# Patient Record
Sex: Female | Born: 1940 | Race: White | Hispanic: No | Marital: Married | Smoking: Former smoker
Health system: Southern US, Community
[De-identification: ages and names within clinical notes are randomized; demographics above are authoritative.]

## PROBLEM LIST (undated history)

## (undated) DIAGNOSIS — N289 Disorder of kidney and ureter, unspecified: Secondary | ICD-10-CM

## (undated) DIAGNOSIS — I509 Heart failure, unspecified: Secondary | ICD-10-CM

## (undated) DIAGNOSIS — L0390 Cellulitis, unspecified: Secondary | ICD-10-CM

## (undated) DIAGNOSIS — F329 Major depressive disorder, single episode, unspecified: Secondary | ICD-10-CM

## (undated) DIAGNOSIS — J189 Pneumonia, unspecified organism: Secondary | ICD-10-CM

## (undated) DIAGNOSIS — E611 Iron deficiency: Secondary | ICD-10-CM

## (undated) DIAGNOSIS — N183 Chronic kidney disease, stage 3 unspecified: Secondary | ICD-10-CM

## (undated) DIAGNOSIS — I209 Angina pectoris, unspecified: Secondary | ICD-10-CM

## (undated) DIAGNOSIS — R202 Paresthesia of skin: Secondary | ICD-10-CM

## (undated) DIAGNOSIS — R200 Anesthesia of skin: Secondary | ICD-10-CM

## (undated) DIAGNOSIS — Z936 Other artificial openings of urinary tract status: Secondary | ICD-10-CM

## (undated) DIAGNOSIS — R2681 Unsteadiness on feet: Secondary | ICD-10-CM

## (undated) DIAGNOSIS — R7881 Bacteremia: Secondary | ICD-10-CM

## (undated) DIAGNOSIS — D649 Anemia, unspecified: Secondary | ICD-10-CM

## (undated) DIAGNOSIS — N1330 Unspecified hydronephrosis: Secondary | ICD-10-CM

## (undated) DIAGNOSIS — R079 Chest pain, unspecified: Secondary | ICD-10-CM

## (undated) DIAGNOSIS — G8929 Other chronic pain: Secondary | ICD-10-CM

## (undated) DIAGNOSIS — R471 Dysarthria and anarthria: Secondary | ICD-10-CM

## (undated) DIAGNOSIS — E872 Acidosis, unspecified: Secondary | ICD-10-CM

## (undated) DIAGNOSIS — M1990 Unspecified osteoarthritis, unspecified site: Secondary | ICD-10-CM

## (undated) DIAGNOSIS — R4182 Altered mental status, unspecified: Secondary | ICD-10-CM

## (undated) DIAGNOSIS — R51 Headache: Secondary | ICD-10-CM

## (undated) DIAGNOSIS — I770 Arteriovenous fistula, acquired: Secondary | ICD-10-CM

## (undated) DIAGNOSIS — F0390 Unspecified dementia without behavioral disturbance: Secondary | ICD-10-CM

## (undated) DIAGNOSIS — K219 Gastro-esophageal reflux disease without esophagitis: Secondary | ICD-10-CM

## (undated) DIAGNOSIS — I10 Essential (primary) hypertension: Secondary | ICD-10-CM

## (undated) DIAGNOSIS — R296 Repeated falls: Secondary | ICD-10-CM

## (undated) DIAGNOSIS — R011 Cardiac murmur, unspecified: Secondary | ICD-10-CM

## (undated) DIAGNOSIS — I219 Acute myocardial infarction, unspecified: Secondary | ICD-10-CM

## (undated) DIAGNOSIS — F419 Anxiety disorder, unspecified: Secondary | ICD-10-CM

## (undated) DIAGNOSIS — R609 Edema, unspecified: Secondary | ICD-10-CM

## (undated) DIAGNOSIS — N2581 Secondary hyperparathyroidism of renal origin: Secondary | ICD-10-CM

## (undated) DIAGNOSIS — I499 Cardiac arrhythmia, unspecified: Secondary | ICD-10-CM

## (undated) DIAGNOSIS — N390 Urinary tract infection, site not specified: Secondary | ICD-10-CM

## (undated) DIAGNOSIS — Z8619 Personal history of other infectious and parasitic diseases: Secondary | ICD-10-CM

## (undated) DIAGNOSIS — Z905 Acquired absence of kidney: Secondary | ICD-10-CM

## (undated) DIAGNOSIS — M359 Systemic involvement of connective tissue, unspecified: Secondary | ICD-10-CM

## (undated) DIAGNOSIS — F32A Depression, unspecified: Secondary | ICD-10-CM

## (undated) HISTORY — PX: ABDOMINAL HYSTERECTOMY: SHX81

## (undated) HISTORY — PX: FRACTURE SURGERY: SHX138

## (undated) HISTORY — DX: Dysarthria and anarthria: R47.1

## (undated) HISTORY — PX: HIP ARTHROPLASTY: SHX981

## (undated) HISTORY — PX: TONSILLECTOMY AND ADENOIDECTOMY: SUR1326

## (undated) HISTORY — DX: Cellulitis, unspecified: L03.90

## (undated) HISTORY — DX: Arteriovenous fistula, acquired: I77.0

## (undated) HISTORY — DX: Acidosis: E87.2

## (undated) HISTORY — DX: Iron deficiency: E61.1

## (undated) HISTORY — DX: Gastro-esophageal reflux disease without esophagitis: K21.9

## (undated) HISTORY — DX: Altered mental status, unspecified: R41.82

## (undated) HISTORY — PX: DILATION AND CURETTAGE OF UTERUS: SHX78

## (undated) HISTORY — DX: Secondary hyperparathyroidism of renal origin: N25.81

## (undated) HISTORY — DX: Acidosis, unspecified: E87.20

## (undated) HISTORY — DX: Unspecified hydronephrosis: N13.30

## (undated) HISTORY — DX: Personal history of other infectious and parasitic diseases: Z86.19

## (undated) HISTORY — DX: Heart failure, unspecified: I50.9

## (undated) HISTORY — DX: Edema, unspecified: R60.9

## (undated) HISTORY — DX: Unsteadiness on feet: R26.81

## (undated) HISTORY — DX: Bacteremia: R78.81

## (undated) HISTORY — DX: Anemia, unspecified: D64.9

## (undated) HISTORY — DX: Urinary tract infection, site not specified: N39.0

## (undated) HISTORY — DX: Essential (primary) hypertension: I10

---

## 1968-06-25 HISTORY — PX: PARTIAL COLECTOMY: SHX5273

## 1969-02-23 HISTORY — PX: AV FISTULA PLACEMENT: SHX1204

## 1971-06-26 HISTORY — PX: CREATION / REVISION OF ILEOSTOMY / JEJUNOSTOMY: SUR354

## 1971-06-26 HISTORY — PX: NEPHRECTOMY: SHX65

## 1972-06-25 HISTORY — PX: BLADDER REMOVAL: SHX567

## 1998-06-25 HISTORY — PX: CHOLECYSTECTOMY: SHX55

## 1998-06-25 HISTORY — PX: APPENDECTOMY: SHX54

## 1999-02-23 ENCOUNTER — Ambulatory Visit (HOSPITAL_COMMUNITY): Admission: RE | Admit: 1999-02-23 | Discharge: 1999-02-23 | Payer: Self-pay | Admitting: Neurology

## 2001-07-31 ENCOUNTER — Inpatient Hospital Stay (HOSPITAL_COMMUNITY): Admission: EM | Admit: 2001-07-31 | Discharge: 2001-08-05 | Payer: Self-pay | Admitting: Emergency Medicine

## 2001-07-31 ENCOUNTER — Encounter: Payer: Self-pay | Admitting: Emergency Medicine

## 2001-08-05 ENCOUNTER — Encounter (INDEPENDENT_AMBULATORY_CARE_PROVIDER_SITE_OTHER): Payer: Self-pay | Admitting: Internal Medicine

## 2001-08-22 ENCOUNTER — Inpatient Hospital Stay (HOSPITAL_COMMUNITY): Admission: AD | Admit: 2001-08-22 | Discharge: 2001-08-27 | Payer: Self-pay | Admitting: Internal Medicine

## 2001-08-22 ENCOUNTER — Encounter (INDEPENDENT_AMBULATORY_CARE_PROVIDER_SITE_OTHER): Payer: Self-pay | Admitting: Internal Medicine

## 2001-09-08 ENCOUNTER — Inpatient Hospital Stay (HOSPITAL_COMMUNITY): Admission: EM | Admit: 2001-09-08 | Discharge: 2001-09-11 | Payer: Self-pay | Admitting: *Deleted

## 2001-11-02 ENCOUNTER — Encounter (INDEPENDENT_AMBULATORY_CARE_PROVIDER_SITE_OTHER): Payer: Self-pay | Admitting: Internal Medicine

## 2001-11-02 ENCOUNTER — Inpatient Hospital Stay (HOSPITAL_COMMUNITY): Admission: EM | Admit: 2001-11-02 | Discharge: 2001-11-04 | Payer: Self-pay | Admitting: Internal Medicine

## 2002-06-11 ENCOUNTER — Encounter (INDEPENDENT_AMBULATORY_CARE_PROVIDER_SITE_OTHER): Payer: Self-pay | Admitting: Internal Medicine

## 2002-06-11 ENCOUNTER — Ambulatory Visit (HOSPITAL_COMMUNITY): Admission: RE | Admit: 2002-06-11 | Discharge: 2002-06-11 | Payer: Self-pay | Admitting: Internal Medicine

## 2002-06-12 ENCOUNTER — Emergency Department (HOSPITAL_COMMUNITY): Admission: EM | Admit: 2002-06-12 | Discharge: 2002-06-12 | Payer: Self-pay | Admitting: Emergency Medicine

## 2002-09-18 ENCOUNTER — Inpatient Hospital Stay (HOSPITAL_COMMUNITY): Admission: EM | Admit: 2002-09-18 | Discharge: 2002-09-21 | Payer: Self-pay | Admitting: Internal Medicine

## 2002-09-18 ENCOUNTER — Encounter: Payer: Self-pay | Admitting: Internal Medicine

## 2002-09-21 ENCOUNTER — Encounter (INDEPENDENT_AMBULATORY_CARE_PROVIDER_SITE_OTHER): Payer: Self-pay | Admitting: Internal Medicine

## 2002-12-30 ENCOUNTER — Encounter: Payer: Self-pay | Admitting: Internal Medicine

## 2002-12-30 ENCOUNTER — Inpatient Hospital Stay (HOSPITAL_COMMUNITY): Admission: EM | Admit: 2002-12-30 | Discharge: 2003-01-02 | Payer: Self-pay | Admitting: Internal Medicine

## 2002-12-31 ENCOUNTER — Encounter (INDEPENDENT_AMBULATORY_CARE_PROVIDER_SITE_OTHER): Payer: Self-pay | Admitting: Internal Medicine

## 2003-01-01 ENCOUNTER — Encounter (INDEPENDENT_AMBULATORY_CARE_PROVIDER_SITE_OTHER): Payer: Self-pay | Admitting: Internal Medicine

## 2003-01-12 ENCOUNTER — Ambulatory Visit (HOSPITAL_COMMUNITY): Admission: RE | Admit: 2003-01-12 | Discharge: 2003-01-12 | Payer: Self-pay | Admitting: Urology

## 2003-01-12 ENCOUNTER — Encounter: Payer: Self-pay | Admitting: Urology

## 2003-04-11 ENCOUNTER — Encounter: Payer: Self-pay | Admitting: Emergency Medicine

## 2003-04-11 ENCOUNTER — Inpatient Hospital Stay (HOSPITAL_COMMUNITY): Admission: EM | Admit: 2003-04-11 | Discharge: 2003-04-17 | Payer: Self-pay | Admitting: Emergency Medicine

## 2004-04-04 ENCOUNTER — Inpatient Hospital Stay (HOSPITAL_COMMUNITY): Admission: EM | Admit: 2004-04-04 | Discharge: 2004-04-05 | Payer: Self-pay | Admitting: Emergency Medicine

## 2004-05-21 ENCOUNTER — Ambulatory Visit: Payer: Self-pay | Admitting: Internal Medicine

## 2004-05-21 ENCOUNTER — Inpatient Hospital Stay (HOSPITAL_COMMUNITY): Admission: EM | Admit: 2004-05-21 | Discharge: 2004-05-24 | Payer: Self-pay | Admitting: Emergency Medicine

## 2004-05-29 ENCOUNTER — Ambulatory Visit: Payer: Self-pay | Admitting: Internal Medicine

## 2004-08-10 ENCOUNTER — Ambulatory Visit: Payer: Self-pay | Admitting: Internal Medicine

## 2004-10-15 ENCOUNTER — Inpatient Hospital Stay (HOSPITAL_COMMUNITY): Admission: EM | Admit: 2004-10-15 | Discharge: 2004-10-17 | Payer: Self-pay | Admitting: Emergency Medicine

## 2004-10-15 ENCOUNTER — Ambulatory Visit: Payer: Self-pay | Admitting: Internal Medicine

## 2004-10-17 ENCOUNTER — Ambulatory Visit: Payer: Self-pay | Admitting: Gastroenterology

## 2004-11-22 ENCOUNTER — Ambulatory Visit: Payer: Self-pay | Admitting: Internal Medicine

## 2005-02-15 ENCOUNTER — Emergency Department (HOSPITAL_COMMUNITY): Admission: EM | Admit: 2005-02-15 | Discharge: 2005-02-15 | Payer: Self-pay | Admitting: Emergency Medicine

## 2005-02-22 ENCOUNTER — Ambulatory Visit: Payer: Self-pay | Admitting: Internal Medicine

## 2005-05-03 ENCOUNTER — Ambulatory Visit: Payer: Self-pay | Admitting: Internal Medicine

## 2005-06-04 ENCOUNTER — Ambulatory Visit: Payer: Self-pay | Admitting: Internal Medicine

## 2005-09-10 ENCOUNTER — Ambulatory Visit: Payer: Self-pay | Admitting: Internal Medicine

## 2005-12-13 ENCOUNTER — Ambulatory Visit: Payer: Self-pay | Admitting: Internal Medicine

## 2006-01-21 ENCOUNTER — Emergency Department (HOSPITAL_COMMUNITY): Admission: EM | Admit: 2006-01-21 | Discharge: 2006-01-21 | Payer: Self-pay | Admitting: Emergency Medicine

## 2006-02-28 ENCOUNTER — Emergency Department (HOSPITAL_COMMUNITY): Admission: EM | Admit: 2006-02-28 | Discharge: 2006-02-28 | Payer: Self-pay | Admitting: Emergency Medicine

## 2006-03-04 ENCOUNTER — Ambulatory Visit: Payer: Self-pay | Admitting: Internal Medicine

## 2006-03-05 ENCOUNTER — Ambulatory Visit: Payer: Self-pay | Admitting: Internal Medicine

## 2006-03-05 ENCOUNTER — Inpatient Hospital Stay (HOSPITAL_COMMUNITY): Admission: EM | Admit: 2006-03-05 | Discharge: 2006-03-09 | Payer: Self-pay | Admitting: Emergency Medicine

## 2006-04-24 ENCOUNTER — Ambulatory Visit: Payer: Self-pay | Admitting: Internal Medicine

## 2006-05-08 ENCOUNTER — Ambulatory Visit: Payer: Self-pay | Admitting: Internal Medicine

## 2006-06-27 ENCOUNTER — Ambulatory Visit (HOSPITAL_COMMUNITY): Admission: RE | Admit: 2006-06-27 | Discharge: 2006-06-27 | Payer: Self-pay | Admitting: Internal Medicine

## 2006-06-27 ENCOUNTER — Ambulatory Visit: Payer: Self-pay | Admitting: Internal Medicine

## 2006-09-27 ENCOUNTER — Ambulatory Visit: Payer: Self-pay | Admitting: Internal Medicine

## 2006-11-25 ENCOUNTER — Ambulatory Visit (HOSPITAL_COMMUNITY): Admission: RE | Admit: 2006-11-25 | Discharge: 2006-11-25 | Payer: Self-pay | Admitting: Obstetrics & Gynecology

## 2008-03-11 ENCOUNTER — Ambulatory Visit: Payer: Self-pay | Admitting: *Deleted

## 2008-03-15 ENCOUNTER — Ambulatory Visit: Payer: Self-pay | Admitting: *Deleted

## 2008-03-15 ENCOUNTER — Ambulatory Visit (HOSPITAL_COMMUNITY): Admission: RE | Admit: 2008-03-15 | Discharge: 2008-03-15 | Payer: Self-pay | Admitting: *Deleted

## 2008-04-15 ENCOUNTER — Ambulatory Visit: Payer: Self-pay | Admitting: *Deleted

## 2008-04-27 ENCOUNTER — Ambulatory Visit (HOSPITAL_COMMUNITY): Admission: RE | Admit: 2008-04-27 | Discharge: 2008-04-27 | Payer: Self-pay | Admitting: *Deleted

## 2008-04-27 ENCOUNTER — Ambulatory Visit: Payer: Self-pay | Admitting: *Deleted

## 2008-06-10 ENCOUNTER — Ambulatory Visit: Payer: Self-pay | Admitting: *Deleted

## 2009-12-15 ENCOUNTER — Inpatient Hospital Stay (HOSPITAL_COMMUNITY): Admission: EM | Admit: 2009-12-15 | Discharge: 2009-12-23 | Payer: Self-pay | Admitting: Internal Medicine

## 2010-01-05 ENCOUNTER — Emergency Department (HOSPITAL_COMMUNITY): Admission: EM | Admit: 2010-01-05 | Discharge: 2010-01-05 | Payer: Self-pay | Admitting: Emergency Medicine

## 2010-04-03 ENCOUNTER — Ambulatory Visit: Payer: Self-pay | Admitting: Internal Medicine

## 2010-05-17 ENCOUNTER — Ambulatory Visit: Payer: Self-pay | Admitting: Cardiovascular Disease

## 2010-05-17 ENCOUNTER — Inpatient Hospital Stay (HOSPITAL_COMMUNITY): Admission: EM | Admit: 2010-05-17 | Discharge: 2010-05-29 | Payer: Self-pay | Source: Home / Self Care

## 2010-05-22 DIAGNOSIS — F39 Unspecified mood [affective] disorder: Secondary | ICD-10-CM

## 2010-05-23 ENCOUNTER — Encounter (INDEPENDENT_AMBULATORY_CARE_PROVIDER_SITE_OTHER): Payer: Self-pay | Admitting: Internal Medicine

## 2010-06-25 DIAGNOSIS — R7881 Bacteremia: Secondary | ICD-10-CM

## 2010-06-25 DIAGNOSIS — B9620 Unspecified Escherichia coli [E. coli] as the cause of diseases classified elsewhere: Secondary | ICD-10-CM

## 2010-06-25 DIAGNOSIS — Z8619 Personal history of other infectious and parasitic diseases: Secondary | ICD-10-CM

## 2010-06-25 HISTORY — PX: OTHER SURGICAL HISTORY: SHX169

## 2010-06-25 HISTORY — DX: Personal history of other infectious and parasitic diseases: Z86.19

## 2010-06-25 HISTORY — DX: Unspecified Escherichia coli (E. coli) as the cause of diseases classified elsewhere: B96.20

## 2010-06-25 HISTORY — DX: Bacteremia: R78.81

## 2010-06-27 ENCOUNTER — Ambulatory Visit
Admission: RE | Admit: 2010-06-27 | Discharge: 2010-06-27 | Payer: Self-pay | Source: Home / Self Care | Attending: Internal Medicine | Admitting: Internal Medicine

## 2010-07-10 ENCOUNTER — Ambulatory Visit
Admission: RE | Admit: 2010-07-10 | Discharge: 2010-07-10 | Payer: Self-pay | Source: Home / Self Care | Attending: Internal Medicine | Admitting: Internal Medicine

## 2010-07-16 ENCOUNTER — Encounter (INDEPENDENT_AMBULATORY_CARE_PROVIDER_SITE_OTHER): Payer: Self-pay | Admitting: Internal Medicine

## 2010-07-17 ENCOUNTER — Inpatient Hospital Stay (HOSPITAL_COMMUNITY)
Admission: EM | Admit: 2010-07-17 | Discharge: 2010-08-08 | DRG: 469 | Disposition: A | Discharge status: Skilled Nursing Facility | Payer: Medicare Other | Source: Other Acute Inpatient Hospital | Attending: Internal Medicine | Admitting: Internal Medicine

## 2010-07-17 DIAGNOSIS — I517 Cardiomegaly: Secondary | ICD-10-CM | POA: Diagnosis present

## 2010-07-17 DIAGNOSIS — G9341 Metabolic encephalopathy: Secondary | ICD-10-CM | POA: Diagnosis present

## 2010-07-17 DIAGNOSIS — R404 Transient alteration of awareness: Secondary | ICD-10-CM | POA: Diagnosis present

## 2010-07-17 DIAGNOSIS — Z7982 Long term (current) use of aspirin: Secondary | ICD-10-CM

## 2010-07-17 DIAGNOSIS — F411 Generalized anxiety disorder: Secondary | ICD-10-CM | POA: Diagnosis present

## 2010-07-17 DIAGNOSIS — I2789 Other specified pulmonary heart diseases: Secondary | ICD-10-CM | POA: Diagnosis present

## 2010-07-17 DIAGNOSIS — R269 Unspecified abnormalities of gait and mobility: Secondary | ICD-10-CM | POA: Diagnosis present

## 2010-07-17 DIAGNOSIS — S72033A Displaced midcervical fracture of unspecified femur, initial encounter for closed fracture: Principal | ICD-10-CM | POA: Diagnosis present

## 2010-07-17 DIAGNOSIS — D631 Anemia in chronic kidney disease: Secondary | ICD-10-CM | POA: Diagnosis present

## 2010-07-17 DIAGNOSIS — D72829 Elevated white blood cell count, unspecified: Secondary | ICD-10-CM | POA: Diagnosis present

## 2010-07-17 DIAGNOSIS — R197 Diarrhea, unspecified: Secondary | ICD-10-CM | POA: Diagnosis not present

## 2010-07-17 DIAGNOSIS — Z87440 Personal history of urinary (tract) infections: Secondary | ICD-10-CM

## 2010-07-17 DIAGNOSIS — N179 Acute kidney failure, unspecified: Secondary | ICD-10-CM | POA: Diagnosis not present

## 2010-07-17 DIAGNOSIS — F329 Major depressive disorder, single episode, unspecified: Secondary | ICD-10-CM | POA: Diagnosis present

## 2010-07-17 DIAGNOSIS — M810 Age-related osteoporosis without current pathological fracture: Secondary | ICD-10-CM | POA: Diagnosis present

## 2010-07-17 DIAGNOSIS — R1310 Dysphagia, unspecified: Secondary | ICD-10-CM | POA: Diagnosis present

## 2010-07-17 DIAGNOSIS — W19XXXA Unspecified fall, initial encounter: Secondary | ICD-10-CM | POA: Diagnosis present

## 2010-07-17 DIAGNOSIS — N183 Chronic kidney disease, stage 3 unspecified: Secondary | ICD-10-CM | POA: Diagnosis present

## 2010-07-17 DIAGNOSIS — J690 Pneumonitis due to inhalation of food and vomit: Secondary | ICD-10-CM | POA: Diagnosis not present

## 2010-07-17 DIAGNOSIS — E870 Hyperosmolality and hypernatremia: Secondary | ICD-10-CM | POA: Diagnosis not present

## 2010-07-17 DIAGNOSIS — G589 Mononeuropathy, unspecified: Secondary | ICD-10-CM | POA: Diagnosis present

## 2010-07-17 DIAGNOSIS — K219 Gastro-esophageal reflux disease without esophagitis: Secondary | ICD-10-CM | POA: Diagnosis present

## 2010-07-17 DIAGNOSIS — I428 Other cardiomyopathies: Secondary | ICD-10-CM | POA: Diagnosis not present

## 2010-07-17 DIAGNOSIS — I5021 Acute systolic (congestive) heart failure: Secondary | ICD-10-CM | POA: Diagnosis not present

## 2010-07-17 DIAGNOSIS — E872 Acidosis, unspecified: Secondary | ICD-10-CM | POA: Diagnosis present

## 2010-07-17 DIAGNOSIS — Z66 Do not resuscitate: Secondary | ICD-10-CM | POA: Diagnosis present

## 2010-07-17 DIAGNOSIS — I509 Heart failure, unspecified: Secondary | ICD-10-CM | POA: Diagnosis not present

## 2010-07-17 DIAGNOSIS — R5381 Other malaise: Secondary | ICD-10-CM | POA: Diagnosis present

## 2010-07-17 DIAGNOSIS — Z905 Acquired absence of kidney: Secondary | ICD-10-CM

## 2010-07-17 DIAGNOSIS — R109 Unspecified abdominal pain: Secondary | ICD-10-CM | POA: Diagnosis present

## 2010-07-17 DIAGNOSIS — I079 Rheumatic tricuspid valve disease, unspecified: Secondary | ICD-10-CM | POA: Diagnosis present

## 2010-07-17 DIAGNOSIS — E875 Hyperkalemia: Secondary | ICD-10-CM | POA: Diagnosis present

## 2010-07-17 DIAGNOSIS — A4151 Sepsis due to Escherichia coli [E. coli]: Secondary | ICD-10-CM | POA: Diagnosis present

## 2010-07-17 DIAGNOSIS — I129 Hypertensive chronic kidney disease with stage 1 through stage 4 chronic kidney disease, or unspecified chronic kidney disease: Secondary | ICD-10-CM | POA: Diagnosis present

## 2010-07-17 DIAGNOSIS — N390 Urinary tract infection, site not specified: Secondary | ICD-10-CM | POA: Diagnosis present

## 2010-07-17 DIAGNOSIS — A419 Sepsis, unspecified organism: Secondary | ICD-10-CM | POA: Diagnosis present

## 2010-07-17 DIAGNOSIS — R55 Syncope and collapse: Secondary | ICD-10-CM | POA: Diagnosis present

## 2010-07-17 DIAGNOSIS — F17200 Nicotine dependence, unspecified, uncomplicated: Secondary | ICD-10-CM | POA: Diagnosis present

## 2010-07-17 DIAGNOSIS — Z681 Body mass index (BMI) 19 or less, adult: Secondary | ICD-10-CM

## 2010-07-17 DIAGNOSIS — F3289 Other specified depressive episodes: Secondary | ICD-10-CM | POA: Diagnosis present

## 2010-07-17 DIAGNOSIS — I4891 Unspecified atrial fibrillation: Secondary | ICD-10-CM | POA: Diagnosis not present

## 2010-07-17 DIAGNOSIS — D696 Thrombocytopenia, unspecified: Secondary | ICD-10-CM | POA: Diagnosis not present

## 2010-07-17 DIAGNOSIS — G8929 Other chronic pain: Secondary | ICD-10-CM | POA: Diagnosis present

## 2010-07-17 DIAGNOSIS — I059 Rheumatic mitral valve disease, unspecified: Secondary | ICD-10-CM | POA: Diagnosis present

## 2010-07-17 NOTE — Consult Note (Signed)
NAMEJURNEY, [name] NO.:  [id number]  MEDICAL RECORD NO.:  [id number]          PATIENT TYPE:  INP  LOCATION:  2038                         FACILITY:  [facility]  PHYSICIAN:  Lionel December, M.D.    DATE OF BIRTH:  19-Oct-1940  DATE OF CONSULTATION:  07/10/2010 DATE OF DISCHARGE:  05/29/2010                                CONSULTATION   PRESENTING COMPLAINT:  Followup for multiple problems.  Last seen on June 27, 2010.  SUBJECTIVE:  This 70 year old Caucasian female who is here for scheduled visit accompanied by her husband [name].  She had a 12-day hospitalization at [facility] between November 23 and December 5 and then she was at [facility] and she just went home about 2 weeks ago.  The details of that admission are summarized in my last note and will not be repeated.  She states that she is doing better.  Her major complaint is that she has discomfort and swelling in her feet and legs.  She states this is up and down and not necessarily getting worse.  She has occasional nausea, but no vomiting.  She may have intermittent heartburn.  She states her appetite is good.  Her husband states that she eats 2 good meals and multiple snacks during the daytime.  Her bowels are moving every day.  She denies melena or rectal bleeding.  She has an office visit with Dr. Caryn Section on July 18, 2010.  She is getting physical therapy at home twice weekly and also getting help from home health care nurse.  Her husband states that there are just tired.  They have been going to clinics and hospital too frequently and they just need a break.  He states that when she is at home, she does well.  She is also having some difficulty with ambulation.  He states her balance has gotten better, although she tends to drift on her right site.  She is using cane to walk around.  CURRENT MEDICATIONS: 1. KCl 20 mEq daily. 2. Furosemide 40 mg p.o. daily. 3. Atenolol 25 mg daily. 4. Doxepin 100 mg  p.o. nightly p.r.n. 5. Cardizem 120 mg daily. 6. Omeprazole 20 mg b.i.d. 7. Alprazolam 0.5 mg q.i.d. 8. Oxycodone 5 mg b.i.d. p.r.n. 9. Since her last visit she only taken 6 fentanyl patch 25 mcg prior     to skin change every 72 hours. 10.ASA 81 mg daily. 11.Ferrous sulfate 325 mg daily. 12.Folic acid 800 mcg every other day times p.r.n. 13.MiraLax 17 g p.r.n. 14.Vitamin D3 1000 units daily. 15.Stool softener daily.  OBJECTIVE:  VITAL SIGNS:  Weight 113.3 pounds which is down by 2 pounds since the last visit, she is 65-1/2 inches tall, pulse 68 per minute, blood pressure 112/70, temp is 97.6. GENERAL:  She appears to be comfortable in chair. EYES:  Conjunctivae are pink.  Sclerae are nonicteric.  Pupils are equal and reactive to light and no nystagmus noted.  EOMI intact.  MOUTH: Oropharyngeal mucosa is normal. NECK:  No neck masses are noted. LUNGS:  Clear to auscultation. ABDOMEN:  Flat and soft.  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  Her abdomen is soft.  She has extensive scarring in her left lower quadrant.  She has trace to 1+ pitting edema involving her feet, ankles and distal legs.  LABORATORY DATA:  From July 07, 2010; sodium 132, potassium 3.3, chloride 96, CO2 24, BUN is 30, creatinine 2.17, calcium 8.8, phosphorus 3.7, uric acid is 8.8, albumin 3.5.  Serum iron 22, TIBC 332, saturation is 7% and hemoglobin is 11.1 g.  ASSESSMENT: 1. [name] is gradually getting her strength back.  She has not had any     problems with nausea, vomiting or diminished intake since her last     visit.  Her lower extremity edema remains unchanged.  This is felt     to be due to venous incompetence.  We tried to treat her     aggressively with diuretic therapy.  We could make her renal     failure worse. 2. She may also have peripheral neuropathy. 3. She is having some gait problems.  She had unenhanced brain CT at     [facility] which was negative.  She could  be further evaluated with MRI     of her brain or neurologic consultation, but she and her husband     want to wait.  Her strength in upper extremity both proximally and     distally is normal and she has 4/5 strength in her lower     extremities, but no asymmetry noted. 4. Mild hyperkalemia. 5. Mild anemia with iron studies suggesting iron deficiency.  She is     on iron therapy.  This is probably due to impaired iron absorption     since she is chronically acid suppressive with PPIs.  She has not     had screening colonoscopy in several years, but she may consider     having this sometime this year. 6. Hyperuricemia.  We will get Dr. Caryn Section on how she needs to be treated.  RECOMMENDATIONS: 1. Take KCl 20 mEq daily for 3 days and thereafter only on days she     takes Lasix.  Written instructions were provided. 2. She will continue oral iron therapy. 3. She will have CBC and a BMET in 4 weeks and return for OV in 8     weeks.     Lionel December, M.D.     NR/MEDQ  D:  07/11/2010  T:  07/11/2010  Job:  [id number]  Electronically Signed by Lionel December M.D. on 07/17/2010 11:12:23 AM

## 2010-07-18 LAB — CBC
MCHC: 32.9 g/dL (ref 30.0–36.0)
MCV: 95.9 fL (ref 78.0–100.0)
Platelets: 229 10*3/uL (ref 150–400)
RBC: 3.14 MIL/uL — ABNORMAL LOW (ref 3.87–5.11)
RDW: 14.1 % (ref 11.5–15.5)
WBC: 18.7 10*3/uL — ABNORMAL HIGH (ref 4.0–10.5)

## 2010-07-19 LAB — SEDIMENTATION RATE: Sed Rate: 47 mm/hr — ABNORMAL HIGH (ref 0–22)

## 2010-07-19 LAB — COMPREHENSIVE METABOLIC PANEL
BUN: 47 mg/dL — ABNORMAL HIGH (ref 6–23)
CO2: 19 mEq/L (ref 19–32)
Calcium: 8.1 mg/dL — ABNORMAL LOW (ref 8.4–10.5)
Glucose, Bld: 127 mg/dL — ABNORMAL HIGH (ref 70–99)
Total Bilirubin: 0.8 mg/dL (ref 0.3–1.2)

## 2010-07-19 LAB — RENAL FUNCTION PANEL
Albumin: 2.3 g/dL — ABNORMAL LOW (ref 3.5–5.2)
BUN: 55 mg/dL — ABNORMAL HIGH (ref 6–23)
Calcium: 7.7 mg/dL — ABNORMAL LOW (ref 8.4–10.5)
Creatinine, Ser: 2.82 mg/dL — ABNORMAL HIGH (ref 0.4–1.2)
Phosphorus: 5.3 mg/dL — ABNORMAL HIGH (ref 2.3–4.6)
Potassium: 5.1 mEq/L (ref 3.5–5.1)

## 2010-07-19 LAB — IRON AND TIBC
Iron: <10 ug/dL — ABNORMAL LOW (ref 42–135)
UIBC: 213 ug/dL

## 2010-07-19 LAB — CBC
MCH: 30 pg (ref 26.0–34.0)
MCV: 90.9 fL (ref 78.0–100.0)
Platelets: 181 10*3/uL (ref 150–400)
RDW: 18.4 % — ABNORMAL HIGH (ref 11.5–15.5)
WBC: 16.9 10*3/uL — ABNORMAL HIGH (ref 4.0–10.5)

## 2010-07-19 LAB — CROSSMATCH
ABO/RH(D): B POS
Antibody Screen: NEGATIVE

## 2010-07-19 LAB — URINALYSIS, MICROSCOPIC ONLY
Nitrite: POSITIVE — AB
Specific Gravity, Urine: 1.012 (ref 1.005–1.030)
Urobilinogen, UA: 0.2 mg/dL (ref 0.0–1.0)
pH: 6 (ref 5.0–8.0)

## 2010-07-19 LAB — ANA: Anti Nuclear Antibody(ANA): NEGATIVE

## 2010-07-19 LAB — BASIC METABOLIC PANEL
BUN: 54 mg/dL — ABNORMAL HIGH (ref 6–23)
Creatinine, Ser: 2.86 mg/dL — ABNORMAL HIGH (ref 0.4–1.2)
GFR calc non Af Amer: 16 mL/min — ABNORMAL LOW (ref >60)

## 2010-07-19 LAB — RAPID URINE DRUG SCREEN, HOSP PERFORMED
Barbiturates: NOT DETECTED
Benzodiazepines: POSITIVE — AB
Cocaine: NOT DETECTED
Opiates: POSITIVE — AB
Tetrahydrocannabinol: NOT DETECTED

## 2010-07-19 LAB — VITAMIN B12: Vitamin B-12: 292 pg/mL (ref 211–911)

## 2010-07-19 LAB — PROCALCITONIN: Procalcitonin: <0.1 ng/mL

## 2010-07-19 LAB — LACTIC ACID, PLASMA: Lactic Acid, Venous: 2.7 mmol/L — ABNORMAL HIGH (ref 0.5–2.2)

## 2010-07-19 LAB — URINE CULTURE: Colony Count: >=100000

## 2010-07-19 LAB — POCT I-STAT 4, (NA,K, GLUC, HGB,HCT)
HCT: 31 % — ABNORMAL LOW (ref 36.0–46.0)
Hemoglobin: 10.5 g/dL — ABNORMAL LOW (ref 12.0–15.0)
Potassium: 5.8 mEq/L — ABNORMAL HIGH (ref 3.5–5.1)
Sodium: 144 mEq/L (ref 135–145)

## 2010-07-20 LAB — CBC
MCV: 89.9 fL (ref 78.0–100.0)
Platelets: 145 10*3/uL — ABNORMAL LOW (ref 150–400)
RBC: 3.37 MIL/uL — ABNORMAL LOW (ref 3.87–5.11)
WBC: 10.9 10*3/uL — ABNORMAL HIGH (ref 4.0–10.5)

## 2010-07-20 LAB — POCT I-STAT 3, ART BLOOD GAS (G3+)
Acid-base deficit: 8 mmol/L — ABNORMAL HIGH (ref 0.0–2.0)
O2 Saturation: 92 %
Patient temperature: 98.4

## 2010-07-20 LAB — DIFFERENTIAL
Basophils Absolute: 0 10*3/uL (ref 0.0–0.1)
Eosinophils Absolute: 0 10*3/uL (ref 0.0–0.7)
Lymphocytes Relative: 9 % — ABNORMAL LOW (ref 12–46)
Lymphs Abs: 1 10*3/uL (ref 0.7–4.0)
Neutrophils Relative %: 79 % — ABNORMAL HIGH (ref 43–77)

## 2010-07-20 LAB — BASIC METABOLIC PANEL
BUN: 53 mg/dL — ABNORMAL HIGH (ref 6–23)
CO2: 18 mEq/L — ABNORMAL LOW (ref 19–32)
Calcium: 8 mg/dL — ABNORMAL LOW (ref 8.4–10.5)
Creatinine, Ser: 2.97 mg/dL — ABNORMAL HIGH (ref 0.4–1.2)
GFR calc non Af Amer: 16 mL/min — ABNORMAL LOW (ref >60)
Glucose, Bld: 127 mg/dL — ABNORMAL HIGH (ref 70–99)
Sodium: 151 mEq/L — ABNORMAL HIGH (ref 135–145)

## 2010-07-20 LAB — COMPREHENSIVE METABOLIC PANEL
ALT: 19 U/L (ref 0–35)
AST: 34 U/L (ref 0–37)
Albumin: 2.2 g/dL — ABNORMAL LOW (ref 3.5–5.2)
Alkaline Phosphatase: 88 U/L (ref 39–117)
BUN: 54 mg/dL — ABNORMAL HIGH (ref 6–23)
Chloride: 122 mEq/L — ABNORMAL HIGH (ref 96–112)
Potassium: 4.6 mEq/L (ref 3.5–5.1)
Sodium: 150 mEq/L — ABNORMAL HIGH (ref 135–145)
Total Bilirubin: 0.8 mg/dL (ref 0.3–1.2)

## 2010-07-20 NOTE — Consult Note (Addendum)
[name], [name] NO.:  [id number]  MEDICAL RECORD NO.:  [id number]          PATIENT TYPE:  INP  LOCATION:  3309                         FACILITY:  [facility]  PHYSICIAN:  Eulas Post, MD    DATE OF BIRTH:  1941/03/24  DATE OF CONSULTATION:  07/17/2010 DATE OF DISCHARGE:                                CONSULTATION   TIME OF CONSULTATION:  10 p.m.  HISTORY:  [name] is a 70 year old woman who has multiple medical problems including chronic kidney disease and was recently hospitalized with what sounds like a Tylenol overdose secondary to Vicodin use.  She has chronic abdominal pain as well and was recently started on a fentanyl patch, which apparently was helping with her pain. Nevertheless, on July 16, 2010, she apparently blacked out and fell and broke her left hip.  She was brought to [facility] and apparently, they were not able to obtain an orthopedic surgeon, and he was apparently stuck out of town, and the family requested transfer to [facility] for management.  [name] has had recent worsening mental status periodically, and in fact has had substantial mental status changes over the past 24 hours.  Not exactly clear the etiology of this, but the family says that she had a similar mental status change prior to her most recent hospitalization. She complains of severe pain with wincing and grimacing around the left hip with any type of movement or activity.  Resting it makes it better. She was also given a dose of Dilaudid 0.5 mg, which also helped.  PAST HISTORY:  Significant for: 1. Chronic kidney disease. 2. Recurrent urinary tract infection. 3. History of a nephrectomy. 4. Reflux disease. 5. History of bowel surgery. 6. Osteoporosis. 7. Tricuspid regurg. 8. History of Tylenol overdose secondary to Vicodin use. 9. Chronic abdominal pain.  FAMILY HISTORY:  Apparently, her father died when she was only 43 years old, and her  mother died of COPD or at least had COPD.  The family history, social history, review of systems were obtained according to the family, which are at the bedside.  SOCIAL HISTORY:  She does smoke occasionally, and used to smoke a much more considerably.  She was recently able to get home after an extended rehab stay from her most recent hospitalization over the past couple of months.  REVIEW OF SYSTEMS:  Significant for recent confusion, mental status changes, periodic blackouts, chronic urinary tract infections, and multiple medical issues that have been ongoing as indicated above.  All other systems were reviewed and were negative with the exception of those included in the history present illness and otherwise.  PHYSICAL EXAMINATION:  GENERAL:  Crystle is lying in bed, and does respond to voice, although she is confused and lethargic and does not open her eyes very well to command. HEENT:  Her extraocular movements seem to be intact, although she does not really follow commands.  She does have pinpoint pupils.  I do not appreciate any axillary or cervical lymphadenopathy. HEART:  She does have a regular rate and rhythm and has minimal pedal edema.  She does not have any  labored breathing. GI:  Her abdomen is soft and does not have any rebound or guarding. PSYCHIATRIC:  She is not competent for consent, but her durable power of attorney is present.  This is [name], telephone [phone]. SKIN:  She has no skin breaks over the left hip. MUSCULOSKELETAL:  She is having some type of a jerking activity that is periodic, and not consistent with mere pain response, but rather possibly metabolic in etiology.  This is almost like what I have seen before in patients with substantial liver dysfunction.  Her left hip is short and externally rotated and has severe pain with any motion there. It is very difficult for me to get much of a motor exam in her lower extremities except to say that her  toes do flicker with dorsiflexion and plantar flexion, although she will not follow commands to actively dorsiflex the ankle.  Nevertheless, I do think that her neurologic function is intact from a sciatic nerve standpoint. Her calves are soft.  Her x-rays taken from an outside institution demonstrate substantial displacement of a basicervical femoral neck fracture.  IMPRESSION:  Acute left basicervical femoral neck fracture in the setting of acute-on-chronic renal failure with recurrent urinary tract infection, as well as acute mental status changes.  PLAN:  We are trying to minimize her narcotic use at the current time in order to try and get a better baseline of where she actually is.  I do not think that her mental status is secondary to narcotic utilization, and in fact is probably more likely secondary to electrolyte disturbance and metabolic abnormalities.  She is extremely high risk when considering of surgical intervention, and carries a very significant mortality over the next 6 months to year.  Nevertheless, for pain control reasons, as well as in order to regain any type of ambulatory function.  I have recommended a left hip hemiarthroplasty.  I discussed the options of internal fixation versus nonsurgical management versus hemiarthroplasty with the family, and recommended hemiarthroplasty.  The risks, benefits, and alternatives were discussed at length including the risks of mortality, leg length discrepancy, dislocation, fracture, blood clots, blood transfusion, cardiopulmonary complications, among others, and they would like to proceed.  We are planning on optimizing her medically prior to proceeding, and depending on the timing, we will perform her operation either tomorrow, if she has been optimized, or further in the future depending on her medical status.  Thank you for this consultation, and I will try and assist in any way possible to get Myca through this very  difficult issue.     Eulas Post, MD     JPL/MEDQ  D:  07/17/2010  T:  07/18/2010  Job:  [id number]  cc:   Wilber Bihari. Caryn Section, M.D. Lionel December, M.D.  Electronically Signed by Teryl Lucy MD on 07/20/2010 05:31:15 PM

## 2010-07-20 NOTE — Op Note (Addendum)
NAMEALYANAH, ELLIOTT NO.:  [id number]  MEDICAL RECORD NO.:  [id number]          PATIENT TYPE:  INP  LOCATION:  2102                         FACILITY:  [facility]  PHYSICIAN:  Eulas Post, MD    DATE OF BIRTH:  [birth year]  DATE OF PROCEDURE:  07/18/2010 DATE OF DISCHARGE:                              OPERATIVE REPORT   PREOPERATIVE DIAGNOSIS:  Left basicervical femoral neck fracture.  POSTOPERATIVE DIAGNOSIS:  Left basicervical femoral neck fracture.  OPERATIVE PROCEDURE:  Left hip hemiarthroplasty.  OPERATIVE IMPLANTS:  DePuy AML diaphyseal press-fit, size 15 mm with a - 3 femoral neck and a size 48 fracture head hip ball, unipolar.  ATTENDING SURGEON:  Eulas Post, MD  FIRST ASSISTANT:  Laural Benes. Su Hilt, [place]  PREOPERATIVE INDICATIONS:  Mrs. Lashona Schaaf is a 70 year old woman who has end-stage renal disease and is status post nephrectomy who fell and had a left basicervical femoral neck fracture.  She had severe pain and was unable to walk.  She also had mental status changes.  She was transferred from [facility] because they apparently did not have an orthopedic surgeon who was available.  She was optimized medically and then the family elected for her to undergo left hip hemiarthroplasty.  The risks, benefits, and alternatives were discussed before the operation including not limited to risks of infection, bleeding, nerve injury, leg length discrepancy, dislocation, blood clots, blood transfusion, periprosthetic fracture, cardiopulmonary complications, among others and they are willing to proceed.  OPERATIVE FINDINGS:  The entire proximal femur had basically been blown apart.  The greater trochanter was fractured and was a separate piece. The basicervical femoral neck was also a separate piece.  The fracture extended to just above the level of the lesser trochanter.  This was almost an intertrochanteric fracture, and I had considered  treating it with the femoral nail, however, I was concerned given the degree of involvement of the femoral neck as well as her risk factors for healing, that this would not provide a stable platform for her in order to bear weight on immediately, and that given her high mortality risk that an arthroplasty would provide a more stable option.  For these reasons, I elected to perform an arthroplasty rather than an open reduction and internal fixation.  OPERATIVE PROCEDURE:  The patient was brought to the operating room and placed in a supine position.  IV Rocephin had been given, and she also has concomitant urinary tract infection.  This is somewhat chronic. After general anesthesia was administered.  She was turned into the lateral decubitus position with the left hip towards the ceiling.  Left lower extremity was prepped and draped in the usual sterile fashion. Posterolateral approach to the hip was performed.  The capsule was identified.  The entire posterolateral aspect of the proximal femur had been broken and were in crumbles such the posterior capsule and the posterior external rotators did not actually attach to anything other than crumbles of bone.  These were identified intact with a stitch.  I then exposed the fracture and the femoral head and removed it from the acetabulum.  This  was somewhat difficult given the size of the neck segment.  Once the acetabulum was cleaned, I then made sure there was no loose debris and then exposed the proximal femur.  I used the canal finder and I toyed with the idea of cementing in a prosthesis proud, however, the proximal femur was not present, such that I would have had limited cement hold distally, probably even less than half of the prosthesis, and would not have had adequate support.  For these reasons, I elected to place an AML press-fit.  I had also toyed with the idea of using a calcar replacing hemiarthroplasty, however, this  instrumentation was not available.  For these reasons, I selected a AML press-fit and then sequentially reamed the distal canal until I got the appropriate fit.  I tried to use a trial, but I did not have any rotational stability given the lack of proximal femur and so therefore placed the real prosthesis into the appropriate version.  A trial with a -3 as well as with a neutral, and with a -3, it had a better restoration of leg length and appropriate soft tissue tension.  I took an intraoperative x-ray that demonstrated that the -3 had appropriate restoration of leg length and so therefore that prosthesis was selected. I was able to use the zero, although this was a fairly tight and was not as natural of a feel as the -3.  For these reasons, the -3 was used and I loaded two #2 FiberWire through the hole within the prosthesis, in order to repair the greater trochanter back to the prosthesis.  I performed a greater trochanteric repair in a similar fashion to a tuberosity repair as if it were a proximal humerus.  I placed bone graft underneath the trochanter and I used two #2 FiberWire in a side-to-side fashion to repair it directly to the remaining trochanter, and then I had two #2 FiberWire passed through the hole in the prosthesis that was repaired in a cerclage fashion around the fragment, and then I also passed the posterior capsule and external rotator stitches through the entire construct repairing the posterior tissue back to the posterior femur.  This was an unusual repair, but this was what was required given the fracture pattern.  I irrigated the wounds copiously and then I repaired the interval split within the capsule tissue with Vicryl.  I repaired the fascia also with #1 Vicryl followed by 2-0 and 4-0 Monocryl for the skin.  Steri-Strips and sterile gauze was applied.  The patient was awakened and returned to the PACU in stable and satisfactory condition.  Estimated blood loss  was about 550 mL, and she did receive 2 units of packed red blood cells intraoperatively.  She tolerated the procedure well and there were no complications.  Skip Mayer, PA-C was present and scrubbed throughout the case and was critical for retraction as well as exposure and reduction and dislocation of the hip during the procedure.     Eulas Post, MD     JPL/MEDQ  D:  07/19/2010  T:  07/19/2010  Job:  [id number]  cc:   Verne Carrow. Caryn Section, M.D.  Electronically Signed by Teryl Lucy MD on 07/20/2010 05:31:17 PM

## 2010-07-21 LAB — URINALYSIS, ROUTINE W REFLEX MICROSCOPIC
Bilirubin Urine: NEGATIVE
Specific Gravity, Urine: 1.015 (ref 1.005–1.030)
Urine Glucose, Fasting: NEGATIVE mg/dL
pH: 6.5 (ref 5.0–8.0)

## 2010-07-21 LAB — CBC
HCT: 28.4 % — ABNORMAL LOW (ref 36.0–46.0)
MCHC: 33.1 g/dL (ref 30.0–36.0)
MCV: 90.2 fL (ref 78.0–100.0)
Platelets: 118 10*3/uL — ABNORMAL LOW (ref 150–400)
RDW: 17 % — ABNORMAL HIGH (ref 11.5–15.5)
WBC: 7.2 10*3/uL (ref 4.0–10.5)

## 2010-07-21 LAB — BASIC METABOLIC PANEL
CO2: 18 mEq/L — ABNORMAL LOW (ref 19–32)
Chloride: 123 mEq/L — ABNORMAL HIGH (ref 96–112)
GFR calc Af Amer: 20 mL/min — ABNORMAL LOW (ref >60)
Glucose, Bld: 162 mg/dL — ABNORMAL HIGH (ref 70–99)
Sodium: 151 mEq/L — ABNORMAL HIGH (ref 135–145)

## 2010-07-21 LAB — URINE MICROSCOPIC-ADD ON

## 2010-07-21 LAB — COMPREHENSIVE METABOLIC PANEL
Albumin: 2.2 g/dL — ABNORMAL LOW (ref 3.5–5.2)
Alkaline Phosphatase: 60 U/L (ref 39–117)
BUN: 60 mg/dL — ABNORMAL HIGH (ref 6–23)
Calcium: 7.8 mg/dL — ABNORMAL LOW (ref 8.4–10.5)
Glucose, Bld: 99 mg/dL (ref 70–99)
Potassium: 4.1 mEq/L (ref 3.5–5.1)
Sodium: 156 mEq/L — ABNORMAL HIGH (ref 135–145)
Total Protein: 5.5 g/dL — ABNORMAL LOW (ref 6.0–8.3)

## 2010-07-21 LAB — DIFFERENTIAL
Basophils Absolute: 0 10*3/uL (ref 0.0–0.1)
Eosinophils Absolute: 0 10*3/uL (ref 0.0–0.7)
Eosinophils Relative: 0 % (ref 0–5)
Monocytes Absolute: 0.8 10*3/uL (ref 0.1–1.0)

## 2010-07-22 LAB — BASIC METABOLIC PANEL
Calcium: 7.9 mg/dL — ABNORMAL LOW (ref 8.4–10.5)
GFR calc Af Amer: 22 mL/min — ABNORMAL LOW (ref >60)
GFR calc non Af Amer: 18 mL/min — ABNORMAL LOW (ref >60)
Glucose, Bld: 154 mg/dL — ABNORMAL HIGH (ref 70–99)
Sodium: 147 mEq/L — ABNORMAL HIGH (ref 135–145)

## 2010-07-22 LAB — CBC
HCT: 27.9 % — ABNORMAL LOW (ref 36.0–46.0)
MCHC: 32.3 g/dL (ref 30.0–36.0)
Platelets: 118 10*3/uL — ABNORMAL LOW (ref 150–400)
RDW: 16.7 % — ABNORMAL HIGH (ref 11.5–15.5)
WBC: 9.9 10*3/uL (ref 4.0–10.5)

## 2010-07-22 LAB — POCT I-STAT 3, ART BLOOD GAS (G3+)
Acid-base deficit: 7 mmol/L — ABNORMAL HIGH (ref 0.0–2.0)
Patient temperature: 99.1
pH, Arterial: 7.375 (ref 7.350–7.400)
pO2, Arterial: 45 mmHg — ABNORMAL LOW (ref 80.0–100.0)

## 2010-07-23 LAB — BASIC METABOLIC PANEL
CO2: 23 mEq/L (ref 19–32)
Calcium: 8 mg/dL — ABNORMAL LOW (ref 8.4–10.5)
GFR calc Af Amer: 23 mL/min — ABNORMAL LOW (ref >60)
GFR calc non Af Amer: 19 mL/min — ABNORMAL LOW (ref >60)
Glucose, Bld: 110 mg/dL — ABNORMAL HIGH (ref 70–99)
Potassium: 3 mEq/L — ABNORMAL LOW (ref 3.5–5.1)
Sodium: 148 mEq/L — ABNORMAL HIGH (ref 135–145)

## 2010-07-23 LAB — CBC
Hemoglobin: 10.3 g/dL — ABNORMAL LOW (ref 12.0–15.0)
MCHC: 32.3 g/dL (ref 30.0–36.0)
RDW: 16 % — ABNORMAL HIGH (ref 11.5–15.5)
WBC: 14.5 10*3/uL — ABNORMAL HIGH (ref 4.0–10.5)

## 2010-07-23 LAB — PHOSPHORUS: Phosphorus: 3.3 mg/dL (ref 2.3–4.6)

## 2010-07-24 LAB — CULTURE, BLOOD (ROUTINE X 2): Culture: NO GROWTH

## 2010-07-24 LAB — BASIC METABOLIC PANEL
Chloride: 115 mEq/L — ABNORMAL HIGH (ref 96–112)
GFR calc non Af Amer: 20 mL/min — ABNORMAL LOW (ref >60)
Potassium: 3.6 mEq/L (ref 3.5–5.1)
Sodium: 154 mEq/L — ABNORMAL HIGH (ref 135–145)

## 2010-07-24 LAB — CBC
MCV: 91.4 fL (ref 78.0–100.0)
Platelets: 216 10*3/uL (ref 150–400)
RBC: 3.48 MIL/uL — ABNORMAL LOW (ref 3.87–5.11)
RDW: 16 % — ABNORMAL HIGH (ref 11.5–15.5)
WBC: 11.9 10*3/uL — ABNORMAL HIGH (ref 4.0–10.5)

## 2010-07-24 LAB — PHOSPHORUS: Phosphorus: 3.1 mg/dL (ref 2.3–4.6)

## 2010-07-25 LAB — BASIC METABOLIC PANEL
CO2: 29 mEq/L (ref 19–32)
Calcium: 8.5 mg/dL (ref 8.4–10.5)
Chloride: 118 mEq/L — ABNORMAL HIGH (ref 96–112)
GFR calc Af Amer: 30 mL/min — ABNORMAL LOW (ref >60)
Glucose, Bld: 151 mg/dL — ABNORMAL HIGH (ref 70–99)
Potassium: 4.5 mEq/L (ref 3.5–5.1)
Sodium: 159 mEq/L — ABNORMAL HIGH (ref 135–145)

## 2010-07-25 LAB — TROPONIN I: Troponin I: 0.14 ng/mL — ABNORMAL HIGH (ref 0.00–0.06)

## 2010-07-25 LAB — CBC
HCT: 34.2 % — ABNORMAL LOW (ref 36.0–46.0)
Hemoglobin: 10.6 g/dL — ABNORMAL LOW (ref 12.0–15.0)
RBC: 3.63 MIL/uL — ABNORMAL LOW (ref 3.87–5.11)
WBC: 12.4 10*3/uL — ABNORMAL HIGH (ref 4.0–10.5)

## 2010-07-25 LAB — MRSA PCR SCREENING: MRSA by PCR: NEGATIVE

## 2010-07-26 ENCOUNTER — Encounter (HOSPITAL_COMMUNITY): Payer: Self-pay | Admitting: Internal Medicine

## 2010-07-26 ENCOUNTER — Inpatient Hospital Stay (HOSPITAL_COMMUNITY): Payer: Medicare Other

## 2010-07-26 LAB — CBC
MCH: 29.9 pg (ref 26.0–34.0)
MCHC: 31.5 g/dL (ref 30.0–36.0)
Platelets: 294 10*3/uL (ref 150–400)
RBC: 3.98 MIL/uL (ref 3.87–5.11)

## 2010-07-26 LAB — RENAL FUNCTION PANEL
Albumin: 2 g/dL — ABNORMAL LOW (ref 3.5–5.2)
BUN: 46 mg/dL — ABNORMAL HIGH (ref 6–23)
GFR calc Af Amer: 36 mL/min — ABNORMAL LOW (ref >60)
GFR calc non Af Amer: 30 mL/min — ABNORMAL LOW (ref >60)
Phosphorus: 3.9 mg/dL (ref 2.3–4.6)
Potassium: 4.7 mEq/L (ref 3.5–5.1)

## 2010-07-27 ENCOUNTER — Inpatient Hospital Stay (HOSPITAL_COMMUNITY): Payer: Medicare Other

## 2010-07-27 LAB — CBC
MCH: 29.3 pg (ref 26.0–34.0)
MCHC: 31 g/dL (ref 30.0–36.0)
RBC: 4.13 MIL/uL (ref 3.87–5.11)
WBC: 13.8 10*3/uL — ABNORMAL HIGH (ref 4.0–10.5)

## 2010-07-27 LAB — RENAL FUNCTION PANEL
BUN: 43 mg/dL — ABNORMAL HIGH (ref 6–23)
CO2: 24 mEq/L (ref 19–32)
Calcium: 9 mg/dL (ref 8.4–10.5)
Glucose, Bld: 123 mg/dL — ABNORMAL HIGH (ref 70–99)
Sodium: 145 mEq/L (ref 135–145)

## 2010-07-27 LAB — BRAIN NATRIURETIC PEPTIDE: Pro B Natriuretic peptide (BNP): 539 pg/mL — ABNORMAL HIGH (ref 0.0–100.0)

## 2010-07-27 LAB — PREALBUMIN: Prealbumin: 23.5 mg/dL (ref 17.0–34.0)

## 2010-07-28 DIAGNOSIS — I5022 Chronic systolic (congestive) heart failure: Secondary | ICD-10-CM

## 2010-07-28 LAB — BASIC METABOLIC PANEL WITH GFR
BUN: 37 mg/dL — ABNORMAL HIGH (ref 6–23)
CO2: 20 meq/L (ref 19–32)
Calcium: 8.4 mg/dL (ref 8.4–10.5)
Chloride: 110 meq/L (ref 96–112)
Creatinine, Ser: 1.58 mg/dL — ABNORMAL HIGH (ref 0.4–1.2)
GFR calc non Af Amer: 32 mL/min — ABNORMAL LOW
Glucose, Bld: 111 mg/dL — ABNORMAL HIGH (ref 70–99)
Potassium: 5.1 meq/L (ref 3.5–5.1)
Sodium: 139 meq/L (ref 135–145)

## 2010-07-28 LAB — CBC
Hemoglobin: 11.3 g/dL — ABNORMAL LOW (ref 12.0–15.0)
MCV: 92.8 fL (ref 78.0–100.0)
Platelets: 448 10*3/uL — ABNORMAL HIGH (ref 150–400)
RBC: 3.87 MIL/uL (ref 3.87–5.11)
WBC: 13.4 10*3/uL — ABNORMAL HIGH (ref 4.0–10.5)

## 2010-07-29 LAB — CBC
HCT: 32.1 % — ABNORMAL LOW (ref 36.0–46.0)
Hemoglobin: 10 g/dL — ABNORMAL LOW (ref 12.0–15.0)
MCH: 29.3 pg (ref 26.0–34.0)
MCV: 94.1 fL (ref 78.0–100.0)
RBC: 3.41 MIL/uL — ABNORMAL LOW (ref 3.87–5.11)

## 2010-07-29 LAB — BASIC METABOLIC PANEL
CO2: 22 mEq/L (ref 19–32)
Chloride: 110 mEq/L (ref 96–112)
GFR calc Af Amer: 35 mL/min — ABNORMAL LOW (ref >60)
Sodium: 139 mEq/L (ref 135–145)

## 2010-07-30 LAB — BASIC METABOLIC PANEL
BUN: 33 mg/dL — ABNORMAL HIGH (ref 6–23)
Calcium: 8.6 mg/dL (ref 8.4–10.5)
GFR calc non Af Amer: 30 mL/min — ABNORMAL LOW (ref >60)
Glucose, Bld: 124 mg/dL — ABNORMAL HIGH (ref 70–99)
Potassium: 4.9 mEq/L (ref 3.5–5.1)

## 2010-07-30 LAB — CBC
HCT: 34.5 % — ABNORMAL LOW (ref 36.0–46.0)
MCHC: 31.3 g/dL (ref 30.0–36.0)
MCV: 94.3 fL (ref 78.0–100.0)
RDW: 15.9 % — ABNORMAL HIGH (ref 11.5–15.5)

## 2010-07-31 ENCOUNTER — Inpatient Hospital Stay (HOSPITAL_COMMUNITY): Payer: Medicare Other

## 2010-07-31 LAB — CBC
HCT: 30.8 % — ABNORMAL LOW (ref 36.0–46.0)
MCHC: 32.1 g/dL (ref 30.0–36.0)
RDW: 16 % — ABNORMAL HIGH (ref 11.5–15.5)

## 2010-07-31 LAB — BASIC METABOLIC PANEL
BUN: 28 mg/dL — ABNORMAL HIGH (ref 6–23)
Calcium: 8.5 mg/dL (ref 8.4–10.5)
GFR calc non Af Amer: 29 mL/min — ABNORMAL LOW (ref >60)
Glucose, Bld: 100 mg/dL — ABNORMAL HIGH (ref 70–99)
Sodium: 139 mEq/L (ref 135–145)

## 2010-07-31 NOTE — Consult Note (Signed)
NAMECRISTELLA, [name] NO.:  [id number]  MEDICAL RECORD NO.:  [id number]           PATIENT TYPE:  I  LOCATION:  3729                         FACILITY:  [facility]  PHYSICIAN:  Madolyn Frieze. Jens Som, MD, FACCDATE OF BIRTH:  January 12, 1941  DATE OF CONSULTATION:  07/25/2010 DATE OF DISCHARGE:                                CONSULTATION   HISTORY OF PRESENT ILLNESS:  The patient is a 70 year old female with past medical history of chronic renal insufficiency (status post nephrectomy in 1973), cystectomy for interstitial cystitis, recurrent UTIs, hypertension, chronic abdominal pain, gastroesophageal reflux disease who I am asked to evaluate for new-onset congestive heart failure and cardiomyopathy.  The patient's history is very complicated. She apparently was admitted in November 2011 with altered mental status. She was here for quite some time.  Her mental status was felt to be secondary to possible hospital psychosis with some suspicion for underlying dementia as well as related to medications.  The patient ultimately improved and was discharged to rehab.  She apparently did make some progress.  However, for the week prior to this admission, she began falling again.  She also developed mild confusion and ultimately fell and fractured her left hip.  She was therefore admitted on July 17, 2010.  She underwent repair of the hip fracture on July 18, 2010. Pre and postoperatively, she developed significant delirium.  A head CT was performed on July 18, 2010 that showed chronic microvascular ischemia in the white matter but no acute abnormality was noted.  Her confusion has improved although she remains mildly confused at baseline per daughter and somewhat lethargic.  Note, the patient had an echocardiogram performed in November 2011.  At that time, her LV function appeared to be normal.  I reviewed this myself.  There was moderate mitral regurgitation.  There was severe  tricuspid regurgitation.  The atria were enlarged.  She was seen by Dr. Elease Hashimoto for this issue as well as a mildly elevated troponin.  He recommended a Myoview which was performed on May 26, 2010.  Her ejection fraction was 62%.  There was no ischemia.  She now developed some degree of congestive heart failure symptoms and a repeat echocardiogram was performed on July 21, 2010.  The ejection fraction was now 25-30% with akinesis of the mid and distal anteroseptal myocardium.  There was moderate mitral regurgitation and severe tricuspid regurgitation. Cardiology is now asked to further evaluate.  Note, at present, the patient denies any dyspnea, chest pain, or syncope.  She was falling prior to admission but does not recall the details.  She does not think she lost consciousness but again her history is difficult due to her poor mental status.  Because of her congestive heart failure and cardiomyopathy, Cardiology is now asked to further evaluate.  CURRENT MEDICATIONS: 1. Ceftin 500 mg p.o. b.i.d. 2. Colace. 3. Doxepin 100 mg p.o. at bedtime. 4. Enoxaparin 30 mg p.o. daily. 5. Duragesic patch. 6. Lopressor 12.5 mg p.o. b.i.d. 7. Protonix 40 mg p.o. daily. 8. Risperdal 1 mg p.o. daily. 9. Senokot.  PAST MEDICAL HISTORY:  Significant for a renal insufficiency.  She has had a previous right nephrectomy and cystectomy with ileal conduit.  She has a history of multiple urological and bowel surgeries including lysis of adhesions and bowel resections for fistula repair at [facility]. She has chronic abdominal pain and recurrent UTIs.  She also has gastroesophageal disease.  She has a history of anemia, depression/anxiety, and neuropathy and gait instability.  She has had previous AV fistula placed.  SOCIAL HISTORY:  She is a former smoker.  She does not consume alcohol.  FAMILY HISTORY:  Her mother did have a myocardial infarction.  ALLERGIES:  She has no known drug  allergies.  REVIEW OF SYSTEMS:  Difficult as the patient is somewhat lethargic. However, there is no productive cough or hemoptysis.  There is no dysphagia or odynophagia.  There is no orthopnea or PND.  She denies any pedal edema.  She is a weak.  She is unstable with her gait prior to admission.  Remaining systems are negative.  PHYSICAL EXAMINATION:  VITAL SIGNS:  Blood pressure of 137/66 and pulse is 95.  Temperature is 97.9.  She is 97% on room air. GENERAL:  She is well-developed and very frail.  She appears to be chronically ill.  She is in no acute distress at present. SKIN:  Warm and dry.  She is somewhat lethargic.  There is no peripheral clubbing. BACK:  Not assessed. HEENT:  Normal with normal eyelids. NECK:  Supple with normal upstrokes bilaterally.  No bruits heard. There is no jugular distention and I cannot appreciate thyromegaly. CHEST:  Significant for rhonchi. CARDIOVASCULAR:  Regular rhythm.  There are no murmurs, rubs, or gallops noted. ABDOMEN:  Significant for multiple previous surgeries.  She has a cystostomy in place.  She has 2+ femoral pulses bilaterally.  There are no bruits noted. EXTREMITIES:  No edema that I could palpate.  No cords.  She has 2+ dorsalis pedis pulses bilaterally. NEUROLOGIC:  Grossly nonfocal.  She does have lethargy and decreased memory at this point.  She is also somewhat confused as she feels that she is in [place] and that it is December.  LABORATORY DATA:  Troponin-I of 0.14.  Her BNP is 626.  Her sodium is 159 with potassium of 4.5.  BUN and creatinine of 48 and 1.98 respectively.  Her white blood cell count is 12.4 with hemoglobin of 10.6 and hematocrit of 34.2.  Platelet count is 283.  Previous TSH on July 17, 2010 was 3.228.  Sed rate was 47.  Ferritin was 123.  Chest x-ray today shows improved aeration with no definite active process. Electrocardiogram from today shows a sinus rhythm.  Prior anterior infarcts cannot  be excluded.  There are anterior T-wave changes.  DIAGNOSES: 1. Cardiomyopathy - the etiology of this is unclear.  Note, it is new     since November.  She also appears to have a wall motion abnormality     and there is anterior T-wave inversion on her electrocardiogram.     Certainly, it could be ischemia mediated although her cardiac     marker is not extremely elevated and a previous Myoview was     negative.  She also has significant valvular disease on her     previous echocardiograms.  Her MR could certainly be underestimated     and be contributing to her cardiomyopathy, although I think this is     less likely.  Note that TSH and ferritin were normal.  Finally, a  septic cardiomyopathy could be considered.  Regardless, she has     multiple medical problems including renal insufficiency with     previous nephrectomy, confusion, and overall debilitated state.  I     have discussed this at length with she and her daughter.  I do not     think she is a good candidate for aggressive evaluation at this     point.  We will plan to treat medically with aspirin, beta-     blockade, hydralazine, and nitrates.  I would not recommend an ACE     inhibitor secondary to her renal insufficiency.  If she improves     clinically in the future, then we could consider a more aggressive     evaluation.  I would recommend repeat echocardiogram in 3 months on     medical therapy to see if her ejection fraction improves.  Note,     she is a no code blue and again the patient and daughter are in     agreement with this approach. 2. Renal insufficiency - management per Primary Care. 3. Hypernatremia - I agree with free water being added to her medical     regimen.  I do not think she needs to be diuresed at present as she     is euvolemic on examination.  She needs to be followed and Lasix     can be given as needed. 4. Delirium - this apparently has improved based on the notes but she     still has  some degree of confusion and the Primary Care Team is     evaluating this. 5. History of Escherichia coli bacteremia - she will continue on her     antibiotics per Primary Care. 6. Status post fall with hip fracture - this has been repaired and     they are awaiting placement.  Note, there is no evidence that she     had a syncopal episode, although it certainly would be in the     differential.  Again, however, we are not evaluating this     aggressively at this point unless she improves clinically in the     future.  We will be happy to follow while she is in the hospital.     Madolyn Frieze. Jens Som, MD, [facility]     BSC/MEDQ  D:  07/25/2010  T:  07/26/2010  Job:  [id number]  Electronically Signed by Olga Millers MD [facility] on 07/31/2010 08:11:14 AM

## 2010-08-01 LAB — CBC
Hemoglobin: 9.5 g/dL — ABNORMAL LOW (ref 12.0–15.0)
MCHC: 31.6 g/dL (ref 30.0–36.0)
Platelets: 483 10*3/uL — ABNORMAL HIGH (ref 150–400)
RDW: 15.9 % — ABNORMAL HIGH (ref 11.5–15.5)

## 2010-08-01 LAB — BASIC METABOLIC PANEL
Calcium: 8.5 mg/dL (ref 8.4–10.5)
GFR calc Af Amer: 43 mL/min — ABNORMAL LOW (ref >60)
GFR calc non Af Amer: 35 mL/min — ABNORMAL LOW (ref >60)
Sodium: 136 mEq/L (ref 135–145)

## 2010-08-01 LAB — URINALYSIS, ROUTINE W REFLEX MICROSCOPIC
Nitrite: NEGATIVE
Specific Gravity, Urine: 1.014 (ref 1.005–1.030)
Urobilinogen, UA: 0.2 mg/dL (ref 0.0–1.0)

## 2010-08-01 LAB — URINE MICROSCOPIC-ADD ON

## 2010-08-01 NOTE — Discharge Summary (Signed)
NAMEKYLE, [name]              ACCOUNT NO.:  [id number]  MEDICAL RECORD NO.:  [id number]           PATIENT TYPE:  I  LOCATION:  3729                         FACILITY:  [facility]  PHYSICIAN:  Hartley Barefoot, MD    DATE OF BIRTH:  03-03-1941  DATE OF ADMISSION:  07/17/2010 DATE OF DISCHARGE:  07/31/2010                        DISCHARGE SUMMARY - REFERRING   DISCHARGE DIAGNOSES: 1. Metabolic encephalopathy/delirium, multifactorial secondary to     infection and medication. 2. Mechanical fall versus syncope episode. 3. Left hip fracture status post hemiarthroplasty. 4. Urinary tract infection, Escherichia coli. 5. History of chronic kidney disease with chronic metabolic acidosis,     on bicarb, stage III. 6. Osteoporosis. 7. Hypertension. 8. Mitral regurgitation. 9. Chronic anemia. 10.Systemic inflammatory response syndrome secondary to Escherichia     coli bacteremia. 11.Escherichia coli bacteremia.  Blood cultures obtained from     [facility], pan-resistant, only sensitive to ceftriaxone, Zosyn,     meropenem, really only IV antibiotics options. 12.Hyperkalemia secondary to renal failure, resolved. 13.Hypernatremia secondary to dehydration, resolved. 14.Acute systolic heart failure with ejection fraction at 25-30% on 2-     D echo in 2012. 15.Other past medical history; neuropathy, gait instability. 16.Depression and anxiety. 17.Multiple urologic and bowel surgery including lysis of adhesions     for obstruction and bowel resection for fistular repair. 18.Chronic abdominal pain. 19.History of recurrent urinary tract infection. 20.History of prior right nephrectomy and cystectomy with ileal     conduit.  DISCHARGE MEDICATIONS: 1. Tylenol 650 mg p.o. every 6 hours as needed. 2. Alprazolam 0.5 mg by mouth three times a day as needed. 3. Ceftriaxone 1 g IV daily. 4. Carvedilol 6.25 by mouth twice daily with meals. 5. Lovenox 30 mg subcu daily. 6. Hydralazine 10 mg by mouth  four times daily. 7. Hydrocodone and Tylenol 5/325 one tablet by mouth every 6 hours as     needed.  If you use Vicodin, please avoid extra Tylenol. 8. Isosorbide mononitrate 30 mg by mouth daily. 9. Risperidone 1 mg by mouth daily at bedtime. 10.Senna docusate 1 tablet by mouth daily at bedtime as needed for     constipation. 11.Food Thickener 6 g by mouth as needed. 12.Tramadol 25 mg by mouth every 8 hours as needed. 13.Aspirin 81 mg p.o. daily. 14.Doxepin 100 mg by mouth daily at bedtime as needed. 15.Duragesic/fentanyl patch 1% transdermal every 3 days. 16.Ferrous sulfate 325 one tablet by mouth daily. 17.Fosamax 1 tablet by mouth weekly 70 mg. 18.Multivitamins 1 tablet by mouth daily. 19.Prilosec 20 mg p.o. daily. 20.Sodium bicarbonate 650 mg two tablet by mouth twice daily. 21.Vitamin B6 one tablet by mouth daily.  MEDICATIONS STOPPED OR CHANGED DURING THIS HOSPITALIZATION: 1. Atenolol. 2. Calcium carbonate. 3. Alprazolam. 4. Potassium chloride. 5. Diltiazem. 6. Hydrocodone.  DISPOSITION AND FOLLOWUP:  [name] will possibly be transferred to [facility], vs acute inpatient rehab. The plan was initially try to do skilled nursing facility, butthey would not take the patient with an IJ.  The patient is not able to get a PICC line because of her renal failure  and Nephrology wants toavoid a PICC line.  The patient  will need a CBC  to follow white blood cell and hemoglobin level.  She will need a BMET to follow creatinine level and acidosis.Consider decreasing sodium bicarbonate as needed.  STUDIES PERFORMED:  Please refer to progress note dictated on July 26, 2010, by Revonda Standard, nurse practitioner.  BRIEF HISTORY OF PRESENT ILLNESS:  This is a 70 year old that was transferred from [facility] for unwitnessed fall on July 16, 2010. Once there, she appeared confused and it was uncertain if she had a mechanical fall versus a fall related to syncope.  She was transferred to  [facility] for hip repair.  The patient was found to have white blood cell of 17.  On arrival to the [facility], her creatinine was at 2.1 and her white blood cell count was 17.  HOSPITAL COURSE: 1. Acute metabolic encephalopathy and acute delirium.  It is felt that     this was multifactorial, combination of the patient's prior psych     history, possible underlying dementia, potential acute     benzodiazepine withdrawal.  This was exacerbated by recent     anesthesia and post narcotic as well as recent infectious process,     bacteremia.  Due to her severity of delirium, she was evaluated by     Pulmonary Critical Care after surgery.  She received sedating     medications and anxiolytics.  Eventually, she was transitioned to     Risperdal and Sinequan and she is doing much better.  She is     oriented and awake and her acute metabolic encephalopathy and     delirium has improved. 2. Acute systolic congestive heart failure with a new ejection     fraction at 25-30%.  The patient had a Myoview in December 2011,     which demonstrated no evidence of ischemia and a normal ejection     fraction.  The patient during this hospitalization developed    asymmetric edema and was consistent for heart failure versus     pneumonia.  An echocardiogram showed ejection fraction at 25%.     Cardiology was consulted.  They recommend medical management at     this time.  She is on Imdur, hydralazine, and carvedilol. 3. Possible syncope versus unwitnessed fall.  This could be     multifactorial secondary to medication.  She had a compete CVA      workup that was negative. 4. Left hip fracture status post hemiarthroplasty.  Dr. Dion Saucier will     see the patient in 2 weeks.  Activity as per Dr. Dion Saucier.  PT/OT     consulted. 5. E. coli bacteremia with SIRS.  The patient was started on IV     vancomycin and Zosyn.  She had blood cultures from [facility] that     grew E. coli is pan-resistant.  It is sensitive to  ceftriaxone IV     and Zosyn.  Most of the options are IV.  The patient was started on     cefuroxime p.o., but on oral antibiotics, her white blood cell     continued to increase.  For this reason, she was transitioned to IV     medications.  She will require 7 more days of IV ceftriaxone.  The     patient might require an IJ placement.  She has difficult access.     Skilled nursing facility will not take her with an IJ.  We will try     [facility].She has some whites  debris from urostoy and urostomy site      appears pale, urology was consulted and they said that urostomy       appears normal. Renal US was negative for pyelo, obstruction or abscess.     A repeated UA is pending. 6. Chronic kidney disease stage III with chronic metabolic acidosis.     I will restart her today on her sodium bicarbonate.  We will need     to monitor renal function and bicarb.  During hospitalization, her     creatinine has remained stable from 2 on the day of admission had     decreased to 1.7.  We will continue to monitor. 7. Nutrition.  The patient is now tolerating diet. 8. Hypertension.  Blood pressure well controlled.  Continue with     current regimen of hydralazine, Imdur, Coreg. 9. Chronic pain with depression and anxiety.  The patient was on     fentanyl patch at home.  Continue with her fentanyl patch.  She was     getting IV fentanyl.  I stop IV fentanyl and I put her back on Vicodin     and tramadol as needed.  Also, we will need to be careful with pain     medications. 10.Hyperkalemia.  The patient had an elevated potassium at 5.1.  Her     potassium has normalized now at 4.7.  We will need to monitor     potassium level. 11.Diarrhea.  The patient has some episode of diarrhea.  C. diff was     negative.  Her bowel regimen was stopped.  We will need to give     docusate p.r.n. for bowel movement now that she is on narcotic.  Condiction date of discharge to be determined.     Hartley Barefoot, MD     BR/MEDQ  D:  07/31/2010  T:  07/31/2010  Job:  [id number]  Electronically Signed by Hartley Barefoot MD on 08/01/2010 10:03:05 PM

## 2010-08-02 LAB — BASIC METABOLIC PANEL
BUN: 23 mg/dL (ref 6–23)
CO2: 23 mEq/L (ref 19–32)
Calcium: 8.5 mg/dL (ref 8.4–10.5)
Creatinine, Ser: 1.55 mg/dL — ABNORMAL HIGH (ref 0.4–1.2)
GFR calc Af Amer: 40 mL/min — ABNORMAL LOW (ref >60)

## 2010-08-02 LAB — CBC
MCH: 29.4 pg (ref 26.0–34.0)
MCHC: 32 g/dL (ref 30.0–36.0)
MCV: 91.9 fL (ref 78.0–100.0)
Platelets: 459 10*3/uL — ABNORMAL HIGH (ref 150–400)

## 2010-08-06 LAB — DIFFERENTIAL
Eosinophils Absolute: 0.1 10*3/uL (ref 0.0–0.7)
Lymphs Abs: 1.5 10*3/uL (ref 0.7–4.0)
Monocytes Absolute: 0.8 10*3/uL (ref 0.1–1.0)
Monocytes Relative: 10 % (ref 3–12)
Neutrophils Relative %: 71 % (ref 43–77)

## 2010-08-06 LAB — BASIC METABOLIC PANEL
BUN: 22 mg/dL (ref 6–23)
CO2: 26 mEq/L (ref 19–32)
Chloride: 104 mEq/L (ref 96–112)
Creatinine, Ser: 1.42 mg/dL — ABNORMAL HIGH (ref 0.4–1.2)

## 2010-08-06 LAB — CBC
MCH: 29.6 pg (ref 26.0–34.0)
MCHC: 32.1 g/dL (ref 30.0–36.0)
MCV: 92 fL (ref 78.0–100.0)
Platelets: 365 10*3/uL (ref 150–400)
RBC: 3.01 MIL/uL — ABNORMAL LOW (ref 3.87–5.11)

## 2010-08-06 LAB — MAGNESIUM: Magnesium: 1.8 mg/dL (ref 1.5–2.5)

## 2010-08-18 NOTE — Discharge Summary (Signed)
[name], [name] NO.:  [id number]  MEDICAL RECORD NO.:  [id number]           PATIENT TYPE:  I  LOCATION:  5030                         FACILITY:  [facility]  PHYSICIAN:  Rock Nephew, MD       DATE OF BIRTH:  03/14/1941  DATE OF ADMISSION:  07/17/2010 DATE OF DISCHARGE:  08/08/2010                        DISCHARGE SUMMARY - REFERRING   DISCHARGE DIAGNOSES: 1. Metabolic encephalopathy. 2. Delirium, multifactorial, secondary to infection and medication. 3. Mechanical fall versus syncope. 4. Left hip fracture, status post hemiarthroplasty. 5. Urinary tract infection. 6. Escherichia coli. 7. History of chronic kidney disease. 8. Chronic metabolic acidosis, stage III-IV. 9. History of osteoporosis. 10.History of hypertension. 11.Dysphagia, on dysphagia III diet 12.Hypernatremia. 13.Diarrhea, resolved. 14.Chronic anemia from chronic kidney disease. 15.Drainage from ostomy. 16.Hyperkalemia secondary to renal failure, resolved. 17.Hypernatremia secondary to dehydration. 18.Acute systolic heart failure.  Again, ejection fraction 25 -30%.  OTHER PAST MEDICAL HISTORY: 1. Neuropathy, gait instability. 2. Depression and anxiety. 3. Multiple urological and bowel surgeries including lysis of     adhesions for obstruction, bowel resection, fistula repair. 4. Chronic abdominal pain. 5. History of recurrent urinary tract infection. 6. 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  DISCHARGE MEDICATIONS: 1. Acetaminophen 650 mg rectally every 6 hours as needed. 2. Alprazolam 0.5 mg 1/2 tablet by mouth 3 times a day as needed. 3. Carvedilol 9.375 mg by mouth twice daily. 4. Ensure 13 mL by mouth 3 times a day. 5. Hydralazine 10 mg by mouth 4 times a day. 6. Vicodin 1 tablet by mouth every 6 hours as needed. 7. Isosorbide mononitrate 30 mg 1 tablet by mouth daily. 8. Risperdal 1 mg by mouth at bedtime. 9. Senna and docusate 1 tablet by  mouth daily at bedtime. 10.Thick-It food thickener 6 grams by mouth as needed. 11.Tramadol 50 mg tablet, 25 mg by mouth every 8 hours as needed. 12.Aspirin 81 mg p.o. daily as needed. 13.Lovenox 30 mg subcu daily until August 22, 2010. 14.Doxepin 100 mg 1 tablet by mouth daily at bedtime. 15.Ferrous sulfate 325 mg p.o. daily. 16.Fosamax 70 mg 1 tablet by mouth weekly. 17.Multivitamins 1 tablet p.o. daily. 18.Prilosec 20 mg 1 tablet by mouth daily. 19.Sodium bicarbonate 650 mg 2 tablets by mouth twice daily. 20.Vitamin B6 one tablet by mouth daily.  DISPOSITION:  The patient is discharged to SNF.  DIET:  Heart-healthy, dysphagia III diet with 2 liters of fluid restrictions.  CONSULTANTS ON THIS CASE:  As discussed above.  PROCEDURES PERFORMED:  The patient had a left hip hemiarthroplasty done by Dr. Eulas Post on July 19, 2010.  The patient had a 2-D echo performed on July 21, 2009, which showed a left ventricular ejection fraction of 25-30%, akinesia of the mid distal anteroseptal myocardium, moderate mitral regurg, severe tricuspid regurg.  Pulmonary artery pressure was increased at 58 mmHg.  The patient had multiple radiological studies.  Last chest x-ray on July 27, 2010, showed no emphysematous changes.  Renal ultrasound on July 31, 2010, showed resolution of mild left hydronephrosis since prior ultrasound. No discrete abnormality involving the stoma.  The patient's one-view of left hip x-ray showed left hip arthroplasty, no complicating fracture.  FOLLOWUP: 1. The patient should follow up with Dr. Lionel December in 1 week. 2. The patient should follow up with Dr. Marina Gravel in 2-3 weeks. 3. The patient should follow up with Dr. Jethro Bolus as needed. 4. The patient should follow up with Dr. Kristeen Miss in 2-3 weeks. 5. The patient can also follow up with Dr. Eulas Post,     orthopedics as needed.  BRIEF HISTORY OF PRESENT ILLNESS:  A  70 year old was transferred from [facility] for admit after fall on July 16, 2010.  Once she appeared confused and was uncertain if she had mechanical fall versus fall- related syncope.  She was transferred from [facility] for hip repair.  The patient was found to have white blood cell count of 17.  On arrival to the [facility], a creatinine was 2.1 and a white blood cell count was 17. 1. Acute metabolic encephalopathy and acute delirium:  It was felt     that the patient had acute metabolic encephalopathy and delirium     which is multifactorial combination.  The patient has a prior psych     history of possible underlying dementia, potential acute     benzodiazepine withdrawal.  The patient was exacerbated by recent     anesthesia and post narcotic as well as infectious process     bacteremia.  The patient had negative blood cultures.  The patient     had urine culture and was sensitive to E. coli.  By the time I     started seeing the patient on August 02, 2010, the patient was     completely lucid and was in no state of delirium.  She was     initially transitioned to Risperdal and Seroquel and she is doing     much better. 2. Acute systolic heart failure:  The patient had a Myoview in     December 2011 which demonstrate no evidence of ischemia, normal     ejection fraction.  The patient during this hospitalization     developed asymmetric edema and consistent with heart failure and     pneumonia.  Echocardiogram showed EF of 25%.  Cardiology was     consulted.  They recommended medical management and at this time     she is on Imdur, hydralazine, and Coreg.  She is not a candidate     for an ACE inhibitor secondary to hyperkalemia and chronic kidney     disease.  The patient should follow up with Dr. Elease Hashimoto. 3. Left hip fracture:  Again, the patient had left hip fracture.  She     had had left hip hemiarthroplasty.  The patient is going to be on     Lovenox subcu for 28  days to prevent DVT and PE. 4. E. coli bacteremia with SIRS:  The patient was started on IV     vancomycin and Zosyn.  She had blood cultures from [facility] that     grew E. coli which was pan-resistant and was sensitive to     ceftriaxone IV and Zosyn.  The patient needed IV antibiotics.  The     patient could not have a PICC line placed because of chronic kidney     disease.  The patient had a central line placed and the patient     received IV ceftriaxone in the hospital.  The patient  initially     received vancomycin and Zosyn at the time I started seeing the     patient.  The patient needed antibiotics up to August 08, 2010.     The patient had renal ultrasound that is negative for bile     obstruction.  We also had a urology consult and they felt no     urological intervention was needed. 5. Chronic kidney disease, stage III:  The patient has chronic kidney     disease, stage III.  The patient's creatinine has been doing okay.     The patient's last creatinine was 1.42 on August 06, 2010.  The     patient should continue outpatient followup with Dr. Caryn Section. 6. Dysphagia:  The patient has been by Speech Therapy and the patient     is recommended on dysphagia III diet. 7. Hypernatremia:  The patient had some hyponatremia during the     admission and the hyponatremia has since resolved.  The     hyponatremia was thought to be secondary to dehydration. 8. Diarrhea:  The patient had some diarrhea and diarrhea has resolved. 9. Chronic anemia:  The patient has chronic anemia and was secondary     most likely to anemia of chronic kidney disease as well as anemia     of critical illness.  The patient's H&H most recently was 8.9 and     27.7.  The patient also is on iron. 10.Drainage from ostomy:  The patient had drainage from ostomy.  The     patient was seen by Dr. Jethro Bolus and he recommended no     intervention.  The patient should continue outpatient followup with     Dr.  Jethro Bolus as needed. 11.DVT prophylaxis:  Again, the patient will receive enoxaparin until     August 22, 2010.  The patient is a DNR and which we will honor.     Rock Nephew, MD     NH/MEDQ  D:  08/08/2010  T:  08/08/2010  Job:  [id number]  cc:   Lionel December, M.D. Richard F. Caryn Section, M.D. Vesta Mixer, M.D. Eulogio Ditch, MD Madolyn Frieze. Jens Som, MD, [facility] Sigmund I. Patsi Sears, M.D. Eulas Post, MD  Electronically Signed by Rock Nephew MD on 08/18/2010 06:21:19 PM

## 2010-08-21 NOTE — Consult Note (Signed)
  NAMEKESHIA, WEARE NO.:  [id number]  MEDICAL RECORD NO.:  [id number]           PATIENT TYPE:  LOCATION:                                 FACILITY:  PHYSICIAN:  Kirill Chatterjee I. Patsi Sears, M.D.DATE OF BIRTH:  1941/02/10  DATE OF CONSULTATION: DATE OF DISCHARGE:                                CONSULTATION   SUBJECTIVE:  This is a 70 year old female, DNR, with history of right nephrectomy 1973, and loop cystectomy 1974 in [place], [place].  She has had multiple stomal revisions per Dr. Rito Ehrlich in [place].  She has chronic mild left hydronephrosis, and a history of acute renal failure in June 2011, with creatinine 5.36, resolving at 2.78.  Her current creatinine is 1.72.  PAST SURGICAL HISTORY: 1. Cholecystectomy 2003. 2. Multiple segmental resections of small bowel. 3. Lysis of adhesions.  PAST MEDICAL HISTORY: 1. Hypertension. 2. Urinary tract infection. 3. Pneumothorax secondary to central line placement. 4. Cellulitis. 5. AV fistula, clotted in her left forearm.  HOME MEDICATIONS:  Atenolol, Fosamax, doxepin, calcium, vitamin D, Prilosec, aspirin, iron, sodium bicarbonate, and B6.  FAMILY HISTORY:  Noncontributory.  SOCIAL HISTORY:  No tobacco, no alcohol.  Constitutional review of systems is significant for weakness, nausea, anorexia.  PHYSICAL EXAMINATION:  GENERAL:  A thin, emaciated Caucasian female in no acute distress. ABDOMEN:  Soft.  Positive bowel sounds without organomegaly or masses. GENITOURINARY:  She has normal BUS.  Examination of the patient's stoma shows her __________ to be in good position.  The stoma itself is pink and normal.  There is some __________ dissolution and disintegration secondary to urinary reaction.  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  Loop looks fine.  She needs no urologic treatment at this time.     Hadlee Burback I. Patsi Sears, M.D.     SIT/MEDQ  D:   07/31/2010  T:  08/01/2010  Job:  [id number]  cc:   Hartley Barefoot, MD  Electronically Signed by Jethro Bolus M.D. on 08/21/2010 09:34:53 AM

## 2010-08-25 ENCOUNTER — Ambulatory Visit (INDEPENDENT_AMBULATORY_CARE_PROVIDER_SITE_OTHER): Payer: Medicare Other | Admitting: Nurse Practitioner

## 2010-08-25 DIAGNOSIS — R609 Edema, unspecified: Secondary | ICD-10-CM

## 2010-08-25 DIAGNOSIS — I5021 Acute systolic (congestive) heart failure: Secondary | ICD-10-CM

## 2010-08-25 DIAGNOSIS — R0602 Shortness of breath: Secondary | ICD-10-CM

## 2010-08-28 ENCOUNTER — Ambulatory Visit (INDEPENDENT_AMBULATORY_CARE_PROVIDER_SITE_OTHER): Payer: Medicare Other | Admitting: Internal Medicine

## 2010-08-28 DIAGNOSIS — G4700 Insomnia, unspecified: Secondary | ICD-10-CM

## 2010-08-28 DIAGNOSIS — R109 Unspecified abdominal pain: Secondary | ICD-10-CM

## 2010-08-30 ENCOUNTER — Ambulatory Visit (INDEPENDENT_AMBULATORY_CARE_PROVIDER_SITE_OTHER): Payer: Medicare Other | Admitting: Nurse Practitioner

## 2010-08-30 DIAGNOSIS — R609 Edema, unspecified: Secondary | ICD-10-CM

## 2010-08-30 DIAGNOSIS — I519 Heart disease, unspecified: Secondary | ICD-10-CM

## 2010-08-30 DIAGNOSIS — N189 Chronic kidney disease, unspecified: Secondary | ICD-10-CM

## 2010-09-05 LAB — URINE MICROSCOPIC-ADD ON

## 2010-09-05 LAB — HEPARIN LEVEL (UNFRACTIONATED)
Heparin Unfractionated: 0.22 IU/mL — ABNORMAL LOW (ref 0.30–0.70)
Heparin Unfractionated: 0.23 IU/mL — ABNORMAL LOW (ref 0.30–0.70)
Heparin Unfractionated: 0.23 IU/mL — ABNORMAL LOW (ref 0.30–0.70)
Heparin Unfractionated: 0.29 IU/mL — ABNORMAL LOW (ref 0.30–0.70)
Heparin Unfractionated: 0.33 IU/mL (ref 0.30–0.70)
Heparin Unfractionated: 0.43 IU/mL (ref 0.30–0.70)
Heparin Unfractionated: 0.44 IU/mL (ref 0.30–0.70)
Heparin Unfractionated: 0.8 IU/mL — ABNORMAL HIGH (ref 0.30–0.70)
Heparin Unfractionated: <0.1 IU/mL — ABNORMAL LOW (ref 0.30–0.70)

## 2010-09-05 LAB — GLUCOSE, CAPILLARY
Glucose-Capillary: 109 mg/dL — ABNORMAL HIGH (ref 70–99)
Glucose-Capillary: 111 mg/dL — ABNORMAL HIGH (ref 70–99)
Glucose-Capillary: 134 mg/dL — ABNORMAL HIGH (ref 70–99)
Glucose-Capillary: 136 mg/dL — ABNORMAL HIGH (ref 70–99)
Glucose-Capillary: 139 mg/dL — ABNORMAL HIGH (ref 70–99)
Glucose-Capillary: 140 mg/dL — ABNORMAL HIGH (ref 70–99)
Glucose-Capillary: 141 mg/dL — ABNORMAL HIGH (ref 70–99)
Glucose-Capillary: 142 mg/dL — ABNORMAL HIGH (ref 70–99)
Glucose-Capillary: 147 mg/dL — ABNORMAL HIGH (ref 70–99)
Glucose-Capillary: 154 mg/dL — ABNORMAL HIGH (ref 70–99)

## 2010-09-05 LAB — COMPREHENSIVE METABOLIC PANEL
ALT: 18 U/L (ref 0–35)
Alkaline Phosphatase: 57 U/L (ref 39–117)
BUN: 144 mg/dL — ABNORMAL HIGH (ref 6–23)
BUN: 21 mg/dL (ref 6–23)
BUN: 91 mg/dL — ABNORMAL HIGH (ref 6–23)
CO2: 26 mEq/L (ref 19–32)
CO2: 29 mEq/L (ref 19–32)
Calcium: 7.9 mg/dL — ABNORMAL LOW (ref 8.4–10.5)
Calcium: 8.3 mg/dL — ABNORMAL LOW (ref 8.4–10.5)
Chloride: 107 mEq/L (ref 96–112)
Chloride: 110 mEq/L (ref 96–112)
Creatinine, Ser: 1.96 mg/dL — ABNORMAL HIGH (ref 0.4–1.2)
Creatinine, Ser: 3.17 mg/dL — ABNORMAL HIGH (ref 0.4–1.2)
Creatinine, Ser: 5.02 mg/dL — ABNORMAL HIGH (ref 0.4–1.2)
GFR calc non Af Amer: 14 mL/min — ABNORMAL LOW (ref >60)
GFR calc non Af Amer: 25 mL/min — ABNORMAL LOW (ref >60)
GFR calc non Af Amer: 9 mL/min — ABNORMAL LOW (ref >60)
Glucose, Bld: 109 mg/dL — ABNORMAL HIGH (ref 70–99)
Glucose, Bld: 176 mg/dL — ABNORMAL HIGH (ref 70–99)
Potassium: 5.8 mEq/L — ABNORMAL HIGH (ref 3.5–5.1)
Sodium: 142 mEq/L (ref 135–145)
Total Bilirubin: 0.3 mg/dL (ref 0.3–1.2)
Total Bilirubin: 0.5 mg/dL (ref 0.3–1.2)

## 2010-09-05 LAB — CBC
HCT: 25 % — ABNORMAL LOW (ref 36.0–46.0)
HCT: 25.1 % — ABNORMAL LOW (ref 36.0–46.0)
HCT: 26.9 % — ABNORMAL LOW (ref 36.0–46.0)
HCT: 26.9 % — ABNORMAL LOW (ref 36.0–46.0)
HCT: 27.2 % — ABNORMAL LOW (ref 36.0–46.0)
HCT: 29.9 % — ABNORMAL LOW (ref 36.0–46.0)
Hemoglobin: 7.7 g/dL — ABNORMAL LOW (ref 12.0–15.0)
Hemoglobin: 8.3 g/dL — ABNORMAL LOW (ref 12.0–15.0)
Hemoglobin: 8.6 g/dL — ABNORMAL LOW (ref 12.0–15.0)
Hemoglobin: 8.6 g/dL — ABNORMAL LOW (ref 12.0–15.0)
Hemoglobin: 8.7 g/dL — ABNORMAL LOW (ref 12.0–15.0)
Hemoglobin: 8.8 g/dL — ABNORMAL LOW (ref 12.0–15.0)
Hemoglobin: 8.8 g/dL — ABNORMAL LOW (ref 12.0–15.0)
MCH: 33.3 pg (ref 26.0–34.0)
MCH: 33.5 pg (ref 26.0–34.0)
MCH: 34 pg (ref 26.0–34.0)
MCH: 34 pg (ref 26.0–34.0)
MCH: 34.1 pg — ABNORMAL HIGH (ref 26.0–34.0)
MCH: 34.3 pg — ABNORMAL HIGH (ref 26.0–34.0)
MCH: 34.4 pg — ABNORMAL HIGH (ref 26.0–34.0)
MCH: 35 pg — ABNORMAL HIGH (ref 26.0–34.0)
MCHC: 30.7 g/dL (ref 30.0–36.0)
MCHC: 32 g/dL (ref 30.0–36.0)
MCHC: 32 g/dL (ref 30.0–36.0)
MCHC: 32 g/dL (ref 30.0–36.0)
MCHC: 33.2 g/dL (ref 30.0–36.0)
MCHC: 34 g/dL (ref 30.0–36.0)
MCV: 100 fL (ref 78.0–100.0)
MCV: 103.3 fL — ABNORMAL HIGH (ref 78.0–100.0)
MCV: 106 fL — ABNORMAL HIGH (ref 78.0–100.0)
MCV: 111.1 fL — ABNORMAL HIGH (ref 78.0–100.0)
Platelets: 135 10*3/uL — ABNORMAL LOW (ref 150–400)
Platelets: 145 10*3/uL — ABNORMAL LOW (ref 150–400)
Platelets: 186 10*3/uL (ref 150–400)
Platelets: 209 10*3/uL (ref 150–400)
Platelets: 219 10*3/uL (ref 150–400)
RBC: 2.16 MIL/uL — ABNORMAL LOW (ref 3.87–5.11)
RBC: 2.42 MIL/uL — ABNORMAL LOW (ref 3.87–5.11)
RBC: 2.59 MIL/uL — ABNORMAL LOW (ref 3.87–5.11)
RBC: 2.59 MIL/uL — ABNORMAL LOW (ref 3.87–5.11)
RBC: 2.8 MIL/uL — ABNORMAL LOW (ref 3.87–5.11)
RBC: 2.82 MIL/uL — ABNORMAL LOW (ref 3.87–5.11)
RDW: 16.1 % — ABNORMAL HIGH (ref 11.5–15.5)
RDW: 17.1 % — ABNORMAL HIGH (ref 11.5–15.5)
RDW: 18.6 % — ABNORMAL HIGH (ref 11.5–15.5)
WBC: 10.2 10*3/uL (ref 4.0–10.5)
WBC: 13.9 10*3/uL — ABNORMAL HIGH (ref 4.0–10.5)
WBC: 15.8 10*3/uL — ABNORMAL HIGH (ref 4.0–10.5)
WBC: 9.4 10*3/uL (ref 4.0–10.5)

## 2010-09-05 LAB — DIFFERENTIAL
Basophils Absolute: 0 10*3/uL (ref 0.0–0.1)
Basophils Relative: 0 % (ref 0–1)
Neutro Abs: 7.4 10*3/uL (ref 1.7–7.7)
Neutrophils Relative %: 79 % — ABNORMAL HIGH (ref 43–77)

## 2010-09-05 LAB — BASIC METABOLIC PANEL
BUN: 72 mg/dL — ABNORMAL HIGH (ref 6–23)
CO2: 25 mEq/L (ref 19–32)
CO2: 25 mEq/L (ref 19–32)
CO2: 25 mEq/L (ref 19–32)
CO2: 26 mEq/L (ref 19–32)
CO2: 27 mEq/L (ref 19–32)
CO2: 29 mEq/L (ref 19–32)
Calcium: 8 mg/dL — ABNORMAL LOW (ref 8.4–10.5)
Calcium: 8.3 mg/dL — ABNORMAL LOW (ref 8.4–10.5)
Chloride: 108 mEq/L (ref 96–112)
Chloride: 110 mEq/L (ref 96–112)
Creatinine, Ser: 1.98 mg/dL — ABNORMAL HIGH (ref 0.4–1.2)
Creatinine, Ser: 2 mg/dL — ABNORMAL HIGH (ref 0.4–1.2)
GFR calc Af Amer: 30 mL/min — ABNORMAL LOW (ref >60)
GFR calc Af Amer: 30 mL/min — ABNORMAL LOW (ref >60)
GFR calc non Af Amer: 22 mL/min — ABNORMAL LOW (ref >60)
GFR calc non Af Amer: 25 mL/min — ABNORMAL LOW (ref >60)
GFR calc non Af Amer: 26 mL/min — ABNORMAL LOW (ref >60)
Glucose, Bld: 124 mg/dL — ABNORMAL HIGH (ref 70–99)
Glucose, Bld: 125 mg/dL — ABNORMAL HIGH (ref 70–99)
Glucose, Bld: 146 mg/dL — ABNORMAL HIGH (ref 70–99)
Glucose, Bld: 149 mg/dL — ABNORMAL HIGH (ref 70–99)
Potassium: 2.9 mEq/L — ABNORMAL LOW (ref 3.5–5.1)
Potassium: 3.2 mEq/L — ABNORMAL LOW (ref 3.5–5.1)
Potassium: 4.7 mEq/L (ref 3.5–5.1)
Potassium: 5.1 mEq/L (ref 3.5–5.1)
Sodium: 140 mEq/L (ref 135–145)
Sodium: 142 mEq/L (ref 135–145)
Sodium: 143 mEq/L (ref 135–145)
Sodium: 146 mEq/L — ABNORMAL HIGH (ref 135–145)

## 2010-09-05 LAB — HEPATIC FUNCTION PANEL
Albumin: 2.5 g/dL — ABNORMAL LOW (ref 3.5–5.2)
Alkaline Phosphatase: 47 U/L (ref 39–117)
Total Bilirubin: 0.3 mg/dL (ref 0.3–1.2)
Total Protein: 5.3 g/dL — ABNORMAL LOW (ref 6.0–8.3)

## 2010-09-05 LAB — CULTURE, BLOOD (ROUTINE X 2)
Culture  Setup Time: 201111241030
Culture  Setup Time: 201111241030
Culture: NO GROWTH

## 2010-09-05 LAB — URINALYSIS, ROUTINE W REFLEX MICROSCOPIC
Glucose, UA: NEGATIVE mg/dL
Hgb urine dipstick: NEGATIVE
Ketones, ur: NEGATIVE mg/dL
Leukocytes, UA: NEGATIVE
Nitrite: NEGATIVE
Protein, ur: NEGATIVE mg/dL
Specific Gravity, Urine: 1.006 (ref 1.005–1.030)
Urobilinogen, UA: 0.2 mg/dL (ref 0.0–1.0)
pH: 6.5 (ref 5.0–8.0)

## 2010-09-05 LAB — CARDIAC PANEL(CRET KIN+CKTOT+MB+TROPI)
CK, MB: 2.8 ng/mL (ref 0.3–4.0)
CK, MB: 4.1 ng/mL — ABNORMAL HIGH (ref 0.3–4.0)
CK, MB: 6.1 ng/mL (ref 0.3–4.0)
Relative Index: 2.5 (ref 0.0–2.5)
Relative Index: 2.6 — ABNORMAL HIGH (ref 0.0–2.5)
Relative Index: INVALID (ref 0.0–2.5)
Relative Index: INVALID (ref 0.0–2.5)
Total CK: 241 U/L — ABNORMAL HIGH (ref 7–177)
Total CK: 66 U/L (ref 7–177)
Total CK: 73 U/L (ref 7–177)
Troponin I: 0.01 ng/mL (ref 0.00–0.06)
Troponin I: 0.01 ng/mL (ref 0.00–0.06)
Troponin I: 0.32 ng/mL — ABNORMAL HIGH (ref 0.00–0.06)
Troponin I: 0.69 ng/mL (ref 0.00–0.06)

## 2010-09-05 LAB — MRSA PCR SCREENING
MRSA by PCR: NEGATIVE
MRSA by PCR: NEGATIVE

## 2010-09-05 LAB — POCT I-STAT 3, ART BLOOD GAS (G3+)
Acid-base deficit: 19 mmol/L — ABNORMAL HIGH (ref 0.0–2.0)
O2 Saturation: 99 %
pCO2 arterial: 26 mmHg — ABNORMAL LOW (ref 35.0–45.0)
pO2, Arterial: 146 mmHg — ABNORMAL HIGH (ref 80.0–100.0)

## 2010-09-05 LAB — MAGNESIUM
Magnesium: 1.6 mg/dL (ref 1.5–2.5)
Magnesium: 2.2 mg/dL (ref 1.5–2.5)

## 2010-09-05 LAB — URINE CULTURE: Special Requests: NEGATIVE

## 2010-09-05 LAB — BLOOD GAS, ARTERIAL
Bicarbonate: 24.6 mEq/L — ABNORMAL HIGH (ref 20.0–24.0)
FIO2: 0.21 %
FIO2: 1 %
Patient temperature: 98.6
TCO2: 24.9 mmol/L (ref 0–100)
TCO2: 25.5 mmol/L (ref 0–100)
pH, Arterial: 7.491 — ABNORMAL HIGH (ref 7.350–7.400)
pH, Arterial: 7.511 — ABNORMAL HIGH (ref 7.350–7.400)
pO2, Arterial: 59.8 mmHg — ABNORMAL LOW (ref 80.0–100.0)

## 2010-09-05 LAB — ABO/RH: ABO/RH(D): B POS

## 2010-09-05 LAB — IRON AND TIBC
Iron: 65 ug/dL (ref 42–135)
TIBC: 198 ug/dL — ABNORMAL LOW (ref 250–470)

## 2010-09-05 LAB — RENAL FUNCTION PANEL
Albumin: 2.6 g/dL — ABNORMAL LOW (ref 3.5–5.2)
BUN: 133 mg/dL — ABNORMAL HIGH (ref 6–23)
Calcium: 8.7 mg/dL (ref 8.4–10.5)
Creatinine, Ser: 4.37 mg/dL — ABNORMAL HIGH (ref 0.4–1.2)
GFR calc Af Amer: 12 mL/min — ABNORMAL LOW (ref >60)
GFR calc non Af Amer: 10 mL/min — ABNORMAL LOW (ref >60)
Phosphorus: 4 mg/dL (ref 2.3–4.6)

## 2010-09-05 LAB — TYPE AND SCREEN
Antibody Screen: NEGATIVE
Unit division: 0

## 2010-09-05 LAB — GLUCOSE, RANDOM: Glucose, Bld: 155 mg/dL — ABNORMAL HIGH (ref 70–99)

## 2010-09-05 LAB — PROTIME-INR
INR: 1.26 (ref 0.00–1.49)
Prothrombin Time: 16 seconds — ABNORMAL HIGH (ref 11.6–15.2)
Prothrombin Time: 16.8 seconds — ABNORMAL HIGH (ref 11.6–15.2)

## 2010-09-05 LAB — FOLATE: Folate: 15.1 ng/mL

## 2010-09-05 LAB — PHOSPHORUS
Phosphorus: 2.2 mg/dL — ABNORMAL LOW (ref 2.3–4.6)
Phosphorus: 3.9 mg/dL (ref 2.3–4.6)
Phosphorus: 4.6 mg/dL (ref 2.3–4.6)

## 2010-09-05 LAB — TSH: TSH: 1.911 u[IU]/mL (ref 0.350–4.500)

## 2010-09-10 LAB — BASIC METABOLIC PANEL
CO2: 27 mEq/L (ref 19–32)
CO2: 30 mEq/L (ref 19–32)
Calcium: 8.6 mg/dL (ref 8.4–10.5)
Calcium: 8.7 mg/dL (ref 8.4–10.5)
Calcium: 9.1 mg/dL (ref 8.4–10.5)
Chloride: 101 mEq/L (ref 96–112)
Chloride: 103 mEq/L (ref 96–112)
Chloride: 105 mEq/L (ref 96–112)
Chloride: 106 mEq/L (ref 96–112)
Creatinine, Ser: 1.58 mg/dL — ABNORMAL HIGH (ref 0.4–1.2)
Creatinine, Ser: 1.66 mg/dL — ABNORMAL HIGH (ref 0.4–1.2)
Creatinine, Ser: 1.7 mg/dL — ABNORMAL HIGH (ref 0.4–1.2)
GFR calc Af Amer: 36 mL/min — ABNORMAL LOW (ref >60)
GFR calc Af Amer: 37 mL/min — ABNORMAL LOW (ref >60)
GFR calc Af Amer: 38 mL/min — ABNORMAL LOW (ref >60)
GFR calc Af Amer: 39 mL/min — ABNORMAL LOW (ref >60)
GFR calc Af Amer: 40 mL/min — ABNORMAL LOW (ref >60)
GFR calc Af Amer: 41 mL/min — ABNORMAL LOW (ref >60)
GFR calc non Af Amer: 31 mL/min — ABNORMAL LOW (ref >60)
GFR calc non Af Amer: 32 mL/min — ABNORMAL LOW (ref >60)
GFR calc non Af Amer: 33 mL/min — ABNORMAL LOW (ref >60)
GFR calc non Af Amer: 34 mL/min — ABNORMAL LOW (ref >60)
Glucose, Bld: 70 mg/dL (ref 70–99)
Potassium: 4.4 mEq/L (ref 3.5–5.1)
Potassium: 4.8 mEq/L (ref 3.5–5.1)
Potassium: 5.2 mEq/L — ABNORMAL HIGH (ref 3.5–5.1)
Sodium: 135 mEq/L (ref 135–145)
Sodium: 136 mEq/L (ref 135–145)
Sodium: 136 mEq/L (ref 135–145)
Sodium: 138 mEq/L (ref 135–145)
Sodium: 139 mEq/L (ref 135–145)
Sodium: 139 mEq/L (ref 135–145)

## 2010-09-10 LAB — DIFFERENTIAL
Basophils Relative: 2 % — ABNORMAL HIGH (ref 0–1)
Basophils Relative: 0 % (ref 0–1)
Eosinophils Absolute: 0.2 10*3/uL (ref 0.0–0.7)
Eosinophils Absolute: 0.1 10*3/uL (ref 0.0–0.7)
Eosinophils Relative: 3 % (ref 0–5)
Eosinophils Relative: 2 % (ref 0–5)
Lymphocytes Relative: 22 % (ref 12–46)
Lymphs Abs: 2.1 10*3/uL (ref 0.7–4.0)
Lymphs Abs: 2.7 10*3/uL (ref 0.7–4.0)
Monocytes Absolute: 0.5 10*3/uL (ref 0.1–1.0)
Monocytes Relative: 5 % (ref 3–12)
Monocytes Relative: 6 % (ref 3–12)
Neutro Abs: 7 10*3/uL (ref 1.7–7.7)
Neutrophils Relative %: 50 % (ref 43–77)
Neutrophils Relative %: 72 % (ref 43–77)
Neutrophils Relative %: 57 % (ref 43–77)

## 2010-09-10 LAB — CBC
HCT: 32.4 % — ABNORMAL LOW (ref 36.0–46.0)
HCT: 44.6 % (ref 36.0–46.0)
HCT: 28.8 % — ABNORMAL LOW (ref 36.0–46.0)
HCT: 33.3 % — ABNORMAL LOW (ref 36.0–46.0)
Hemoglobin: 10.8 g/dL — ABNORMAL LOW (ref 12.0–15.0)
Hemoglobin: 10.2 g/dL — ABNORMAL LOW (ref 12.0–15.0)
Hemoglobin: 11 g/dL — ABNORMAL LOW (ref 12.0–15.0)
Hemoglobin: 11.1 g/dL — ABNORMAL LOW (ref 12.0–15.0)
Hemoglobin: 11.2 g/dL — ABNORMAL LOW (ref 12.0–15.0)
Hemoglobin: 11.4 g/dL — ABNORMAL LOW (ref 12.0–15.0)
Hemoglobin: 9.8 g/dL — ABNORMAL LOW (ref 12.0–15.0)
MCH: 35.9 pg — ABNORMAL HIGH (ref 26.0–34.0)
MCH: 36.5 pg — ABNORMAL HIGH (ref 26.0–34.0)
MCH: 36.8 pg — ABNORMAL HIGH (ref 26.0–34.0)
MCHC: 33.2 g/dL (ref 30.0–36.0)
MCHC: 33.8 g/dL (ref 30.0–36.0)
MCV: 106.2 fL — ABNORMAL HIGH (ref 78.0–100.0)
MCV: 106.3 fL — ABNORMAL HIGH (ref 78.0–100.0)
MCV: 108.5 fL — ABNORMAL HIGH (ref 78.0–100.0)
MCV: 108.9 fL — ABNORMAL HIGH (ref 78.0–100.0)
Platelets: 204 10*3/uL (ref 150–400)
Platelets: 102 10*3/uL — ABNORMAL LOW (ref 150–400)
Platelets: 127 10*3/uL — ABNORMAL LOW (ref 150–400)
Platelets: 149 10*3/uL — ABNORMAL LOW (ref 150–400)
Platelets: 177 10*3/uL (ref 150–400)
RBC: 2.95 MIL/uL — ABNORMAL LOW (ref 3.87–5.11)
RBC: 2.73 MIL/uL — ABNORMAL LOW (ref 3.87–5.11)
RBC: 2.82 MIL/uL — ABNORMAL LOW (ref 3.87–5.11)
RBC: 3.06 MIL/uL — ABNORMAL LOW (ref 3.87–5.11)
RBC: 3.07 MIL/uL — ABNORMAL LOW (ref 3.87–5.11)
RBC: 3.08 MIL/uL — ABNORMAL LOW (ref 3.87–5.11)
RBC: 3.1 MIL/uL — ABNORMAL LOW (ref 3.87–5.11)
RDW: 15.8 % — ABNORMAL HIGH (ref 11.5–15.5)
RDW: 15.6 % — ABNORMAL HIGH (ref 11.5–15.5)
RDW: 15.7 % — ABNORMAL HIGH (ref 11.5–15.5)
WBC: 6.3 10*3/uL (ref 4.0–10.5)
WBC: 9.7 10*3/uL (ref 4.0–10.5)
WBC: 11.9 10*3/uL — ABNORMAL HIGH (ref 4.0–10.5)
WBC: 13.8 10*3/uL — ABNORMAL HIGH (ref 4.0–10.5)
WBC: 6 10*3/uL (ref 4.0–10.5)
WBC: 9.1 10*3/uL (ref 4.0–10.5)

## 2010-09-10 LAB — RENAL FUNCTION PANEL
BUN: 62 mg/dL — ABNORMAL HIGH (ref 6–23)
CO2: 22 mEq/L (ref 19–32)
CO2: 29 mEq/L (ref 19–32)
Calcium: 8.3 mg/dL — ABNORMAL LOW (ref 8.4–10.5)
Calcium: 8.3 mg/dL — ABNORMAL LOW (ref 8.4–10.5)
Chloride: 102 mEq/L (ref 96–112)
Chloride: 109 mEq/L (ref 96–112)
Creatinine, Ser: 2.78 mg/dL — ABNORMAL HIGH (ref 0.4–1.2)
GFR calc Af Amer: 30 mL/min — ABNORMAL LOW (ref >60)
GFR calc non Af Amer: 25 mL/min — ABNORMAL LOW (ref >60)
Glucose, Bld: 96 mg/dL (ref 70–99)
Potassium: 4.4 mEq/L (ref 3.5–5.1)
Sodium: 134 mEq/L — ABNORMAL LOW (ref 135–145)

## 2010-09-10 LAB — CARDIAC PANEL(CRET KIN+CKTOT+MB+TROPI)
CK, MB: 1.1 ng/mL (ref 0.3–4.0)
CK, MB: 1.2 ng/mL (ref 0.3–4.0)
Troponin I: 0.02 ng/mL (ref 0.00–0.06)

## 2010-09-10 LAB — URINALYSIS, ROUTINE W REFLEX MICROSCOPIC
Glucose, UA: NEGATIVE mg/dL
Hgb urine dipstick: NEGATIVE
Leukocytes, UA: NEGATIVE
Protein, ur: NEGATIVE mg/dL
Specific Gravity, Urine: 1.01 (ref 1.005–1.030)
Specific Gravity, Urine: 1.006 (ref 1.005–1.030)
Urobilinogen, UA: 0.2 mg/dL (ref 0.0–1.0)
pH: 6.5 (ref 5.0–8.0)

## 2010-09-10 LAB — BLOOD GAS, ARTERIAL
Acid-base deficit: 14.7 mmol/L — ABNORMAL HIGH (ref 0.0–2.0)
Bicarbonate: 11.8 mEq/L — ABNORMAL LOW (ref 20.0–24.0)
TCO2: 11.4 mmol/L (ref 0–100)
pCO2 arterial: 31.6 mmHg — ABNORMAL LOW (ref 35.0–45.0)
pH, Arterial: 7.198 — CL (ref 7.350–7.400)
pO2, Arterial: 199 mmHg — ABNORMAL HIGH (ref 80.0–100.0)

## 2010-09-10 LAB — COMPREHENSIVE METABOLIC PANEL
ALT: 18 U/L (ref 0–35)
AST: 22 U/L (ref 0–37)
Albumin: 4.4 g/dL (ref 3.5–5.2)
Alkaline Phosphatase: 79 U/L (ref 39–117)
Alkaline Phosphatase: 95 U/L (ref 39–117)
BUN: 115 mg/dL — ABNORMAL HIGH (ref 6–23)
CO2: 17 mEq/L — ABNORMAL LOW (ref 19–32)
Calcium: 10 mg/dL (ref 8.4–10.5)
Chloride: 117 mEq/L — ABNORMAL HIGH (ref 96–112)
GFR calc Af Amer: 32 mL/min — ABNORMAL LOW (ref >60)
GFR calc non Af Amer: 26 mL/min — ABNORMAL LOW (ref >60)
Glucose, Bld: 80 mg/dL (ref 70–99)
Glucose, Bld: 81 mg/dL (ref 70–99)
Potassium: 5.3 mEq/L — ABNORMAL HIGH (ref 3.5–5.1)
Potassium: 5.1 mEq/L (ref 3.5–5.1)
Sodium: 139 mEq/L (ref 135–145)
Total Protein: 8.6 g/dL — ABNORMAL HIGH (ref 6.0–8.3)

## 2010-09-10 LAB — FOLATE: Folate: 11.8 ng/mL

## 2010-09-10 LAB — URINE CULTURE: Colony Count: >=100000

## 2010-09-10 LAB — IRON AND TIBC
Saturation Ratios: 40 % (ref 20–55)
UIBC: 130 ug/dL

## 2010-09-10 LAB — APTT: aPTT: 26 seconds (ref 24–37)

## 2010-09-10 LAB — URINE MICROSCOPIC-ADD ON

## 2010-09-10 LAB — OSMOLALITY, URINE: Osmolality, Ur: 344 mOsm/kg — ABNORMAL LOW (ref 390–1090)

## 2010-09-10 LAB — ACTH STIMULATION, 3 TIME POINTS
Cortisol, 30 Min: 21.8 ug/dL (ref >20)
Cortisol, 60 Min: 26.1 ug/dL (ref >20)
Cortisol, Base: 12 ug/dL

## 2010-09-10 LAB — RETICULOCYTES
RBC.: 3.22 MIL/uL — ABNORMAL LOW (ref 3.87–5.11)
Retic Count, Absolute: 19.3 10*3/uL (ref 19.0–186.0)

## 2010-09-10 LAB — PROTIME-INR
INR: 1.08 (ref 0.00–1.49)
Prothrombin Time: 13.9 seconds (ref 11.6–15.2)

## 2010-09-10 LAB — CHLORIDE, URINE, RANDOM: Chloride Urine: 53 mEq/L

## 2010-09-10 LAB — CK TOTAL AND CKMB (NOT AT ARMC)
CK, MB: 3 ng/mL (ref 0.3–4.0)
Relative Index: INVALID (ref 0.0–2.5)
Total CK: 39 U/L (ref 7–177)

## 2010-09-10 LAB — HEPARIN INDUCED THROMBOCYTOPENIA PNL: Heparin Induced Plt Ab: NEGATIVE

## 2010-09-10 LAB — BRAIN NATRIURETIC PEPTIDE: Pro B Natriuretic peptide (BNP): 534 pg/mL — ABNORMAL HIGH (ref 0.0–100.0)

## 2010-09-10 LAB — MRSA PCR SCREENING: MRSA by PCR: NEGATIVE

## 2010-09-10 LAB — TECHNOLOGIST SMEAR REVIEW

## 2010-09-12 NOTE — H&P (Signed)
[name], MCCLIMANS NO.:  [id number]  MEDICAL RECORD NO.:  [id number]          PATIENT TYPE:  INP  LOCATION:  3309                         FACILITY:  [facility]  PHYSICIAN:  Massie Maroon, MD        DATE OF BIRTH:  17-Apr-1941  DATE OF ADMISSION:  07/17/2010 DATE OF DISCHARGE:                             HISTORY & PHYSICAL   CHIEF COMPLAINT:  Altered mental status.  HISTORY OF PRESENT ILLNESS:  A 70 year old female with apparently fall in the sun room, unwitnessed.  This occurred July 16, 2010.  She was taken to [facility].  At the hospital she appeared confused and there is a question of syncope according to her family.  Apparently there was no Orthopedic physician to take care of the patient and so the patient was transferred to [facility] for further evaluation of a broken left hip.  The patient was admitted to medicine due to complaints of altered mental status.  Her urinalysis at [facility] did show positive for urinary tract infection.  White count was also somewhat elevated at 17.9 and she was noted to be somewhat anemic which it appears to be a chronic issue.  Chest x-ray at [facility] was read as borderline cardiomegaly but otherwise no acute process and EKG showed normal sinus rhythm at 75, normal axis, no ST, T segment changes consistent with ischemia.  MEDICAL HISTORY: 1. Chronic kidney disease stage III to IV. 2. Hyperuricemia. 3. 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 4. Recurrent UTI. 5. Chronic abdominal pain. 6. Multiple urological/bowel surgeries including lysis of adhesions     and bowel resection for fistula repair at [facility]. 7. History of GERD. 8. Chronic anemia. 9. Depression/anxiety. 10.Neuropathy/gait instability. 11.Moderate MR and severe TR.  PAST SURGICAL HISTORY:  See above and March 15, 2008 left brachiocephalic AV fistula, April 27, 2008  revision of left forearm AV fistula.  SOCIAL HISTORY:  The patient is married, disabled, former smoker who quit in 2000.  She does not drink.  FAMILY HISTORY:  Father died at age 6 with stomach cancer.  Mother died at age 59 of acute asthma and heart attack.  She has 3 children.  ALLERGIES:  No known drug allergies.  MEDICATIONS:  Please see medication reconciliation.  REVIEW OF SYSTEMS:  The patient fell Saturday night, she has been confused since at least yesterday.  Family does not think that is necessarily pain medication.  She is apparently on fentanyl patch as well as Vicodin in the past.  Review of systems is negative for all 10 organ systems except for pertinent positives stated above.  PHYSICAL EXAM:  VITAL SIGNS:  Temperature 98.5, pulse 66, blood pressure 131/54, pulse ox 97% on room air. NECK:  No JVD, no bruit. HEART:  Regular rate and rhythm.  S1-S2. LUNGS:  Clear to auscultation bilaterally. ABDOMEN:  Soft, nontender, nondistended.  Positive bowel sounds. Positive ileostomy. EXTREMITIES:  No cyanosis, clubbing or edema. SKIN:  No rashes. HEENT:  Mucous membranes dry. LYMPHATICS:  No adenopathy. NEUROLOGIC:  Nonfocal, cranial nerves II-XII intact.  Reflexes 2+, symmetric, diffuse with downgoing toes bilaterally, motor strength 5/5 in all four extremities. MUSCULOSKELETAL:  Pain with movement of the left leg consistent with her left hip fracture.  LABS:  Sodium 136, potassium 4.9, chloride 111, bicarb 19, BUN 38, creatinine 2.17, glucose 128, calcium 8.2.  WBC 17.9, hemoglobin 10.0, platelet count 234,000.  Urinalysis shows WBCs 75-100.  ASSESSMENT/PLAN: 1. Altered mental status:  Likely secondary to urinary tract     infection.  However, we will check a urine drug screen, Tylenol     level, B12, folic acid, ESR, ANA, RPR, TSH. 2. ? Syncope:  Obtain a CT brain noncontrast. 3. Left hip fracture:  Orthopedic consult, we obviously appreciate     their input.   The patient will made n.p.o. except for medications 4. Urinary tract infection:  Ceftriaxone 1 gram IV daily. 5. Chronic kidney disease stage III.  Continue sodium bicarb. 6. Osteoporosis:  Hold Fosamax, consider Prolia in light of renal     dysfunction, will defer to her outpatient primary care physician. 7. Hypertension:  Continue atenolol/Cardizem. 8. Mitral regurgitation:  Follow up with Dr. Elease Hashimoto as outpatient. 9. Anemia:  Repeat a CBC in the a.m. to ensure stability. 10.Mild acidosis:  Bicarb is slightly low.  She will continue sodium     bicarb p.o.  If unable to take p.o. medications then would add     bicarbonate to her fluid. 11.Deep vein thrombosis prophylaxis.  SCDs on a right leg.     Massie Maroon, MD     JYK/MEDQ  D:  07/17/2010  T:  07/17/2010  Job:  [id number]  cc:   Lionel December, M.D. Richard F. Caryn Section, M.D. Vesta Mixer, M.D. Eulogio Ditch, MD  Electronically Signed by Pearson Grippe MD on 09/12/2010 09:38:43 PM

## 2010-09-20 NOTE — Progress Notes (Signed)
NAMEDORINNE, GRAEFF NO.:  [id number]  MEDICAL RECORD NO.:  [id number]           PATIENT TYPE:  I  LOCATION:  3311                         FACILITY:  [facility]  PHYSICIAN:  Isidor Holts, M.D.  DATE OF BIRTH:  1940/12/05                                PROGRESS NOTE   CONSULTS THIS ADMISSION: 1. Eulas Post, MD with Orthopedic Surgery 2. Madolyn Frieze Jens Som, MD, [facility] with Cardiology 3. Nelda Bucks, MD with Pulmonary Critical Care Medicine.  CHIEF COMPLAINT/REASON FOR ADMISSION: Ms. Thakur is a 70 year old female patient who had an unwitnessed fall on January 22 and was subsequently taken to [facility].  Once there, she appeared confused and it was uncertain if she had mechanical fall versus a fall related to syncope.  Due to no orthopedic physician available to care for the patient, she was transferred to [facility]. Her urinalysis at [facility] did show evidence of urinary tract infection. She had a leukocytosis of 17,900 and appear to have a stable, but chronic anemia.  Her chest x-ray at [facility] was read as borderline cardiomegaly with no acute process.  EKG showed normal sinus rhythm.  No ischemic changes.  Upon Dr. Elmyra Ricks evaluation, the patient was afebrile with a temperature of 98.5, BP was 131/54, pulse 66, respirations 22, saturating 97% on room air.  Her physical exam was unremarkable except for a chronic ileal conduit in the abdominal wall and there was noted to be pain with movement of her left leg consistent with known hip fracture.  Her sodium was 136, potassium 4.9, bicarb 19, BUN 38, creatinine 2.17, glucose 128, calcium 8.2.  White count 17,900, hemoglobin 10, platelets 234,000. Urinalysis showed wbc's 75 to 100.  PAST MEDICAL HISTORY: 1. Chronic kidney disease stage III. 2. Hyperuricemia. 3. 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 4. Recurrent urinary tract infections. 5. Chronic abdominal  pain. 6. Multiple urologic and bowel surgeries including lysis of adhesions     for obstruction and bowel resection for fistula repair at [facility]. 7. GERD. 8. Chronic anemia.  Baseline hemoglobin 9-10. 9. Depression and anxiety. 10.Neuropathy, gait instability. 11.Moderate MR and severe TR.  ADMITTING DIAGNOSES: 1. Altered mental status, manifested as acute delirium most likely     secondary to acute infection. 2. Fall mechanical versus syncopal mediated. 3. Left hip fracture.  Orthopedics consultation pending. 4. Urinary tract infection. 5. Known chronic kidney disease with chronic metabolic acidosis on     bicarb stage III. 6. Osteoporosis. 7. Hypertension. 8. Mitral regurgitation. 9. Chronic anemia.  DIAGNOSTICS: 1. CT of the head on January 24 no contrast shows chronic     microvascular ischemia and white matter, no acute abnormality. 2. Portable chest x-ray on January 24 shows vague opacity in the     medial and the left lung base may represent atelectasis, scarring     or pneumonia.  Recommended two-view chest x-ray.  Stable     cardiomegaly. 3. Portable pelvic x-ray in the OR shows femoral stem placed on the     left. 4. Portable pelvic x-ray in the OR on  January 25 shows left hip     replacement without complicating features. 5. Additional cross-table hip portable one view shows left hip     arthroplasty.  No complicating feature, this was also January 25. 6. Portable chest x-ray on January 25 shows interval placement of left-     sided internal jugular line.  No pneumothorax.  Decreasing lung     volumes with interval appearance of asymmetric interstitial     prominence and increased opacity of the right lung base,     differential includes developing asymmetric edema, aspiration, or     infection. 7. Portable chest x-ray on January 27 shows improvement in     interstitial edema pattern.  Worsening airspace opacities within     the right upper lobe in the right  infrahilar region. 8. Portable renal ultrasound on January 27 that shows evidence of     prior right nephrectomy with mild hydronephrosis in the left kidney     similar in appearance to previous study, may be fluid around the     left kidney.  The history for this exam is to assess for     perinephric abscess.  The fluid around the left kidney could be     related to infection, but is not definitely characterizes such on     today's exam.  Also, incidental finding of bilateral pleural     effusions. 9. 2-D echocardiogram on January 27 shows systolic function severely     reduced with estimated ejection fraction of 25% to 30%.  Akinesis     of the mid distal anteroseptal myocardium, moderate mitral     regurgitation and severe tricuspid regurgitation with moderate     pulmonary hypertension measuring 58 mmHg. 10.Portable chest x-ray on January 28 that shows interval worsening of     pulmonary edema pattern, but stable cardiomegaly and effusions. 11.Portable chest x-ray on January 29 shows ongoing pulmonary edema     with small pleural effusions, slight improved aeration of the right     lung bases. 12.Portable chest x-ray on January 30th shows no substantial change in     exam. 13.Portable chest x-ray on January 31st shows improved aeration, no     definitive active process.  PROCEDURE/OPERATIVE REPORT: Left hip hemiarthroplasty on January 25 by Dr. Teryl Lucy.  LABORATORY DATA: At admission, PT 16.4, INR 1.3.  ESR 47.  TSH 3.228, vitamin B12 of 292, folic acid 10.4, ferritin 123, iron less than 10, UIBC 213.  RPR nonreactive.  ANA negative.  Urine drug screen after being treated at [facility] with pain medications was positive for benzodiazepines and opiates.  The patient is on both of these medications at home as well. MRSA PCR screen on January 25 was negative.  Procalcitonin on January 25 is less than 0.10.  Urine cultures, multiple colonies of from January 24.  Lactic acid on  January 25 was 2.7.  ABG on January 26, percentage of oxygen not noted, pH of 7.459, pCO2 of 20.5, pO2 of 57, bicarbonate 14.6, pCO2 of 15, base deficit 8.  Repeat ABG January 28, pH 7.37, pCO2 of 30, pO2 of 45, bicarbonate 17, pCO2 of 19, base deficit 7.  Blood cultures drawn at [facility] on January 24 show no growth.  Blood cultures obtained and [facility] resulted late in the hospitalization were positive for E. coli resistant to Cipro, Septra, ampicillin, intermediate to cefazolin, and resistant to Unasyn. Troponin-I on January 31 was 0.14.  BNP elevated as  626.  Recent labs as of February 1 , 2012; sodium 153, potassium 4.7, chloride 117, CO2 of 27, glucose 142, BUN 46, creatinine 1.71.  Albumin 2.0, phosphorus 3.9, white count 12,300, hemoglobin 11.9, hematocrit 37.8, platelets 294,000.  HOSPITAL COURSE: 1. Acute metabolic encephalopathy and acute delirium.  It is felt that     this issue is multifactorial in combination with the patient's     prior psych history, possible underlying dementia, potential acute     benzodiazepine withdrawal during the initial portion of the     hospitalization.  This was all exacerbated by recent anesthesia and     postop narcotics as well as recent infectious process and     bacteremia, ongoing hypoxemia related to asymmetric edema versus     aspiration pneumonia.  Due to the severity of her delirium and     several-day postop period, Pulmonary Critical Care Medicine was     consulted and they assumed care of the patient for several days.     All of her sedating pain and anxiolytic  medicines were     discontinued with a low-dose Presodex was used initially.  Eventually     she was transitioned over to Risperdal and Sinequan and is doing     much better, much less agitation and is more appropriate and is     able now to participate with her care. 2. Acute systolic congestive heart failure with a new EF of 25% to     30%.  Prior to  undergoing operative intervention, the patient was     cleared by triad hospitalist for surgical procedure.  She had     undergone a Myoview study on December 2011 which demonstrated no     evidence of ischemia and a normal ejection fraction.  After the     patient decompensated with her agitation, she also developed a     chest x-ray that was consistent with either asymmetric edema or     aspiration pneumonia or both.  This prompted Pulmonary Critical     Care Medicine to further investigate.  An echocardiogram was     ordered that demonstrated new depressed systolic function in     anterior septal regions.  EKGs performed on same date did show new     T-wave inversion in the anterior septal areas.  She was treated as     though she was volume overloaded and was diuresed with Lasix.  At the     present time, her heart failure is compensated.  She is on room     air.  Cardiology is following with her.  Etiology of the new     changes include possible occult MI verses demand ischemia secondary     to prolonged tachycardia.  Please note that while the patient was     severely agitated, she had persistent tachycardia with rates at     times up to 161 to 180.  Per cardiology note, she is currently not     a candidate for aggressive cardiac intervention treatment such as     catheterization so focus at this time is on medical treatment with     aspirin, beta blockers, hydralazine and nitrates.  She is not a     candidate for ACE inhibitors at this point due to her chronic     kidney disease. 3. Possibility of syncope versus unwitnessed fall.  The patient just     had been admitted several  weeks prior with similar type symptoms     and had completed CVA workup which was negative. 4. Left hip fracture status post hemiarthroplasty.  Dr. Dion Saucier has     been following.  He performed the initial surgical procedure.  She     is currently been cleared to begin mobilization with PT and has     been up  at least once with PT to mobilize. 5. E. Coli bacteremia with SIRS.  When the patient presented, she     appeared to have a urinary tract infection.  Repeat urinalysis and     culture here showed no evidence of a bacterial growth, but she had     received antibiotics at [facility].  When she decompensated     postoperatively and there were concerns for aspiration pneumonia,     she had empirically been placed on Zosyn and vancomycin which was     later narrowed to Zosyn.  Eventually we did receive documentation     from [facility] that she had a documented E. coli bacteremia.  She has     subsequently been started on cefuroxime for an additional 7 days to     treat the bacteremia. 6. Chronic kidney disease stage III with chronic metabolic acidosis.     Metabolic acidosis on chronic bicarbonate replacement/persistent     hypernatremia.  Immediate postop period, when the patient was     n.p.o., she developed a metabolic acidosis which improved with the     addition of bicarbonate to her IV fluids.  This was to replace the     oral dose she was normally receiving at home.  Her creatinine is at     baseline, but she has had persistent issues with hypernatremia.     While she had a Panda tube in place, she was receiving free water     via the tube as well as low flow IV fluids D5W 25 an hour.  We need     to watch this closely since we are discontinuing her Panda and tube     feedings today.  Se may not taken enough orally and we may need to     increase her free water via IV.  Monitoring of this needs to be     done closely since she also has associated systolic heart failure. 7. Dysphagia and possible protein calorie malnutrition.  The patient     was left n.p.o. in immediate postop due to altered mental status     and frank signs of possible aspiration with any attempt to     administer oral or medications.  The patient failed bedside     swallowing eval with speech therapy, but modified barium  swallowing     study today shows dysphagia that can respond to speech therapy     intervention as well as D3 diet so this was initiated today.  This     was also accompanied by accompanied by thickened liquids and     supervision.  A prealbumin has been ordered as well. 8. Moderate pulmonary hypertension in the setting of moderate     tricuspid and mitral regurgitation.  The patient was not on     afterload reduction prior to admission.  She is now on hydralazine     due to her associated systolic heart failure. 9. Hypertension.  Currently blood pressure is a moderately controlled     with treatment of systolic heart failure. 10.Chronic anemia.  Baseline hemoglobin is between 8.7 and 9.3.     Anemia panel done early in the hospitalization is consistent with     iron deficiency.  Once the patient is eating better consider oral     repletion of iron. 11.Chronic pain with depression, anxiety.  The patient was on fentanyl     patch 25 mcg at home as well as Vicodin because of recent issues     with altered mental status and majority of these medications had     been withdrawn.  She has recently been resumed on the fentanyl     patch and she is reporting inadequate pain control.  Currently we     are utilizing q.4 hour IV fentanyl for breakthrough pain and I have     just started low-dose Vicodin every 6 hours today.  She had     Dilaudid ordered, but I have discontinued this.  DISPOSITION: At the present time, the patient is stable enough to transfer out of the step-down unit.  She is profoundly deconditioned and we are newly instituting oral fee.  She needs been monitored closely with direct supervision by RN staff and therefore is appropriate for telemetry bed.     Allison L. Rennis Harding, N.P.   ______________________________ Isidor Holts, M.D.    ALE/MEDQ  D:  07/26/2010  T:  07/26/2010  Job:  [id number]  Electronically Signed by Junious Silk N.P. on 09/13/2010 03:53:35  PM Electronically Signed by Isidor Holts M.D. on 09/20/2010 02:18:49 PM

## 2010-09-28 ENCOUNTER — Encounter: Payer: Self-pay | Admitting: Cardiovascular Disease

## 2010-10-02 ENCOUNTER — Telehealth: Payer: Self-pay | Admitting: Cardiovascular Disease

## 2010-10-02 ENCOUNTER — Encounter: Payer: Self-pay | Admitting: Cardiovascular Disease

## 2010-10-02 ENCOUNTER — Ambulatory Visit (INDEPENDENT_AMBULATORY_CARE_PROVIDER_SITE_OTHER): Payer: Medicare Other | Admitting: Cardiovascular Disease

## 2010-10-02 DIAGNOSIS — R0602 Shortness of breath: Secondary | ICD-10-CM

## 2010-10-02 DIAGNOSIS — I509 Heart failure, unspecified: Secondary | ICD-10-CM | POA: Insufficient documentation

## 2010-10-02 DIAGNOSIS — Z79899 Other long term (current) drug therapy: Secondary | ICD-10-CM

## 2010-10-02 DIAGNOSIS — N189 Chronic kidney disease, unspecified: Secondary | ICD-10-CM

## 2010-10-02 LAB — CBC WITH DIFFERENTIAL/PLATELET
Basophils Absolute: 0 10*3/uL (ref 0.0–0.1)
Basophils Relative: 0.6 % (ref 0.0–3.0)
Eosinophils Absolute: 0 10*3/uL (ref 0.0–0.7)
HCT: 36.8 % (ref 36.0–46.0)
Hemoglobin: 12.6 g/dL (ref 12.0–15.0)
Lymphocytes Relative: 19.8 % (ref 12.0–46.0)
Lymphs Abs: 1.5 10*3/uL (ref 0.7–4.0)
MCHC: 34.1 g/dL (ref 30.0–36.0)
MCV: 92.5 fl (ref 78.0–100.0)
Monocytes Absolute: 0.5 10*3/uL (ref 0.1–1.0)
Neutro Abs: 5.4 10*3/uL (ref 1.4–7.7)
RBC: 3.98 Mil/uL (ref 3.87–5.11)
RDW: 18.9 % — ABNORMAL HIGH (ref 11.5–14.6)

## 2010-10-02 LAB — BASIC METABOLIC PANEL
CO2: 25 mEq/L (ref 19–32)
Calcium: 9.3 mg/dL (ref 8.4–10.5)
Chloride: 94 mEq/L — ABNORMAL LOW (ref 96–112)
Glucose, Bld: 101 mg/dL — ABNORMAL HIGH (ref 70–99)
Sodium: 132 mEq/L — ABNORMAL LOW (ref 135–145)

## 2010-10-02 MED ORDER — FUROSEMIDE 40 MG PO TABS: 40.0000 mg | ORAL_TABLET | Freq: Every day | ORAL | Status: DC

## 2010-10-02 MED ORDER — HYDRALAZINE HCL 10 MG PO TABS: 10.0000 mg | ORAL_TABLET | Freq: Three times a day (TID) | ORAL | Status: DC

## 2010-10-02 MED ORDER — CARVEDILOL 25 MG PO TABS
25.0000 mg | ORAL_TABLET | Freq: Two times a day (BID) | ORAL | Status: DC
Start: 2010-10-02 — End: 2011-10-22

## 2010-10-02 NOTE — Progress Notes (Signed)
History of Present Illness:  [name] is a middle-aged female with a history of congestive heart failure following hip fracture. She also has some degree of dementia. She has chronic renal insufficiency and is being followed by Dr. Marina Gravel. She's been a nursing home for the past several months. She ran out of several of her medications several days ago but does not recall exactly which ones she needs or which one she has been taking.  Current Outpatient Prescriptions on File Prior to Visit  Medication Sig Dispense Refill  . alendronate (FOSAMAX) 70 MG tablet Take 70 mg by mouth every 7 (seven) days. Take with a full glass of water on an empty stomach.       . ALPRAZolam (XANAX) 0.5 MG tablet Take 0.5 mg by mouth 3 (three) times daily as needed.        Marland Kitchen aspirin 81 MG tablet Take 81 mg by mouth daily.        . ferrous gluconate (FERGON) 325 MG tablet Take 325 mg by mouth daily with breakfast.        . Multiple Vitamin (MULTIVITAMIN) tablet Take 1 tablet by mouth daily.        . Nutritional Supplements (ENSURE ENLIVE) LIQD Take 13 mLs by mouth 5 (five) times daily.        . Pyridoxine HCl (VITAMIN B-6 PO) Take by mouth.        . sennosides-docusate sodium (SENOKOT-S) 8.6-50 MG tablet Take 1 tablet by mouth daily.        Marland Kitchen DISCONTD: furosemide (LASIX) 40 MG tablet Take 40 mg by mouth daily.        Marland Kitchen doxepin (SINEQUAN) 100 MG capsule Take 100 mg by mouth at bedtime.        Marland Kitchen Hydrocodone-Acetaminophen (VICODIN PO) Take 1 tablet by mouth every 6 (six) hours as needed.        . isosorbide dinitrate (ISORDIL) 30 MG tablet Take 30 mg by mouth daily.        Marland Kitchen omeprazole (PRILOSEC) 20 MG capsule Take 20 mg by mouth daily.        . risperiDONE (RISPERDAL) 1 MG tablet Take 1 mg by mouth daily.        . sodium bicarbonate 650 MG tablet Take 650 mg by mouth 2 (two) times daily.        Marland Kitchen DISCONTD: carvedilol (COREG) 25 MG tablet Take 25 mg by mouth 2 (two) times daily with a meal.        . DISCONTD:  hydrALAZINE (APRESOLINE) 10 MG tablet Take 10 mg by mouth 3 (three) times daily.          Allergies  Allergen Reactions  . Demerol   . Morphine And Related     Past Medical History  Diagnosis Date  . Hypertension   . Chronic kidney disease   . CHF (congestive heart failure)     Past Surgical History  Procedure Date  . Hip arthroplasty     left  . Kidney remove     1973  . Bladder removed     1974  . Small intestine surgery     1970 partial   . Cholecystectomy     2000    History  Smoking status  . Former Smoker  . Quit date: 06/25/2004  Smokeless tobacco  . Not on file    History  Alcohol Use No    Family History  Problem Relation Age of Onset  .  Heart attack Mother     Reviw of Systems:  The patient denies any heat or cold intolerance.  No weight gain or weight loss.  The patient denies headaches or blurry vision.  There is no cough or sputum production.  The patient denies dizziness.  There is no hematuria or hematochezia.  The patient denies any muscle aches or arthritis.  The patient denies any rash.  The patient denies frequent falling or instability.  There is no history of depression or anxiety.  All other systems were reviewed and are negative.  Physical Exam: BP 160/80  Pulse 76  Wt 124 lb (56.246 kg) The patient is alert and oriented x 3.  The mood and affect are normal.  The skin is warm and dry.  Color is normal.  The HEENT exam reveals that the sclera are nonicteric.  The mucous membranes are moist.  The carotids are 2+ without bruits.  There is no thyromegaly.  There is no JVD.  The lungs are clear.  The chest wall is non tender.  The heart exam reveals a regular rate with a normal S1 and S2.  There are no murmurs, gallops, or rubs.  The PMI is not displaced.   Abdominal exam reveals good bowel sounds.  There is no guarding or rebound.  There is no hepatosplenomegaly or tenderness.  There are no masses.  Exam of the legs reveal no clubbing,  cyanosis, or edema.  The legs are without rashes.  The distal pulses are intact.  Cranial nerves II - XII are intact.  Motor and sensory functions are intact.  The gait is normal.  Assessment / Plan:

## 2010-10-02 NOTE — Telephone Encounter (Signed)
Patient called with lab results. Jodette Sydny Schnitzler RN  

## 2010-10-02 NOTE — Assessment & Plan Note (Signed)
Mrs. [name] presents for followup of her congestive heart failure.  I think she overall seems to be going fairly well. She does not have any volume overload today. I've refilled her medications. She actually has no idea what medicines she has been taking.  It is somewhat difficult to evaluate her so she does not remember what she's taking.  I've refilled her congestive heart failure medications. We'll have her restart these. I'll see her back in the office in several months.

## 2010-10-23 ENCOUNTER — Ambulatory Visit (INDEPENDENT_AMBULATORY_CARE_PROVIDER_SITE_OTHER): Payer: Medicare Other | Admitting: Internal Medicine

## 2010-10-23 DIAGNOSIS — R109 Unspecified abdominal pain: Secondary | ICD-10-CM

## 2010-10-23 DIAGNOSIS — K219 Gastro-esophageal reflux disease without esophagitis: Secondary | ICD-10-CM

## 2010-10-23 DIAGNOSIS — M818 Other osteoporosis without current pathological fracture: Secondary | ICD-10-CM

## 2010-11-07 NOTE — Procedures (Signed)
CEPHALIC VEIN MAPPING   INDICATION:  Preop exam for AV fistula placement.   HISTORY:  Renal disease.   EXAM:   The right cephalic vein was not adequately visualized.   The left cephalic vein is compressible with diameter measurements  ranging from 0.23 to 0.35 cm.   See attached worksheet for all measurements.   IMPRESSION:  Patent left cephalic vein with diameter measurements as  described above and on the attached work sheet.   ___________________________________________  P. Liliane Bade, M.D.   CH/MEDQ  D:  03/11/2008  T:  03/11/2008  Job:  [id number]

## 2010-11-07 NOTE — Op Note (Signed)
[name], [name]              ACCOUNT NO.:  [id number]   MEDICAL RECORD NO.:  [id number]          PATIENT TYPE:  AMB   LOCATION:  SDS                          FACILITY:  [facility]   PHYSICIAN:  Balinda Quails, M.D.    DATE OF BIRTH:  [birth year]   DATE OF PROCEDURE:  03/15/2008  DATE OF DISCHARGE:  03/15/2008                               OPERATIVE REPORT   SURGEON:  Balinda Quails, MD   ASSISTANT:  RNFA.   ANESTHETIC:  Local MAC.   PREOPERATIVE DIAGNOSIS:  Stage IV chronic kidney disease.   POSTOPERATIVE DIAGNOSES:  Stage IV chronic kidney disease.   PROCEDURE:  Left brachiocephalic arteriovenous fistula.   OPERATIVE PROCEDURE:  The patient was brought to the operating room in  stable condition.  Placed in supine position.  Left arm was prepped and  draped in sterile fashion.   Skin and subcutaneous tissues were instilled with 1% Xylocaine.  A  longitudinal skin incision was made through the left anatomical snuff  box.  Dissection was carried down to expose a very small cephalic vein,  this was 1-2 mm in size.   Second skin incision was then made in the left antecubital fossa.  Transverse incision was extended through the subcutaneous tissue.  The  left antecubital vein was identified.  This was larger than the wrist  cephalic vein, 3-4 mm in size.  The proximal cephalic vein, however, was  quite small, did reveal some sclerosis from previous phlebotomy  procedures.   The brachial artery was exposed deep to this and encircled proximally  this with vessel loops.  The patient was administered 3000 units of  heparin intravenously.   A large perforating branch of the cephalic vein was ligated in the  antecubital fossa and the vein was divided leaving an end-to-side  opening to the cephalic vein.  The cephalic vein was therefore  anastomosed end-to-side to the brachial artery.  The brachial artery  controlled proximally and distally with serrefine clamps.  Longitudinal  arteriotomy was made.  The cephalic vein was anastomosed end-to-side to  the brachial artery using running 8-0 Prolene suture.  Clamps were then  removed.  Excellent flow was present down the forearm and slow flow  present proximally.  Adequate hemostasis was obtained.  Sponge and  instrument counts were correct.   Subcutaneous tissue was closed with running 3-0 Vicryl suture in single  layer.  Skin was closed with 4-0 Monocryl.  Dermabond was applied.  No  apparent complications.  The patient was transferred to the recovery  room in stable condition.      Balinda Quails, M.D.  Electronically Signed     PGH/MEDQ  D:  03/15/2008  T:  03/16/2008  Job:  [id number]

## 2010-11-07 NOTE — Op Note (Signed)
NAMEVALENTINE, KUECHLE              ACCOUNT NO.:  [id number]   MEDICAL RECORD NO.:  [id number]          PATIENT TYPE:  AMB   LOCATION:  SDS                          FACILITY:  [facility]   PHYSICIAN:  Balinda Quails, M.D.    DATE OF BIRTH:  02/29/1941   DATE OF PROCEDURE:  04/27/2008  DATE OF DISCHARGE:                               OPERATIVE REPORT   SURGEON:  Balinda Quails, MD   ASSISTANT:  Jerold Coombe, PA   ANESTHETIC:  Local with MAC.   PREOPERATIVE DIAGNOSIS:  Chronic kidney disease.   POSTOPERATIVE DIAGNOSIS:  Chronic kidney disease.   PROCEDURE:  Revision of left forearm arteriovenous fistula.   CLINICAL NOTE:  [name] is a 70 year old female with stage IV  chronic kidney disease.  Approaching hemodialysis.  She has previously  undergone a left antecubital vein and brachial artery AV fistula.  Followup duplex of this revealed enlargement of initially a very small  left forearm cephalic vein.  She was brought to the operating room at  this time for revision of the fistula.   OPERATIVE PROCEDURE:  The patient was brought to the operating room in  stable condition.  She was placed in supine position.  Left arm was  prepped and draped in the sterile fashion.   Skin and subcutaneous tissues were instilled with 1% Xylocaine with  epinephrine.  A longitudinal skin incision was made in the distal left  forearm over the cephalic vein.  Dissection was carried down to expose  the cephalic vein, which was 4 mm in size.  The vein tributaries were  ligated with 4-0 silk and divided.  The vein was mobilized and ligated  distally with 3-0 silk and divided.  Deep dissection was carried down to  expose the radial artery.  There was spasm.  This was very small.  Treated with papaverine.  The artery enlarged to 3 mm.  The patient was  administered 3000 units of heparin intravenously.  The left radial  artery was controlled with bulldog clamps.  The vein was divided and  controlled with bulldog clamp.  An end-to-side anastomosis between the  cephalic vein and radial artery was carried out with running 8-0 Prolene  suture.  Clamps were then removed.  Excellent flow was present through  the vein.   Attention was then placed in the left antecubital fossa.  Skin and  subcutaneous tissues were instilled with 1% Xylocaine.  Transverse skin  incision was made through the left antecubital fossa.  Dissection was  carried down and the left antecubital vein and brachial artery fistula  was ligated with 2-0 silk.   Adequate hemostasis was obtained.  Sponges and instrument counts were  correct.   Subcutaneous tissue was closed with running 3-0 Vicryl suture.  Skin was  closed with 4-0 Monocryl and Dermabond was applied.  No apparent  complications.  The patient was transferred to the recovery room in  stable condition.      Balinda Quails, M.D.  Electronically Signed     PGH/MEDQ  D:  04/27/2008  T:  04/27/2008  Job:  [id number] 

## 2010-11-07 NOTE — Procedures (Signed)
VASCULAR LAB EXAM   INDICATION:  Postop AV fistula with immature veins.   HISTORY:  End-stage renal disease.  Diabetes:  No.  Cardiac:  Hypertension:   EXAM:  Duplex of left arm AV fistula.   IMPRESSION:  Patent left arm arteriovenous fistula from A/C fossa and  below.      ___________________________________________  P. Liliane Bade, M.D.   MG/MEDQ  D:  04/15/2008  T:  04/16/2008  Job:  [id number]

## 2010-11-07 NOTE — Assessment & Plan Note (Signed)
OFFICE VISIT   [name], [name]  DOB:  05-02-1941                                       06/10/2008  EAVWU#:[id number]   The patient underwent revision of left forearm AV fistula on April 27, 2008.  This was a fairly complex revision to attempt a left  radiocephalic arteriovenous fistula.  The cephalic vein was quite small,  however had enlarged somewhat with prior fistula creation.   Unfortunately, the fistula is occluded at this time.  She does have very  small veins and arteries.  She potentially will pose a very challenging  problem for dialysis access.  I will not plan placement of new access at  this time until she is closer to dialysis.   Balinda Quails, M.D.  Electronically Signed   PGH/MEDQ  D:  06/10/2008  T:  06/11/2008  Job:  [id number]   cc:   Wilber Bihari. Caryn Section, M.D.

## 2010-11-07 NOTE — Consult Note (Signed)
VASCULAR SURGERY CONSULTATION   [name], [name]  DOB:  12/05/1940                                       03/11/2008  ZOXWR#:[id number]   REFERRING PHYSICIAN:  Aram Beecham B. Eliott Nine, M.D.   PRIMARY CARE PHYSICIAN:  Lionel December, M.D.   REASON FOR CONSULTATION:  Chronic renal insufficiency.   HISTORY:  The patient is a 70 year old female referred for hemodialysis  access placement.  She has chronic renal sufficiency, history of right  nephrectomy in 1973 along with cystectomy in 1974.  She has an ileal  loop in place.  History of chronic urinary tract infections.  Left  hydronephrosis.  Hypertension.  Chronic pain syndrome.   Venous mapping left arm reveals moderate to small size left cephalic  vein which is patent.   CURRENT MEDICATIONS:  1. Include Actonel 30 mg weekly.  2. Alprazolam 0.5 mg q.i.d.  3. Doxepin 150 mg q.h.s.  4. Hydrocodone 5/500 q.i.d. p.r.n.  5. Vitamin D 600 mg b.i.d.  6. Atenolol 25 mg daily.  7. Tums p.r.n.  8. Prilosec OTC 1 tablet daily.  9. Multivitamin 1 tablet daily.  10.Aspirin 81 mg daily.  11.Furosemide 40 mg daily.   ALLERGIES:  None known.   PHYSICAL EXAM:  General:  Reveals a thin 70 year old female.  No acute  distress.  Alert and oriented.  Vital signs:  Blood pressure is 121/61,  pulse 62 per minute.  Upper extremities:  Exam reveals 2+ left radial  pulse.  Small cephalic vein noted throughout the left upper arm which is  patent.   The patient was agreeable to placement of left arm arteriovenous  fistula.  This will be scheduled for 03/15/2008 at [facility].   Balinda Quails, M.D.  Electronically Signed  PGH/MEDQ  D:  03/11/2008  T:  03/12/2008  Job:  [id number]   cc:   Lionel December, M.D.  [facility] Eliott Nine, M.D.

## 2010-11-07 NOTE — Assessment & Plan Note (Signed)
OFFICE VISIT   [name], [name]  DOB:  16-Oct-1940                                       04/15/2008  EAVWU#:[id number]   The patient underwent placement of left brachial cephalic arteriovenous  fistula.  This was actually a planned staged procedure with end-to-side  anastomosis between the brachial artery and the antecubital vein.  A  followup duplex scan at this time reveals maturation of the cephalic  vein in the forearm with retrograde flow and enlargement of the vein.   Plan at this time will be to do a revision of her left arm AV fistula  with attempt at left radiocephalic anastomosis and ligation of the  brachial cephalic communication.   Balinda Quails, M.D.  Electronically Signed   PGH/MEDQ  D:  04/15/2008  T:  04/16/2008  Job:  [id number]
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
## 2010-11-10 NOTE — Discharge Summary (Signed)
[facility]  Patient:    [name], [name] Visit Number: [id number] MRN: [id number]          Service Type: MED Location: 3A A327 01 Attending Physician:  Malissa Hippo Dictated by:   Lionel December, M.D. Admit Date:  08/22/2001 Discharge Date: 08/27/2001                             Discharge Summary  DISCHARGE DIAGNOSES:  1. Acute gastroenteritis.  2. Severe dehydration secondary to above.  3. Azotemia secondary to volume depletion.  4. Electrolyte imbalance.  5. Reflux esophagitis.  6. Known cholelithiasis not felt to be the cause of symptomatology.  7. Chronic renal insufficiency.  8. Patient has ileal conduit.  9. Depression and anxiety. 10. Chronic insomnia. 11. 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 12. Mild anemia.  PRINCIPLE PROCEDURE:  1. Esophagogastroduodenoscopy by Dr. Karilyn Cota on August 04, 2001.  2. Abnormal chest x-ray.  CT reveals pulmonary scarring.  CONDITION ON DISCHARGE:  Much improved.  DISCHARGE MEDICATIONS:  1. Prilosec 40 mg every morning.  2. Bactrim DS 1 daily.  3. Doxepin 150 mg q.h.s.  4. Xanax 0.5 mg q.i.d.  5. MVI q.d.  6. Miralax 17 grams q.d.  7. Caltrate 2 tablets q.d.  8. Domperidone 10 mg before each meal.  9. Vicodin q.i.d. p.r.n.  HOSPITAL COURSE:  The patient is a 70 year old Caucasian female with multiple medical problems who presented to the emergency room with a 4 day history of nausea and vomiting.  The patient thought she had a virus and kept hoping that the symptoms would get better but she never did.  She reached the point that she felt weak and light-headed.  LABORATORY DATA:  Admission studies revealed WBC of 10.3, hemoglobulin and hematocrit 14.5, and 41.5.  Her hemoglobulin and hematocrit was much higher than her baseline values and felt to be due to hemo concentration.  Platelets count was 305K.  Sodium 120, potassium 3.7, BUN was 111, creatinine 4.2, calcium 9.0,  total protein 7.7 with albumin 4.0.  AST 24, ALT 29, AP 84, total bilirubin 0.5.  Amylase was 121, lipase was 43.  She was begun on IV fluids initially and made n.p.o..  She was also given Cipro IV as her urinalysis revealed 3-6 WBCs and a small amount of leukocyte esterases.  She has recurrent UTI and leukocyturia for which reason she has been Bactrim therapy chronically. She was also treated with protonix and maintained on her usual medications.  Chest x-ray revealed a nodularity versus guarding at left upper lobe.  This was further evaluated with CT and felt to be a scar.  She felt much better with hydration.  Her hemoglobulin and hematocrit dropped as expected to 10.8 and 30.8 but then was up to 11.1 and 31.8.  There was a gradual reduction in BUN and creatinine.  Prior to her discharge it was down to 22.0 and creatinine was 1.6.  When she initially presented she also gave a history of hematemesis. She therefore underwent esophagogastroduodenoscopy on August 04, 2001, and this was normal.  Her hyponatremia also gradually improved with therapy and by the time she was discharged it was 140.  As her diet was advanced she tolerated it well.  She was given fleet enemas and begun on her miralax to treat her chronic constipation.  Despite IV fluids initially her blood pressure was low. cortisol level therefore was checked and was normal.  Her blood pressure gradually returned what was felt to be normal for her.  The patient was much improved at the time of discharge.  PLAN:  To be seen in the office in a few weeks.  She is being evaluated by Dr. Acquanetta Belling of [facility] for possible cholecystectomy.  He had also wanted me to do a barium enema but this will be deferred for a few more weeks. Dictated by:   Lionel December, M.D. Attending Physician:  Malissa Hippo DD:  08/25/01 TD:  09/01/01 Job: [id number]

## 2010-11-10 NOTE — H&P (Signed)
[name], [name] NO.:  [id number]   MEDICAL RECORD NO.:  [id number]          PATIENT TYPE:  INP   LOCATION:  A323                          FACILITY:  [facility]   PHYSICIAN:  Lionel December, M.D.    DATE OF BIRTH:  Jun 16, 1941   DATE OF ADMISSION:  05/21/2004  DATE OF DISCHARGE:  LH                                HISTORY & PHYSICAL   PRESENTING COMPLAINT:  Weight loss, weakness, nausea, vomiting, abdominal  pain.   HISTORY OF PRESENT ILLNESS:  [name] is a 70 year old Caucasian female with  multiple medical problems who presented to the emergency room this afternoon  with a two-week history of not feeling well.  She has been losing weight.  She also has noted drop in her urine output.  Yesterday evening she started  to vomit.  She vomited food and fluid until this morning.  She also has had  more pain in the left side of her abdomen she has had before and also some  flank pain.  She has not had any fever.  She has felt very lightheaded.  On  a few occasions, she felt she was going to pass out.  She says her heartburn  has been well controlled.  She has been taking her medications as  prescribed.  She was seen at this facility on April 05, 2004, with  constipation, not having had a bowel movement for two weeks.  She was  treated in the emergency room and discharged. She states she has not felt  well since then, but she has been having bowel movements two or three times  a week.  She had what she would consider a normal bowel movement yesterday  and a small one this morning.  She denies melena or rectal bleeding.  She  feels she has urinary tract infection.  She has not noted any blood in her  conduit.  She weighed 124.9 pounds when she was in the ER about six weeks  ago.  Since then, she has lost about 12 pounds.  She denies dyspnea, cough,  or shortness of breath.  She is complaining of cramps in her calves.  She  denies joint pain or skin rash, but she gets bluish  discoloration to her  hands when she gets exposed to cold weather.  She has been diagnosed with  Raynaud's phenomenon in the past.   MEDICATIONS:  1.  Vicodin 5/500 four times a day.  2.  Xanax 0.5 mg four times a day.  3.  Nexium 40 mg q.a.m.  4.  Doxepin 150 mg q.h.s.  5.  Macrodantin 1500 mg daily.  6.  MiraLax 17 g daily p.r.n.  7.  Centrum daily.  8.  She occasionally takes Tums for heartburn.  She states the heartburn has      lately been well controlled.   PAST MEDICAL HISTORY:  1.  Chronic abdominal pain, mainly left lower quadrant where she has      extensive skin scarring and felt to have adhesions.  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  Most of these surgeries are      over 20 years ago.  The last one was in March 2003 by Dr. Veatrice Kells of [facility] when she had cholecystectomy      with lysis of adhesions and resection of a segment of her small bowel.  2.  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  3.  Raynaud's phenomena.  4.  Chronic GERD.  5.  Anxiety, insomnia, and history of depression.  6.  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  Previous attempts at      getting her off the narcotics have not been successful.  She was seen in      the pain clinic in [place], [place], a couple of years ago.  She      has also been tried on longer acting agents, but she has not done well.  7.  Renal failure.  Creatinine generally runs between 1.6 and 1.8.  She has      been advised to follow with Dr. Rito Ehrlich every time I see her as      previous ultrasound has shown mild left hydronephrosis, but      unfortunately she has not seen him recently.  She had a negative      leukogram in the past.  8.  She had incomplete colonoscopy and barium enema a few months ago at [facility] which reportedly was unremarkable.   ALLERGIES:  None known.   SOCIAL  HISTORY:  She is married.  She is disabled.  She has a son and two  daughters who are in good health. She smoked cigarettes for 12 years but  quit over 10 years ago.  This occurred when she was diagnosed with Raynaud's  phenomenon.  She does not drink alcohol.  She and her husband live in [place].  Her husband is having back problems, and she is concerned because he is to  have surgery in the near future.   FAMILY HISTORY:  One brother died of a lung carcinoma at age 35; another one  died of CAD.   PHYSICAL EXAMINATION:  GENERAL:  Pleasant, well-developed, thin Caucasian  female who is in no acute distress.  VITAL SIGNS:  Weight 112.8 pounds.  She is 65 inches tall.  Pulse 86 per  minute, blood pressure 152/79, respirations 20, temperature 99.2.  HEENT:  Conjunctivae pink, sclerae nonicteric.  Oropharyngeal mucosa is dry.  She has a few erosions of the inner aspect of left lower lip.  NECK:  JVD is flat.  Supple.  No thyromegaly or lymphadenopathy.  CARDIAC:  Regular rate and rhythm.  Normal S1 and S2.  No murmur or gallop  noted.  LUNGS:  Clear to auscultation.  ABDOMEN:  Flat. She has extensive scarring in the left lower quadrant of her  abdomen.  She has moderate amount of urine in the bag.  Does have a few  specks.  Bowel sounds are normal.  On palpation, she has tenderness in the  left lower quadrant which is a chronic finding.  RECTAL:  Examination is deferred.  EXTREMITIES:  She does not have peripheral edema or clubbing.   LABORATORY AND X-RAY DATA:  On admission, WBC 15.5, hemoglobin 13.4,  hematocrit 39.7, platelet count 386. She has 88 Segs.  Sodium 139, potassium  3.5, chloride 100, CO2  18, glucose 103, BUN 47, creatinine 2.7, calcium 9.1.  LFTs are pending.  Urinalysis reveals specific gravity of 1.020, pH of 7.5,  100 mg/dl protein, 21 to 50 wbc's, 21 to 50 rbc's, and many bacteria.   Chest x-ray was within normal limits.  IMPRESSION:  [name] is a 70 year old Caucasian  female with multiple chronic  problems who presents with weight loss, dehydration, recent episode of  vomiting, and abdominal pain.  She has mild leukocytosis, hyponatremia,  hypokalemia, and elevated BUN and creatinine.  Clearly she is azotemic  secondary to dehydration.  She possibly also has urinary tract infection.   I am not sure as to the cause of her weight loss.  This may be due to  diminished calorie intake.  Some of it is possibly due to dehydration.   PLAN:  1.  She has already had urine and blood cultures in the emergency room.  2.  IV fluids with KCl.  3.  LFTs will be checked.  4.  Metabolic 7 will be repeated in the a.m.  5.  Will stop her Macrodantin and start her on Cipro 4 mg IV q.12h.  6.  Will consider abdominal CT once the creatinine level is down; otherwise,      may just do abdominal ultrasound.  7.  Will continue her other usual medications.     Naje   NR/MEDQ  D:  05/21/2004  T:  05/21/2004  Job:  [id number]

## 2010-11-10 NOTE — Discharge Summary (Signed)
[facility]  Patient:    [name], [name] Visit Number: [id number] MRN: [id number]          Service Type: MED Location: 3A A310 01 Attending Physician:  Malissa Hippo Dictated by:   Tana Coast, P.A. Admit Date:  09/08/2001 Discharge Date: 09/11/2001                             Discharge Summary  DATE OF BIRTH:  10-07-1940.  ADMISSION DIAGNOSES: 1. Acute gastroenteritis. 2. Hypotension. 3. Recurrent nausea and vomiting. 4. Chronic abdominal pain. 5. Cholelithiasis felt to be the cause of her nausea, vomiting and chronic    right upper quadrant abdominal pain.  DISCHARGE DIAGNOSES: 1. 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 2. Acute gastroenteritis. 3. Recurrent nausea and vomiting. 4. Cholelithiasis felt to be the cause of #3. 5. Chronic abdominal pain. 6. Anxiety and depression. 7. Chronic constipation.  SERVICE:  Lionel December, M.D.  HISTORY OF PRESENT ILLNESS:  The patient is a pleasant 70 year old lady with a complicated medical history who was just discharged from [facility] on August 27, 2001, after a bout of bronchitis, urinary tract infection, volume contraction, secondary azotemia, who had been doing reasonably well as an outpatient until the day of admission.  She was at [facility] having an electrocardiogram to further evaluate a regular heartbeat in which she developed nausea and vomiting and copious watery diarrhea.  She noticed a slight amount of blood with wiping.  She had at least 10 stools and bilious vomiting.  She reports her ileal conduit had not drained in more than 4 hours. She developed hypotension with a blood pressure 60 systolic.  She was advised to come into the emergency department.  There she was found to have a systolic pressure of 88.  Labs drawn at [facility] earlier revealed a BUN of 66, creatinine 2.9, potassium 4.1, sodium 136, and glucose  163.  On admission examination revealed an empty ileostomy bag, abdomen nondistended, mildly to moderately diffusely tender without mass or organomegaly. Cardiac examination revealed regular rate and rhythm, no murmurs, rubs or gallops.  HOSPITAL COURSE:  Patient was admitted for symptomatic treatment of her nausea, vomiting as well as rehydration.  It was felt admission was important because the patient has marginal renal reserves and gets into intravascular volume disturbances quite easily when she loses fluids.  The patient was admitted.  She was given Phenergan 25 mg IM q.6h. p.r.n. nausea and Nubain 10 mg IV q.4h. p.r.n. pain.  She was also started on protonix 40 mg IV q.24h.  Strict I&Os were obtained throughout hospital stay.  The day after admission she was quite hypotensive with a blood pressure 70/32.  Her nausea, vomiting, and diarrhea had already resolved.  IV fluids were increased to 200 cc. per hour supplemented with 20 Kay Ciel per liter for mild hypokalemia.  Vicodin was given p.r.n. pain secondary to hypotension.  At the end of the second day of admission her blood pressure was better ranging in the 110s over 50s.  WBC was 9.2, hemoglobin 81.5, hematocrit 32.5.  Potassium 3.3, BUN 65, creatinine 2.2 and glucose 126.  Dr. Jena Gauss began initiation for transfer to [facility] regarding her renal dysfunction.  He spoke with both Dr. Myles Gip and Dr. Philis Fendt, and was told that there were no beds available and that patient may not qualify for transfer.  By day #3 the patient was feeling better.  She, again, no longer had an nausea and vomiting and diarrhea.  It was felt that her symptoms were related to a virus.  She continued to have left lower quadrant pain which is chronic for her.  Her weight was up 6 pounds with rehydration.  Therefore her IV fluids were decreased.  P.o. feedings were started.  Her usual medications were reinitiated.  The patients  daughter reported that she had gotten in touch with [facility] and was told that there would be a transfer arranged for September 11, 2001 (day #4 of hospitalization).  Notably, on day #4 of hospitalization her urinary output was 4300 cc. and total intake was 5066 cc.  Her blood pressure remained in the 100s over 50s as before.  On the day of transfer to [facility] it was noted that she had gained a total of 9 pounds with rehydration.  Her blood pressure was in the 120s over 50s.  The patient overall felt reasonably better, however, continued to have nausea and her chronic left lower quadrant abdominal pain.  She had had no bowel movement in over 2 days.  Her abdomen remained soft and tender in the left lower quadrant.  Her BUN was 19, creatinine 1.4, potassium 5.7, sodium 142.  Prior to transfer her supplemental potassium was discontinued and her IV fluids were decreased to 75 cc. per hour.  LABORATORY DATA:  September 09, 2001:  WBC 9.2, hemoglobin 11.5, hematocrit 32.5, platelets 319,000.  Sodium 136, potassium 3.3, BUN 65, creatinine 2.5, albumin 3.1, total bilirubin 6.0, albumin 3.1, SGOT 17, SGPT 18, alk. phos. 55, total bilirubin 0.6, magnesium 2.3.  On September 11, 2001:  Potassium 5.7, sodium 142, BUN 19, creatinine 1.4.  Notably on multiple occasions we tried to contact [facility] regarding transfer, however, was told that there was no bed available, however, upon persistent communication from patients daughter were eventually able to arrange for transfer.  CONDITION ON DISCHARGE:  Guarded.  DISPOSITION:  Patient was transferred via [facility] to [facility]. Dictated by:   Tana Coast, P.A. Attending Physician:  Malissa Hippo DD:  09/12/01 TD:  09/15/01 Job: [id number]

## 2010-11-10 NOTE — H&P (Signed)
NAMESANDREA, BOER              ACCOUNT NO.:  [id number]   MEDICAL RECORD NO.:  [id number]          PATIENT TYPE:  INP   LOCATION:  A323                          FACILITY:  [facility]   PHYSICIAN:  Tesfaye D. Felecia Shelling, M.D. DATE OF BIRTH:  1941/01/23   DATE OF ADMISSION:  04/04/2004  DATE OF DISCHARGE:  LH                                HISTORY & PHYSICAL   CHIEF COMPLAINT:  Constipation of two weeks' duration.   HISTORY OF PRESENT ILLNESS:  This is a 70 year old patient of Dr. Karilyn Cota  with multiple medical history came to the emergency room with above  complaint.  The patient has been in her usual state of health.  She has been  taking her regular medications including her pain medicine.  She noticed  constipation over the last two weeks.  The patient also started having  nausea but no vomiting.  She feels like bloated.  The patient tried several  laxatives and Fleet's enema.  Her constipation persisted.  She called her  primary care physician who advised her to the emergency room.  She was  evaluated in the emergency room and her KUB showed nonspecific gas and fecal  matter in her colon.  The patient was admitted and started on GoLYTELY.   REVIEW OF SYSTEMS:  The patient has no headache, fever, chest pain,  shortness of breath, cough, vomiting, or leg edema.   PAST MEDICAL HISTORY:  1.  Status post right nephrectomy with radical cystectomy.  2.  Status post abdominal surgeries for adhesion of small bowel and status      post cholecystectomy.  3.  Anemia.  4.  History of current UTI.  5.  History of depression disorder.  6.  Gastroesophageal reflux disease.   CURRENT MEDICATIONS:  1.  Lortab 5/500, one tablet p.o. q.i.d. p.r.n.  2.  Xanax 0.5 mg p.o. q.i.d.  3.  Doxepin 150 mg p.o. q.h.s.  4.  Macrodantin 150 mg p.o. every day.  5.  Nexium 40 mg p.o. every day.   SOCIAL HISTORY:  The patient is married.  The patient lives currently with  her husband.  The patient is disabled  due to her illness.  The patient  stopped smoking cigarettes about ten years back.  No history of alcohol or  substance abuse.   PHYSICAL EXAMINATION:  GENERAL:  The patient is alert, awake, and  chronically sick-looking.  VITAL SIGNS:  Blood pressure 114/63, pulse 90, respiratory rate 20,  temperature 98.9 degrees Fahrenheit.  HEENT:  Pupils are equal reactive.  NECK:  Supple.  CHEST:  Clear lung fields.  Good air entry.  CARDIOVASCULAR:  First and second heart sounds heard.  No murmur.  No  gallop.  ABDOMEN:  Full.  Soft and lax and there is multiple midline surgical scars.  The patient has a urostomy bag in her right lower quadrant.  EXTREMITIES:  No leg edema.   LABS:  On admission, CBC with a WBC of 9.9, hemoglobin 12.0, hematocrit  35.5, platelets 210.  Sodium 129, potassium 3.9, chloride 98, carbon dioxide  22, glucose 76, BUN 17, creatinine  1.3.  Bilirubin 0.4, alkaline phosphatase  92, SGOT __________  SGPT 19, total protein 5.9, albumin 3.6, and calcium  9.7.   ASSESSMENT:  1.  Chronic constipation, probably secondary to side effect of hydrocodone.  2.  Mild hyponatremia.  3.  Status post right nephrectomy with a radical cystectomy.  4.  Gastroesophageal reflux disease.  5.  Status post multiple abdominal surgeries for small bowel adhesion.   PLAN:  1.  Will start the patient on GoLYTELY.  2.  Will continue the patient on normal saline at a rate of 50 cc/hr.  3.  Will monitor her electrolytes.  4.  Will continue the patient on her regular medications.      TDF/MEDQ  D:  04/04/2004  T:  04/04/2004  Job:  [id number]

## 2010-11-10 NOTE — Discharge Summary (Signed)
NAME:  NOLA, BOTKINS                        ACCOUNT NO.:  [id number]   MEDICAL RECORD NO.:  [id number]                   PATIENT TYPE:  INP   LOCATION:  A222                                 FACILITY:  [facility]   PHYSICIAN:  Lionel December, M.D.                 DATE OF BIRTH:  Jan 29, 1941   DATE OF ADMISSION:  12/30/2002  DATE OF DISCHARGE:  01/02/2003                                 DISCHARGE SUMMARY   DISCHARGE DIAGNOSES:  1. Orthostatic syncope.  2. Dehydration.  3. Renal failure.  4. Unexplained weight loss.  5. Chronic cough, possibly secondary to gastroesophageal reflux disease.  6. Chronic gastroesophageal reflux disease.  7. 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  8. Anxiety.  9. Depression.  10.      Insomnia.  11.      Chronic constipation.  12.      Chronic leukocyturia.  13.      Hypokalemia.  14.      Urinary tract infection.   CONDITION AT THE TIME OF DISCHARGE:  Much improved.   DISCHARGE MEDICATIONS:  1. OxyContin SR 10 mg every 12 hours.  2. Doxepin 100 mg p.o. q.h.s.  3. Xanax 0.5 mg q.i.d.  4. Bactrim DS 1 daily.  5. Nexium 40 mg b.i.d.  6. MiraLax 17 g daily.   HOSPITAL COURSE:  Keelin is a 70 year old Caucasian female with multiple  medical problems, who was brought to the emergency room by her husband  because of a syncopal episode.  She had not been feeling well for the last  few days.  She had been getting postural lightheadedness, but prior to  admission she had frank syncope.  In the emergency room, she was noted to be  hypotensive and orthostatic.  Her CPK and troponin levels were normal.  She  did not have any evidence of acute injury on EKG.  The patient stated that  she had a very good appetite and had not experienced any nausea or vomiting.  She has chronic polyuria which, according to her, had not changed.  However,  she wondered that she may not have __________ p.o. fluid intake.  She was  admitted to a monitored bed and begun  on IV fluids.  She had lost about 20  pounds in the last 10-12 weeks.  Initial laboratory revealed WBC of 8.7, H&H  of 10.9 and 32.7, platelet count was 232,000.  Sodium was 132, potassium  3.3, chloride 112, CO2 was 14, glucose 108, BUN 49, creatinine 2.2, calcium  8.5.  Albumin was 3.3.  Her bilirubin was 0.6, AP 103, AST 18, ALT 21.  Amylase was 74, lipase 30.  Her magnesium was also checked and was 2.2.  Her  urinalysis revealed positive nitrates, 11-20 WBCs.  The patient has ileal  conduit secondary to previous radical cystectomy when she also had right  nephrectomy.  Urine culture was obtained, and she  was also begun on Cipro.  She was also given KCl to correct her hypokalemia.  A renal ultrasound was  obtained.  It showed very mild, stable hydronephrosis when compared to  previous study of March 2004.  Ms. Basista had been asked to follow with  Dr. Rito Ehrlich, her urologist, but this has not materialized; however, she  does have an appointment to see him within the next two weeks.  The  patient's weight loss was further evaluated with fasting cortisol level  which was within normal limits.  She has had TSH before and was therefore  not checked.  She also had chest CT to evaluate her weight loss and cough.  This study was normal other than some apical scarring.  I felt her cough was  most likely on the basis of GERD, and she had not had satisfactory response  to therapy.  The patient did not have any nausea and vomiting while in the  hospital.  Her urine output was __________, and she had anywhere from 23-  3300 mL of urine per 24 hours.  She has had polyuria for awhile which was  felt to be due to her chronic renal disease and loss of concentrating  mechanism.  Once she was well hydrated, her orthostasis resolved.  She  ambulated on the floor without any lightheadedness or dizziness.  Her weight  increased to 128 pounds, which is still less than her baseline weight of  over 140  pounds.  Post hydration, her H&H dropped to 9.1 and 27.3 but then  leveled off to around 10.1 and 30.5.  Her anemia profile obtained was  pending at the time of discharge.  Post hydration, her BUN dropped to 29 and  creatinine to 1.6.  While in the hospital she was also complaining of left  hip pain.  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  The  patient was feeling a lot better with these measures.  She was discharged to  home.  She will follow with me in four weeks, but she has an appointment to  see Dr. Rito Ehrlich in the near future.  She is also to follow with Dr. Egbert Garibaldi,  whom she has an appointment with to further evaluate her chronic cough.  As  noted above, I feel this is due to GERD, but I would like to make sure she  does not have any pulmonary process.   The patient was asked to keep up with p.o. fluids.  I asked her to at least  drink the amount of fluid her urine output is plus another 5-700 mL per day  accounting for skin losses.  I also asked her not to work outside in the  dry, hot weather, which she does a lot.  Her urine culture had more than  100,000 colonies, was a __________and felt to be contamination.   As far as her issue of chronic pain is concerned, I felt we should switch  her to another medication so that she would not be getting all this  acetaminophen which at times can cause renal damage.  She was switched over  to OxyContin, which she tolerated well.  I also dropped her doxepin as I  felt that this could be making her orthostatic symptoms worse.   She will have a CBC and a MET-7 at the time of her next office visit.  Lionel December, M.D.    NR/MEDQ  D:  01/03/2003  T:  01/03/2003  Job:  [id number]   cc:   Dennie Maizes, M.D.  [address]  [place]  [place] [address]  Fax: [phone]   Kern Reap, M.D.  [place], [place]

## 2010-11-10 NOTE — Discharge Summary (Signed)
[name], [name]              ACCOUNT NO.:  [id number]   MEDICAL RECORD NO.:  [id number]          PATIENT TYPE:  INP   LOCATION:  A302                          FACILITY:  [facility]   PHYSICIAN:  Lionel December, M.D.    DATE OF BIRTH:  04/14/1941   DATE OF ADMISSION:  10/15/2004  DATE OF DISCHARGE:  04/25/2006LH                                 DISCHARGE SUMMARY   DISCHARGE DIAGNOSES:  1.  Dehydration with azotemia.  2.  Nausea and vomiting.  3.  Secondary colonic obstruction/fecal impaction.  4.  Chronic abdominal pain.  5.  Chronic gastroesophageal reflux disease  6.  Anxiety neurosis.  7.  Urinary tract infection secondary to Klebsiella pneumonia and e-coli.  8.  Chronic insomnia.  9.  Osteoporosis.  10. Hyponatremia secondary to vomiting.   CONDITION ON DISCHARGE:  Improved.   DISCHARGE MEDICATIONS:  1.  Ciprofloxacin 500 mg p.o. b.i.d. for 5 days, after which she will go      back to her usual dose of Macrodantin 50 mg q.d.  2.  Vicodin 1 q.i.d.  3.  Xanax 50 mg q.d.  4.  Vicodin 5/500 1 q.i.d.  5.  Xanax 0.5 mg q.i.d.  6.  Prilosec 20 mg p.o. q. a.m.  7.  Doxepin 150 mg q.h.s.  8.  MiraLax 17 grams once or twice daily.  9.  Actonel 35 mg q. weekly.  10. Calcium with vitamin D 1.2 grams q.d.   HOSPITAL COURSE:  [name] is a 70 year old Caucasian female with multiple  medical problems who was in usual state of health until a few days ago, when  she developed abdominal fullness, constipation, associated with nausea and  vomiting. Two days prior to admission, she started to feel light headed and  dizzy. Although she did not vomit on the day before admission but she still  not feeling any better. She came to emergency room. She was evaluated by Dr.  Margretta Ditty. She was felt to be dehydrated. She also had hyponatremia. BUN was  40. Creatinine was 1.9. Her acute abdominal series and chest x-ray films are  unremarkable. Her LFTs are normal. Chest x-ray was unremarkable. She had  a  lot of stool in her colon on plain films of her abdomen. However, on rectal  exam there was no stool in her vault. It was felt this was all outside, to  be above the rectosigmoid junction. She had lost 10 pounds,  most of which  was felt to be due to dehydration. The patient has polyuria. She is in the  process of being evaluated by Dr. Caryn Section of [facility] for nephrogenic TR but  has not completed evaluation on account of her husband's illness. The  patient was hospitalized. She was given D5 normal saline. Her hyponatremia  gradually corrected. She was able to keep her liquids and full liquids down.  However, she is still not having bowel movements. She was given Fleet's  enema, followed by milk of molasses enemas but with minimal success. She  finally had a Gastrografin and enema to make sure we are not dealing with a  colonic obstruction. She had large fecal balls although contrast did pass  all the way to her cecum and there were no gross abnormalities or stricture.  She did have some results following this study and she passed some flatus,  although she did not have what I would considered satisfactory results. She  was tolerating a diet. She felt that she was feeling a lot better and wanted  to go home. She felt that she needed to be at home in order to care for  husband would just had back surgery. Her free discharge serum sodium was  133. BUN was down to 28 and creatinine was 1.4. While in the hospital she  was complaining of right shoulder pain, resulting from a recent fall. Her  films have been negative for fracture at [facility]. She was  given a Lidoderm patch with some relief of her pain. She had a urine sample  obtained from her ileoconduit on admission. It had 11-20 WBC's and was  positive for nitrites. She was therefore given ciprofloxacin. The final  result on the culture was pending at the time of discharge. The patient was  advised to follow with Dr. Caryn Section  as planned. She will be she returning to our  office and begun on ciprofloxacin. The culture was reported after the  patient was discharged at crew Klebsiella and E. Coli, both of which were  sensitive to ciprofloxacin .The patient will return to our office in few  weeks.      NR/MEDQ  D:  10/22/2004  T:  10/22/2004  Job:  [id number]   cc:   Rhae Lerner. Margretta Ditty, M.D.  [address]  [place]  [place] [address]   Wilber Bihari. Caryn Section, M.D.  [address]  [place]  [place] [address]  Fax: [phone]

## 2010-11-10 NOTE — H&P (Signed)
[facility]  Patient:    [name], [name] Visit Number: [id number] MRN: [id number]          Service Type: MED Location: 3A A319 01 Attending Physician:  Herbert Seta Dictated by:   Tana Coast, P.A. Admit Date:  07/31/2001                           History and Physical  DATE OF BIRTH:  08-Jun-1941.  CHIEF COMPLAINT:  Nausea, vomiting, light-headed.  HISTORY OF PRESENT ILLNESS:  Patient is a 70 year old Caucasian female who has multiple medical problems including chronic abdominal pain, chronic constipation, chronic gastroesophageal reflux disease, cholelithiasis, history of recurrent pyelonephritis and chronic renal insufficiency who presents today to the emergency department with a 1 week history of persistent nausea and vomiting.  She has been unable to take very much oral intake.  She denies any fevers or chills, diarrhea.  She has actually been constipated and has not had a bowel movement in about 6 days.  She complains of left lower quadrant pain which she has had chronically and admits that there has not been much change in this pain.  She also complains of pain between her shoulder blades.  This has been occurring for the last several weeks.  She has lost approximately 7 pounds over the past 1 week.  She complains of heartburn, but no dysphagia or odynophagia.  She reports having a little of bit bright red blood in her emesis on a couple of occasions, the last time being this morning.  Prior to this past week she was generally having a bowel movement every 3rd to 4th day.  She takes miralax daily and Correctol p.r.n.  She is known to have cholelithiasis and was recently seen by Dr. Myles Gip, who is a Careers adviser at [facility] for possible cholecystectomy.  He has done most of her previous abdominal surgeries.  He did not feel that her symptoms were related to cholelithiasis and has asked [place] to further evaluate her  with a barium enema and EGD.  In the emergency department she was found to have BUN of 11, and creatinine of 4.2.  Her baseline BUN is generally around 20 to 30, creatinine generally less than 2.0.  She is also hyponatremic with a sodium of 120.  Otherwise CBC and LFTs, amylase and lipase were normal.  On acute abdominal series, preliminary results there was no evidence of bowel obstruction or ileus.  There was a 3 mm calculus seen in the right upper abdomen probably in her gallbladder as she is status post right nephrectomy.  There was also a nodularity versus scarring seen in the left upper lobe and it was recommended to have a follow up CT scan.  She was noted to have hypotension with a blood pressure of 70/53 lying down. Heart rate 73.  In sitting position her blood pressure was 76/42 and heart rate 74, and in standing position her blood pressure was 91/53 and heart rate 72.  CURRENT MEDICATIONS:  1. Prilosec 20 mg 2 tablets q.a.m.  2. Bactrim double strength q.d.  3. doxepin 150 mg q.h.s.  4. Xanax 0.5 mg q.i.d.  5. Hydrocodone q.i.d.  6. Multivitamin q.d.  7. Miralax 17 grams q.d.  8. Caltrate 2 tablets q.d.  9. Domperidone 10 mg t.i.d. 10. Correctol p.r.n.  ALLERGIES:  No known drug allergies.  PAST MEDICAL HISTORY:  1. She has a history of  chronic constipation.  2. Chronic abdominal pain.  3. Gastroesophageal reflux disease.  4. Chronic renal insufficiency.  5. 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  6. She also has had a history of renal tubular acidosis, nephrogenic.  7. Diabetes insipidus.  She is seen by Dr. Rito Ehrlich for this.  8. She has had asymptomatic cholelithiasis and recently was evaluated by her     surgeon Dr. Myles Gip at [facility].  He felt that her     symptoms were not related to this.  9. She has insomnia and depression anxiety, Raynauds phenomenon. 10. She also has history of  ataxia and tremor which was evaluated by Dr.     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 11. She was hospitalized in 1998 with a colonic obstruction secondary to fecal     impaction. 12. In October 1998 she had an EGD which revealed erosive reflux esophagitis     by Dr. Benjie Karvonen, M.D., at [facility]. 13. Flexible sigmoidoscopy in September 1998 was normal to 45 cm except for 1     diverticuli and 2 polyps.  PAST SURGICAL HISTORY:  1. She had a radical cystectomy (secondary to interstitial cystitis) with     urinary diversion with ileal conduit.  2. She is status post right nephrectomy.  3. Status post hysterectomy.  4. C-section.  5. Multiple intra-abdominal operations for adhesions and abscesses with bowel     obstructions.  6. She has had a skin grafting to her left lower quadrant region as well     secondary to infection/gangrene.  7. Appendectomy.  FAMILY HISTORY:  Mother is deceased and had a history of arthritis.  Her father died of stomach cancer in her 77s. She had a brother who died of heart attack at age 89 and he also had a history of tuberculosis.  There is 10 living siblings.  She denies any family history of diabetes mellitus, colorectal cancer, liver disease, chronic GI illnesses.  SOCIAL HISTORY:  She is married and has 3 children.  She is disabled.  She quit smoking more than 5 years ago.  Denies any alcohol use.  REVIEW OF SYSTEMS:  Please see HPI.  In addition she complains of poor urinary output filling only 1/3rd of a cup all day yesterday.  Her urine is very dark in color.  She also is complaining of cramps in her hands, feet, and legs. She has some lightheadedness and blurred vision as well.  Otherwise negative.  PHYSICAL EXAMINATION:  VITAL SIGNS:  Temperature 96.5, pulse 70, respirations 20, blood pressure 102/63.  GENERAL:  A very pleasant,  well-nourished, well-developed Caucasian female in no acute distress.  SKIN:  Warm and dry.  No jaundice.   HEENT:  Pupils, equal, round, reactive to light.  Conjunctivae are pink. Sclerae nonicteric.  Oropharyngeal mucosal moist and pink.  No lesions, erythema, or exudate.  No lymphadenopathy, thyromegaly or carotid bruits.  CHEST:  Lungs clear to auscultation.  CARDIAC:  Regular rate and rhythm, normal S1, S2.  No murmurs, rubs or gallops.  ABDOMEN:  Positive bowel sounds.  Abdominal contour is asymmetrical secondary to multiple abdominal surgeries and skin grafting.  In the left lower quadrant she is noted to have a weakened area there but it is difficult to ascertain whether she has a hernia.  She is  slightly tender in this area to deep palpation and no organomegaly or masses.  RECTAL:  Reveals tight sphincter tone.  No stool in the rectal vault. Secretions are Hemoccult negative.  LABORATORY DATA:  WBC 10.3, with 63% neutrophils, and 30% lymphocytes. Hemoglobin 14.5, hematocrit 41.5.  Platelets ______.  Sodium 120, potassium 3.7, BUN 11, creatinine 4.2, glucose 117, total bilirubin 0.5, alk. phos. 84, SGOT 24, SGPT 29, albumin 4.0, amylase 121, lipase 43.  IMPRESSION:  The patient is a 70 year old Caucasian female who has multiple medical problems specifically chronic GI symptoms.  She has had recurrent episodes of nausea and vomiting with dehydration in the past.  With her history of recurrent pyelonephritis this would remain high on the differential. Urinalysis was pending at the time of this dictation.  Other possibilities would include partial bowel obstruction, however, there is no evidence on abdominal films today.  She was recently evaluated by her surgeon for possible cholecystectomy for a known cholelithiasis, however, he did not feel that her symptoms were secondary to this.  So she is also dehydrated and needs to have IV fluids.  Moderate hyponatremia.  #1 -  PERSISTENT NAUSEA AND VOMITING IN SETTING OF CHRONIC INTERMITTENT NAUSEA AND VOMITING:  May be secondary to pyelonephritis especially with a history of recurrent pyelonephritis.  Other etiologies would include partial small-bowel obstruction, however, no evidence seen on abdominal films today.  She is known to have cholelithiasis, however.  She was recently evaluated by Dr. Myles Gip, her surgeon at [facility] who felt that her symptoms were not related to cholelithiasis.  #2 - DEHYDRATION.  BUN usually runs in the 20-30 range and creatinine usually less than 2.0.  She has decreased urinary output as well.  Dehydration is secondary to #1.  #3 - HYPONATREMIA. #4 - GASTROESOPHAGEAL REFLUX DISEASE WITH BREAKTHROUGH HEARTBURN SYMPTOMS. #5 - MILD HEMATEMESIS POSSIBLY SECONDARY TO MALLORY-WEISS TEAR. #6 - CHRONIC CONSTIPATION. #7 - ANXIETY/DEPRESSION/INSOMNIA.  PLAN:  1. Will admit for IV hydration for symptomatic control of her symptoms.  2. Will resume home medications to include Xanax 0.5 mg t.i.d., doxepin 150     mg q.d.  3. Will start protonix 40 mg p.o. q.d.  4. Follow up on urinalysis.  5. Phenergan and Nubain for symptomatic control. Please see written orders     by Dr. Lora Havens.  6. The patient will need to have a chest CT to further evaluate nodularity     seen in the left upper lung on chest x-ray today.  7. IV Cipro as ordered by Dr. Lora Havens. Dictated by:   Tana Coast, P.A. Attending Physician:  Herbert Seta DD:  07/31/01 TD:  07/31/01 Job: [id number]

## 2010-11-10 NOTE — H&P (Signed)
**Note Melody via Obfuscation** [name], [name] NO.:  [id number]   MEDICAL RECORD NO.:  [id number]          PATIENT TYPE:  INP   LOCATION:  A332                          FACILITY:  [facility]   PHYSICIAN:  Lonia Blood, M.D.      DATE OF BIRTH:  06/19/1941   DATE OF ADMISSION:  03/05/2006  DATE OF DISCHARGE:  LH                                HISTORY & PHYSICAL   PRESENTING COMPLAINT:  Abdominal pain, nausea, vomiting and weight loss for  a couple of weeks.   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  The patient has had chronic abdominal pain for weeks.  She has been seen as  an outpatient and in the ED.  She went to Dr. Inge Rise office yesterday and  had blood work which came back showing low potassium.  She was called and  asked to come to the emergency room.  Her husband believes that the patient  has failed to thrive and has been going downhill for weeks.   PAST MEDICAL HISTORY:  Significant for chronic kidney disease, solitary  kidney with diversion ureterostomy.  Osteoporosis, chronic abdominal pain,  recurrent nausea and vomiting, anxiety disorder, GERD, chronic constipation,  insomnia.   ALLERGIES:  The patient is allergic to MORPHINE.   CURRENT MEDICATIONS:  Actonel 30 mg q.weekly, Macrodantin 50 mg daily,  Glucolax 17 grams daily, alprazolam 0.05 mg q.i.d., doxepin 150 mg q.h.s.,  Vicodin 5/500, 1 tablet q.6h., calcium 600 mg b.i.d., atenolol 25 mg daily,  Zegrid 40 mg daily, Prilosec 1 tablet daily, multivitamin 1 tablet daily.   SOCIAL HISTORY:  The patient is married and lives with her husband.  Denies  any tobacco or alcohol use.   FAMILY HISTORY:  Noncontributory.   REVIEW OF SYSTEMS:  Mainly weakness and weight loss.  Otherwise, per HPI.   PHYSICAL EXAMINATION:  VITAL SIGNS:  The is afebrile with a temperature of 97.2, blood pressure  121/66, pulse 96, respiratory rate  20, sat 99% on room air.  The patient has  no orthostasis.  GENERAL:  She is awake, alert, oriented, in no acute distress but looks  chronically ill.  HEENT: She has sunken eyes.  She has hyperemic mucosa with visible herpetic  lesions on the tongue.  NECK:  Her neck is supple.  No JVD, no lymphadenopathy.  RESPIRATORY:  Good air entry bilaterally.  No wheezes or rales.  CARDIOVASCULAR:  She has S1 and S2, no murmurs.  ABDOMEN:  Flat with an ileostomy visible with a bag.  Nontender with  positive bowel sounds.  EXTREMITIES: No edema, cyanosis or clubbing.   LABORATORY DATA:  White count is 7.9, normal differential, hemoglobin 12.3,  MCV 100.4, platelets 383.  Lipase is 58.  Sodium 139, potassium 2.9,  chloride 111, CO2 22, glucose 92, BUN 32, creatinine 2.1, calcium 9, total  protein 6.3, albumin 3.3, AST 34, ALT 29, alkaline phosphatase 74, total  bilirubin 0.2.  Urinalysis shows clear urine with moderate hemoglobin,  protein, positive nitrites, and positive leukocyte esterase,  WBCs 11-20,  RBCs 7-10, and many bacteria.  CT abdomen and pelvis showed status post  right nephrectomy, also, left hydronephrosis and hydroureter.  Also, mid  left ureteral obstruction of uncertain etiology with proximal left ureteral  dilatation and left hydronephrosis, questionable bowel wall thickening of  sigmoid colon, could not exclude colitis.  There is right lower quadrant  ostomy.   ASSESSMENT:  70 year old female with multiple medical problems but mainly  chronic abdominal pain presenting with recurrent abdominal pain, nausea,  vomiting, and hypokalemia.  The patient also has been losing weight.  Most  likely, the patient could be having a UTI that may have exacerbated this.  She may also be having hydronephrosis leading to worsening of her renal  function.  Her hypokalemia is most likely secondary to nausea and vomiting.   PLAN:  1. Abdominal pain, nausea, vomiting:  We will admit the patient.   We will      hydrate the patient adequately. Control her pain using a combination of      Dilaudid IV.  At this point, we will keep her on clear liquids as      necessary.  Once her nausea and vomiting resolves, we will advance her      diet as necessary.  We will also consult Dr. Dionicia Abler who knows the      patient very well for further evaluation of her GI symptoms.  2. Hypokalemia.  Will proceed with replacing the patient's potassium.  If      her potassium is resistant, will check magnesium to see if she is also      having other electrolyte abnormalities than we already know.  In the      meantime, however, will get nephrology involved and possibly urology at      some point due to hydronephrosis.  3. Osteoporosis.  Will hold her Actonel until the patient is discharged      out of the hospital.  4. Solitary kidney. As indicated, will get nephrology involved due to the      patient's solitary kidney.  Will also follow her renal function.  I      believe she has acute on chronic renal failure probably from prerenal      but also likely post renal with her hydronephrosis visible on her CT.  5. Insomnia.  I will treat the patient's insomnia using IV Ativan as much      as possible.  6. GERD.  Will continue her PPI while in the hospital.  7. Acute pyelonephritis.  The patient has evidence of kidney infection.      Will treat empirically with IV Cipro until we can get all cultures and      sensitivity back.  8. Protein calorie malnutrition.  Will encourage oral intake once the      patient resumes oral intake. If needed, will use Megace to stimulate      her appetite which is depressed at the present.  9. Other medical problems will be dealt with accordingly.  Once the      patient is stable, we will discharge her home with her husband.      Lonia Blood, M.D.  Electronically Signed    LG/MEDQ  D:  03/05/2006  T:  03/05/2006  Job:  [id number]   cc:   Lionel December, M.D.  [address]  [place]   [id number]

## 2010-11-10 NOTE — Discharge Summary (Signed)
[facility]  Patient:    [name], [name] Visit Number: [id number] MRN: [id number]          Service Type: MED Location: 3A A310 01 Attending Physician:  Malissa Hippo Dictated by:   Tana Coast, P.A.-C. Admit Date:  09/08/2001 Discharge Date: 09/11/2001                             Discharge Summary  CONTINUATION  MEDICATIONS AT TIME OF TRANSFER: 1. Protonix 40 mg IV q.24h. 2. Reglan 5 mg IV q.a.c. 3. Xanax 0.5 mg p.o. q.i.d. 4. Doxepin 150 mg p.o. q.h.s. 5. Nubain 10 mg IV q.4h. p.r.n. pain. 6. Ativan 1 mg q.6h. p.r.n. 7. Zofran 4 mg IV q.8h. p.r.n. 8. Vicodin one p.o. q.4h. p.r.n. 9. D-5 normal saline at 75 cc/hr. Dictated by:   Tana Coast, P.A.-C. Attending Physician:  Malissa Hippo DD:  09/12/01 TD:  09/15/01 Job: [id number]

## 2010-11-10 NOTE — H&P (Signed)
NAME:  [name], MALLIS                        ACCOUNT NO.:  [id number]   MEDICAL RECORD NO.:  [id number]                   PATIENT TYPE:  INP   LOCATION:  A315                                 FACILITY:  [facility]   PHYSICIAN:  Lionel December, M.D.                 DATE OF BIRTH:  07-04-1940   DATE OF ADMISSION:  09/18/2002  DATE OF DISCHARGE:                                HISTORY & PHYSICAL   CHIEF COMPLAINT:  Nausea, vomiting, weakness, postural dizziness and  abdominal pain.   HISTORY OF PRESENT ILLNESS:  The patient is a 70 year old, Caucasian female  with multiple medical problems who was in her usual state of health until  one week ago when she developed nausea and vomiting and she also had  diarrhea which lasted for a couple of days.  By day #3, she was feeling  better, however, nausea did not completely go away.  She also began to have  generalized cramping and abdominal pain at times stabbing.  She is not able  to eat or drink much.  She began to feel dizzy and lightheaded.  Her husband  checked her blood pressure at home and noted it was quite low.  She has lost  five pounds in the last one week.  She called her office and she was asked  to come to the emergency room.  She was noted to be hypertensive and she was  felt to be quite dehydrated.  She was therefore hospitalized for IV fluids  and further treatment.  She has had low-grade fever.  One day, she noted  odor to her urine.  She continues to have at least a half gallon of urine  every night.  Under normal circumstances, she is able to keep up with fluid  losses by drinking a lot of water, but with this sickness she has not been  able to do so.  She has noted increasing cough and she has also had some  heartburn.  She says she has not been able to keep her medications down for  the last couple of days.  She has not had a BM in four days.  She denies  hematuria, melena or rectal bleeding.  She also denies hematemesis,  chest  pain or dyspnea.   MEDICATIONS:  1. Vicodin one q.i.d.  2. Doxepin 150 mg q.h.s.  3. Xanax 0.5 mg q.i.d.  4. Bactrim DS q.d.  5. Nexium 40 mg b.i.d.   PAST MEDICAL HISTORY:  1. Chronic abdominal pain possibly related to multiple surgeries that she     had back in 1980 and 1981.  2. 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  She had leakage into her tissues and had a lot of     gangrene surgeries that followed.  At one point, the patient was felt to  be terminal and was on Demerol.  She has been on pain medications for     many, many years.  I did have her be seen at the [facility] in     [place].  This was not successful.  She was actually treated with     Vioxx and had a lot of side effects.  3. History of recurrent UTIs and is on suppressive therapy.  I have asked     her to see Dr. Rito Ehrlich multiple times, but she has not.  4. Increased urinary output felt to be due to perhaps chronic polyp.  5. Mild chronic renal failure.  6. Last year, she had cholecystectomy and laparotomy with lysis of adhesions     and resection of short section of small bowel at [facility].  7. She has Raynaud's phenomenon.  8. Insomnia.  9. Depression.  10.      Anxiety neurosis.  11.      Chronic constipation.  She is on MiraLax and has been using tap     water enemas for a long time.  12.      Chronic GERD.  13.      Cough, which has not responded to it.  We were planning to have her     see a pulmonologist, but she has not yet seen one in [place].   ALLERGIES:  No known drug allergies.   FAMILY HISTORY:  Father died of stomach cancer at age 29.  She has five  sisters and four brothers living.  One brother died of heart attack.  One  brother was just diagnosed with lung carcinoma last year.  He is 71 years  old.   SOCIAL HISTORY:  She is married and has three children.  She worked x12  years, but she has been disabled several years ago.   She smokes cigarettes  x12 years and quit about 10 years ago.  She does not drink alcohol.  She and  her husband live in [place], [place].   PHYSICAL EXAMINATION:  GENERAL:  Well-developed, thin, Caucasian female who  is in no acute distress.  She weighs 128.8 pounds, height 5 feet 5-1/2  inches tall.  VITAL SIGNS:  Pulse 76, blood pressure now 114/61.  On arriving in the  emergency room, blood pressure was 82/50.  Temperature 98.1, respirations  16.  HEENT:  Conjunctivae pink, sclerae nonicteric.  Oropharyngeal mucosa is dry.  NECK:  Without masses or thyromegaly.  JVD is flat.  Supple.  CARDIAC:  Regular rhythm.  Normal S1, S2, no murmur or gallop noted.  LUNGS:  Clear to auscultation.  ABDOMEN:  Somewhat full, bowel sounds normal.  She has some urine in bag  which is connected to her ileal conduit bag.  She has mild tenderness, which  is more or less generalized, but no guarding or rebound noted.  She has  extensive scarring in the left lower quadrant of her abdomen.  EXTREMITIES:  Without peripheral edema or skin rash.  She does not have  clubbing.   LABORATORY DATA AND X-RAY FINDINGS:  WBC 10.5, H&H 14.3 and 43.1, platelet  count 282,000.  Sodium 133, potassium 4.5, chloride 109, CO2 20, glucose  110, BUN 49, creatinine 2.2, calcium 9.3.  Urinalysis reveals 21-50 wbc's  per high power field.  I am not sure this is right because it was taken from  the bag.  She has specific gravity of 1.004, negative nitrites, negative  leukocyte esterase.  Total protein  7.4 with albumin 3.9, bilirubin 0.5, Alk  phos 119, AST 22, ALT 20.   Acute abdominal series reviewed.  There is some stool in the colon and few  tiny air fluid levels.  There is no pneumoperitoneum.  Chest x-ray is  negative for pneumonia.   ASSESSMENT:  The patient is suspicious for protracted gastroenteritis which  would be viral or food born.  I doubt we are dealing with partial bowel obstruction, but she is at risk to  develop this on account of multiple  surgeries and extensive adhesions.  She has azotemia.  She could also have  pyelonephritis.    PLAN:  1. Will obtain urine culture and start her on Cipro 4 mg IV q.12h.  2. She will be IV fluids and IV Protonix.  3. Will give her IV Nubain and Xanax until she is able to tolerate p.o.     medications.                                               Lionel December, M.D.    NR/MEDQ  D:  09/18/2002  T:  09/19/2002  Job:  [id number]

## 2010-11-10 NOTE — Consult Note (Signed)
[name], [name] NO.:  [id number]   MEDICAL RECORD NO.:  [id number]          PATIENT TYPE:  INP   LOCATION:  A332                          FACILITY:  [facility]   PHYSICIAN:  Dennie Maizes, M.D.   DATE OF BIRTH:  01/01/1941   DATE OF CONSULTATION:  DATE OF DISCHARGE:                                   CONSULTATION   REASON FOR CONSULTATION:  Left hydronephrosis, chronic renal failure.   HISTORY OF PRESENT ILLNESS:  This 70 year old female is known to me from  prior evaluation and management.  She has had multiple GI as well as  abdominal surgeries in the past.  She has been admitted to hospital with  abdominal pain, nausea, vomiting and weight loss.  She was seen in the  office by Dr. Karilyn Cota a few weeks ago with urinary tract infection and she  was treated with Cipro.  She was found to have hypokalemia and she has been  admitted to hospital now.  Her past GU history is significant for cystectomy  with ileal conduit urinary diversion for interstitial cystitis done at [facility].  The patient had postoperative complications.  She has undergone right nephrectomy.  She has had multiple surgeries at [facility] for revision of ileal conduit, multiple bowel  adhesions, as well as fistula.  She has had multiple admissions to [facility] for dehydration and recurrent urinary tract infections.  She  has chronic renal failure with a creatinine ranging between 1.6 to 1.8.  Her  x-rays in 2004 revealed chronic left hydronephrosis.  The patient denied  having any fever or chills at present.  The urine is clear.  She has good  urine output.   PAST MEDICAL HISTORY:  1. Status post cystectomy with ileal conduit urinary diversion for      interstitial cystitis.  2. Status post right nephrectomy.  3. History of revision of ileal conduit.  4. Multiple abdominal surgeries for adhesions, bowel obstruction and      fistula.  5.  Status post cholecystectomy.  6. Status post hysterectomy.  7. Status post appendectomy.  8. History of depression, anxiety.  9. GERD.   MEDICATIONS:  1. Actonel 30 mg weekly.  2. Macrodantin 50 mg one daily.  3. GlycoLax 17 g p.o. daily.  4. Alprazolam 0.5 mg p.o. q.i.d.  5. Doxepin 150 mg p.o. q.h.s.  6. Vicodin 5/500 one p.o. q.6h. p.r.n. pain.  7. Calcium 600 mg p.o. b.i.d.  8. Atenolol 25 mg p.o. daily.  9. Zegerid 40 mg p.o. daily.  10.Prilosec one tablet daily.  11.Multivitamin one tablet daily.   She is allergic to MORPHINE.   EXAMINATION:  Abdomen is soft.  No palpable flank mass.  No CVA tenderness.  Ileal conduit is draining clear urine.   ADMISSION LABORATORIES:  BUN 23, creatinine 1.8.  Urinalysis:  Blood  moderate, nitrites positive, wbc's 11-20 per high-power field, rbc's 7-10  per high-power field, bacteria many.  CBC:  Wbc 5.7, hemoglobin 11.0,  hematocrit 32.5.  The patient is on IV Cipro at present.  CT scan of the  abdomen and pelvis without contrast revealed normal sized left kidney with  left hydronephrosis and hydroureter.   IMPRESSION:  Solitary left kidney, left hydronephrosis and hydroureter,  status post ileal conduit urinary diversion, chronic renal failure,  recurrent urinary tract infection.   PLAN:  The patient's renal function is stable compared to the previous  values.  She has chronic left hydronephrosis.  The hydronephrosis might be  secondary to stricture at the level of ureteroileal junction.  I suggest  that the patient to go back to [facility] for further  evaluation and management.   Thanks for this consult.      Dennie Maizes, M.D.  Electronically Signed     SK/MEDQ  D:  03/06/2006  T:  03/06/2006  Job:  [id number]

## 2010-11-10 NOTE — Discharge Summary (Signed)
NAMESEASON, [name]              ACCOUNT NO.:  [id number]   MEDICAL RECORD NO.:  [id number]          PATIENT TYPE:  INP   LOCATION:  A323                          FACILITY:  [facility]   PHYSICIAN:  R. Roetta Sessions, M.D. DATE OF BIRTH:  02-26-1941   DATE OF ADMISSION:  05/21/2004  DATE OF DISCHARGE:  11/30/2005LH                                 DISCHARGE SUMMARY   DISCHARGE DIAGNOSES:  1.  Klebsiella urinary tract infection improved with treatment.  2.  Nausea, vomiting, weight loss, azotemia secondary to #1, improved.  3.  Hyponatremia and Hypokalemia secondary to #2, markedly improved.  4.  Constipation.  5.  History of chronic abdominal pain.  6.  History of right nephrectomy with radical cystectomy in distant past.  7.  History of gastroesophageal reflux disease.  8.  History of anxiety, insomnia, and depression.   DISCHARGE DIET:  As tolerated.  Adequate fluid intake emphasized.   DISCHARGE ACTIVITY:  Gradually resume usual activity.   DISCHARGE MEDICATIONS:  1.  Vicodin 5/500 mg q.i.d. p.r.n. pain.  2.  Xanax 0.5 mg q.i.d. p.r.n.  3.  Prilosec OTC 20 mg orally daily.  4.  Doxepin 150 mg at bedtime.  5.  MiraLax 17 g orally daily p.r.n. constipation.  6.  Centrum one vitamin supplement daily.  7.  Cipro 250 mg b.i.d. x7 days.   DISCHARGE FOLLOWUP:  Dr. Karilyn Cota December 5 at 2:30 p.m.   HOSPITAL COURSE:  [name] is a 70 year old lady well known to our  practice admitted to the hospital May 21, 2004 recent acute illness  characterized by weight loss, dehydration, acute on chronic renal failure  (predominantly secondary to azotemia), nausea, vomiting, abdominal pain.  She had a mild leukocytosis and hyponatremia on presentation.  She was  admitted to the hospital for further evaluation/management.  Urine culture  grew out 75,000 CFUs of Klebsiella pneumoniae sensitive to Cipro.  She was  started on IV Cipro which was changed to the oral form on May 24, 2004.  Blood cultures obtained were negative.  Ultrasound of the abdomen  demonstrates she is status post right nephrectomy.  Mild left-sided  hydronephrosis stable compared to prior CT scan  Her weight on November 29  was 118.9 pounds, day of discharge was 122.9 pounds.  She was hyponatremic  on admission.  This responded to IV fluids nicely.  Nausea, vomiting  resolved rapidly during hospitalization.  She had reached her baseline by  May 24, 2004.  Serum magnesium was normal at 2.2.  Follow-up  electrolytes on the day of discharge:  Sodium 141, potassium 4.3, chloride  115, CO2 24, BUN 115, creatinine 1.8 (was 2.7 with a BUN of 47 on  admission).   The patient was afebrile on the day of discharge taking her diet very well  and had stable vital signs.   She was discharged in a much improved condition.  Adequate oral fluid intake  was emphasized and she was urged to keep her appointment with Dr. Karilyn Cota.     Otelia Sergeant   RMR/MEDQ  D:  05/24/2004  T:  05/24/2004  Job:  [id number]

## 2010-11-10 NOTE — Discharge Summary (Signed)
[name], [name] NO.:  [id number]   MEDICAL RECORD NO.:  [id number]                    PATIENT TYPE:   LOCATION:                                       FACILITY:   PHYSICIAN:  Lionel December, M.D.                 DATE OF BIRTH:   DATE OF ADMISSION:  09/18/2002  DATE OF DISCHARGE:  09/21/2002                                 DISCHARGE SUMMARY   DISCHARGE DIAGNOSES:  1. Nausea and vomiting secondary to self-limiting gastroenteritis.  2. Dehydration with hypotension secondary to above.  3. Chronic abdominal pain.  4. Urinary tract infection.  5. Chronic renal failure with impaired renal concentration mechanism.  6. Chronic insomnia.  7. Depression.  8. Chronic reflux esophagitis.  9. Mild hypokalemia.  10.      Mild hydronephrosis involving left kidney, possibly chronic.  11.      Chronic constipation.   DISCHARGE CONDITION:  Much improved.   DISCHARGE MEDICATIONS:  1. Cipro 500 mg p.o. b.i.d. for five days and then she will go back on     Bactrim DS one daily for prophylaxis.  2. Vicodin 1 q.i.d. p.r.n.  3. Xanax 1 q.i.d.  4. Nexium 40 mg b.i.d.  5. Doxepin  150 mg q.h.s.  6. Betalax 17 grams daily as needed.   HOSPITAL COURSE:  [name] is a 70 year old Caucasian female with multiple  medical problems, who presented to the ER with one week history of nausea  and vomiting.  Early on, she had diarrhea, but this resolved.  Her husband  noted her to be dizzy and lightheaded and her pressure was noted to be very  low.  She had lost five pounds in one week.  She was initially evaluated in  the emergency room and felt quite dehydrated.  She was azotemic.  Her BUN  was 49, creatinine was 2.2.  Her WBC was 10.5, H&H was 14.3 and 43.1,  platelet count was 282.  Her LFT's are normal except borderline alkaline  phosphatase, serum magnesium was 1.8.  Amylase and lipase were normal.  She  was therefore, hospitalized for further evaluation.  Urinalysis  revealed 21-  50 WBC's.  Specimen was taken from conduit, so it is difficult to tell if  this was very reliable.  Acute abdominal series revealed no evidence of  pneumoperitoneum.  She had some stool in her colon.  Chest x-ray suggested  changes of COPD, but there was no pneumonia. She was begun on IV fluids and  urine cultures obtained and she is also given Cipro.  Over the next few  days, she improved.  She had very little amount of bowel movement, even with  enemas.  She was passing flatus.  She was begun on liquid diet which she  tolerated.  Diet was advanced.  Her IV meds were switched to p.o. which she  tolerated well.  Her potassium dropped  with IV fluids and this was easily  replaced with IV and oral potassium.  Her BUN dropped to 20 and creatinine  was 1.4.  She than advanced creatinine of 1.6 to 1.7.  This was felt to be  because she was well hydrated.  Renal ultrasounds obtained.  Showed mild  left renal hydronephrosis.  This was the patient's only kidney.  She has had  right nephrectomy with radical cystectomy in the past.  As far as abdominal  pain is concerned, she was having acute and chronic abdominal pain secondary  to illness.  She has been dependent on narcotics for many years.  Previous  attempts trying to control her pain with other measures have not been  successful.  The patient tolerated her diet well and she ambulated on the  floor without any postural symptoms.  She was discharged.  I reviewed her  condition with Dr. Jeralene Huff and encouraged the patient to see Dr. Jeralene Huff in  the near future.  She will make an appointment to see him.  She has seen Dr.  Jeralene Huff  multiple times over the years, but not in the last one year.  I  felt that her mild left hydronephrosis was chronic, but wanted Dr. Jeralene Huff  to review the situation.  While she was in the hospital, he also did urinary  output and she had over 2 liters of urine every 24 hours.  I felt that this  was due to  renal failure and loss of renal concentration mechanism and this  is probably the reason when she got profoundly dehydrated because she could  not keep anything down and she was having large urine output.  The patient  will increase oral intake like she has done in the past.  She is to return  one month from now.                                                Lionel December, M.D.    NR/MEDQ  D:  09/25/2002  T:  09/25/2002  Job:  [id number]   cc:   Dr. Jeralene Huff

## 2010-11-10 NOTE — Consult Note (Signed)
[name], [name]              ACCOUNT NO.:  [id number]   MEDICAL RECORD NO.:  [id number]          PATIENT TYPE:  INP   LOCATION:  A323                          FACILITY:  [facility]   PHYSICIAN:  R. Roetta Sessions, M.D. DATE OF BIRTH:  1941/04/12   DATE OF CONSULTATION:  DATE OF DISCHARGE:                                   CONSULTATION   HISTORY OF PRESENT ILLNESS:  The patient is a 70 year old Caucasian female  with a complicated past medical history as outlined below, who presented  with a two-week history of constipation.  At baseline, she generally has a  bowel movement one to two times weekly.  Over the last two weeks, she has  not had any bowel movement.  She tried multiple agents including MiraLax, x-  lay, Correctol, magnesium citrate and Fleet's enemas.  She has had no  results.  She has had some vague abdominal aching, but really no abdominal  writhing-type pain.  She has had some epigastric discomfort and worsening of  her reflux symptoms and nausea, but no vomiting.  She denies any melena or  rectal bleeding, fever or chills.  She had an acute abdominal film yesterday  which revealed marked, retained colonic stool, but otherwise a normal bowel  gas pattern.  There was no evidence of active chest disease.  The patient  reports having a rectal examination in the emergency department and was told  she had hard stool present.  I did not see any documentation.   MEDICATIONS PRIOR TO ADMISSION:  1.  Macrodantin daily.  2.  Xanax 0.5 mg q.i.d.  3.  Doxepin 150 mg daily.  4.  Lortab 5/500 q.i.d. p.r.n.  5.  Nexium 40 mg daily.  6.  MiraLax.  7.  Other laxatives recently as outlined above.  8.  Preparation H p.r.n.   ALLERGIES:  No known drug allergies.   PAST MEDICAL HISTORY:  History of chronic abdominal pain.  She has had  multiple abdominal surgeries.  The last one was in March of 2003 when she  had a cholecystectomy and lysis of adhesions and lysis of a segment of her  small bowel.  This was [facility] at [facility].  She has also had a  right nephrectomy and radical cystectomy over 20 years ago, and has an  ileoconduit.  She has Raynaud's phenomenon.  She has chronic depression,  insomnia, anxiety and chronic gastroesophageal reflux disease.  She has a  dependence on narcotics.  Previous attempts at getting her off narcotic  therapy has been unsuccessful.  She has [facility],  once in [place], [place].  She has chronic constipation.  She has  chronic renal failure.  She has been treated for multiple urinary tract  infections.  She is followed by Dr. Hollice Espy.   FAMILY HISTORY:  Noncontributory.  She has lost one brother last year to  lung carcinoma.  He was 26 years old.  One brother died of myocardial  infarction.   SOCIAL HISTORY:  She is married and has three children.  She has been  disabled  for years.  She smoked cigarettes for 12 years, but quit over 11  years ago.  She denies any alcohol use.  She lives with her husband in [place].   REVIEW OF SYSTEMS:  Please see HPI for GI.  CARDIOPULMONARY:  Denies any  chest pain or shortness of breath.   PHYSICAL EXAMINATION:  VITAL SIGNS:  Temperature 97.6, pulse 72,  respirations 16, blood pressure 138/75.  Weight 124.9.  Height 65 inches.  GENERAL:  A pleasant, thin, Caucasian female, in no acute distress.  SKIN:  Warm and dry, no jaundice.  HEENT:  Conjunctivae are pink.  Sclerae are nonicteric.  Oropharyngeal  mucosa moist and pink.  No lesions, erythema or exudate.  CHEST:  Lungs are clear to auscultation.  CARDIAC:  Exam reveals regular rate and rhythm, normal S1 and S2.  No  murmurs, rubs or gallops.  ABDOMEN:  Positive bowel sounds, flat, nondistended.  Clear urine in bag.  Ileoconduit is in the right lower quadrant.  Bowel sounds are normal.  Abdomen is soft.  She has some mild, diffuse tenderness to deep palpation.  She has extensive scarring which is  a chronic finding.  RECTAL:  Examination performed in the emergency department.  EXTREMITIES:  No edema.   LABORATORY DATA:  Sodium 134, potassium 4, BUN 16, creatinine 1.4, glucose  75.  Total bilirubin 0.4.  Alkaline phosphatase 92.  SGOT 18, SGPT 19.  Albumin 3.6.  Calcium 9.7.  WBC 9.9, hemoglobin 12, hematocrit 35.7,  platelets 210,000.  PT 11.9, INR 0.8, PTT 25.   IMPRESSION:  Ms. [name] is a 70 year old lady who presents with a two-week  history of worsening constipation.  She has had no bowel movement in two  weeks.  She has tried multiple laxatives and took two-third of GoLYTELY in  the evening with no results.  She has a history of abdominal adhesions  requiring resection in the past.  She had a recent incomplete colonoscopy at  [facility] two months ago which was followed by a barium enema.  According to the  patient, there was no significant finding except for suspected adhesions.  The patient at this point is not exhibiting any signs of obstruction.   RECOMMENDATIONS:  1.  TSH.  2.  Two Fleet enemas now.  3.  Follow up after enemas with regards to further recommendations.     [name]   LL/MEDQ  D:  04/05/2004  T:  04/05/2004  Job:  [id number]   cc:   Nicoletta Dress. Colon Branch, M.D.  [address] [place]  [place] [address]  Fax: [phone]   Vania Rea, M.D.

## 2010-11-10 NOTE — Consult Note (Signed)
NAME:  RUPAL, CHILDRESS NO.:  [id number]   MEDICAL RECORD NO.:  [id number]          PATIENT TYPE:  INP   LOCATION:  A332                          FACILITY:  [facility]   PHYSICIAN:  Kassie Mends, M.D.      DATE OF BIRTH:  May 11, 1941   DATE OF CONSULTATION:  03/06/2006  DATE OF DISCHARGE:                                   CONSULTATION   REQUESTING PHYSICIAN:  [facility]   HISTORY OF PRESENT ILLNESS:  Patient is a 70 year old Caucasian female with  complicated past medical history as outlined below, who was seen in the  emergency department on February 28, 2006 with nausea, vomiting, weakness.  She was found to have an elevated creatinine from her baseline (1.5 to 1.8)  to 2.3.  She was hydrated and sent home.  She states for the last several  days she has had continued nausea and vomiting.  She just has been feeling  really badly.  We had her do blood work as an outpatient yesterday, which  revealed a potassium of 2.8 and a creatinine of 2.2.  Because she was not  maintaining POs, we have asked her to go back to the emergency department  for evaluation, and she subsequently was admitted.  She also has a history  of a 20-pound weight loss in the last several months.  She generally was  feeling well up until a couple of weeks ago shen she started having  postprandial nausea/vomiting and worsening of her chronic abdominal pain.  She has been taking only clear liquids or full liquids at home for a couple  of weeks.  She continues to have a bowel movement about three times a week  which is her normal.  She does complain of rectal pain which she feels is  due to her hemorrhoids, as well as bright red blood per rectum at times.  She was evaluated with a non-contrast CT, which revealed a minimal  pericardial fluid or thickening, status post right nephrectomy, left  hydronephrosis and urethral dilatation, questionable bowel wall thickening  of the sigmoid colon.  She is  feeling better this morning.  She has  tolerated some clear liquids, clear liquid diet.   HOME MEDICATIONS:  1. Macrodantin 50 mg q.d.  2. Actonel 30 mg q. week.  3. MiraLax 17 g q.d.  4. Xanax 0.05 mg q.i.d.  5. Doxepin 150 mg q.h.s.  6. Vicodin 5/500 q.i.d. p.r.n.  7. Calcium 600 mg b.i.d.  8. Atenolol 25 mg q.d.  9. Zegerid 40 mg q.d.  10.Tums p.r.n.  11.Prilosec q.d.  12.Multivitamin q.d.  13.Aspirin 81 mg q.d.  14.Phenergan 25 mg p.r.n.   ALLERGIES:  Morphine causes nervousness and shakiness.   PAST MEDICAL HISTORY:  Chronic GERD.  She has had EEG in the past few years.  She has a history of chronic abdominal pain, has had multiple prior  surgeries.  She had a right nephrectomy 1973 and a radial cystectomy with  ileal conduit a year later.  Her last abdominal surgery was about 4 to 5  years ago by Dr. Stefano Gaul  of [facility], when she had a cholecystectomy and  lysis of extensive adhesions in her lower abdomen.  She had a segment of her  small bowel resected at the time.  She has chronic insomnia, anxiety and  depression.  She has a history of Raynaud's Phenomenon but is having less  episodes at quitting smoking several years ago.  She has history of mild  renal failure.  Her creatinine generally runs between 1.5 and 1.8.  On a few  occasions when she has been admitted, her creatinine has been over 2, and  one occasion it was for but corrected with hydration.  She has a history of  recurrent urinary tract infections.  She has been on chronic antibiotic  therapy for suppression.  Ultrasound November 2005 revealed mild left  hydronephrosis, unchanged from prior study.  She has a history of  osteoporosis and arthritis.   FAMILY HISTORY:  Brother died of lung cancer, brother died of MI.   SOCIAL HISTORY:  She is married.  She is disabled.  She smoked for several  years but quit 11 years ago, does not drink alcohol.  She has 3 children.  Both of her daughters are doing fine.   She has a son who has drug abuse  problems but currently has been doing well.   REVIEW OF SYSTEMS:  See HPI for GI.  CONSTITUTIONAL:  Denies any fever or  chills, complains of weight loss as outlined above.   PHYSICAL EXAMINATION:  VITAL SIGNS:  Temp 98.7, pulse 82, respirations 18,  blood pressure 103/60, weight 105 pounds.  She had weighed 122 pounds in May  of 2006.  Height 65 inches.  GENERAL:  Pleasant, thin Caucasian female in no acute distress.  SKIN:  Warm and dry, no jaundice.  HEENT:  Conjunctivae are pink, sclerae nonicteric.  Oropharynx:  Mucosa  moist and pink.  CHEST:  Clear to auscultation.  CARDIAC: Reveals regular rate and rhythm.  No murmurs, rubs or gallops.  ABDOMEN:  Positive bowel sounds, soft.  She has minimal tenderness in the  left lower quadrant region which is a chronic finding.  No organomegaly or  masses.  No rebound tenderness or guarding.  She has clear urine within her  urinary bag in the right abdomen.  EXTREMITIES:  No edema.   LABORATORY:  White count 5,700, hemoglobin 11, hematocrit 11, hematocrit  32.5, platelets 385,000, MCV 100.7.  Potassium is up to 3.5 today.  Her BUN  is down to 23 from 32.  Her creatinine is down to 1.8, glucose 96, total  bilirubin 0.2, alkaline phosphatase 74, AST 34, ALT 29, albumin 3.3, lipase  58.  Urinalysis revealed moderate blood, protein of 30, nitrate positive,  trace leukocyte esterase, 11-20 WBCs, 7-10 RBCs and many bacteria.   IMPRESSION:  Patient is a 70 year old lady with complicated history as  outlined above.  She has a two-week history of postprandial acute on-chronic  abdominal pain.  No nausea or vomiting.  She has a several-month history of  approximately a 20-pound weight loss.  Non-contrast study as outlined above  with questionable bowel wall thickening and left hydronephrosis/urethral  dilatation.  It is unclear when her last colonoscopy was, but we will review all those records to determine if she  needs to have one now for her CT  findings.  Etiology of abdominal pain, nausea/vomiting is a UTI.  Her  creatinine is improving.  She is being followed by both nephrology and  urology.  She also  complains of stoma, blisters and pain and may need to  have surgical opinion or followup with [facility] in the near future.   RECOMMENDATIONS:  1. Allow clear liquids to full liquids today as tolerated.  2. Review office records.  3. Further recommendations to follow.      Tana Coast, P.A.      Kassie Mends, M.D.  Electronically Signed    LL/MEDQ  D:  03/06/2006  T:  03/06/2006  Job:  [id number]
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
## 2010-11-10 NOTE — Consult Note (Signed)
[facility]  Patient:    [name], [name] Visit Number: [id number] MRN: [id number]          Service Type: MED Location: 3A A327 01 Attending Physician:  Malissa Hippo Dictated by:   Maudry Diego, M.D. Proc. Date: 08/22/01 Admit Date:  08/22/2001 Discharge Date: 08/27/2001                            Consultation Report  REASON FOR CONSULTATION:  Renal insufficiency.  BRIEF HISTORY:  The patient is a 70 year old Caucasian female who has a past medical history of abdominal pain, chronic gastroesophageal reflux disease, and a history of gallstone, and a history of renal insufficiency.  Presently came with complaints of epigastric pain, cough, with possible bronchitis.  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 According to the patient it seems she also has recurrent bladder bleeding and states that her bladder was removed in 1974, and her left kidney was joined to an ileal conduit.  Since then the patient has had recurrent urinary tract infection, and also pyelonephritis, and cellulitis of her abdominal wall requiring extensive plastic surgery and placement of a left kidney to the right side.  At this moment her BUN and creatinine seems to be higher than her regular baseline creatinine of 1.4 to 1.5.  She was here in February where she was admitted with nausea and vomiting and during that time her BUN was greater than 100 with a creatinine of 4.2, which has improved with hydration.  When she came back this morning her BUN and creatinine again, was found to be elevated hence, consult is called.  At this moment she denies any shortness of breath, she feels better than the day she came.  She denies any nausea.  She denies any vomiting but difficulty in eating and she also states that she occasionally feels thirsty.  PAST MEDICAL  HISTORY: 1. As stated above.  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 2. Status post cystectomy and placement of ileal conduit. 3. Recurrent urinary tract infection and pyelonephritis. 4. History of chronic gastroesophageal reflux disease. 5. History of cholelithiasis and history of abdominal surgery with skin graft    placement on the left side. 6. History of constipation and abdominal pain. 7. 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 8. Status post hysterectomy and appendectomy.  MEDICATIONS:  Present home medications: 1. 1/2 normal ______ 150 cc/hour. 2. Protonix 40 mg p.o. q.a.m. 3. Doxepin 150 mg p.o. q.h.s. 4. Xanax 0.5 mg p.o. q.i.d. 5. Aminorex 17 grams p.o. q.d. 6. Vicodin 1 tablet p.o. q.d. p.r.n. 7. Rocephin 1 gram IV q.24h.  SOCIAL HISTORY:  The patient denies any smoking at this moment.  She used to smoke but quit about 5 years ago. She has 3 children and denies alcohol abuse.  FAMILY HISTORY:  Mother has heart disease. The only problem she had was a history of arthritis and now a history of kidney problem, also cancer.  REVIEW OF SYSTEMS:  The patient complains of cough and poor appetite and some epigastric pain.  No vomiting, no diarrhea, no urgency, frequency, no chest pain and occasional feeling of thirst.  PHYSICAL EXAMINATION:  GENERAL:  The  patient is alert, and in no acute distress.  Afebrile.  VITAL SIGNS:  Blood pressure 115/70.  CHEST:  Decreased breath sounds bilaterally otherwise seems to be clear.  HEART:  Regular rate and rhythm, no murmur.  ABDOMEN:  Soft, positive bowel sounds.  EXTREMITIES:  No edema.  URINALYSIS:  Specific gravity is 1.01, pH of 6, and she is nitrate positive, leukocyte negative, bacteria many.  LABORATORY DATA:  White blood cell count is 11.8, hemoglobin 12.4,  hematocrit 55.9, platelets 380, sodium 136, potassium 5.4, chloride 106, CO2 22, BUN 84, creatinine 2.4.  Calcium is 9.3, albumin 3.4.  ASSESSMENT: 1. RENAL INSUFFICIENCY.  Presently patient with high BUN to creatinine ratio    of greater than 20:1.  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  The possibility of ______ cannot be ruled out.  At this    moment the patient seems to have recurrent problem of pyelonephritis and    also have an increased urine output which is accompanied by thirst.    Most likely dehydration due to concentrating problem of the kidneys. 2. HIGH NORMAL POTASSIUM OF 5.4, low normal CO2.  Possibly metabolic acidosis    non-ion gap type, probably mild renal artery stenosis.  At this moment    sodium is normal. 3. HISTORY OF PYELONEPHRITIS STATUS POST RIGHT NEPHRECTOMY AND ALSO A    BLADDER SURGERY WITH ILEAL CONDUIT.  Possibly recurrent urinary tract    infection leading to interstitial nephritis and multiple electrolyte imbalance including metabolic acidosis, hyperkalemia and occasionally    hypernatremia. 4. GASTROESOPHAGEAL REFLUX DISEASE. 5. POSSIBLE BRONCHITIS.  RECOMMENDATION:  I agree with hydration.  We need to her follow her BUN and creatinine.  We will check her ultrasound of the kidney to rule out obstruction.  We need to encourage p.o. intake of fluid especially if she has concentrating difficulty. The patient may be losing loss of fluid through the ileal conduit and unless patient is able to drink and compensate her urine output she will have recurrent prerenal syndrome and a combined loss of her kidney function.  I will follow the patient with you. Dictated by:   Maudry Diego, M.D. Attending Physician:  Malissa Hippo DD:  08/22/01 TD:  08/22/01 Job: [id number]

## 2010-11-10 NOTE — Discharge Summary (Signed)
[facility]  Patient:    [name], [name] Visit Number: [id number] MRN: [id number]          Service Type: MED Location: 3A Y782 01 Attending Physician:  Malissa Hippo Dictated by:   Tana Coast, P.A. Admit Date:  11/02/2001 Discharge Date: 11/04/2001                             Discharge Summary  ADMISSION DIAGNOSES: 1. Acute nausea, vomiting, diarrhea. 2. Dehydration. 3. History of large volume output renal failure. 4. Chronic abdominal pain. 5. Chronic reflux esophagitis. 6. Anxiety and depression. 7. History of recurrent urinary tract infections. 8. Hypokalemia.  DISCHARGE DIAGNOSES: 1. Acute gastroenteritis. 2. Dehydration. 3. History of large volume output renal failure. 4. Chronic reflux esophagitis. 5. Chronic abdominal pain. 6. Anxiety and depression. 7. Anemia, stable. 8. Hypokalemia.  SERVICE:  Dr. Karilyn Cota.  CONSULTATIONS THIS ADMISSION:  Dr. Lovell Sheehan on Nov 03, 2001.  HISTORY OF PRESENT ILLNESS:  The patient is a 70 year old Caucasian female with multiple medical problems who developed acute onset of nausea, vomiting, and nonbloody diarrhea three days prior to admission. She was unable to keep down her medications. She was feeling very dizzy and light-headed when she stood up. She presented to the emergency department as requested by Dr. Karilyn Cota. She complained of 8-pound weight loss in the last 24 hours which was felt to be secondary to dehydration. Notably 3-4 weeks ago her weight was 128 pounds and on admission was 114.6. On admission, her hemoglobin was 12.4, hematocrit 38.7, WBC 7.3, platelets 420,000. Urinalysis revealed specific gravity of 1.010, moderate leukocyte esterase, 11-20 wbcs, few epithelial cells. Serum sodium was 135, potassium 3.3, chloride 108, CO2 20, glucose 104, BUN 38, creatinine 1.6, albumin 3.9, bilirubin 0.5, alkaline phosphatase 91, SGOT 23, SGPT 21. The patient was admitted for further  management to include potassium repletion and rehydration.  HOSPITAL COURSE:  The patient underwent acute abdominal series on the second day of admission which revealed nonspecific gas pattern with no signs of bowel dilatation or obstruction. There were no urinary tract calcifications. Chest x-ray with no acute abnormalities. She was started on Phenergan and Nubain for symptomatic relief. Potassium chloride was repleted in her IV fluids. Stool was ordered for wbc culture, O&P however was not collected as the patients diarrhea had ceased. She was started on Protonix 40 mg IV, resumed her home medications to include Xanax and doxepin. She was started on Cipro 200 mg IV q.12 h. empirically for a UTI awaiting a urine culture. Serum magnesium was obtained and revealed normal result of 2.0. On day two of admission, her potassium had increased to normal at 3.7, sodium 142, BUN 20, creatinine 1.2, calcium 8.5. Diet was advanced from clear liquid to full liquid diet and Protonix changed to p.o., IV fluids were decreased to 50 cc per hour. The patient continued to improve over the course of 48 hours and by the day of discharge had been advanced to a regular diet and had no further nausea, vomiting, or diarrhea. It was felt that she probably had acute gastroenteritis which was self-limiting. Her urine became more clear as she was rehydrated. It was felt her chronic abdominal pain, anxiety, and depression were stable. Urinary culture returned with greater than 100,000 colony-forming units of mixed flora felt to be contamination. Dr. Lovell Sheehan saw the patient with regards to a suture knot removal from her old midline surgical wound. This was removed  without any complications. Again, by day three of hospitalization she was tolerating regular diet and was ready for discharge. On day of discharge, her sodium was 141, potassium 3.8, BUN 12, potassium 1.2, glucose 89. Hemoglobin 10.2, hematocrit 30.9 (down  from admission, however this was secondary to rehydration). She normally runs in the 10 range.  DISCHARGE CONDITION:  Stable.  DISPOSITION:  Discharged to home.  DISCHARGE MEDICATIONS:  She will resume home medications to include: domperidone 10 mg p.o. b.i.d.; Prilosec 40 mg q.a.m.; Bactrim DS q.d.; doxepin 150 mg q.h.s., a prescription given for #31 with 3 refills; Vicodin 5/500 mg one p.o. q.i.d. p.r.n. pain, a prescription given for #30 with 0 refills; Xanax 0.5 mg p.o. q.i.d., a prescription given for #123 with free refills.  DISCHARGE INSTRUCTIONS:  She was asked to notify Dr. Karilyn Cota if she developed recurrent nausea, vomiting, diarrhea, or orthostatic symptoms.  FOLLOWUP:  She is supposed to follow up with Dr. Karilyn Cota as already scheduled for later this month.  PHYSICAL EXAMINATION:  VITAL SIGNS:  A physical examination prior to discharge revealed a temp of 97.1, pulse 94, respirations 18, blood pressure 116/72. 24-hour Intake: 3139. 24-hour Output: 350+ (not all urine was measured).  CHEST:  Clear to auscultation.  CARDIAC:  Regular rate and rhythm, normal S1 & S2, no murmurs, rubs, or gallops.  ABDOMEN:  Positive bowel sounds, flat with extensive scarring, ileostomy bag in the right lower quadrant has clear urine. Palpation reveals soft abdomen with tenderness in the left lower quadrant which is chronic for her. No organomegaly or masses. No rebound, tenderness, or guarding. Dictated by:   Tana Coast, P.A. Attending Physician:  Malissa Hippo DD:  11/04/01 TD:  11/05/01 Job: [id number]

## 2010-11-10 NOTE — Discharge Summary (Signed)
[facility]  Patient:    [name], [name] Visit Number: [id number] MRN: [id number]          Service Type: MED Location: 3A A310 01 Attending Physician:  Malissa Hippo Dictated by:   Lionel December, M.D. Admit Date:  09/08/2001 Discharge Date: 09/11/2001                             Discharge Summary  DISCHARGE DIAGNOSES: 1. Dehydration secondary to next. 2. Polyuria due to renal failure. 3. Chronic reflux esophagitis. 4. Cholelithiasis. 5. Acute bronchitis. 6. Chronic abdominal pain. 7. Chronic constipation. 8. Depression and anxiety neurosis. 9. Insomnia.  CONDITION ON DISCHARGE:  Improved.  CONSULTATIONS:  Nephrology consultation with Dr. Kristian Covey.  DISCHARGE MEDICATIONS: 1. Xanax 0.5 mg q.i.d. 2. Prilosec 40 mg q.a.m. 3. Vicodin 1 q.i.d. p.r.n. 4. MiraLax 17 g q.d. 5. Caltrate 2 tablets p.o. q.d. 6. Doxepin 75-150 mg q.h.s. 7. Domperidone 10 mg before each meal. 8. Bactrim DS q.d.  HISTORY OF PRESENT ILLNESS:  The patient is a 70 year old Caucasian female with multiple medical problems who was recently hospitalized with nausea, vomiting, and dehydration felt to be due to acute gastroenteritis.  She improved with therapy.  She returned for an office visit when she was complaining of profound weakness, postural lightheadedness, and also had productive cough and infrascapular pain.  She was noted to be orthostatic. She was, therefore, brought over to the hospital.  She also brought in a record of her urine output and noted she was having four liters of urine every day, and I was concerned that she was not able to keep up with her fluid intake.  ADMISSION LABORATORY DATA:  WBC of 11.8, H&H of 12.4 and 35.9, platelet count of 380,000.  She had 77% neutrophils, but no bands.  Her sodium was 136, potassium was 5.4, chloride 106, CO2 was 22, glucose was 101, BUN was 84, creatinine was 2.4.  These were much higher than her usual baseline.   Calcium was 9.3, total protein 9.8, with albumin of 3.4.  HOSPITAL COURSE:  She was begun on IV fluids.  Urinalysis, urine electrolytes, osmolality, as well as serum and urine osmolality were requested.  Her chest x-ray revealed mild bronchitic changes and interstitial changes with resolution of her questionable infiltrate left upper lobe.  Renal ultrasound was also obtained and showed evidence of cystectomy and right nephrectomy. She had a small area of fluid in the upper pole left kidney centrally felt to be possibly a distended upper pole calix.  There was no evidence of hydronephrosis.  She was seen in consultation by Dr. Kristian Covey.  He felt that her renal insufficiency was primarily prerenal, but he was also concerned about interstitial nephritis given she has had multiple episodes of UTI in the past.  The patients BUN and creatinine dropped significantly posthydration. She was also treated with IV antibiotics, and her cough improved.  She was maintained on her usual medications.  Her serum osmolality was 316, and urine was 340.  Urinary sodium was 73, and potassium was 2.8.  Posthydration her BUN had dropped to 27, and creatinine was 1.3.  Her urinalysis was pertinent for specific gravity of 1.010, pH of 6.5, and positive nitrites.  She had too many white cells and many bacteria.  Culture revealed greater than 100,000 colonies of enterococcus.  It was sensitive to Levaquin, ______ , nitrofurantoin, and vancomycin.  I felt that this could have been  a contamination given that she has an ileal conduit.  The patient maintained a good appetite while in the hospital.  She was discharged with medications as above.  She was asked to keep a written record of urine output, and she was asked to drink 1-1/2 times the amount of urine out the day before.  She was scheduled to be seen with Dr. Kristian Covey on September 09, 2001. Dictated by:   Lionel December, M.D. Attending Physician:  Malissa Hippo DD:  09/26/01 TD:  09/27/01 Job: [id number]

## 2010-11-10 NOTE — Consult Note (Signed)
[name], [name] NO.:  [id number]   MEDICAL RECORD NO.:  [id number]          PATIENT TYPE:  EMS   LOCATION:  ED                            FACILITY:  [facility]   PHYSICIAN:  Osvaldo Shipper, MD     DATE OF BIRTH:  July 26, 1940   DATE OF CONSULTATION:  DATE OF DISCHARGE:                                   CONSULTATION   REASON FOR CONSULTATION:  Failure to thrive and weight loss.   CHIEF COMPLAINT:  Weakness.   HISTORY OF PRESENT ILLNESS:  The patient is a 70 year old Caucasian female  who has a long complicated medical history, most of which is chronic, who  presents with complaints of feeling unwell, is complaining of weight loss,  complaining of nausea, and one episode of falling.  She says her pain in the  abdomen is in the middle part of the abdomen and is usually chronic in  nature.  She says there are times when she has worsening of her pain and  then she has to take pain medication with which she has control.  She says  she has had this abdominal pain ever since 1973.  She also has nausea  chronically and had one episode of emesis about two days ago and none today.  She had one episode of fall last Friday when she tripped while she was  cleaning her porch.  She did not have any syncopal episode and did not hit  her head.  She also says that she has been having poor p.o. intake, however,  has been eating reasonably over the past few days, as well.  She also gives  a history of unintentional weight loss of about 15 pounds over the past one  month.  She came in today because of increase in her weakness and difficulty  in ambulation.   Currently, she has spent about eight hours in the emergency department.  She  has been on IV fluids.  She says she feels quite well at this time.  She has  been ambulating to the bathroom and has not experienced any dizziness or  other such symptoms.  She denies any fever at home.  She says that she has a  chronic urine infection  most of the time.  She states she has an appointment  to see Dr. Karilyn Cota on September 22 and he also requested blood work on her on  September 10 prior to her appointment date.  She has had an EGD and  colonoscopy done within the last ten years, she does not know the results of  those, and does not know the exact date.   MEDICATIONS AT HOME:  1. Macrodantin 15 mg once a day.  2. Actigall weekly.  3. Glycolax 17 grams once daily.  4. Alprazolam 0.5 mg four times daily.  5. Doxepin 150 mg at bedtime.  6. Hydrocodone/acetaminophen 5/500 as needed.  7. Calcium twice a day.  8. Atenolol 25 mg once a day.  9. Zegerid 40 mg once a day.  10.Tums as needed.  11.Multi-vitamins one tablet daily.  12.Aspirin 81 mg  once a day.   ALLERGIES:  Morphine.   PAST MEDICAL HISTORY:  Very complicated history.  Current medical problems  include acid reflux disease, osteoporosis, arthritis, questionable history  of what she says is irregular heart beat, chronic UTIs.  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  She has had a radical cystectomy  with ileal conduit in 1974.  She has had a cholecystectomy.  She has had  extensive lysis of adhesions in the past.  She also has a history of mild  renal insufficiency, creatinine usually around 1.8, last reading was 2.5  back on July 30.  Recurrent UTIs.  Chronic GERD.  Chronic abdominal pain.  History of depression and anxiety, as well.  The patient has experienced a  blood clot in her right leg in the past.   SOCIAL HISTORY:  She lives with her husband in [place].  No history of smoking.  No history of alcohol use.  No illicit drug use.  She is dependent with her  ADLs and walks about 1 1/2 miles everyday but has been unable to do that  over the past few days.   FAMILY HISTORY:  There is a history of diabetes in the family, history of  stomach cancer in her father and  he passed away because of the cancer.  History of blood clots in her mother, brother, and possibly in her daughter,  as well.   REVIEW OF SYSTEMS:  Ten point review of systems was done which was  remarkable for the weight loss, weakness, chronic UTIs, otherwise,  unremarkable.   PHYSICAL EXAMINATION:  VITAL SIGNS:  Afebrile, temperature 98.1, weight not recorded in the ED,  blood pressure 122/62 on last reading, heart rate 81, respiratory rate 14,  saturation 99% on room air.  GENERAL:  This is a thin female in no distress.  HEENT:  There is no pallor, no icterus, oral mucous membranes are moist, no  oral lesions are noted.  NECK:  Soft and supple.  No thyromegaly is appreciated.  LUNGS:  Clear to auscultation bilaterally.  HEART:  Normal S1 and S2, regular, no murmurs appreciated.  ABDOMEN:  Multiple scars from previous surgeries are noted.  There is  tenderness diffusely but no rebound or rigidity.  Bowel sounds are present.  There is an ileal conduit noted draining clear urine.  She mentioned her  last BM was yesterday which was normal.  EXTREMITIES:  Without edema, peripheral pulses are palpable.  NEUROLOGICAL:  The patient is alert and oriented x3, no focal neurological  deficits are appreciated.  Gait within normal limits.   LABORATORY DATA:  White count 6.8, hemoglobin 13.3, platelet count 385.  CMET shows BUN of 34, which was 38 on July 30, creatinine 2.3, which was 2.5  on July 30.  AST 54.  All other parameters are normal.  Lipase was normal.  UA shows small blood, trace protein, positive nitrites, negative leukocytes,  many bacteria, 7-10 WBCs.  As mentioned before, she has chronic UTI.   IMAGING STUDIES:  She had an x-ray of her right shoulder which was normal  and she had an abdominal series which, also, was unremarkable.   IMPRESSION:  This is a 70 year old Caucasian female who has multiple medical problems including chronic abdominal pain and other medical issues  as  discussed earlier who presents with weight loss, weakness, and mild  dehydration.  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 Karilyn Cota for which she has been referred to a nephrologist in the past.  The patient was mildly dehydrated when she presented to the ED.  She has  received more than a liter of IV fluids.  She is now ambulating.  She is  also able to keep down p.o. fluids at this time.  Her weight loss is  concerning and will require further follow up.   PLAN:  Considering her improvement in her symptoms, I think it is not really  necessary at this time to admit this patient.  She does not appear to need  inpatient medical care at this time.  I have discussed this with the patient  as well as with her PMD, Dr. Karilyn Cota, and both agree on this point.  Her  creatinine is elevated but it is better than before.  She will need close  follow up with her PMD to make sure that her creatinine comes down to her  baseline.  If it does not, she will need to be sent, again, to a  nephrologist.  The patient has been told that if she has recurrence of her  symptoms or if she feels worse, she needs to return to the ED immediately.  She is also being prescribed some Phenergan for her nausea which means she  may take as needed.  She has been told that her weight loss is concerning  and that she will require evaluation for the same.   The patient may need to have her TSH checked.  She may need to have another  CAT scan of her abdomen and pelvis done, as well.  I am not sure when she  had her recant endoscopies, but these also need to be reconsidered at this  time.  Her positive UA appears to be chronic, she is afebrile.  She usually  has similar UA picture in the past.  I have asked her to continue her  Macrodantin for now until she sees Dr. Karilyn Cota next week.  Otherwise, I have  told her to continue her other medications as before.  I have told her to  call Dr. Patty Sermons  office tomorrow to reschedule an appointment for next  week.  I discussed this with Dr. Dionicia Abler, as well, and he said he would see  her next week in his office.  She is making an adequate amount of urine and  I do not suspect there is any obstruction at this time.  Review of her  previous imaging studies did suggest mild  hydronephrosis back in 2005.   The patient is in agreement with the above plan.  At this time, we will  discharge her home and instruct her as above.      Osvaldo Shipper, MD  Electronically Signed     GK/MEDQ  D:  02/28/2006  T:  02/28/2006  Job:  [id number]   cc:   Lionel December, M.D.  [address]  [place]  [place] [address]

## 2010-11-10 NOTE — Discharge Summary (Signed)
[name], BEEVER              ACCOUNT NO.:  [id number]   MEDICAL RECORD NO.:  [id number]          PATIENT TYPE:  INP   LOCATION:  A332                          FACILITY:  [facility]   PHYSICIAN:  Margaretmary Dys, M.D.DATE OF BIRTH:  1941/04/07   DATE OF ADMISSION:  03/05/2006  DATE OF DISCHARGE:  09/15/2007LH                                 DISCHARGE SUMMARY   PRIMARY CARE PHYSICIAN:  Lionel December, M.D.   DISCHARGE DIAGNOSES:  1. Acute abdominal pain.  2. Acute pyelonephritis.  3. Failure to thrive.  4. Chronic pain syndromes.  5. Hypokalemia.  6. Osteoporosis.  7. Insomnia.  8. Gastroesophageal reflux disease.   DISCHARGE MEDICATIONS:  1. Cipro 250 mg p.o. q.8 hours for 3 days only.  2. Macrodantin 50 mg p.o. once a day.  3. Actonel 30 mg p.o. once a week.  4. MiraLax 17 grams p.o. once a day.  5. Xanax 0.05 mg p.o. q.i.d.  6. Doxepin 150 mg p.o. q.h.s.  7. Vicodin 5/500 one p.o. q.i.d. p.r.n.  8. Calcium 600 mg p.o. b.i.d.  9. Atenolol 25 mg p.o. once a day.  10.Zegerid 40 mg p.o. once a day.  11.TUMS p.r.n.  12.Prilosec once a day.  13.Multivitamin once a day.  14.Aspirin 81 mg once a day.  15.Phenergan 25 mg p.o. once a day.   FOLLOWUP:  1. With Dr. Karilyn Cota in about 1-2 weeks.  She will also have a metabolic-7      done.  2. With [facility] for follow-up appointment.  The patient      may need a stent.  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   CONSULTATIONS:  1. Kassie Mends, M.D., gastroenterology.  2. Jorja Loa, M.D. due chronic failure with acute worsening.  3. Dennie Maizes, M.D., urology, due to hydronephrosis who recommended      the patient return to [facility] for further evaluation and care.   HOSPITAL COURSE:  [name] is a 70 year old Caucasian female with  multiple medical problems who presented to the emergency room with  complaints of nausea, vomiting, and  generalized weakness.  In the emergency  room, she was found to have a mildly elevated creatinine from baseline of  1.5 to 2.3.  She has had some chronic abdominal pain for weeks.  One day  prior to admission she had come to Dr. Patty Sermons office where blood work  showed low potassium.  She was subsequently advised to come into the  emergency room.  Her husband believed that she has not been doing well and  is heading downhill.  During the course of hospitalization, we obtained  consultations as mentioned above.  We found out she basically needed to be  hydrated as her renal insufficiency was from prerenal azotemia.  The  urologist agreed with this assessment and with the IV fluids.  She was also  found to have a urinary tract infection for which she was treated with  Cipro.  She does have chronic urinary tract infection.  More importantly was  that the  patient will need to follow up with [facility] for definitive evaluation and further therapeutic options available  to her.  It appears that some of the recurrent urinary tract infections  makes her fairly sick.  She takes chronic Macrodantin for that.   She was fairly stable throughout the course of admission and was never  septic.  Pain was fairly controlled.  Dr. Karilyn Cota saw her and felt that the  patient would need some kind of EGD down the line.  The patient was doing  fairly well and on the day of discharge, the creatinine was slightly up to  2.2, making some improvement and she was advised to increase the amount of  fluids she drank.  Basic metabolic-7 will be obtained in 4 days.   The patient was subsequently discharged home in satisfactory condition.   DISPOSITION:  To home.  Discussed with Dr. Karilyn Cota on the day of discharge.      Margaretmary Dys, M.D.  Electronically Signed     AM/MEDQ  D:  03/14/2006  T:  03/15/2006  Job:  [id number]

## 2010-11-10 NOTE — H&P (Signed)
NAMESTAMATIA, MASRI              ACCOUNT NO.:  [id number]   MEDICAL RECORD NO.:  [id number]          PATIENT TYPE:  INP   LOCATION:  A302                          FACILITY:  [facility]   PHYSICIAN:  Lionel December, M.D.    DATE OF BIRTH:  Jan 28, 1941   DATE OF ADMISSION:  10/15/2004  DATE OF DISCHARGE:  LH                                HISTORY & PHYSICAL   PRESENTING COMPLAINT:  Weakness, postural lightheadedness, abdominal pain,  and nausea, vomiting.   HISTORY OF PRESENT ILLNESS:  Erna is a 70 year old Caucasian female with  multiple chronic problems who is well known to me from previous evaluation,  who presented to the emergency room with 5-6 day history of nausea,  vomiting, abdominal pain, and constipation.  However, she did not vomit  today or yesterday.  She just felt dizzy ever time she would stand up, and  she felt like she would pass out.  She has been vomiting primarily bile but  no blood.  She also has not had tarry stools.  Her last bowel movement was  over two days ago and was greenish and liquid.  She generally has 1-2 bowel  movements per day.  She states lately she has been constipated and complains  of pain on the left side.  She states until these symptoms started, she was  eating well, although she has been attending to her husband who had back  surgery two weeks ago.  She denies hematuria, fever, or chills.  She is  complaining of pain involving the right side of the face and shoulder as  well as low back.  Two weeks ago she fell in her home while coming down  stairs.  She was seen in the emergency room two weeks ago.  She had x-rays  at [facility].  She had x-ray of her face possibly shoulder and these were  negative.  She was discharged on diclofenac which she has taken for nine  days.  She did not ask any questions, was not aware that this is an NSAID.  She states she tries to keep up with fluid intake.  She has known polyuria.  She was seen by Dr. Caryn Section a few weeks ago.   He was wondering if she has  nephrogenic __________  .  Tests were planned but had to be postponed  because of her husband's surgery.  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  Her heartburn  is fairly well controlled with Prilosec and Tums.  She has lost 10 pounds in  the last month or so.   She is on:  1.  Vicodin 5/500 q.i.d.  2.  Xanax 0.5 mg q.i.d.  3.  Prilosec OTC 20 mg every day.  4.  Tums p.r.n.  5.  Doxepin 150 mg q.h.s.  6.  MiraLax 17 grams every day.  7.  MDI p.r.n. every day.  8.  Macrodantin 50 mg every day.  9.  Actonel 35 mg every week.   PAST MEDICAL HISTORY:  1.  She has chronic GERD. She has had an EGD in the past few years.  2.  She has chronic abdominal pain.  She has had multiple surgeries.  3.  She had a right nephrectomy in 1973, and she had a radial cystectomy      with ileal conduit a year later.  4.  Her last abdominal surgery was 3-4 years ago by Dr. Dicky Doe of      [facility] when she had cholecystectomy and lysis of extensive adhesions in      her lower abdomen.  She had a segment of her small bowel resected.  5.  She has insomnia, anxiety, and a history of depression.  6.  Raynaud phenomenon since she quit smoking several years ago she is      having these episodes much less frequently.  7.  Mild renal failure.  Her creatinine generally runs between 1.5 and 1.8      to 20.  On a few occasions when she has been admitted with dehydration,      her creatinine has been over 2 and on one occasion it was 4, but it      corrected with hydration.  8.  She has recurrent urinary infection.  In November 2005, she was admitted      to the hospital with more or less similar presentation.  At that time      she had Klebsiella UTI and was treated with Cipro.  She has been on      chronic antibiotic therapy for suppression.  Ultrasound, in November      2005, revealed mild left hydronephrosis which  was unchanged from a      previous study.   ALLERGIES:  None known.   FAMILY HISTORY:  Noncontributory.   SOCIAL HISTORY:  She is married.  She is disabled.  She smoked for several  years but quit 11 years ago, and she does not drink alcohol.  She has three  children, both her daughters are doing fine.  Her son has had problems with  drug abuse but presently doing quite well.   PHYSICAL EXAMINATION:  GENERAL:  A pleasant, well developed, thin, Caucasian  female who is in no acute distress.  VITAL SIGNS:  Admission weight 122.2 pounds.  She is 65.5 inches tall.  Pulse 82 per minute, blood pressure 122/69.  Her respiratory rate is 20 and  temp is 98.  Her orthostatics in the emergency room supine pulse 77 blood  pressure 111/60, sitting pulse 81 blood pressure 110/60, standing pulse 86  and blood pressure 118/63.  Apparently she had some fluid when this was  done.  HEENT:  She has a bruise over her right face.  Conjunctivae pink.  Sclerae  nonicteric.  Oropharyngeal mucosa is normal.  NECK:  No neck masses are noted.  CARDIAC:  Regular rhythm.  Normal S1, S2.  No murmur or gallop noted.  LUNGS:  Clear to auscultation.  ABDOMEN:  Full.  Bowel sounds are normal.  She has tenderness at LLQ and LUQ  which is a chronic finding.  GENITOURINARY:  She has clear urine with some fine particulate matter in the  bag.  She has had an ileal conduit.  RECTAL:  Reveals distended rectum but there is no __________  .  Digital  exam reveals no impaction.  Stool is guaiac negative.  Rectum, however, was  distended.  She has a small bruise over the posterior aspect of her right  shoulder and right coccygeal area.   LABS:  WBC 6.3, H&H is 12.1 and 35.0, platelet count is 323 K.  Sodium 128,  potassium 4.3, chloride 98, CO2 is 29, glucose 103, BUN 40, creatinine 1.9.  Calcium is 8.4, total protein 6.1, with albumin of 3.0, bilirubin is 0.6, AP 72, AST 15, ALT 14.  Acute abdominal series revealed no  abnormality.  Chest  x-ray is unremarkable.  She does not have air/fluid levels.  She has a lot  of stool in her colon.   ASSESSMENT:  Shayne is a 70 year old Caucasian female who presents with a two-  day history of nausea, vomiting and postural symptoms.  She has elevated BUN  and creatinine.  She also has lost 10 pounds.  Clinically, she appears to be  dehydrated.  She is also constipated.  She is having more left sided abdominal pain which  I feel is secondary to constipation.  She has had a sigmoidoscopy and a  barium enema within the past few years and more recently she had a  Gastrografin enema within the last six months without any stricture or  abnormalities.  She could have had a viral syndrome, given that she has 14%  monocytes, but her segs are normal.   PLAN:  1.  We will obtain a urine sample from ileal conduit for analysis and      culture.  2.  We will start her on her usual meds but hold off her Actonel as a dose      is not due until three days from now.  3.  We will leave her on clear liquids.  4.  We will give her D-5 normal saline at a rate of 150 ml/hr.  5.  MET-7 will be repeated in the a.m.  6.  Need for further evaluation or abdominal CT will be determined after the      patient is seen in the a.m.  7.  We will also use Lidoderm for shoulder pain.  8.  MiraLax dose will be increased to 17 grams b.i.d. and she will be given      one or two Fleet's enemas this evening.      NR/MEDQ  D:  10/15/2004  T:  10/15/2004  Job:  [id number]

## 2010-11-10 NOTE — Discharge Summary (Signed)
NAME:  [name], [name]                        ACCOUNT NO.:  [id number]   MEDICAL RECORD NO.:  [id number]                   PATIENT TYPE:  INP   LOCATION:  A320                                 FACILITY:  [facility]   PHYSICIAN:  R. Roetta Sessions, M.D.              DATE OF BIRTH:  July 27, 1940   DATE OF ADMISSION:  04/11/2003  DATE OF DISCHARGE:  04/17/2003                                 DISCHARGE SUMMARY   DISCHARGE DIAGNOSES:  1. Serratia marcescens and Klebsiella pneumonia urinary tract infection with     secondary volume contraction/weakness much improved.  2. Hyponatremia and hypokalemia secondary to #1 superimposed on chronic     renal disease resolved.  3. Worsening of chronic anemia etiology uncertain.  4. Exacerbation of chronic constipation, abdominal and flank pain improved.  5. Status post distant right nephrectomy with radical cystectomy status post     abdominal surgery for adhesions small bowel status post cholecystectomy.  6. History of depression, insomnia, gastroesophageal reflux disease.   DISCHARGE DIET:  As tolerated.  The patient was admonished to take in at a  minimum 16 ounces of water over and above her usual fluid intake - this was  emphasized.   ACTIVITY:  As tolerated.   DISCHARGE MEDICATIONS:  1. Cipro 250 mg tablets twice daily times at least 3 weeks until seen by Dr.     Rito Ehrlich on May 04, 2003.  2. Doxepin 150 mg at bedtime.  3. Nexium 40 mg orally twice daily.  4. Vicodin 5/500 tablets four times daily p.r.n. pain.  5. Xanax 0.5 mg four times daily.  6. MiraLax 17 g daily as needed for constipation.  7. Centrum daily.   DISCHARGE FOLLOWUP:  1. Dr. Rito Ehrlich - May 04, 2003 at 11:15 a.m.  2. Dr. Karilyn Cota - The patient is to call April 19, 2003 for an appointment     in the next 2-3 weeks.   HOSPITAL COURSE:  [name] is a 70 year old lady with a history of  multiple urinary tract infections, a history of right nephrectomy with  radical cystectomy and ileal conduit who developed high fever and weakness  recently for which further evaluation revealed evidence of urinary tract  infection with positive nitrites and leukocyte esterase, she had pyuria on  urinalysis, she became profoundly volume contracted with secondary  hypokalemia and hyponatremia, she was admitted to the hospital for further  management on April 11, 2003.  Urine cultures, as stated above, revealed  Serratia marcescens/Klebsiella pneumonia in the urine but sensitive to  Cipro.  She was  started on IV Cipro; this was subsequently converted over to the oral route.  She defervesced fairly rapidly over her hospitalization.  She was not having  any urinary tract symptoms, although she was having some flank pain which  was managed with morphine and Lortab.  Her hyponatremia corrected with IV  fluids and her hypokalemia corrected with potassium  supplementation.  Her  leukocytosis resolved as well.  She was admitted with a white count of 16.9.  On April 15, 2003 her white count came down to 8.4.  She was noted to have  somewhat worsening of chronic anemia.  Initial H&H were 11.7 and 34.5, MCV  91.6.  On April 15, 2003 hemoglobin was 10 and 30.1.  She had no evidence  of bleeding anywhere.   It is notable this lady has been on longstanding suppressive antibiotic  therapy in the way of Bactrim and her suppressive therapy was changed to  Macrodantin a few weeks prior to this acute infection.  It is also notable  that the two microbes in her urine were both sensitive to Bactrim but  resistant to Macrodantin.   On the day of discharge the patient was afebrile, was taking p.o., including  fluids, very well and was essentially back to her baseline.  For the record  on her admission April 11, 2003 her sodium was 127, potassium 2.5,  chloride 100, CO2 20, glucose 118, BUN 42, creatinine 1.7, calcium 8.7.  On  the day of discharge sodium 135, potassium 4.9,  chloride 85, CO2 25, glucose  106, BUN 28, creatinine 1.4, calcium 9.1.   The patient was discharged on April 17, 2003 in stable and much improved  condition.  The importance of adequate fluid intake was emphasized to both  [name] and her husband.     ___________________________________________                                         Jonathon Bellows, M.D.   RMR/MEDQ  D:  04/17/2003  T:  04/17/2003  Job:  [id number]   cc:   Lionel December, M.D.  [address]  [place]  [place] [address]  Fax: [phone]   Dr. Rito Ehrlich

## 2010-11-10 NOTE — H&P (Signed)
NAME:  Melody, [name]                        ACCOUNT NO.:  [id number]   MEDICAL RECORD NO.:  [id number]                   PATIENT TYPE:  INP   LOCATION:  A325                                 FACILITY:  [facility]   PHYSICIAN:  Lionel December, M.D.                 DATE OF BIRTH:  02/12/1941   DATE OF ADMISSION:  04/11/2003  DATE OF DISCHARGE:                                HISTORY & PHYSICAL   PRESENTING COMPLAINT:  Fever and profound weakness.   HISTORY OF PRESENT ILLNESS:  [name] is a 70 year old Caucasian female who was  in usual state of health when she went to bed last night.  She was feeling  well enough that she actually worked in her garden.  She woke up at 2 a.m.  this morning with fever and weakness.  Her fever continued.  Around 11 a.m.,  she had a temperature of 105.  She felt very weak, nauseated and loss of  appetite.  She also has noted soreness in her left flank and left lower  quadrant of her abdomen.  She did not experience diarrhea, melena or rectal  bleeding, cough, shortness of breath or chest pain.  She has had headache  and rigors.  She has not experienced any skin rash or joint pains.  There is  no history of recent travel or insect bites.  The patient's husband called  me around 3 p.m. and I suggested that the patient be brought to the  emergency room immediately.  The patient was evaluated by Dr. Colon Branch in the  emergency room.  She has leukocytosis.  Chest x-ray is negative for  pneumonia, but she does have nitrates and leukocytes in her urine and  therefore, suspected to have urosepsis which she has history of.  She states  that she drank a lot of water during the night and she believes her urine  output in the last 24 hours was over a gallon.  However, she has not been  able to drink much during the daytime.   MEDICATIONS:  1. Doxepin 150 mg q.h.s.  2. Macrodantin, ?dose daily.  3. Nexium 40 mg b.i.d.  4. Vicodin 5/500 q.i.d.  5. Xanax 0.5 mg q.i.d.  6.  MiraLax 17 grams daily.  7. Centrum daily.   PAST MEDICAL HISTORY:  She has a complicated history.  She has chronic  abdominal pain.  She has had multiple abdominal surgeries, the last one of  which was in March, 2003, when she had a cholecystectomy, lysis of adhesions  and resection of a segment of her small bowel.  This was [facility] at [facility].  She has had right nephrectomy and radical cystectomy over 20  years ago and she has ileal conduit.  She has Raynaud's phenomenon.  She has  chronic depression, insomnia, anxiety and chronic GERD.  Her GERD symptoms  have not been well controlled.  She was recently  evaluated by Dr. [name],  Pulmonologist in [place], at my request for a chronic cough.  Her cough was  felt to be multifactorial.  She has been using fibrillator.  She has noted  significant improvement.  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  Previous  attempts at getting her off narcotic therapy have not been successful.  She  [facility] in [place], [place].  She has  chronic constipation.  She has chronic renal failure.  Her creatinine  generally runs between 1.6 and 1.8.  However, she has impaired renal  concentration and she generally passes large volume urine every day.  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  A renal  ultrasound a few months ago suggested mild hydronephrosis.  She was  evaluated by Dennie Maizes, M.D.  She had a loopogram which was negative.  He did not feel that she had obstructive uropathy.   ALLERGIES:  Not known.   FAMILY HISTORY:  Noncontributory.  She lost one brother a few months ago of  lung carcinoma.  He was 28 years old.  One brother died of myocardial  infarcts.   SOCIAL HISTORY:  She is married.  She has three children.  She has been  disabled for years.  She smoked cigarettes for 12 years, but quit  over 10 or  11 years ago.  She does not drink alcohol.  She and husband live in [place],  [place].   PHYSICAL EXAMINATION:  GENERAL:  Well-developed, thin, Caucasian female who  does not appear to be in acute distress.  Admission weight 128.3 pounds.  She is 5 feet, 5.5 inches tall.  VITAL SIGNS:  Pulse 101/min., blood pressure 106/83, temperature 100.6,  respiratory rate 18.  Her temperature in the emergency room was 103.3.  HEENT:  Conjunctivae is pink, sclerae is nonicteric.  Oropharyngeal mucosa  is normal.  NECK:  Supple, JVD is flat, no thyromegaly or lymphadenopathy.  CARDIAC:  Regular rhythm, normal S1 and S2.  No murmur or gallop noted.  LUNGS:  Clear to auscultation.  ABDOMEN:  Flat.  She has clear urine in bag.  Ileal conduit is in right  lower quadrant.  Bowel sounds are normal.  Abdomen is soft.  She has mild  tenderness at left lower quadrant of abdomen.  She has extensive scarring.  This is a more or less chronic finding.  She is also tender at left renal  angle.  No organomegaly or masses noted.  RECTAL:  Deferred.  EXTREMITIES:  She does not have skin rash, joint swelling, peripheral edema  or clubbing.  NEUROLOGIC:  She is awake and alert and she does not have Kernig's sign.   LABORATORY DATA:  From admission, WBC 16.9, H&H 11.7 and 34.5, platelet  count 314K.  Urine has a specific gravity of 1.010, pH 6.5, protein is  negative, nitrite positive, leukocyte esterase is also positive, 11-20 WBC's  per high power field, 0-2 RBC's and has many bacteria.  Please note, this  specimen was taken from the bag.  Sodium 127, potassium 2.5, chloride 100,  CO2 20, glucose 118, BUN 42, creatinine 1.7, calcium 8.7.  Chest x-ray  reportedly is negative for pneumonia.   ASSESSMENT:  [name] is a 70 year old Caucasian female with multiple medical  problems, who presents with acute illness, consisting of fever, rigors, left- sided abdominal pain, profound weakness.  She  has  hypernatremia and  hypokalemia.  I feel her electrolyte imbalance is due to renal losses.  I  suspect she has urosepsis.  She has headache which is felt to be due to  fever and sepsis, however, she does not have neck stiffness or Kernig's  sign.  She will be monitored very closely.   PLAN:  Urine and blood cultures have been obtained.  Will start her on Cipro  400 mg IV q.12h. and give her D5 normal saline with KCl supplement to  correct her electrolyte abnormalities.  Will also give her KCl p.o.  Will  continue her usual medications and give her MS x2 for headache.  She will  have CBC, MET-7 and serum magnesium in a.m.       ___________________________________________                                         Lionel December, M.D.   NR/MEDQ  D:  04/11/2003  T:  04/12/2003  Job:  [id number]

## 2010-11-10 NOTE — H&P (Signed)
[facility]  Patient:    [name], [name] Visit Number: [id number] MRN: [id number]          Service Type: MED Location: 3A A319 01 Attending Physician:  Cassell Smiles. Dictated by:   Lionel December, M.D. Admit Date:  11/02/2001                           History and Physical  PRESENTING COMPLAINT:  Weakness, postural symptoms.  Nausea, vomiting and diarrhea of three days duration.  HISTORY OF PRESENT ILLNESS:  The patient is a 70 year old Caucasian female with multiple medical problems reviewed under past medical history, who was in usual state of health until three days ago when she developed nausea, vomiting and nonbloody diarrhea.  She has not been able to keep her medications.  She also has been feeling very dizzy and light-headed when she would stand up and felt like she were going to pass out and her heart was racing.  She called me at 7:15 this morning and was asked to come to the emergency room and subsequently hospitalized.  She has had loose watery stools.  She denies hematemesis, melena or rectal bleeding.  Pain is mainly in the lower abdomen on left lower quadrant.  She states since her abdominal surgery about seven or eight weeks ago at [facility], she has had less pain and she is also cutting back on her pain medication.  She denies fever or chills.  She also complains of leg pain as well as left flank pain, but she has had flank pain chronically.  The patient tells me that she has lost 8 pounds since yesterday.  She also has had problems with polyuria but she states her urine output has decreased over the last two months.  She tries to drink plenty of fluids every day.  She is also complaining of heartburn but denies chest pain or dyspnea.  When seen in the office about three or four weeks ago, she weighed 128 pounds and now her weight is 114.6.  About four months ago, she was 130 to 135 pounds.  CURRENT MEDICATIONS: 1. Xanax 0.5 mg  q.i.d. 2. Vicodin 5/500 q.i.d. p.r.n. 3. Doxepin 150 mg q.h.s. 4. Prilosec 40 mg q.a.m. 5. Domperidone 10 mg b.i.d. 6. Bactrim DS q.d.  She also was on Miralax and atenolol but she is not taking either of these medications on a regular basis.  PAST MEDICAL HISTORY:  She has chronic reflux esophagitis, chronic abdominal pain, anxiety neurosis, depression and insomnia.  She has a history of interstitial cystitis for which she finally underwent a radical cystectomy, at which time she also had right nephrectomy and ileal conduit formation.  She has had multiple bouts of UTI and has been on chronic suppression with Bactrim DS and/or Cipro.  She also has renal failure felt to be perhaps due to recurrent bouts of infection.  Earlier this year, she did have renal ultrasound which ruled out hydronephrosis.  Her renal failure has been primarily large-volume output but this has decreased spontaneously.  She had cholecystectomy and lysis of adhesions at [facility] in March of this year.  At that time, she was transferred to [facility] following her third admission.  I have yet to see those records.  Other surgeries include hysterectomy and C-section.  Years ago, she has pneumothorax following a central line placement.  ALLERGIES:  None known.  FAMILY HISTORY:  Noncontributory.  She has three children.  SOCIAL  HISTORY:  She is married and disabled.  She has not smoked in over seven years and she does not drink alcohol.  PHYSICAL EXAMINATION:  GENERAL:  A pleasant, well-developed, thin Caucasian female who is in no acute distress.  She weighs 114.6 pounds.  She is 5-feet 5-1/2-inches tall.  VITAL SIGNS:  Supine, pulse was 112 per minute, blood pressure was 101/80, respiratory rate was 20, temperature was 97; standing, her pulse was 150 and blood pressure did not change much.  HEENT:  Conjunctivae are pink.  Sclerae nonicteric.  Oropharyngeal mucosa is somewhat dry.  NECK:  Supple.  JVD is flat.  No  thyromegaly or lymphadenopathy.  CARDIAC:  Regular rhythm.  Normal S1 and S2.  No murmur or gallop noted.  LUNGS:  Clear to auscultation.  ABDOMEN:  Her abdomen is flat with extensive scarring.  Ileostomy bag in the right lower quadrant has clear urine.  Bowel sounds are present.  Palpation reveals soft abdomen with tenderness in mid-abdomen as well as at hypogastric area and left lower quadrant.  No organomegaly or masses noted.  RECTAL:  Deferred.  EXTREMITIES:  Thin with no clubbing or edema noted.  LABORATORY AND ACCESSORY DATA:  WBC 7.3, H&H are 12.4 and 38.7, platelet count is 420,000.  Urinalysis reveals specific gravity of 1.010 and has moderate leukocyte esterases and 11-20 wbcs.  Serum sodium is 135, potassium 3.3, chloride 108, CO2 is 20, glucose 104, BUN is 38, creatinine is 1.6, calcium 9.4, albumin is 3.9, bilirubin 0.5, AP 91, AST 23, ALT 21.  ASSESSMENT:  The patient is a 70 year old Caucasian female with multiple medical problems who presents with a three-day history of nausea, vomiting and diarrhea which is nonbloody.  She also has abdominal pain but this is chronic. She is orthostatic and she has lost a significant amount of weight recently which possibly is due to dehydration.  I suspect her symptoms are due to some form of gastroenteritis.  I do not feel that she has a partial small-bowel obstruction.  PLAN: 1. We will treat her with IV fluids with Joyce Gross Ciel supplement as she is    hypokalemic. 2. Serum magnesium will be checked. 3. Urine culture will be obtained. 4. Will start on Cipro 200 mg IV q.12h. 5. Pain medication will be given parenterally. 6. We will keep her n.p.o. except for her p.o. medications. 7. Acute abdominal series will also be obtained. 8. MET-7 will be repeated in a.m. Dictated by:   Lionel December, M.D. Attending Physician:  Cassell Smiles DD:  11/02/01 TD:  11/03/01 Job: [id number]

## 2010-11-10 NOTE — H&P (Signed)
NAME:  [name], KREITER                        ACCOUNT NO.:  [id number]   MEDICAL RECORD NO.:  [id number]                   PATIENT TYPE:  INP   LOCATION:  A222                                 FACILITY:  [facility]   PHYSICIAN:  Lionel December, M.D.                 DATE OF BIRTH:  Oct 29, 1940   DATE OF ADMISSION:  12/30/2002  DATE OF DISCHARGE:                                HISTORY & PHYSICAL   PRESENTING COMPLAINTS:  1. Postural lightheadedness.  2. Syncopal episode.   HISTORY OF PRESENT ILLNESS:  This is a 70 year old Caucasian female with  multiple chronic conditions who has not been feeling well.  She was  hospitalized back in March with a gastroenteritis, possibly a viral  infection.  She improved with symptomatic therapy.  She was seen in the  office about two weeks ago and was noted to have lost 20 pounds.  She stated  that she had a very good appetite.  She has a chronic cough possibly related  to her GERD but I wanted her to be seen by a pulmonologist to make sure that  her cough was not due to bronchial or pulmonary problems but she has not yet  done so.   She does have an appointment to see Dr. [name] in [place] where she lives.  She  has not been feeling well for the last 3-4 days.  She gets dizzy and  lightheaded when she stands up.  She states she has been drinking a lot of  water.  She generally has a gallon of urine output lately according to the  patient.  She states she drinks at least as much water.  She went to her  [place] in [place] but came back two days ago because she was not  feeling well.  This morning she had multiple presyncopal episodes and one  time she just passed out completely.  She was brought to the emergency room  by her husband and daughter.  She was seen by Dr. Sherwood Gambler and noted to have  postural change.  She was felt to be dehydrated.  She had an EKG that did  not show any acute abnormality.  She also had normal Troponin and CPK  levels.   The  patient states she has a very good appetite.  She denies nausea or  vomiting.  She has intermittent heartburn and regurgitation.  She has a  cough which is dry.  She denies chest pain or dyspnea.  She has abdominal  pain but this is chronic in the lower abdomen more on the left side.  She  has chronic constipation but she did have a normal bowel movement today.  She has noted some discomfort due to her hemorrhoids.  She denies fever but  has had chills.  She also denies dysuria, hematuria or vaginal symptoms.   MEDICATIONS:  1. Bactrim DS daily.  2. Xanax  0.5 mg q.i.d.  3. Vicodin 5/500 q.i.d.  4. Nexium 40 mg b.i.d.  5. Doxepin 150 mg at bedtime.  6. Domperidone 10 mg t.i.d.  7. MiraLax 17 g daily.  8. Centrum one daily.  9. TUMS for calcium daily.    PAST MEDICAL HISTORY:  1. She has chronic abdominal pain.  2. 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  Her creatinine about two weeks ago was 1.6 or 1.8.  Her BUN     at that time was less than 30.  3. History of recurrent urinary tract infections on suppressive antibiotic     therapy.  4. She has depression.  5. Chronic insomnia.  6. Anxiety.  7. Chronic GERD.  8. 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  She has been     dependent on narcotics for abdominal pain.  Attempts at getting her off     the medication have been unsuccessful.  She was sent over to the [facility] in [place], within the last three years.  She was given     NSAIDs and she developed dependent edema and, therefore, did not go back     for followup.  9. She has had right nephrectomy with cystectomy.  10.      She has an ileal conduit.  11.      She has had multiple surgeries in the past for adhesions.  12.      She also had a cholecystectomy.  13.      At [facility] last year, had lysis of adhesions which was quite extensive     and she had resection of a short segment of her small bowel.  14.       She also has Raynaud's phenomenon.   ALLERGIES:  None known.   FAMILY HISTORY:  Father died of stomach cancer, age 20.  She has five  sisters and four brothers in fair health.  One brother died of a MI.  One  brother was recently diagnosed with lung carcinoma and is not doing well.  He is 71 years old.   SOCIAL HISTORY:  She is married and has two children.  She has been disabled  for years.  She smoked cigarettes x12 years but quit over ten years ago.  She does not drink alcohol.  She and her husband live in [place].   PHYSICAL EXAMINATION:  GENERAL:  This is a pleasant, well-developed, thin,  Caucasian female who does not appear to be in distress.  VITAL SIGNS:  Weight 121 pounds, height 5 feet 5-1/2 inches, pulse 102/min,  blood pressure in the emergency room in a sitting position 66/43,  respiratory rate 18 and temperature 97.2.  HEENT:  Conjunctivae pink.  Sclerae anicteric.  Oropharyngeal mucosa is  unremarkable.  NECK:  Without masses or thyromegaly.  JVD flat.  CARDIAC:  Regular rhythm, normal S1, S3.  No murmur or gallop noted.  LUNGS:  Clear to auscultation.  ABDOMEN:  Flat.  She has extensive scarring of her abdomen.  Bowel sounds  are normal.  Urine bag is full of clear almost watery urine.  Abdomen is  soft with tenderness in the lower abdomen, mainly at the LLQ.  No  organomegaly or masses.  EXTREMITIES:  She does not have peripheral edema or clubbing.   LABORATORY DATA:  WBC 8.7, H&H 10.9/32.7, MCV 93, platelet count 232,000.  Sodium 132,  potassium 3.3, chloride 112, CO2 14, glucose 108, BUN 49,  creatinine 2.2. Calcium 8.5, total bilirubin 0.6, AP 103, AST 18, ALT 21,  albumin 3.3, total protein 4.9.  Urine has 11-20 WBCs, 3-6 RBCs and many  bacteria.  Serum amylase 74, lipase 30.  Total CPK 35, CK-MB 2, Troponin  level 0.01.   Her EKG shows a normal sinus rhythm.  No acute abnormality noted.  ASSESSMENT:  Shaolin presents with progressive symptoms leading up  to syncope.  She was documented to be hypotensive and also noted to have orthostasis.  Her oral intake has been satisfactory.  She has not had any nausea, vomiting  or diarrhea.  I suspect that she is losing too much water via her kidneys.  This is felt to be due to loss of renal concentrating mechanism.  She also  has unexplained 20 pound weight loss and chronic cough which is possibly due  to gastroesophageal reflux disease; pulmonary disease has not been ruled  out.   PLAN:  She will be hydrated with IV fluids.  We will give her oral  potassium.  We will continue her usual medications but back off on her  Doxepin which could be making her orthostasis worse.  We will maintain her  on MiraLax, switch her to Cipro 500 mg p.o. b.i.d.  We will check her  cortisol level, MET-7 and serum magnesium in the a.m. and we will repeat her  renal ultrasound in the a.m. and see if there is any progression of her  hydronephrosis.                                               Lionel December, M.D.    NR/MEDQ  D:  12/30/2002  T:  12/30/2002  Job:  [id number]

## 2010-11-10 NOTE — Consult Note (Signed)
[name], [name]              ACCOUNT NO.:  [id number]   MEDICAL RECORD NO.:  [id number]          PATIENT TYPE:  INP   LOCATION:  A332                          FACILITY:  [facility]   PHYSICIAN:  Jorja Loa, M.D.DATE OF BIRTH:  Aug 29, 1940   DATE OF CONSULTATION:  03/05/2006  DATE OF DISCHARGE:                                   CONSULTATION   REASON FOR CONSULTATION:  Mild renal insufficiency.   [name] is a 70 year old female with past medical history of a single  kidney with a history of bleeding and, also, hydronephrosis. According to  the patient, her left kidney was removed during her last pregnancy because  of recurrent pyelonephrosis and, also, bleeding.  Following this, after a  year, because of also problems with her bladder, she had removal of bladder  which is followed with ureteral cystostomy.  Since then, the patient has had  recurrent urinary tract infections.  However, the patient denies any  previous history of renal insufficiency.  No history of kidney stones.  Presently, the patient comes with a history of nausea, vomiting, some  abdominal pain, and she was found to have an elevated creatinine up to 2.5  with hypokalemia, hence, consult is called.  Presently, her creatinine has  come down from 2.5 to 1.8.  The patient feels somewhat better, states she  has some nausea but no vomiting.  She denies any diarrhea.  She complains of  significant weight loss within the last couple of months.  She denies any  fever, chills, or sweating.   PAST MEDICAL HISTORY:  She has a history of anxiety disorder. History of  right nephrectomy possibly related to bleeding and, also, recurrent  pyelonephritis.  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  She also has a problem with GI bleed.  She states  that she has also a problem with history of weight loss and recurrent  nausea  and vomiting.   MEDICATIONS:  Cipro 400 mg IV q.24h., she is getting IV fluids at 125 mL per  hour, Protonix 40 mg IV daily, Dilaudid, Ativan, Zofran.   ALLERGIES:  No known drug allergies.   SOCIAL HISTORY:  No history of smoking.  The patient lives at home with her  husband.  No history of illicit drug use.   REVIEW OF SYSTEMS:  The patient complains of weakness, nausea, but vomiting  has subsided.  She denies any fever, chills, or sweating.  She does not have  any diarrhea.  She complains of weight loss which is significant within the  last couple of months.  No swelling of the legs.   PHYSICAL EXAMINATION:  GENERAL:  The patient is alert in no apparent distress. She looks somewhat  emaciated.  VITAL SIGNS:  Temperature 98.7, pulse 82, blood pressure 103/60.  HEENT:  No conjunctival pallor, nonicteric, oral mucosa dry.  NECK:  Supple, no JVD.  CHEST:  Clear to auscultation, no rales, no rhonchi.  HEART:  Regular rate and rhythm, no  murmur.  ABDOMEN:  Soft, positive bowel sounds, she has the cystostomy on the right  side, it was revised, previously it was on the left side.  EXTREMITIES:  No edema.   Her input is 1200, output 700, with positive 500 fluid balance.  Her white  blood cell count 5.7, hemoglobin 11, hematocrit 32.5, platelets 385.  Sodium  142, potassium 3.5, when she came in, potassium was 2.9, BUN 26, creatinine  1.8. In September 8 when she came, BUN was 34, creatinine was 2.3.  On July  30, her creatinine was 2.5.  Her albumin is 3.3, calcium 8.3.  Liver  function tests are within normal range.  UA from yesterday shows specific  gravity 1.01, she has moderate blood, protein 30 mg per dl, she has positive  nitrites, trace leukocyte, and many bacteria, white blood cells 11-20, red  blood cells 7-10.  CT scan of the pelvis done on September 11 showed that  she has left hydronephrosis and hydroureter and she is status post  nephrectomy on the right.  She had  ultrasound done on March 03, 2004,  which showed mild left hydronephrosis.  Hence, the hydronephrosis might have  increased.   ASSESSMENT:  1. Renal insufficiency.  At this moment, probably acute, it could be a      combination of mild renal syndrome as the patient has nausea and      vomiting, decreased fluid intake, associated with the left      hydronephrosis.  Her BUN and creatinine seems to be improving.  The      patient is not oliguric.  2. Hypokalemia.  Presently, potassium is normal after replacement, hence,      not sure whether the hypokalemia is associated with a GI loss of renal      loss.  Her previous blood work showed a normal potassium, hence,      probably this could also be associated with her GI problem.  3. History of recurrent UTIs.  The patient is on antibiotics.  4. Nausea and vomiting.  I am not sure what the etiology is, she has been      followed by GI.  5. History of GERD and she is on Protonix.  6. History of anxiety disorder.   RECOMMENDATIONS:  Since her renal function is improving, will continue with  hydration.  However, since the patient has also hydronephrosis, it may be  advisable to involve urology to see whether the patient has some sort of  stricture.  We will decrease her IV fluids with potassium to 100 mL per  hour, continue other medications, follow the blood work.      Jorja Loa, M.D.  Electronically Signed     BB/MEDQ  D:  03/06/2006  T:  03/06/2006  Job:  [id number]

## 2010-12-13 ENCOUNTER — Encounter: Payer: Self-pay | Admitting: Internal Medicine

## 2011-01-29 ENCOUNTER — Encounter (INDEPENDENT_AMBULATORY_CARE_PROVIDER_SITE_OTHER): Payer: Self-pay | Admitting: *Deleted

## 2011-02-07 ENCOUNTER — Encounter (INDEPENDENT_AMBULATORY_CARE_PROVIDER_SITE_OTHER): Payer: Self-pay

## 2011-03-26 LAB — POCT I-STAT 4, (NA,K, GLUC, HGB,HCT)
Hemoglobin: 13.9
Potassium: 3.8

## 2011-03-27 ENCOUNTER — Encounter (INDEPENDENT_AMBULATORY_CARE_PROVIDER_SITE_OTHER): Payer: Self-pay | Admitting: Internal Medicine

## 2011-03-27 ENCOUNTER — Ambulatory Visit (INDEPENDENT_AMBULATORY_CARE_PROVIDER_SITE_OTHER): Payer: Medicare Other | Admitting: Internal Medicine

## 2011-03-27 VITALS — BP 120/72 | HR 70 | Temp 97.4°F | Resp 12 | Ht 65.0 in | Wt 126.0 lb

## 2011-03-27 DIAGNOSIS — K219 Gastro-esophageal reflux disease without esophagitis: Secondary | ICD-10-CM

## 2011-03-27 DIAGNOSIS — G4700 Insomnia, unspecified: Secondary | ICD-10-CM

## 2011-03-27 LAB — POCT I-STAT 4, (NA,K, GLUC, HGB,HCT)
Glucose, Bld: 75
HCT: 38
Hemoglobin: 12.9
Potassium: 5.4 — ABNORMAL HIGH
Sodium: 140

## 2011-03-27 MED ORDER — DOXEPIN HCL 75 MG PO CAPS: 75.0000 mg | ORAL_CAPSULE | Freq: Every day | ORAL | Status: DC

## 2011-03-27 MED ORDER — OMEPRAZOLE 20 MG PO CPDR: 20.0000 mg | DELAYED_RELEASE_CAPSULE | Freq: Every day | ORAL | Status: DC

## 2011-03-27 NOTE — Patient Instructions (Addendum)
Your omeprazole prescription has been sent to your pharmacy. Consultation with Dr. Gerilyn Pilgrim quad to be arranged for ataxia

## 2011-04-01 NOTE — Progress Notes (Signed)
Presenting complaint; followup for multiple issues. Subjective; patient is 70 year old Caucasian female who is her for scheduled visit accompanied by her. She was last seen on 10/23/2010. When she was discharged from [facility] she was given appointment with Dr.Hasanaj. Her husband says that it is hard to get all the prescriptions on time. She therefore wants to continue on her care through this office until she has another physician. He is seeing Dr. Caryn Section for her chronic kidney disease and Dr. Elita Boone her for CHF that she developed perioperatively when she had hip surgery. She has lost 4 pounds in last 4 months. She states she has a good appetite. Her heartburn is well controlled with medication. Her bowels move regularly. Her husband said she is much less confused than she was before; she apparently developed syncope because of low potassium and was treated. She is ambulating with the help of cane. She still losing her balance and now she complains of numbness in both her lower extremities. Current Outpatient Prescriptions on File Prior to Visit  Medication Sig Dispense Refill  . alendronate (FOSAMAX) 70 MG tablet Take 70 mg by mouth every 7 (seven) days. Take with a full glass of water on an empty stomach.       . ALPRAZolam (XANAX) 0.5 MG tablet Take 0.5 mg by mouth 3 (three) times daily as needed.        Marland Kitchen aspirin 81 MG tablet Take 81 mg by mouth daily.        . carvedilol (COREG) 25 MG tablet Take 1 tablet (25 mg total) by mouth 2 (two) times daily with a meal.  60 tablet  12  . ferrous gluconate (FERGON) 325 MG tablet Take 325 mg by mouth daily with breakfast.        . furosemide (LASIX) 40 MG tablet Take 1 tablet (40 mg total) by mouth daily.  30 tablet  12  . hydrALAZINE (APRESOLINE) 10 MG tablet Take 1 tablet (10 mg total) by mouth 3 (three) times daily.  90 tablet  12  . Hydrocodone-Acetaminophen (VICODIN PO) Take 1 tablet by mouth 4 (four) times daily.       . Multiple Vitamin (MULTIVITAMIN)  tablet Take 1 tablet by mouth daily.         objective BP 120/72  Pulse 70  Temp(Src) 97.4 F (36.3 C) (Oral)  Resp 12  Ht 5\' 5"  (1.651 m)  Wt 126 lb (57.153 kg)  BMI 20.97 kg/m2 Patient is alert and does not appear to be in any distress Conjunctiva is pink. Sclerae nonicteric Oropharyngeal mucosa is normal No neck masses or thyromegaly noted Cardiac exam with regular rhythm normal S1 and S2. No murmur gallop noted Her abdomen is flat with extensive scarring in left lower quadrant of her abdomen she has an ileal conduit in right lower quadrant; clear urine in the bag. No peripheral edema or clubbing noted Assessment; #1 chronic GERD. She is doing well with therapy. #2. Insomnia; she has tried to come off doxepin but hasn't worked; #3. Chronic abdominal pain of more than 30 years with dependence on narcotics. #4. Anxiety and depression. #5. Chronic kidney disease. She is under care of Dr. Caryn Section #6. History of CHF. Currently appears to be compensated;  #7. New onset of symptoms suggestive of peripheral neuropathy and gait disturbance. Plan New prescription for doxepin and omeprazole filled. Sedation with Dr. Gerilyn Pilgrim of neurology for gait disturbance and peripheral neuropathy.

## 2011-04-10 ENCOUNTER — Telehealth (INDEPENDENT_AMBULATORY_CARE_PROVIDER_SITE_OTHER): Payer: Self-pay | Admitting: *Deleted

## 2011-04-10 NOTE — Telephone Encounter (Signed)
She is inquiring on why hydrocodone was denied.

## 2011-04-11 NOTE — Telephone Encounter (Signed)
I called [name]  And spoke with [name]. He states that the prescription for the Hydrocodone had been filled and was ready for pick up.  I called and made [facility] aware.

## 2011-04-17 ENCOUNTER — Encounter: Payer: Self-pay | Admitting: Cardiovascular Disease

## 2011-04-19 ENCOUNTER — Encounter: Payer: Self-pay | Admitting: *Deleted

## 2011-04-20 ENCOUNTER — Encounter: Payer: Self-pay | Admitting: Cardiovascular Disease

## 2011-04-20 ENCOUNTER — Ambulatory Visit (INDEPENDENT_AMBULATORY_CARE_PROVIDER_SITE_OTHER): Payer: Medicare Other | Admitting: Cardiovascular Disease

## 2011-04-20 DIAGNOSIS — R531 Weakness: Secondary | ICD-10-CM | POA: Insufficient documentation

## 2011-04-20 DIAGNOSIS — R5381 Other malaise: Secondary | ICD-10-CM

## 2011-04-20 DIAGNOSIS — R079 Chest pain, unspecified: Secondary | ICD-10-CM | POA: Insufficient documentation

## 2011-04-20 DIAGNOSIS — I509 Heart failure, unspecified: Secondary | ICD-10-CM

## 2011-04-20 MED ORDER — NITROGLYCERIN 0.4 MG SL SUBL: 0.4000 mg | SUBLINGUAL_TABLET | SUBLINGUAL | Status: DC | PRN

## 2011-04-20 NOTE — Progress Notes (Signed)
[name] Date of Birth  1940/12/12  [facility] 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  She complains of some intermittent episodes of chest pain. She states that it feels like soreness puncture in the chest. It lasts for several minutes. It causes her some diaphoresis.  She has a history of congestive heart failure. Her ejection fraction in December 2001 was normal. Her most recent ejection fraction is now 25%.  Has a history of chronic renal insufficiency. When I saw her in November, 2011, she had acute renal insufficiency with a creatinine of 5.5. She also had mildly positive cardiac enzymes thought to be to her underlying medical illnesses.  She has a history of chronic renal insufficiency. She's followed by the nephrologists Marina Gravel, MD). She has had several episodes of acute renal failure and several attempts have been made to place an AV fistula in her. Had not been able to place an AV fistula because of her poor veins.  Shetold her that she was not a good candidate for dialysis.  Current Outpatient Prescriptions on File Prior to Visit  Medication Sig Dispense Refill  . alendronate (FOSAMAX) 70 MG tablet Take 70 mg by mouth every 7 (seven) days. Take with a full glass of water on an empty stomach.       . ALPRAZolam (XANAX) 0.5 MG tablet Take 0.5 mg by mouth 3 (three) times daily as needed.        Marland Kitchen aspirin 81 MG tablet Take 81 mg by mouth daily.        Marland Kitchen atenolol (TENORMIN) 50 MG tablet Take 50 mg by mouth Nightly. Taking 1/2 daily      . calcium citrate-vitamin D 200-200 MG-UNIT TABS Take 1 tablet by mouth daily.        . carvedilol (COREG) 25 MG tablet Take 1 tablet (25 mg total) by mouth 2 (two) times daily with a meal.  60 tablet  12  . doxepin (SINEQUAN) 75 MG capsule Take 1 capsule (75  mg total) by mouth at bedtime.  30 capsule  2  . ferrous gluconate (FERGON) 325 MG tablet Take 325 mg by mouth daily with breakfast.        . fish oil-omega-3 fatty acids 1000 MG capsule Take 1 g by mouth 4 (four) times daily.        . furosemide (LASIX) 40 MG tablet Take 1 tablet (40 mg total) by mouth daily.  30 tablet  12  . hydrALAZINE (APRESOLINE) 10 MG tablet Take 1 tablet (10 mg total) by mouth 3 (three) times daily.  90 tablet  12  . Hydrocodone-Acetaminophen (VICODIN PO) Take 1 tablet by mouth 4 (four) times daily.       . Multiple Vitamin (MULTIVITAMIN) tablet Take 1 tablet by mouth daily.        Marland Kitchen omeprazole (PRILOSEC) 20 MG capsule Take 1 capsule (20 mg total) by mouth daily.  30 capsule  11  she is not taking the Atenolol  Allergies  Allergen Reactions  . Demerol   . Morphine And Related   . Toradol Rash    Past Medical History  Diagnosis Date  . Hypertension   . Chronic kidney disease   . CHF (congestive heart failure)   . Gastroesophageal reflux   . Chronic UTI   .  Hydronephrosis     She has chronic mild left   . AV fistula     clotted in her left forearm  . Cellulitis   . Pneumothorax     secondary to central line placement  . Acute systolic heart failure     Again, EF 25-30%    Past Surgical History  Procedure Date  . Hip arthroplasty     left  . Kidney remove     1973  . Bladder removed     1974  . Small intestine surgery     1970 partial   . Cholecystectomy     2000    History  Smoking status  . Former Smoker  . Types: Cigarettes  . Quit date: 06/25/2004  Smokeless tobacco  . Never Used    History  Alcohol Use No    Family History  Problem Relation Age of Onset  . Heart attack Mother   . Healthy Daughter   . Healthy Daughter   . Healthy Son     Reviw of Systems:  Reviewed in the HPI.  All other systems are negative.  Physical Exam: BP 120/70  Pulse 52  Ht 5\' 5"  (1.651 m)  Wt 125 lb (56.7 kg)  BMI 20.80 kg/m2 The patient  is alert and oriented x 3.  The mood and affect are normal.   Skin: warm and dry.  Her skin seems to be pale.    HEENT:   Normal carotids, no JVD  Lungs: clear   Heart: RR, no murmurs    Abdomen: soft, good BS  Extremities:  No leg edema  Neuro:  Slow speech, limited mtoro strength, ( could not get up on exam table)  .  She was quite unsteady when she got up to leave.   ECG:  Assessment / Plan:

## 2011-04-20 NOTE — Patient Instructions (Addendum)
Your physician wants you to follow-up in: 6 months  You will receive a reminder letter in the mail two months in advance. If you don't receive a letter, please call our office to schedule the follow-up appointment.  Your physician recommends that you return for lab work in: 6 months/ bmp  Your physician has recommended you make the following change in your medication:   Start nitroglycerine 0.4mg  as needed for chest pain. Place one under you tongue for chest pain, may take another in 5 minutes if pain continues, may take one more in 5 minutes for chest pain. If pain continues call 911.

## 2011-04-20 NOTE — Assessment & Plan Note (Addendum)
She has a markedly reduced EF of 25%.  She's quite unsteady on her feet her heart rate is a little slow but she doesn't describe any episodes of syncope. She does have some syncope or profound weakness, we did decrease her carvedilol to 12.5 mg twice a day. We'll continue with her current medications for now.

## 2011-04-20 NOTE — Assessment & Plan Note (Addendum)
She is very weak and pale.  We have encouraged her to followup with her general medical Dr.

## 2011-04-20 NOTE — Assessment & Plan Note (Signed)
[name] presents today with some episodes of chest pain. Her overall health has been very poor and when I met her I thought that she would probably be confined to a nursing home for the rest of her life.  Her creatinine was 5.5 initially. The last creatinine that we have is 12.3.  She's been told that she is not a dialysis candidate because of her poor veins. Given that information, I do not think that which we should pursue a cardiac catheterization because of her relatively high risk of renal failure in the inability to do long term dialysis.  I've given her a prescription for nitroglycerin to take on an as-needed basis. I do not think that we need to do anything further with her.  I would think that she needs to be considered for nursing home placement because of this and her other multiple medical problems.

## 2011-05-11 ENCOUNTER — Other Ambulatory Visit (INDEPENDENT_AMBULATORY_CARE_PROVIDER_SITE_OTHER): Payer: Self-pay | Admitting: Internal Medicine

## 2011-05-14 NOTE — Telephone Encounter (Signed)
Prescription filled and to be faxed to the pharmacy. It is for hydrocodone/APAP 5/325 4 times a day when necessary, 120 doses

## 2011-07-09 ENCOUNTER — Telehealth: Payer: Self-pay | Admitting: Cardiovascular Disease

## 2011-07-09 ENCOUNTER — Other Ambulatory Visit (INDEPENDENT_AMBULATORY_CARE_PROVIDER_SITE_OTHER): Payer: Self-pay | Admitting: Internal Medicine

## 2011-07-09 ENCOUNTER — Telehealth: Payer: Self-pay | Admitting: *Deleted

## 2011-07-09 ENCOUNTER — Ambulatory Visit (INDEPENDENT_AMBULATORY_CARE_PROVIDER_SITE_OTHER): Payer: Medicare Other | Admitting: Internal Medicine

## 2011-07-09 ENCOUNTER — Encounter (INDEPENDENT_AMBULATORY_CARE_PROVIDER_SITE_OTHER): Payer: Self-pay | Admitting: Internal Medicine

## 2011-07-09 DIAGNOSIS — N189 Chronic kidney disease, unspecified: Secondary | ICD-10-CM

## 2011-07-09 DIAGNOSIS — R109 Unspecified abdominal pain: Secondary | ICD-10-CM

## 2011-07-09 DIAGNOSIS — K219 Gastro-esophageal reflux disease without esophagitis: Secondary | ICD-10-CM

## 2011-07-09 DIAGNOSIS — F419 Anxiety disorder, unspecified: Secondary | ICD-10-CM

## 2011-07-09 DIAGNOSIS — F411 Generalized anxiety disorder: Secondary | ICD-10-CM

## 2011-07-09 MED ORDER — HYDROCODONE-ACETAMINOPHEN 5-300 MG PO TABS: 5.0000 mg | ORAL_TABLET | Freq: Four times a day (QID) | ORAL | Status: DC

## 2011-07-09 MED ORDER — ALPRAZOLAM 0.5 MG PO TABS: 0.5000 mg | ORAL_TABLET | Freq: Three times a day (TID) | ORAL | Status: DC

## 2011-07-09 NOTE — Patient Instructions (Signed)
Please remember you cannot take more than recommended dose of your pain medicine as well as anxiety medication. Physician will contact you with results of blood work.

## 2011-07-09 NOTE — Progress Notes (Signed)
Presenting complaint; Followup for multiple symptoms. Subjective: [name] 71 year old Caucasian female who is here for scheduled visit. She was last seen on 03/27/2011. She has multiple complaints. She says she hasn't been able to sleep since does of her doxepin was decreased by Dr. Gerilyn Pilgrim. She complains of pain and swelling around the site of her ileal conduit and she says she's been passing less urine lately. On Christmas holidays she developed sore throat. She states she had difficulty eating foods. She would regurgitation she says she took extra pain pills since she lost some with emesis. She denies nausea vomiting melena or rectal bleeding. She says her bowels move regularly. She states she needs new prescription for pain medication and anxiolytic. She has used upper pain medication 4 days ahead of schedule. Her husband told me in confidence that she is taking his Xanax as well. She is able to and related better but she is using cane. She was seen by Dr. Gerilyn Pilgrim on 06/06/2019 1220 he felt her gait disorder is multifactorial. He recommended decreasing Sinequan to 50 mg at bedtime. She had the EEG last week and it is within normal limits. Her weight is up by 4 pounds since her last visit. Current Medications: Current Outpatient Prescriptions  Medication Sig Dispense Refill  . alendronate (FOSAMAX) 70 MG tablet Take 70 mg by mouth every 7 (seven) days. Take with a full glass of water on an empty stomach.       . ALPRAZolam (XANAX) 0.5 MG tablet Take 0.5 mg by mouth 3 (three) times daily as needed.        Marland Kitchen aspirin 81 MG tablet Take 81 mg by mouth daily.        . calcium citrate-vitamin D 200-200 MG-UNIT TABS Take 1 tablet by mouth daily.        . carvedilol (COREG) 25 MG tablet Take 1 tablet (25 mg total) by mouth 2 (two) times daily with a meal.  60 tablet  12  . doxepin (SINEQUAN) 75 MG capsule Take 1 capsule (75 mg total) by mouth at bedtime.  30 capsule  2  . ferrous gluconate (FERGON) 325 MG  tablet Take 325 mg by mouth daily with breakfast.        . fish oil-omega-3 fatty acids 1000 MG capsule Take 1 g by mouth 4 (four) times daily.        . furosemide (LASIX) 40 MG tablet Take 1 tablet (40 mg total) by mouth daily.  30 tablet  12  . hydrALAZINE (APRESOLINE) 10 MG tablet Take 1 tablet (10 mg total) by mouth 3 (three) times daily.  90 tablet  12  . Hydrocodone-Acetaminophen (VICODIN PO) Take 1 tablet by mouth 4 (four) times daily.       . Multiple Vitamin (MULTIVITAMIN) tablet Take 1 tablet by mouth daily.        . nitroGLYCERIN (NITROSTAT) 0.4 MG SL tablet Place 1 tablet (0.4 mg total) under the tongue every 5 (five) minutes as needed for chest pain.  25 tablet  3  . omeprazole (PRILOSEC) 20 MG capsule Take 1 capsule (20 mg total) by mouth daily.  30 capsule  11  . pyridOXINE (VITAMIN B-6) 100 MG tablet Take 100 mg by mouth daily.      . sodium bicarbonate 650 MG tablet Take 650 mg by mouth 2 (two) times daily.      . vitamin C (ASCORBIC ACID) 500 MG tablet Take 500 mg by mouth daily.  Objective: BP 112/68  Pulse 76  Temp(Src) 97.4 F (36.3 C) (Oral)  Resp 12  Ht 5\' 3"  (1.6 m)  Wt 130 lb (58.968 kg)  BMI 23.03 kg/m2 Patient is alert and responds appropriately to questions the Conjunctiva is pink. Sclera is nonicteric Oral pharyngeal mucosa is normal. No neck masses or thyromegaly noted. Cardiac exam with regular rhythm normal S1 and S2. No murmur or gallop noted. Lungs are clear to auscultation. Abdomen. One ileal conduit in right lower quadrant. Clear urine in bag. Stents and scarring primarily on the left side. On palpation abdomen is soft and nontender. No hernia noted around the ileo-conduit. No LE edema or clubbing noted.  Assessment: #1. Chronic abdominal pain she is taking more pain pills and recommended and this needs to stop. I have asked her husband that he should keep track of her pain medications as well as anxiolytic. #2. Chronic anxiety and  depression. #3. Chronic GERD. She appears to be doing well with therapy. #4. Chronic insomnia. Doxepin at current doses not working but I would defer this to Dr. Lilli Few. #5. Chronic kidney disease. Patient reports drop in her urine output. 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 #7. History of CHF. CHF appears to be compensated.    Plan:  New prescription written for hydrocodone/acetaminophen 5/320 doses with 2 refills but not to be filled earlier than 30 days. New prescription given for alprazolam (milligrams 3 times a day every day supply with 2 refills. She will go to the lab for CBC and metabolic 7. Office visit in 3 months.

## 2011-07-09 NOTE — Telephone Encounter (Signed)
Pt called wanting lab results/ she is mistaken, no labs done here, pt informed.

## 2011-07-09 NOTE — Telephone Encounter (Signed)
Called pt, she seemed very unsure of her own cp status on phone, wanted to know who the nephrologist was, explained that Dr Elease Hashimoto didn't name who the Dr was but that you may need to be seen sooner than 6 months. She is having occ cp and nitro is being used with cp resolved after one nitro tab. At this point she declines sooner f/u. Told her to call if sooner app needed. Pt agreed to plan.

## 2011-07-09 NOTE — Telephone Encounter (Signed)
Fu call °Patient returning your call about lab results  °

## 2011-07-10 ENCOUNTER — Telehealth: Payer: Self-pay | Admitting: *Deleted

## 2011-07-10 LAB — CBC
HCT: 34.3 % — ABNORMAL LOW (ref 36.0–46.0)
Hemoglobin: 10.7 g/dL — ABNORMAL LOW (ref 12.0–15.0)
MCHC: 31.2 g/dL (ref 30.0–36.0)
WBC: 10.4 10*3/uL (ref 4.0–10.5)

## 2011-07-10 LAB — BASIC METABOLIC PANEL
BUN: 57 mg/dL — ABNORMAL HIGH (ref 6–23)
Chloride: 104 mEq/L (ref 96–112)
Glucose, Bld: 85 mg/dL (ref 70–99)
Potassium: 4.9 mEq/L (ref 3.5–5.3)
Sodium: 138 mEq/L (ref 135–145)

## 2011-07-10 NOTE — Telephone Encounter (Signed)
RECEIVED FROM [facility] A NOTE STATING SHE WAS ON BOTH ATENOLOL AND CARVEDILOL. ATENOLOL WAS GIVEN BY PCP DR Valetta Fuller,. pT HAS STOPPED ATENOLOL IN December WHICH IS WHAT DR NAHSER WANTED DONE.

## 2011-07-24 ENCOUNTER — Telehealth (INDEPENDENT_AMBULATORY_CARE_PROVIDER_SITE_OTHER): Payer: Self-pay | Admitting: *Deleted

## 2011-07-24 NOTE — Telephone Encounter (Signed)
LM stating she has cut back on her medicine less than 3 daily, also the Xanax 2 to 3 daily. Her husband is complaining about how ill and nervous she is. Would like to speak with Tammy to see if this is normal. The return phone number is  [phone].

## 2011-08-03 NOTE — Telephone Encounter (Signed)
Patient was advised that she was to only take the Xanax as had been instructed by Dr. Karilyn Cota.She has a office appointment in a few months with [place] .

## 2011-09-11 ENCOUNTER — Encounter: Payer: Self-pay | Admitting: Cardiovascular Disease

## 2011-10-08 ENCOUNTER — Ambulatory Visit (INDEPENDENT_AMBULATORY_CARE_PROVIDER_SITE_OTHER): Payer: Medicare Other | Admitting: Internal Medicine

## 2011-10-08 ENCOUNTER — Encounter (INDEPENDENT_AMBULATORY_CARE_PROVIDER_SITE_OTHER): Payer: Self-pay | Admitting: Internal Medicine

## 2011-10-08 VITALS — BP 130/70 | HR 72 | Temp 98.7°F | Resp 18 | Ht 65.0 in | Wt 121.9 lb

## 2011-10-08 DIAGNOSIS — F411 Generalized anxiety disorder: Secondary | ICD-10-CM

## 2011-10-08 DIAGNOSIS — G4700 Insomnia, unspecified: Secondary | ICD-10-CM | POA: Insufficient documentation

## 2011-10-08 DIAGNOSIS — F419 Anxiety disorder, unspecified: Secondary | ICD-10-CM

## 2011-10-08 DIAGNOSIS — R1032 Left lower quadrant pain: Secondary | ICD-10-CM

## 2011-10-08 DIAGNOSIS — F329 Major depressive disorder, single episode, unspecified: Secondary | ICD-10-CM

## 2011-10-08 DIAGNOSIS — R109 Unspecified abdominal pain: Secondary | ICD-10-CM

## 2011-10-08 DIAGNOSIS — K219 Gastro-esophageal reflux disease without esophagitis: Secondary | ICD-10-CM

## 2011-10-08 MED ORDER — HYDROCODONE-ACETAMINOPHEN 5-500 MG PO TABS: 2.0000 | ORAL_TABLET | Freq: Two times a day (BID) | ORAL | Status: DC | PRN

## 2011-10-08 MED ORDER — ALPRAZOLAM 0.5 MG PO TABS: 0.5000 mg | ORAL_TABLET | Freq: Three times a day (TID) | ORAL | Status: DC

## 2011-10-08 NOTE — Patient Instructions (Signed)
Take hydrocodone/acetaminophen 500 one tablet up to twice a day as needed for pain. Please do not take OTC NSAIDs such as Advil naproxen and Aleve etc.

## 2011-10-09 NOTE — Progress Notes (Signed)
Follow up for GERD and chronic abdominal pain.  SUBJECTIVE:  [name] is a 71 year old Caucasian female who is here for scheduled visit, accompanied by her husband, [name].  She was last seen 3 months ago.  She states she is doing reasonably well.  She has had intermittent unsteady gait, but she has not had any falling episodes.  She was seen by Dr. Caryn Section last week and felt to be doing well.  She is also seeing Dr. Elease Hashimoto for CHF and cardiomyopathy.  The patient states that she did not fill her pain medication because her co-pay went up to 38 dollars.  She has been using her husband's pain medication; however, she is rarely taking more than two a day.  She says the heartburn is well controlled with therapy.  She denies nausea, vomiting, or dysphagia.  She is using small volume tap water enemas every now and then.  She denies melena or rectal bleeding.  She continues to complain of pain in left lower quadrant of her abdomen.  CURRENT MEDICATIONS:   Current Outpatient Prescriptions on File Prior to Visit  Medication Sig Dispense Refill  . alendronate (FOSAMAX) 70 MG tablet Take 70 mg by mouth every 7 (seven) days. Take with a full glass of water on an empty stomach.       . ALPRAZolam (XANAX) 0.5 MG tablet Take 1 tablet (0.5 mg total) by mouth 3 (three) times daily.  90 tablet  2  . aspirin 81 MG tablet Take 81 mg by mouth daily.        . calcium citrate-vitamin D 200-200 MG-UNIT TABS Take 1 tablet by mouth daily.        . carvedilol (COREG) 25 MG tablet Take 1 tablet (25 mg total) by mouth 2 (two) times daily with a meal.  60 tablet  12  . doxepin (SINEQUAN) 75 MG capsule Take 1 capsule (75 mg total) by mouth at bedtime.  30 capsule  2  . ferrous gluconate (FERGON) 325 MG tablet Take 325 mg by mouth daily with breakfast.        . fish oil-omega-3 fatty acids 1000 MG capsule Take 1 g by mouth 4 (four) times daily.        . furosemide (LASIX) 40 MG tablet Take 1 tablet (40 mg total) by mouth daily.  30 tablet   12  . hydrALAZINE (APRESOLINE) 10 MG tablet Take 1 tablet (10 mg total) by mouth 3 (three) times daily.  90 tablet  12  . Multiple Vitamin (MULTIVITAMIN) tablet Take 1 tablet by mouth daily.        . nitroGLYCERIN (NITROSTAT) 0.4 MG SL tablet Place 1 tablet (0.4 mg total) under the tongue every 5 (five) minutes as needed for chest pain.  25 tablet  3  . omeprazole (PRILOSEC) 20 MG capsule Take 1 capsule (20 mg total) by mouth daily.  30 capsule  11  . pyridOXINE (VITAMIN B-6) 100 MG tablet Take 100 mg by mouth daily.      . sodium bicarbonate 650 MG tablet Take 650 mg by mouth 2 (two) times daily.      . vitamin C (ASCORBIC ACID) 500 MG tablet Take 500 mg by mouth daily.      Marland Kitchen HYDROcodone-acetaminophen (VICODIN) 5-500 MG per tablet Take 2 tablets by mouth 2 (two) times daily between meals as needed for pain.  60 tablet  2     OBJECTIVE:   BP 130/70  Pulse 72  Temp(Src) 98.7 F (37.1  C) (Oral)  Resp 18  Ht 5\' 5"  (1.651 m)  Wt 121 lb 14.4 oz (55.293 kg)  BMI 20.29 kg/m2  GENERAL:  The patient is alert and is in no acute distress.  She is able to move from chair to examination table with help of a cane.  HEENT:  Conjunctivae are pink.  Sclerae are nonicteric.  Oropharyngeal mucosa is normal.  NECK;  No neck masses or thyromegaly noted.  LUNGS:  Clear to auscultation.  CARDIAC:  Regular rhythm.  Normal S1, S2.  No murmur or gallop noted.  ABDOMEN:  Flat with clear urine in the bag.  She has ileal conduit in right lower quadrant.  She has extensive scarring in left lower quadrant.  Abdomen is soft with mild tenderness in the area where she has scarring at LLQ.  EXTREMITIES:  No peripheral edema or clubbing noted.  LABORATORY DATA:  From September 11, 2011, BUN 33, creatinine 1.67.  Bilirubin 0.2, AP 105, AST 24, ALT 24, and albumin was 4.1.  Calcium 9.2.  H and H were 12.4 and 38.9, MCV 98.2, WBC 7.4, and platelet count 230,000.  ASSESSMENT:   1. Chronic abdominal pain.  She has been using  narcotics for over 30 years.  She is actually using hydrocodone no more than twice a day.  Therefore, this is a good time for [place] to decrease the dose to b.i.d.  We will change her prescription to 5/500 instead of 5/300.  Hopefully, this will be cheaper for her. 2. Chronic insomnia.  The patient is on doxepin.  We will consider reducing the dose on her next visit. 3. Anxiety neurosis.  She needs to refill her Xanax.  RECOMMENDATIONS:  New prescription given for alprazolam for 0.5 mg 90 doses with 3 refills.   New prescription given for hydrocodone and acetaminophen 5/500 one b.i.d. 60 with 2 refills to be filled no earlier than 30 days.   Office visit in 3 months.

## 2011-10-12 ENCOUNTER — Encounter (INDEPENDENT_AMBULATORY_CARE_PROVIDER_SITE_OTHER): Payer: Self-pay

## 2011-10-22 ENCOUNTER — Ambulatory Visit (INDEPENDENT_AMBULATORY_CARE_PROVIDER_SITE_OTHER): Payer: Medicare Other | Admitting: Cardiovascular Disease

## 2011-10-22 ENCOUNTER — Encounter: Payer: Self-pay | Admitting: Cardiovascular Disease

## 2011-10-22 VITALS — BP 110/60 | HR 53 | Ht 65.5 in | Wt 130.1 lb

## 2011-10-22 DIAGNOSIS — R079 Chest pain, unspecified: Secondary | ICD-10-CM

## 2011-10-22 DIAGNOSIS — I509 Heart failure, unspecified: Secondary | ICD-10-CM

## 2011-10-22 DIAGNOSIS — K219 Gastro-esophageal reflux disease without esophagitis: Secondary | ICD-10-CM

## 2011-10-22 LAB — BASIC METABOLIC PANEL
BUN: 42 mg/dL — ABNORMAL HIGH (ref 6–23)
Calcium: 8.6 mg/dL (ref 8.4–10.5)
Chloride: 91 mEq/L — ABNORMAL LOW (ref 96–112)
Creatinine, Ser: 2 mg/dL — ABNORMAL HIGH (ref 0.4–1.2)

## 2011-10-22 MED ORDER — CARVEDILOL 25 MG PO TABS: 25.0000 mg | ORAL_TABLET | Freq: Two times a day (BID) | ORAL | Status: DC

## 2011-10-22 MED ORDER — OMEPRAZOLE 20 MG PO CPDR: 20.0000 mg | DELAYED_RELEASE_CAPSULE | Freq: Every day | ORAL | Status: DC

## 2011-10-22 NOTE — Progress Notes (Signed)
[name] Date of Birth  May 31, 1941 [facility] 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  She complains of some intermittent episodes of chest pain. She states that it feels like soreness  in the chest. It lasts for several minutes. It causes her some diaphoresis.  She has a history of congestive heart failure. Her ejection fraction in December 2001 was normal. Her most recent ejection fraction is now 25%.  Has a history of chronic renal insufficiency. When I saw her in November, 2011, she had acute renal insufficiency with a creatinine of 5.5. She also had mildly positive cardiac enzymes thought to be to her underlying medical illnesses.  She has a history of chronic renal insufficiency. She's followed by the nephrologists Marina Gravel, MD). She has had several episodes of acute renal failure and several attempts have been made to place an AV fistula in her. Had not been able to place an AV fistula because of her poor veins.   She continues to have some intermittent episodes of chest pain. These occur particularly when she bends over to make the bed.  She does not get any regular exercise. She does bring in the groceries on occasion she does not typically have any chest pain with activity.  Her bedroom is on the first floor of her house so she does not climb stairs.    Is fairly unsteady on her feet. She falls fairly frequently against the walls. She has a large bruise on her left arm.  She complains of being profoundly weak. She doesn't have much chest pain and it does not seem to limit her daily activities.  She appears to be generally stronger today than she was the last time I saw her.  Current Outpatient Prescriptions on File Prior to Visit  Medication Sig Dispense Refill  . alendronate (FOSAMAX) 70  MG tablet Take 70 mg by mouth every 7 (seven) days. Take with a full glass of water on an empty stomach.       . ALPRAZolam (XANAX) 0.5 MG tablet Take 1 tablet (0.5 mg total) by mouth 3 (three) times daily.  90 tablet  2  . aspirin 81 MG tablet Take 81 mg by mouth daily.        . calcium citrate-vitamin D 200-200 MG-UNIT TABS Take 1 tablet by mouth daily.        . carvedilol (COREG) 25 MG tablet Take 1 tablet (25 mg total) by mouth 2 (two) times daily with a meal.  60 tablet  12  . doxepin (SINEQUAN) 75 MG capsule Take 1 capsule (75 mg total) by mouth at bedtime.  30 capsule  2  . ferrous gluconate (FERGON) 325 MG tablet Take 325 mg by mouth daily with breakfast.        . fish oil-omega-3 fatty acids 1000 MG capsule Take 1 g by mouth 4 (four) times daily.        . furosemide (LASIX) 40 MG tablet Take 1 tablet (40 mg total) by mouth daily.  30 tablet  12  . hydrALAZINE (APRESOLINE) 10 MG tablet Take 1 tablet (10 mg total) by mouth 3 (three) times daily.  90 tablet  12  . HYDROcodone-acetaminophen (VICODIN) 5-500 MG per tablet Take 2 tablets by mouth 2 (two) times daily between meals as needed for  pain.  60 tablet  2  . Multiple Vitamin (MULTIVITAMIN) tablet Take 1 tablet by mouth daily.        . nitroGLYCERIN (NITROSTAT) 0.4 MG SL tablet Place 1 tablet (0.4 mg total) under the tongue every 5 (five) minutes as needed for chest pain.  25 tablet  3  . omeprazole (PRILOSEC) 20 MG capsule Take 1 capsule (20 mg total) by mouth daily.  30 capsule  11  . pyridOXINE (VITAMIN B-6) 100 MG tablet Take 100 mg by mouth daily.      . sodium bicarbonate 650 MG tablet Take 650 mg by mouth 2 (two) times daily.      . vitamin C (ASCORBIC ACID) 500 MG tablet Take 500 mg by mouth daily.      she is not taking the Atenolol  Allergies  Allergen Reactions  . Demerol   . Morphine And Related   . Toradol Rash    Past Medical History  Diagnosis Date  . Hypertension   . Chronic kidney disease   . CHF (congestive  heart failure)   . Gastroesophageal reflux   . Chronic UTI   . Hydronephrosis     She has chronic mild left   . AV fistula     clotted in her left forearm  . Cellulitis   . Pneumothorax     secondary to central line placement  . Acute systolic heart failure     Again, EF 25-30%    Past Surgical History  Procedure Date  . Hip arthroplasty     left  . Kidney remove     1973  . Bladder removed     1974  . Small intestine surgery     1970 partial   . Cholecystectomy     2000    History  Smoking status  . Former Smoker  . Types: Cigarettes  . Quit date: 06/25/2004  Smokeless tobacco  . Never Used    History  Alcohol Use No    Family History  Problem Relation Age of Onset  . Heart attack Mother   . Healthy Daughter   . Healthy Daughter   . Healthy Son     Reviw of Systems:  Reviewed in the HPI.  All other systems are negative.  Physical Exam: BP 110/60  Pulse 53  Ht 5' 5.5" (1.664 m)  Wt 130 lb 1.9 oz (59.022 kg)  BMI 21.32 kg/m2 The patient is alert and oriented x 3.  The mood and affect are normal.   Skin: warm and dry.  Her skin seems to be pale.    HEENT:   Normal carotids, no JVD  Lungs: clear   Heart: RR, no murmurs    Abdomen: soft, good BS  Extremities:  No leg edema  Neuro:  Her strength has generally improved. When I saw her 6 months ago she was so weak that she could not get up on the exam table. Today she got up and down off the exam table without any significant difficulty. Her speech pattern is fairly clear. Previously, her speech was very slow  ECG: 10/22/2011-sinus bradycardia. Otherwise the EKG is normal. Assessment / Plan:

## 2011-10-22 NOTE — Patient Instructions (Signed)
Your physician has requested that you have an echocardiogram. Echocardiography is a painless test that uses sound waves to create images of your heart. It provides your doctor with information about the size and shape of your heart and how well your heart's chambers and valves are working. This procedure takes approximately one hour. There are no restrictions for this procedure.  Your physician wants you to follow-up in: 6 months  You will receive a reminder letter in the mail two months in advance. If you don't receive a letter, please call our office to schedule the follow-up appointment.  Your physician recommends that you return for lab work in: today bmet

## 2011-10-22 NOTE — Assessment & Plan Note (Signed)
[name] seems to be doing better. She has improved significant since her last office visit with me. During that time her speech was very slow and  she was incredibly weak. She was not able to get up on the exam table. Today her speech is much better. She strong enough to get up and down on the exam table quite easily.  I still think that she is at significant risk for invasive cardiovascular workup and I would hesitate to do a heart catheterization on her. She has a baseline creatinine of around 2.3 and I would hate to cause renal failure in her. I think dialysis would be challenging.  We have checked a basic metabolic profile today. I'll like to get an echocardiogram for further assessment of her left ventricular systolic function.  We'll see her again in 6 months.

## 2011-10-31 ENCOUNTER — Other Ambulatory Visit: Payer: Self-pay

## 2011-10-31 ENCOUNTER — Ambulatory Visit (HOSPITAL_COMMUNITY): Payer: Medicare Other | Attending: Cardiovascular Disease

## 2011-10-31 DIAGNOSIS — K219 Gastro-esophageal reflux disease without esophagitis: Secondary | ICD-10-CM

## 2011-10-31 DIAGNOSIS — I079 Rheumatic tricuspid valve disease, unspecified: Secondary | ICD-10-CM | POA: Insufficient documentation

## 2011-10-31 DIAGNOSIS — I10 Essential (primary) hypertension: Secondary | ICD-10-CM | POA: Insufficient documentation

## 2011-10-31 DIAGNOSIS — N289 Disorder of kidney and ureter, unspecified: Secondary | ICD-10-CM | POA: Insufficient documentation

## 2011-10-31 DIAGNOSIS — I509 Heart failure, unspecified: Secondary | ICD-10-CM

## 2011-11-05 ENCOUNTER — Other Ambulatory Visit: Payer: Self-pay | Admitting: *Deleted

## 2011-11-05 DIAGNOSIS — I509 Heart failure, unspecified: Secondary | ICD-10-CM

## 2011-11-05 MED ORDER — FUROSEMIDE 40 MG PO TABS: 40.0000 mg | ORAL_TABLET | Freq: Every day | ORAL | Status: DC

## 2011-11-05 MED ORDER — HYDRALAZINE HCL 10 MG PO TABS: 10.0000 mg | ORAL_TABLET | Freq: Three times a day (TID) | ORAL | Status: DC

## 2011-11-20 IMAGING — CR DG ABDOMEN ACUTE W/ 1V CHEST
3 series · 3 of 3 positions shown · non-contrast
Comparison: Chest x-ray of five 03/15/2008 and 02/28/2006

CLINICAL DATA: Nausea, weakness, weight loss

ACUTE ABDOMEN SERIES (ABDOMEN 2 VIEW & CHEST 1 VIEW)

[view not recorded (1 of 3)]
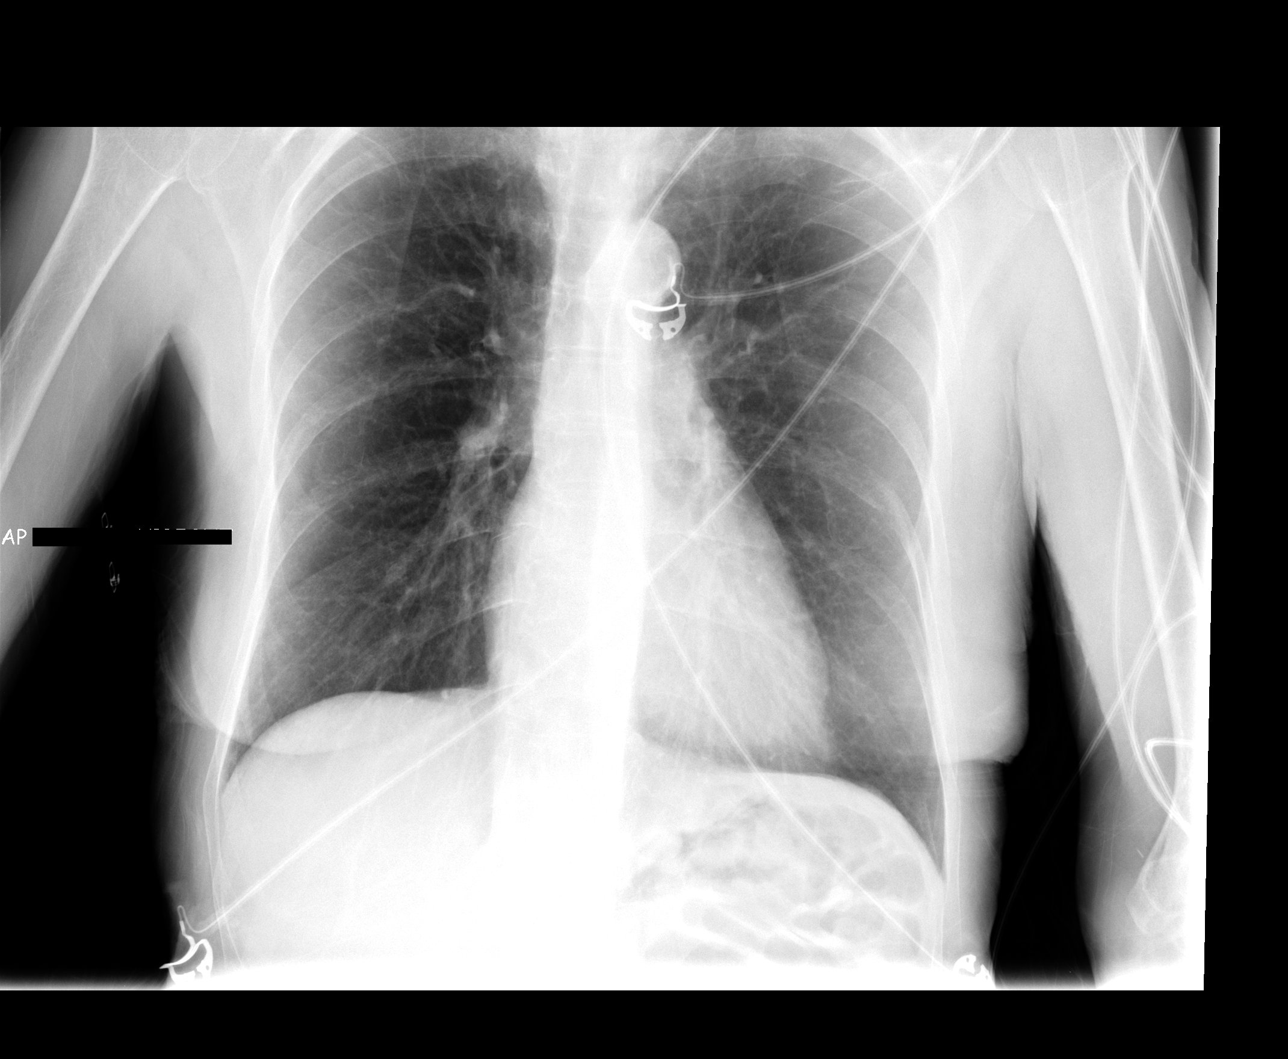

[view not recorded (2 of 3)]
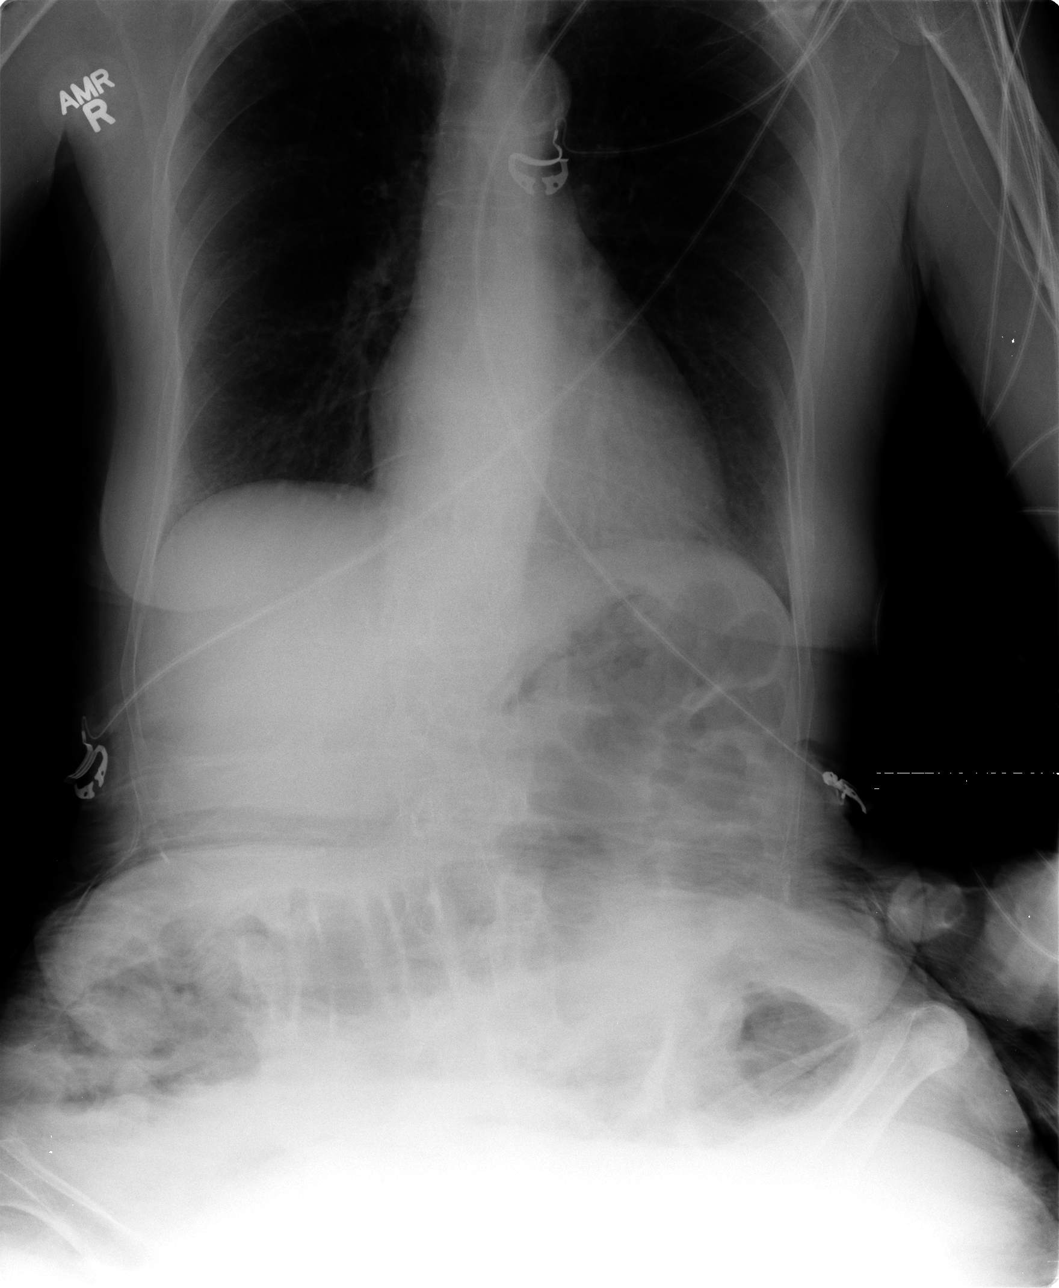

[view not recorded (3 of 3)]
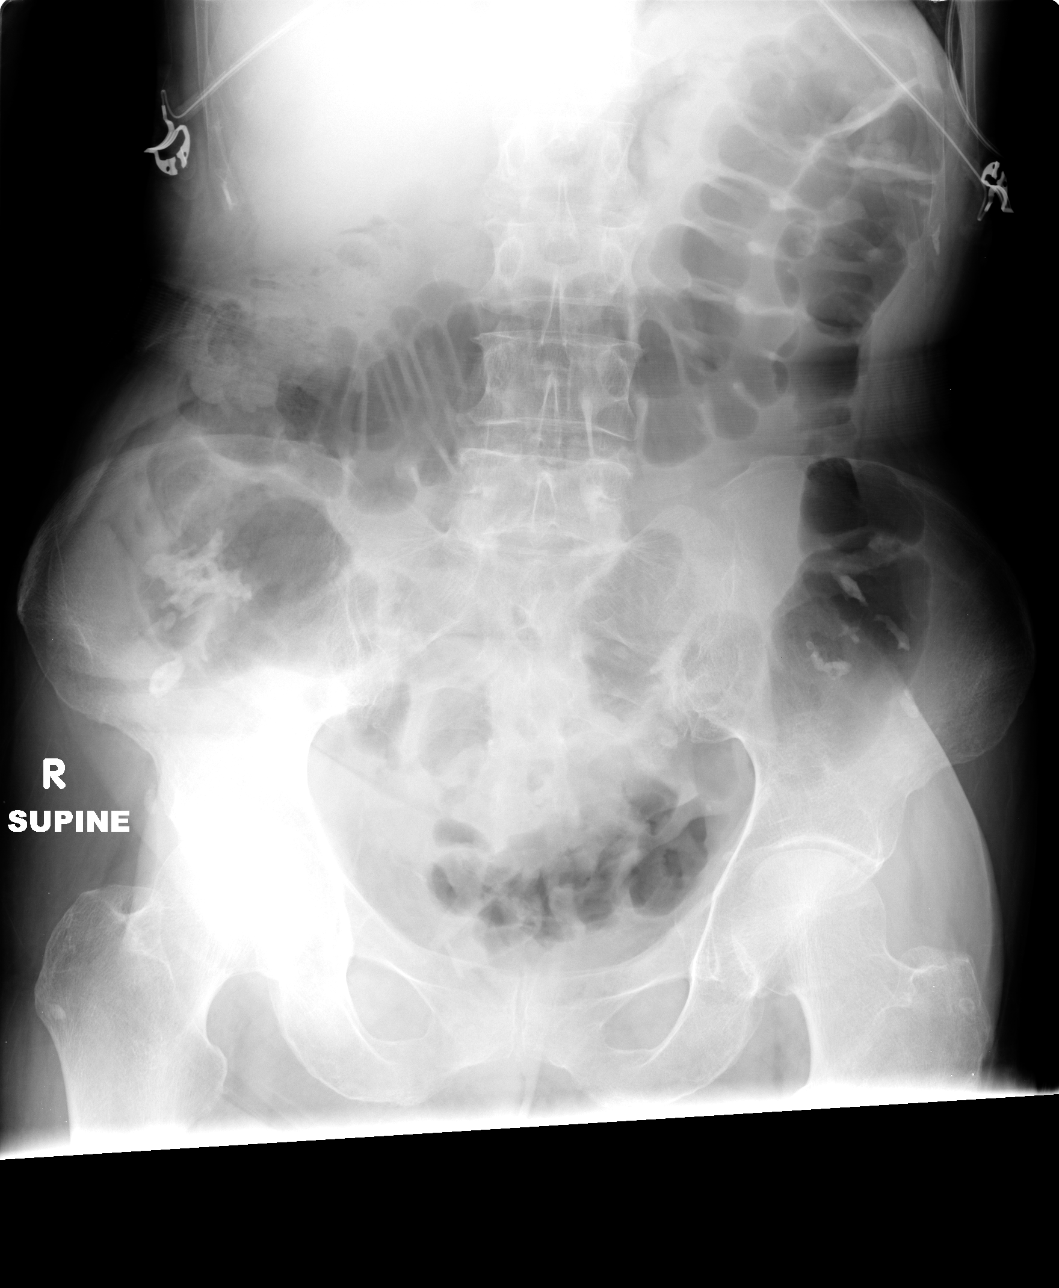

[3 of 3 positions shown; findings below may reference images not displayed]

FINDINGS: The lungs are clear and slightly hyperaerated.
Mediastinal contours are stable.  The heart is within normal limits
in size.

Supine and erect views the abdomen show minimal gaseous distention
of the colon.  No bowel obstruction is seen.  No free air is noted.
No bony abnormality is seen.
IMPRESSION: 1.  No active lung disease.  Slight hyperaeration.
2.  No bowel obstruction.  No free air.

## 2012-01-07 ENCOUNTER — Ambulatory Visit (INDEPENDENT_AMBULATORY_CARE_PROVIDER_SITE_OTHER): Payer: Medicare Other | Admitting: Internal Medicine

## 2012-01-07 ENCOUNTER — Encounter (INDEPENDENT_AMBULATORY_CARE_PROVIDER_SITE_OTHER): Payer: Self-pay | Admitting: Internal Medicine

## 2012-01-07 ENCOUNTER — Telehealth (INDEPENDENT_AMBULATORY_CARE_PROVIDER_SITE_OTHER): Payer: Self-pay | Admitting: *Deleted

## 2012-01-07 VITALS — BP 104/72 | HR 56 | Temp 97.6°F | Ht 65.5 in | Wt 132.0 lb

## 2012-01-07 DIAGNOSIS — R109 Unspecified abdominal pain: Secondary | ICD-10-CM

## 2012-01-07 DIAGNOSIS — F419 Anxiety disorder, unspecified: Secondary | ICD-10-CM

## 2012-01-07 DIAGNOSIS — K219 Gastro-esophageal reflux disease without esophagitis: Secondary | ICD-10-CM

## 2012-01-07 DIAGNOSIS — N189 Chronic kidney disease, unspecified: Secondary | ICD-10-CM

## 2012-01-07 DIAGNOSIS — R600 Localized edema: Secondary | ICD-10-CM

## 2012-01-07 DIAGNOSIS — R609 Edema, unspecified: Secondary | ICD-10-CM

## 2012-01-07 NOTE — Patient Instructions (Signed)
Physician will contact you with results of blood work when completed. 

## 2012-01-07 NOTE — Progress Notes (Signed)
Presenting complaint;  Followup for abdominal pain, GERD, anxiety and insomnia. Last visit 10/08/2011. Subjective:  Patient is 71 year old Caucasian female who is here for scheduled visit accompanied by her husband vein. She continues to improve as far as her ambulation is concerned. She is using cane to move around. She hasn't had any fall although she is at some imbalance and bumped into objects. Last year she was seen by Dr. Gerilyn Pilgrim who felt her gait disturbance was multifactorial but primarily secondary to neuropathy. Patient did not return for followup visit. She has good appetite. She states she is on low salt diet. She has developed lower extremity edema. She denies shortness of breath or PND. She was seen by Dr. Melburn Popper 2 months ago and had an echocardiogram and he felt she was doing well from cardiac standpoint. Her weight is up by 10 pounds since her last visit. She has occasional breakthrough heartburn. Bowels move daily or every other day. She denies melena or rectal bleeding. Her husband states that she doesn't usual household work. Patient states that she does not take her fluid pill daily.  Current Medications: Current Outpatient Prescriptions  Medication Sig Dispense Refill  . alendronate (FOSAMAX) 70 MG tablet Take 70 mg by mouth every 7 (seven) days. Take with a full glass of water on an empty stomach.       . ALPRAZolam (XANAX) 0.5 MG tablet Take 1 tablet (0.5 mg total) by mouth 3 (three) times daily.  90 tablet  2  . aspirin 81 MG tablet Take 81 mg by mouth daily.        . calcium citrate-vitamin D 200-200 MG-UNIT TABS Take 1 tablet by mouth daily.        . carvedilol (COREG) 25 MG tablet Take 1 tablet (25 mg total) by mouth 2 (two) times daily with a meal.  60 tablet  12  . doxepin (SINEQUAN) 75 MG capsule Take 1 capsule (75 mg total) by mouth at bedtime.  30 capsule  2  . fish oil-omega-3 fatty acids 1000 MG capsule Take 1 g by mouth as needed.       . furosemide (LASIX)  40 MG tablet Take 1 tablet (40 mg total) by mouth daily.  30 tablet  12  . hydrALAZINE (APRESOLINE) 10 MG tablet Take 1 tablet (10 mg total) by mouth 3 (three) times daily.  90 tablet  12  . HYDROcodone-acetaminophen (VICODIN) 5-500 MG per tablet Take 2 tablets by mouth 2 (two) times daily between meals as needed for pain.  60 tablet  2  . Multiple Vitamin (MULTIVITAMIN) tablet Take 1 tablet by mouth daily.        . nitroGLYCERIN (NITROSTAT) 0.4 MG SL tablet Place 1 tablet (0.4 mg total) under the tongue every 5 (five) minutes as needed for chest pain.  25 tablet  3  . omeprazole (PRILOSEC) 20 MG capsule Take 1 capsule (20 mg total) by mouth daily.  30 capsule  11  . sodium bicarbonate 650 MG tablet Take 650 mg by mouth 2 (two) times daily.      . vitamin C (ASCORBIC ACID) 500 MG tablet Take 500 mg by mouth daily.      . ferrous gluconate (FERGON) 325 MG tablet Take 325 mg by mouth daily with breakfast.        . pyridOXINE (VITAMIN B-6) 100 MG tablet Take 100 mg by mouth daily.         Objective: Blood pressure 104/72, pulse 56, temperature 97.6 F (  36.4 C), height 5' 5.5" (1.664 m), weight 132 lb (59.875 kg). Patient is alert and able to move from chair to examination table without assistance. Conjunctiva is pink. Sclera is nonicteric Oropharyngeal mucosa is normal. No neck masses or thyromegaly noted. Cardiac exam with regular rhythm normal S1 and S2. No murmur or gallop noted. Lungs are clear to auscultation. Abdomen ileal conduit in right lower quadrant. Extensive scarring and left lower quadrant of her abdomen. Abdomen is soft and nontender without organomegaly or masses 1+ staining edema to both legs slightly more on the left side. No calf tenderness noted.  Labs/studies Results: Echo results from 10/31/2011 noted.  Assessment:  #1. Chronic abdominal pain. Patient has been on narcotic therapy for close to 40 years. She is using less medication now than before. She is currently on  Vicodin 5 500 twice a day. #2. Chronic GERD. Patient doing well with therapy. #3. Gait problems. She is using cane to move around. He has seen neurologist in the past. #4. History of CHF. Echocardiography in May 2013 revealed normal systolic function with EF of 55-60%. Invasive evaluation not recommended because of chronic kidney disease and the risk associated with contrast. #5. Chronic kidney disease. Renal function has been stable. Her next appointment with Dr. Marina Gravel is in 2 months.   Plan:  Take furosemide 40 mg by mouth daily until LE edema resolves and after on an as-needed basis. Metabolic 7. Office visit in 4 months.

## 2012-01-07 NOTE — Telephone Encounter (Signed)
Alprazolam 0.5 mg - take 1 tablet three time daily. (must last one month)

## 2012-01-07 NOTE — Telephone Encounter (Signed)
This has been addressed.

## 2012-01-08 ENCOUNTER — Encounter (INDEPENDENT_AMBULATORY_CARE_PROVIDER_SITE_OTHER): Payer: Self-pay | Admitting: Internal Medicine

## 2012-01-14 ENCOUNTER — Inpatient Hospital Stay (HOSPITAL_COMMUNITY)
Admission: EM | Admit: 2012-01-14 | Discharge: 2012-01-17 | DRG: 092 | Disposition: A | Discharge status: Skilled Nursing Facility | Payer: Medicare Other | Attending: Internal Medicine | Admitting: Internal Medicine

## 2012-01-14 ENCOUNTER — Emergency Department (HOSPITAL_COMMUNITY): Payer: Medicare Other

## 2012-01-14 ENCOUNTER — Encounter (HOSPITAL_COMMUNITY): Payer: Self-pay | Admitting: Emergency Medicine

## 2012-01-14 DIAGNOSIS — Z936 Other artificial openings of urinary tract status: Secondary | ICD-10-CM

## 2012-01-14 DIAGNOSIS — A498 Other bacterial infections of unspecified site: Secondary | ICD-10-CM | POA: Diagnosis present

## 2012-01-14 DIAGNOSIS — G4700 Insomnia, unspecified: Secondary | ICD-10-CM

## 2012-01-14 DIAGNOSIS — I509 Heart failure, unspecified: Secondary | ICD-10-CM | POA: Diagnosis present

## 2012-01-14 DIAGNOSIS — R109 Unspecified abdominal pain: Secondary | ICD-10-CM

## 2012-01-14 DIAGNOSIS — W19XXXA Unspecified fall, initial encounter: Secondary | ICD-10-CM

## 2012-01-14 DIAGNOSIS — D649 Anemia, unspecified: Secondary | ICD-10-CM | POA: Diagnosis present

## 2012-01-14 DIAGNOSIS — F419 Anxiety disorder, unspecified: Secondary | ICD-10-CM

## 2012-01-14 DIAGNOSIS — G8929 Other chronic pain: Secondary | ICD-10-CM

## 2012-01-14 DIAGNOSIS — Z905 Acquired absence of kidney: Secondary | ICD-10-CM

## 2012-01-14 DIAGNOSIS — M25552 Pain in left hip: Secondary | ICD-10-CM

## 2012-01-14 DIAGNOSIS — Y832 Surgical operation with anastomosis, bypass or graft as the cause of abnormal reaction of the patient, or of later complication, without mention of misadventure at the time of the procedure: Secondary | ICD-10-CM | POA: Diagnosis present

## 2012-01-14 DIAGNOSIS — Z96649 Presence of unspecified artificial hip joint: Secondary | ICD-10-CM

## 2012-01-14 DIAGNOSIS — Z87891 Personal history of nicotine dependence: Secondary | ICD-10-CM

## 2012-01-14 DIAGNOSIS — K219 Gastro-esophageal reflux disease without esophagitis: Secondary | ICD-10-CM | POA: Diagnosis present

## 2012-01-14 DIAGNOSIS — I5022 Chronic systolic (congestive) heart failure: Secondary | ICD-10-CM | POA: Diagnosis present

## 2012-01-14 DIAGNOSIS — T82898A Other specified complication of vascular prosthetic devices, implants and grafts, initial encounter: Secondary | ICD-10-CM | POA: Diagnosis present

## 2012-01-14 DIAGNOSIS — Z9181 History of falling: Secondary | ICD-10-CM

## 2012-01-14 DIAGNOSIS — I129 Hypertensive chronic kidney disease with stage 1 through stage 4 chronic kidney disease, or unspecified chronic kidney disease: Secondary | ICD-10-CM | POA: Diagnosis present

## 2012-01-14 DIAGNOSIS — R269 Unspecified abnormalities of gait and mobility: Principal | ICD-10-CM | POA: Diagnosis present

## 2012-01-14 DIAGNOSIS — N179 Acute kidney failure, unspecified: Secondary | ICD-10-CM | POA: Diagnosis present

## 2012-01-14 DIAGNOSIS — Z7982 Long term (current) use of aspirin: Secondary | ICD-10-CM

## 2012-01-14 DIAGNOSIS — R55 Syncope and collapse: Secondary | ICD-10-CM | POA: Diagnosis present

## 2012-01-14 DIAGNOSIS — E860 Dehydration: Secondary | ICD-10-CM | POA: Diagnosis present

## 2012-01-14 DIAGNOSIS — Z79899 Other long term (current) drug therapy: Secondary | ICD-10-CM

## 2012-01-14 DIAGNOSIS — N189 Chronic kidney disease, unspecified: Secondary | ICD-10-CM | POA: Diagnosis present

## 2012-01-14 DIAGNOSIS — N1330 Unspecified hydronephrosis: Secondary | ICD-10-CM | POA: Diagnosis present

## 2012-01-14 DIAGNOSIS — Y92009 Unspecified place in unspecified non-institutional (private) residence as the place of occurrence of the external cause: Secondary | ICD-10-CM

## 2012-01-14 DIAGNOSIS — M25562 Pain in left knee: Secondary | ICD-10-CM

## 2012-01-14 DIAGNOSIS — N390 Urinary tract infection, site not specified: Secondary | ICD-10-CM | POA: Diagnosis present

## 2012-01-14 HISTORY — DX: Major depressive disorder, single episode, unspecified: F32.9

## 2012-01-14 HISTORY — DX: Chest pain, unspecified: R07.9

## 2012-01-14 HISTORY — DX: Other chronic pain: G89.29

## 2012-01-14 HISTORY — DX: Anemia, unspecified: D64.9

## 2012-01-14 HISTORY — DX: Acquired absence of kidney: Z90.5

## 2012-01-14 HISTORY — DX: Pneumonia, unspecified organism: J18.9

## 2012-01-14 HISTORY — DX: Unspecified osteoarthritis, unspecified site: M19.90

## 2012-01-14 HISTORY — DX: Unspecified dementia, unspecified severity, without behavioral disturbance, psychotic disturbance, mood disturbance, and anxiety: F03.90

## 2012-01-14 HISTORY — DX: Headache: R51

## 2012-01-14 HISTORY — DX: Anxiety disorder, unspecified: F41.9

## 2012-01-14 HISTORY — DX: Anesthesia of skin: R20.0

## 2012-01-14 HISTORY — DX: Cardiac murmur, unspecified: R01.1

## 2012-01-14 HISTORY — DX: Angina pectoris, unspecified: I20.9

## 2012-01-14 HISTORY — DX: Paresthesia of skin: R20.2

## 2012-01-14 HISTORY — DX: Repeated falls: R29.6

## 2012-01-14 HISTORY — DX: Depression, unspecified: F32.A

## 2012-01-14 HISTORY — DX: Cardiac arrhythmia, unspecified: I49.9

## 2012-01-14 HISTORY — DX: Acute myocardial infarction, unspecified: I21.9

## 2012-01-14 LAB — CBC WITH DIFFERENTIAL/PLATELET
Eosinophils Absolute: 0.1 10*3/uL (ref 0.0–0.7)
Eosinophils Relative: 1 % (ref 0–5)
HCT: 32.8 % — ABNORMAL LOW (ref 36.0–46.0)
Hemoglobin: 11 g/dL — ABNORMAL LOW (ref 12.0–15.0)
Lymphocytes Relative: 10 % — ABNORMAL LOW (ref 12–46)
Lymphs Abs: 1 10*3/uL (ref 0.7–4.0)
MCH: 31.4 pg (ref 26.0–34.0)
MCV: 93.7 fL (ref 78.0–100.0)
Monocytes Absolute: 0.7 10*3/uL (ref 0.1–1.0)
Monocytes Relative: 7 % (ref 3–12)
Platelets: 264 10*3/uL (ref 150–400)
RBC: 3.5 MIL/uL — ABNORMAL LOW (ref 3.87–5.11)
WBC: 10 10*3/uL (ref 4.0–10.5)

## 2012-01-14 LAB — BASIC METABOLIC PANEL
BUN: 42 mg/dL — ABNORMAL HIGH (ref 6–23)
CO2: 24 mEq/L (ref 19–32)
Calcium: 9 mg/dL (ref 8.4–10.5)
GFR calc non Af Amer: 27 mL/min — ABNORMAL LOW (ref >90)
Glucose, Bld: 112 mg/dL — ABNORMAL HIGH (ref 70–99)

## 2012-01-14 LAB — URINE MICROSCOPIC-ADD ON

## 2012-01-14 LAB — URINALYSIS, ROUTINE W REFLEX MICROSCOPIC
Bilirubin Urine: NEGATIVE
Glucose, UA: NEGATIVE mg/dL
Protein, ur: NEGATIVE mg/dL
Urobilinogen, UA: 0.2 mg/dL (ref 0.0–1.0)

## 2012-01-14 LAB — CARDIAC PANEL(CRET KIN+CKTOT+MB+TROPI)
Relative Index: 1.6 (ref 0.0–2.5)
Total CK: 140 U/L (ref 7–177)

## 2012-01-14 MED ORDER — OMEGA-3 FATTY ACIDS 1000 MG PO CAPS: 1.0000 g | ORAL_CAPSULE | Freq: Four times a day (QID) | ORAL | Status: DC

## 2012-01-14 MED ORDER — HYDRALAZINE HCL 10 MG PO TABS
10.0000 mg | ORAL_TABLET | Freq: Three times a day (TID) | ORAL | Status: DC
  Administered 2012-01-14 – 2012-01-17 (×8): 10 mg via ORAL
  Filled 2012-01-14 (×10): qty 1

## 2012-01-14 MED ORDER — NITROGLYCERIN 0.4 MG SL SUBL
0.4000 mg | SUBLINGUAL_TABLET | SUBLINGUAL | Status: DC | PRN
Start: 2012-01-14 — End: 2012-01-17

## 2012-01-14 MED ORDER — SODIUM CHLORIDE 0.9 % IV SOLN
INTRAVENOUS | Status: AC
  Administered 2012-01-14: 22:00:00 via INTRAVENOUS

## 2012-01-14 MED ORDER — DEXTROSE 5 % IV SOLN
1.0000 g | INTRAVENOUS | Status: DC
  Administered 2012-01-15 – 2012-01-16 (×2): 1 g via INTRAVENOUS
  Filled 2012-01-14 (×3): qty 10

## 2012-01-14 MED ORDER — DOXEPIN HCL 50 MG PO CAPS
50.0000 mg | ORAL_CAPSULE | Freq: Every day | ORAL | Status: DC
  Administered 2012-01-14 – 2012-01-16 (×3): 50 mg via ORAL
  Filled 2012-01-14 (×4): qty 1

## 2012-01-14 MED ORDER — SODIUM CHLORIDE 0.9 % IJ SOLN
3.0000 mL | Freq: Two times a day (BID) | INTRAMUSCULAR | Status: DC
  Administered 2012-01-14 – 2012-01-16 (×4): 3 mL via INTRAVENOUS

## 2012-01-14 MED ORDER — FERROUS GLUCONATE 324 (38 FE) MG PO TABS
325.0000 mg | ORAL_TABLET | Freq: Every day | ORAL | Status: DC
  Administered 2012-01-15 – 2012-01-16 (×2): 325 mg via ORAL
  Administered 2012-01-17: 324 mg via ORAL
  Filled 2012-01-14 (×4): qty 1

## 2012-01-14 MED ORDER — OMEGA-3-ACID ETHYL ESTERS 1 G PO CAPS
1.0000 g | ORAL_CAPSULE | Freq: Four times a day (QID) | ORAL | Status: DC
  Administered 2012-01-14 – 2012-01-17 (×11): 1 g via ORAL
  Filled 2012-01-14 (×14): qty 1

## 2012-01-14 MED ORDER — ALPRAZOLAM 0.5 MG PO TABS
0.5000 mg | ORAL_TABLET | Freq: Three times a day (TID) | ORAL | Status: DC
  Administered 2012-01-15 – 2012-01-16 (×6): 0.5 mg via ORAL
  Filled 2012-01-14 (×6): qty 1

## 2012-01-14 MED ORDER — FENTANYL CITRATE 0.05 MG/ML IJ SOLN
50.0000 ug | Freq: Once | INTRAMUSCULAR | Status: AC
  Administered 2012-01-14: 50 ug via INTRAVENOUS
  Filled 2012-01-14: qty 2

## 2012-01-14 MED ORDER — ADULT MULTIVITAMIN W/MINERALS CH
1.0000 | ORAL_TABLET | Freq: Every day | ORAL | Status: DC
  Administered 2012-01-14 – 2012-01-17 (×4): 1 via ORAL
  Filled 2012-01-14 (×4): qty 1

## 2012-01-14 MED ORDER — ASPIRIN 81 MG PO CHEW
81.0000 mg | CHEWABLE_TABLET | Freq: Every day | ORAL | Status: DC
  Administered 2012-01-14 – 2012-01-16 (×3): 81 mg via ORAL
  Filled 2012-01-14 (×3): qty 1

## 2012-01-14 MED ORDER — SODIUM BICARBONATE 650 MG PO TABS
650.0000 mg | ORAL_TABLET | Freq: Two times a day (BID) | ORAL | Status: DC
  Administered 2012-01-14 – 2012-01-17 (×6): 650 mg via ORAL
  Filled 2012-01-14 (×7): qty 1

## 2012-01-14 MED ORDER — ONE-DAILY MULTI VITAMINS PO TABS: 1.0000 | ORAL_TABLET | Freq: Every day | ORAL | Status: DC

## 2012-01-14 MED ORDER — PANTOPRAZOLE SODIUM 40 MG PO TBEC
40.0000 mg | DELAYED_RELEASE_TABLET | Freq: Every day | ORAL | Status: DC
  Administered 2012-01-14 – 2012-01-16 (×3): 40 mg via ORAL
  Filled 2012-01-14 (×3): qty 1

## 2012-01-14 MED ORDER — VITAMIN B-6 100 MG PO TABS
100.0000 mg | ORAL_TABLET | Freq: Every day | ORAL | Status: DC
  Administered 2012-01-14 – 2012-01-17 (×4): 100 mg via ORAL
  Filled 2012-01-14 (×4): qty 1

## 2012-01-14 MED ORDER — VITAMIN C 500 MG PO TABS
500.0000 mg | ORAL_TABLET | Freq: Every day | ORAL | Status: DC
  Administered 2012-01-14 – 2012-01-17 (×4): 500 mg via ORAL
  Filled 2012-01-14 (×4): qty 1

## 2012-01-14 MED ORDER — CALCIUM CITRATE-VITAMIN D 200-200 MG-UNIT PO TABS: 1.0000 | ORAL_TABLET | Freq: Every day | ORAL | Status: DC

## 2012-01-14 MED ORDER — CALCIUM CARBONATE-VITAMIN D 500-200 MG-UNIT PO TABS
1.0000 | ORAL_TABLET | Freq: Every day | ORAL | Status: DC
  Administered 2012-01-14 – 2012-01-17 (×4): 1 via ORAL
  Filled 2012-01-14 (×4): qty 1

## 2012-01-14 MED ORDER — CARVEDILOL 25 MG PO TABS
25.0000 mg | ORAL_TABLET | Freq: Two times a day (BID) | ORAL | Status: DC
  Administered 2012-01-14 – 2012-01-17 (×6): 25 mg via ORAL
  Filled 2012-01-14 (×8): qty 1

## 2012-01-14 MED ORDER — HEPARIN SODIUM (PORCINE) 5000 UNIT/ML IJ SOLN
5000.0000 [IU] | Freq: Three times a day (TID) | INTRAMUSCULAR | Status: DC
  Administered 2012-01-14 – 2012-01-16 (×7): 5000 [IU] via SUBCUTANEOUS
  Filled 2012-01-14 (×11): qty 1

## 2012-01-14 MED ORDER — ASPIRIN 81 MG PO TABS: 81.0000 mg | ORAL_TABLET | Freq: Every day | ORAL | Status: DC

## 2012-01-14 MED ORDER — DEXTROSE 5 % IV SOLN
1.0000 g | Freq: Once | INTRAVENOUS | Status: AC
  Administered 2012-01-14: 1 g via INTRAVENOUS
  Filled 2012-01-14: qty 10

## 2012-01-14 MED ORDER — MORPHINE SULFATE 2 MG/ML IJ SOLN
2.0000 mg | INTRAMUSCULAR | Status: DC | PRN
  Administered 2012-01-14: 2 mg via INTRAVENOUS
  Filled 2012-01-14 (×3): qty 1

## 2012-01-14 MED ORDER — HYDROCODONE-ACETAMINOPHEN 5-325 MG PO TABS
1.0000 | ORAL_TABLET | ORAL | Status: DC | PRN
  Administered 2012-01-14 – 2012-01-15 (×2): 2 via ORAL
  Administered 2012-01-15 (×2): 1 via ORAL
  Administered 2012-01-16 (×2): 2 via ORAL
  Filled 2012-01-14: qty 2
  Filled 2012-01-14: qty 1
  Filled 2012-01-14: qty 2
  Filled 2012-01-14: qty 1
  Filled 2012-01-14 (×2): qty 2

## 2012-01-14 NOTE — ED Notes (Signed)
Pain to left leg rating her pain 10/10, hx left hip surg  8yr ago stated daughter.

## 2012-01-14 NOTE — Progress Notes (Signed)
Pt arrived via stretcher from the ED. Pt is alert and oriented x3. She has severe pain in her left leg from a fall. She has an illeostomy to her RLQ draining urine. She has a bruise on her bil hips. No skin break down noted. Vs stable. Daughter at the bedside. Will cont to monitor.

## 2012-01-14 NOTE — ED Notes (Signed)
Family at bedside. 

## 2012-01-14 NOTE — ED Provider Notes (Signed)
[name] is a 71 y.o. female who fell yesterday.  She was on the floor for several hours.  Etiology of fall unknown.  Patient has old left leg pain with movement of the hip, knee, and back.  Normal pulses in both feet.  Patient is sedated after her second dose of narcotic, but is able to converse and cooperate with exam.  She has severe pain on any motion of the knee, and mild pain on motion of the hip.   Medical screening examination/treatment/procedure(s) were conducted as a shared visit with non-physician practitioner(s) and myself.  I personally evaluated the patient during the encounter  Flint Melter, MD 01/18/12 [phone]

## 2012-01-14 NOTE — ED Notes (Signed)
Per EMS pt fall last night around 6 pm,  Family member found her on the floor and put her back to bed, this morning pt started having left hip pain, left ankle pain, left leg.  Family wants to bring to ED to r/o any fxPt has hx of left hip repair

## 2012-01-14 NOTE — ED Provider Notes (Signed)
History     CSN: [id number]  Arrival date & time 01/14/12  1191   First MD Initiated Contact with Patient 01/14/12 0600      Chief Complaint  Patient presents with  . Hip Pain  . Fall    (Consider location/radiation/quality/duration/timing/severity/associated sxs/prior treatment) HPI Comments: Patient presents today after a fall last evening.  She currently lives with her husband.  When her husband came home at approximately 6pm last evening he found her on the floor.  He estimates that she was most likely on the floor for a couple of hours.  Fall occurred approximately 12 hours prior to arrival in the ED.  She was conscious when she was found.  She is unable to give any details of the fall and is unsure what caused the fall.  She has a history of dementia.  She was unable to ambulate after the fall.  Her husband had carried her and put her to bed.  She refused to come to the hospital last evening.  However, the pain continued she she was brought in today.  She is currently having pain of her left hip and her left knee.  She took Vicodin last evening for the pain, but does not feel that it helped.  PMH significant for left hip replacement approximately 1.5 years ago.  Surgery done by Dr. Dion Saucier.   She is currently on 81mg  Aspirin, but no other anticoagulants.  Patient has a history of frequent falls.  She has been evaluated by Dr. Gerilyn Pilgrim with Neurology for this in the past and her gait disturbance was thought to be multifactorial, but mostly secondary to neuropathy.  She currently uses a cane to ambulate.  Patient is a 71 y.o. female presenting with fall. The history is provided by the patient.  Fall Incident onset: Approximately 12 hours prior to arrival. She landed on carpet. There was no blood loss. She was not ambulatory at the scene. Pertinent negatives include no visual change, no fever, no numbness, no abdominal pain, no bowel incontinence, no nausea, no vomiting, no headaches, no loss  of consciousness and no tingling.    Past Medical History  Diagnosis Date  . Hypertension   . Chronic kidney disease   . CHF (congestive heart failure)   . Gastroesophageal reflux   . Chronic UTI   . Hydronephrosis     She has chronic mild left   . AV fistula     clotted in her left forearm  . Cellulitis   . Pneumothorax     secondary to central line placement  . Acute systolic heart failure     Again, EF 25-30%    Past Surgical History  Procedure Date  . Hip arthroplasty     left  . Kidney remove     1973  . Bladder removed     1974  . Small intestine surgery     1970 partial   . Cholecystectomy     2000  . Rod left leg     2012    Family History  Problem Relation Age of Onset  . Heart attack Mother   . Healthy Daughter   . Healthy Daughter   . Healthy Son     History  Substance Use Topics  . Smoking status: Former Smoker    Types: Cigarettes    Quit date: 06/25/2004  . Smokeless tobacco: Never Used  . Alcohol Use: No    OB History    Grav Para Term Preterm  Abortions TAB SAB Ect Mult Living                  Review of Systems  Constitutional: Negative for fever and chills.  HENT: Negative for neck pain and neck stiffness.   Eyes: Negative for visual disturbance.  Respiratory: Negative for cough and shortness of breath.   Cardiovascular: Negative for chest pain.  Gastrointestinal: Negative for nausea, vomiting, abdominal pain and bowel incontinence.  Musculoskeletal: Positive for gait problem. Negative for joint swelling.  Neurological: Positive for weakness. Negative for dizziness, tingling, loss of consciousness, light-headedness, numbness and headaches.  Psychiatric/Behavioral: Positive for confusion.    Allergies  Demerol; Morphine and related; and Ketorolac tromethamine  Home Medications   Current Outpatient Rx  Name Route Sig Dispense Refill  . ALENDRONATE SODIUM 70 MG PO TABS Oral Take 70 mg by mouth every 7 (seven) days. Take with  a full glass of water on an empty stomach.     . ALPRAZOLAM 0.5 MG PO TABS Oral Take 1 tablet (0.5 mg total) by mouth 3 (three) times daily. 90 tablet 2  . ASPIRIN 81 MG PO TABS Oral Take 81 mg by mouth daily.      Marland Kitchen CALCIUM CITRATE-VITAMIN D 200-200 MG-UNIT PO TABS Oral Take 1 tablet by mouth daily.      Marland Kitchen CARVEDILOL 25 MG PO TABS Oral Take 1 tablet (25 mg total) by mouth 2 (two) times daily with a meal. 60 tablet 12  . DOXEPIN HCL 75 MG PO CAPS Oral Take 1 capsule (75 mg total) by mouth at bedtime. 30 capsule 2  . FERROUS GLUCONATE 325 MG PO TABS Oral Take 325 mg by mouth daily with breakfast.      . OMEGA-3 FATTY ACIDS 1000 MG PO CAPS Oral Take 1 g by mouth as needed.     . FUROSEMIDE 40 MG PO TABS Oral Take 1 tablet (40 mg total) by mouth daily. 30 tablet 12  . HYDRALAZINE HCL 10 MG PO TABS Oral Take 1 tablet (10 mg total) by mouth 3 (three) times daily. 90 tablet 12  . HYDROCODONE-ACETAMINOPHEN 5-500 MG PO TABS Oral Take 2 tablets by mouth 2 (two) times daily between meals as needed for pain. 60 tablet 2  . ONE-DAILY MULTI VITAMINS PO TABS Oral Take 1 tablet by mouth daily.      Marland Kitchen NITROGLYCERIN 0.4 MG SL SUBL Sublingual Place 1 tablet (0.4 mg total) under the tongue every 5 (five) minutes as needed for chest pain. 25 tablet 3  . OMEPRAZOLE 20 MG PO CPDR Oral Take 1 capsule (20 mg total) by mouth daily. 30 capsule 11    Take 30 minutes before breakfast daily  . VITAMIN B-6 100 MG PO TABS Oral Take 100 mg by mouth daily.    . SODIUM BICARBONATE 650 MG PO TABS Oral Take 650 mg by mouth 2 (two) times daily.    Marland Kitchen VITAMIN C 500 MG PO TABS Oral Take 500 mg by mouth daily.      BP 146/73  Pulse 88  Temp 98.5 F (36.9 C) (Oral)  Resp 16  SpO2 95%  Physical Exam  Nursing note and vitals reviewed. Constitutional: She appears well-developed and well-nourished.  HENT:  Head: Normocephalic and atraumatic.  Mouth/Throat: Oropharynx is clear and moist.  Eyes: EOM are normal. Pupils are equal,  round, and reactive to light.  Neck: Normal range of motion. Neck supple.  Cardiovascular: Normal rate, regular rhythm and normal heart sounds.  Pulses:      Dorsalis pedis pulses are 2+ on the right side, and 2+ on the left side.  Pulmonary/Chest: Effort normal and breath sounds normal. No respiratory distress. She has no wheezes.  Abdominal: Soft.  Musculoskeletal:       Left hip: She exhibits decreased range of motion, tenderness and bony tenderness. She exhibits no swelling and no deformity.       Left knee: She exhibits decreased range of motion and bony tenderness. She exhibits no swelling, no effusion, no ecchymosis, no deformity and no erythema. tenderness found.       Cervical back: She exhibits normal range of motion, no tenderness, no bony tenderness, no swelling, no edema and no deformity.       Thoracic back: She exhibits normal range of motion, no tenderness, no bony tenderness, no swelling, no edema and no deformity.       Lumbar back: She exhibits normal range of motion, no tenderness, no bony tenderness, no swelling, no edema and no deformity.  Neurological: She is alert. No cranial nerve deficit or sensory deficit.  Skin: Skin is warm and dry. She is not diaphoretic. No erythema.  Psychiatric: She has a normal mood and affect.    ED Course  Procedures (including critical care time)   Labs Reviewed  CK  CBC WITH DIFFERENTIAL  BASIC METABOLIC PANEL  URINALYSIS, ROUTINE W REFLEX MICROSCOPIC   Dg Hip Complete Left  01/14/2012  *RADIOLOGY REPORT*  Clinical Data: Fall.  Pain.  LEFT HIP - COMPLETE 2+ VIEW  Comparison: 07/16/2010  Findings: Frontal pelvis shows diffuse bony demineralization.  No evidence for fracture.  AP and frog-leg lateral views of the left hip demonstrate a bipolar hemiarthroplasty.  No evidence for periprosthetic femur fracture.  IMPRESSION: No acute bony findings.  Original Report Authenticated By: ERIC A. MANSELL, M.D.   Dg Knee Complete 4 Views  Left  01/14/2012  *RADIOLOGY REPORT*  Clinical Data: Fall.  Pain.  LEFT KNEE - COMPLETE 4+ VIEW  Comparison: None.  Findings: Three-view exam shows diffuse demineralization.  No evidence for an acute fracture.  No subluxation or dislocation.  No joint effusion.  IMPRESSION: No acute findings.  Original Report Authenticated By: ERIC A. MANSELL, M.D.     No diagnosis found.  9:00 AM  Reassessed patient.  Patient screaming out in pain with ROM of left hip.  Will order CT hip. 12:00 PM Reassessed patient.  Patient continues to have significant pain with ROM of left hip.  CT negative.   1:59 PM Discussed with internal medicine teaching service.  They report that they will come see the patient in the ED.  MDM  Patient presents today after a fall.  PMH significant for previous left hip replacement.  She was apparently on the ground for a couple hours after the fall before she fell.  Xrays negative.  Patient continued to have significant pain with ROM of hip after pain medication.  Therefore, CT hip was ordered, which was also negative.  Patient neurovascularly intact.  Labs unremarkable.  Creatine elevated, but improved from her baseline.  CK within normal limits.   However, patient continued to have significant pain after several rounds of pain medication.  Therefore, patient admitted to the hospital for further management.           Pascal Lux Brimfield, PA-C 01/14/12 2154

## 2012-01-14 NOTE — ED Notes (Signed)
Patient transported to X-ray 

## 2012-01-14 NOTE — H&P (Signed)
Hospital Admission Note Date: 01/14/2012  Patient name: [name] Medical record number: [id number] Date of birth: [birth year] Age: 71 y.o. Gender: female PCP: Malissa Hippo, MD   Attending Physician:  Dr. Rogelia Boga  First Contact:  Dr. Earlene Plater   Pager:  [phone] Second Contact:  Dr. Saralyn Pilar    Pager:  [phone]      After Hours (after 5PM)/ Weekend / Holidays: First Contact:              Pager: [phone] Second Contact:         Pager: [phone]   Chief Complaint: Fall and possible syncope  History of Present Illness:  Pt is a 71yo F with a PMHX of CHF (recent echo showed normal EF), CRI (baseline Cr 1.4), and chronic anemia (baseline Hgb 10).  She presented to [facility] for evaluation of a fall. Patient notes that approximately 6 pm yesterday, she was found to be down on the ground by her husband after approximately a 4 hour period of time. The patient was unable to stand up and therefore, was picked up and placed in the bed by her husband. She noted left hip to be hurting and refused to go to the ER. She subsequently continued to be in pain, and approximately 12AM, started to scream out in pain, which prompted evaluation in the ED. In regards to her mental status, the patient returned to her baseline mental status immediately. No notable fecal or urine incontinence.  The patient has chronic chest pain that occurs 1 to 2 times a day.  She also has a history of GERD for which she take pantoprazole daily.  The chest pain is anginal in nature and occurs when she is "rushed" or is working around the house.  It is quickly relieved with NTG.  The daughters indicate the patient has been more weak over the last several days, although there has been a gradual deterioration in health over last year or so.  Her mood has been more labile over last few weeks as well.  The patient denied dizziness yesterday before the fall, but does endorse dizziness as a more chronic problem.  She also has had more  frequent headaches this weak, which have responded to aspirin.  Independence with ADLS: Y = Yes, Blank = No Bathing: Yes  Dressing: Yes  Toileting: Yes  Maintaining continence: Yes  Grooming: Yes  Feeding: Yes  Transferring: Yes   Uses a cane and a walker some of the time.   Review of Systems: Constitutional: Denies fever and chills.  Eyes:  Denies recent changes in vision, diplopia, and blurred vision. HENT: Denies recent changes in hearing. Respiratory: Denies SOB, DOE, cough, chest tightness,  and wheezing.   Cardiovascular: Denies chest pain and palpitations.  Gastrointestinal: Denies abdominal pain.  Genitourinary:  Urostomy - asymptomatic Musculoskeletal: Endorses pain in left hip and left leg.  Skin:  Endorses ecchymosis over right brachium laterally, otherwise negative.  Neurological: Endorses dizziness and recent increase in headaches that respond to ASA.  Psychiatric/Behavioral: Mood lability per HPI.   Past Medical History: Past Medical History  Diagnosis Date  . Hypertension   . Chronic kidney disease   . CHF (congestive heart failure)   . Gastroesophageal reflux   . Chronic UTI   . Hydronephrosis     She has chronic mild left   . AV fistula     clotted in her left forearm  . Cellulitis   . Pneumothorax     secondary to central  line placement  . Acute systolic heart failure     Again, EF 25-30%  . Single kidney    Past Surgical History  Procedure Date  . Hip arthroplasty     left  . Kidney remove     1973  . Bladder removed     1974  . Small intestine surgery     1970 partial   . Cholecystectomy     2000  . Rod left leg     2012    Home Medications: Current Outpatient Rx  Name Route Sig Dispense Refill  . ALENDRONATE SODIUM 70 MG PO TABS Oral Take 70 mg by mouth every 7 (seven) days. Take with a full glass of water on an empty stomach. Takes on Sunday.    . ALPRAZOLAM 0.5 MG PO TABS Oral Take 1 tablet (0.5 mg total) by mouth 3 (three)  times daily. 90 tablet 2  . ASPIRIN 81 MG PO TABS Oral Take 81 mg by mouth daily.      Marland Kitchen CALCIUM CITRATE-VITAMIN D 200-200 MG-UNIT PO TABS Oral Take 1 tablet by mouth daily.      Marland Kitchen CARVEDILOL 25 MG PO TABS Oral Take 1 tablet (25 mg total) by mouth 2 (two) times daily with a meal. 60 tablet 12  . DOXEPIN HCL 75 MG PO CAPS Oral Take 1 capsule (75 mg total) by mouth at bedtime. 30 capsule 2  . FERROUS GLUCONATE 325 MG PO TABS Oral Take 325 mg by mouth daily with breakfast.      . OMEGA-3 FATTY ACIDS 1000 MG PO CAPS Oral Take 1 g by mouth 4 (four) times daily.     . FUROSEMIDE 40 MG PO TABS Oral Take 1 tablet (40 mg total) by mouth daily. 30 tablet 12  . HYDRALAZINE HCL 10 MG PO TABS Oral Take 1 tablet (10 mg total) by mouth 3 (three) times daily. 90 tablet 12  . HYDROCODONE-ACETAMINOPHEN 5-500 MG PO TABS Oral Take 2 tablets by mouth 2 (two) times daily between meals as needed for pain. 60 tablet 2  . ONE-DAILY MULTI VITAMINS PO TABS Oral Take 1 tablet by mouth daily.      Marland Kitchen NITROGLYCERIN 0.4 MG SL SUBL Sublingual Place 1 tablet (0.4 mg total) under the tongue every 5 (five) minutes as needed for chest pain. 25 tablet 3  . OMEPRAZOLE 20 MG PO CPDR Oral Take 1 capsule (20 mg total) by mouth daily. 30 capsule 11    Take 30 minutes before breakfast daily  . VITAMIN B-6 100 MG PO TABS Oral Take 100 mg by mouth daily.    . SODIUM BICARBONATE 650 MG PO TABS Oral Take 650 mg by mouth 2 (two) times daily.    Marland Kitchen VITAMIN C 500 MG PO TABS Oral Take 500 mg by mouth daily.      Allergies: Allergies as of 01/14/2012 - Review Complete 01/14/2012  Allergen Reaction Noted  . Ketorolac tromethamine Rash 03/27/2011    Family and Social History: Family History  Problem Relation Age of Onset  . Heart attack Mother   . Healthy Daughter   . Healthy Daughter   . Healthy Son    History   Social History  . Marital Status: Married    Spouse Name: N/A    Number of Children: N/A  . Years of Education: N/A    Occupational History  . Not on file.   Social History Main Topics  . Smoking status: Former Smoker  Types: Cigarettes    Quit date: 06/25/2004  . Smokeless tobacco: Never Used  . Alcohol Use: No  . Drug Use: No  . Sexually Active: Not Currently   Other Topics Concern  . Not on file   Social History Narrative  . No narrative on file    Physical Exam: Vitals: Blood pressure 123/52, pulse 79, temperature 99.9 F (37.7 C), temperature source Oral, resp. rate 18, SpO2 99.00%. Constitutional: No distress.  HENT:  Head: Normocephalic and atraumatic.  Mouth/Throat: Uvula is midline. Mucous membranes are dry. No oropharyngeal exudate or posterior oropharyngeal erythema.  Eyes: Conjunctivae and EOM are normal. Pupils are equal, round, and reactive to light.  Neck: Trachea normal. No JVD present. Carotid bruit is not present. No mass and no thyromegaly present.  Cardiovascular: Normal rate, regular rhythm, normal heart sounds and normal pulses.   Respiratory: Effort normal. She has wheezes in the right upper field. She has rhonchi. She has no rales.  GI: Soft. Bowel sounds are normal.  Ileostomy present LLQ. Musculoskeletal:       Right hip: Normal.       Left hip: She exhibits tenderness.       Right knee: Normal.       Left knee: Normal.       Legs: extreme tenderness to palpation and hip flexion on the posterior and inferior aspect of the left thigh. Lymphadenopathy:  She has no cervical adenopathy.  Neurological: She is alert. She has normal strength. No cranial nerve deficit or sensory deficit.  Skin: Skin is warm, dry and intact. Bruising (right brachium) noted.    Lab results: Basic Metabolic Panel:  [facility] 01/14/12 0540  NA 136  K 4.3  CL 100  CO2 24  GLUCOSE 112*  BUN 42*  CREATININE 1.82*  CALCIUM 9.0  MG --  PHOS --   CBC:  Basename 01/14/12 0540  WBC 10.0  NEUTROABS 8.2*  HGB 11.0*  HCT 32.8*  MCV 93.7  PLT 264   Cardiac Enzymes:  Basename  01/14/12 0540  CKTOTAL 126  CKMB --  CKMBINDEX --  TROPONINI --   Urine Drug Screen:    Component Value Date/Time   LABOPIA POSITIVE* 07/18/2010 1057   COCAINSCRNUR NONE DETECTED 07/18/2010 1057   LABBENZ POSITIVE* 07/18/2010 1057   AMPHETMU NONE DETECTED 07/18/2010 1057   THCU NONE DETECTED 07/18/2010 1057   LABBARB  Value: NONE DETECTED        DRUG SCREEN FOR MEDICAL PURPOSES ONLY.  IF CONFIRMATION IS NEEDED FOR ANY PURPOSE, NOTIFY LAB WITHIN 5 DAYS.        LOWEST DETECTABLE LIMITS FOR URINE DRUG SCREEN Drug Class       Cutoff (ng/mL) Amphetamine      1000 Barbiturate      200 Benzodiazepine   200 Tricyclics       300 Opiates          300 Cocaine          300 THC              50 07/18/2010 1057    Urinalysis:  Basename 01/14/12 0812  COLORURINE YELLOW  LABSPEC 1.007  PHURINE 7.0  GLUCOSEU NEGATIVE  HGBUR TRACE*  BILIRUBINUR NEGATIVE  KETONESUR NEGATIVE  PROTEINUR NEGATIVE  UROBILINOGEN 0.2  NITRITE POSITIVE*  LEUKOCYTESUR SMALL*    Imaging results:  Dg Hip Complete Left 01/14/2012 IMPRESSION: No acute bony findings.  Ct Hip Left Wo Contrast 01/14/2012 IMPRESSION: Negative for fracture or acute abnormality.  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  Dg Knee Complete 4 Views Left 01/14/2012  IMPRESSION: No acute findings.   Assessment & Plan by Problem:  1.   Fall:  There are numerous etiologies in play here.  The patient has had a problem with gait instability and falls recently, but there is question about LOC during this fall.  When she fell this time, she was cleaning in the kitchen and on her way to the bedroom.  This makes orthostatic hypotension less likely as an etiology, as there was not a position change prior to the fall.  Also, there was no prodrome of dizziness or instability.  An acute illness stemming from a UTI (#2), however, could cause orthostatic hypotension or a worsening of gait.  The absence of warning signs makes cardiac  causes such as dysrhythmia more likely.  Her anginal symptoms seem stable; this has not been evaluated invasively because of renal insufficiency.  A stroke would be an unlikely cause in this case and the patient has no history of seizure activity.  With the patients deteriorating mental status, gait instability resulting in a fall without syncope is the most likely etiology and could be a secondary to polypharmacy. - Cardiac enzymes x3 - EKG - PT/OT to evaluate  2.   UTI:  Patient's bladder was surgically removed in 1974.  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  Urinalysis on admission was concerning for a UTI and UCx/BCx is pending.  We will start ceftriaxone. - Ceftriaxone daily - UCx and BCx pending  3.   Anemia:  Chronically anemic with a baseline that hovers around 10.  It was 11 on admission which likely represents a degree of hemoconcentration. - We will monitor as dehydration resolves - CBC in AM  4.   CKD:  Baseline creatinine is around 1.4 to 1.5.  It was 1.82 on admission with a BUN of 42.  This likely represents AKI superimposed on CKD secondary to dehydration.  It is most likely pre-renal from dehydration or possibly over diuresis.  She was given Lasix 40mg  QD on 7/15 and told to take this until LE edema resolved and then on a PRN basis going forward. - Recheck RFP in AM - IVF NS @ 75  5.   Prophylaxis:  heparin   Signed by:  Dorthula Rue. Earlene Plater, MD PGY-I, Internal Medicine  01/14/2012, 4:52 PM

## 2012-01-15 ENCOUNTER — Inpatient Hospital Stay (HOSPITAL_COMMUNITY): Payer: Medicare Other

## 2012-01-15 DIAGNOSIS — N390 Urinary tract infection, site not specified: Secondary | ICD-10-CM

## 2012-01-15 LAB — RENAL FUNCTION PANEL
CO2: 24 mEq/L (ref 19–32)
GFR calc Af Amer: 42 mL/min — ABNORMAL LOW (ref >90)
GFR calc non Af Amer: 37 mL/min — ABNORMAL LOW (ref >90)
Glucose, Bld: 96 mg/dL (ref 70–99)
Phosphorus: 3.1 mg/dL (ref 2.3–4.6)
Potassium: 3.8 mEq/L (ref 3.5–5.1)
Sodium: 145 mEq/L (ref 135–145)

## 2012-01-15 LAB — CARDIAC PANEL(CRET KIN+CKTOT+MB+TROPI)
CK, MB: 2.4 ng/mL (ref 0.3–4.0)
Relative Index: 1.7 (ref 0.0–2.5)
Relative Index: 2.3 (ref 0.0–2.5)
Troponin I: <0.3 ng/mL (ref <0.30)
Troponin I: <0.3 ng/mL (ref <0.30)

## 2012-01-15 LAB — CBC
Hemoglobin: 10 g/dL — ABNORMAL LOW (ref 12.0–15.0)
MCHC: 33.6 g/dL (ref 30.0–36.0)
Platelets: 227 10*3/uL (ref 150–400)
RBC: 3.14 MIL/uL — ABNORMAL LOW (ref 3.87–5.11)

## 2012-01-15 MED ORDER — BIOTENE DRY MOUTH MT LIQD
15.0000 mL | Freq: Two times a day (BID) | OROMUCOSAL | Status: DC
  Administered 2012-01-15 – 2012-01-17 (×5): 15 mL via OROMUCOSAL

## 2012-01-15 NOTE — Progress Notes (Signed)
OT Cancellation Note  Evaluation cancelled today due to pt down to CT earlier and now meeting with MD.  Will re-attempt eval tomorrow or later today if time permits.  Khalel Alms 01/15/2012, 3:43 PM

## 2012-01-15 NOTE — Progress Notes (Signed)
Utilization review completed.  

## 2012-01-15 NOTE — Progress Notes (Signed)
Subjective:    Interval Events:  [name] was better able to give the history of her fall today.  She said that yesterday afternoon, her husband had left her to take her son to the hospital.  She was sitting on the couch after just having walked to the kitchen, when she stood up, took her first step with her good leg (right) and then when she came down on her bad leg (left) she fell to the floor, striking her left knee.  She then crawled across her house to her bedroom.  She then tried to pull herself up on a door frame.  When she was trying to do this, she became lightheaded and felt like she was getting ready to pass out.  Then she passed out and her next memory was her husband at her side.  Overnight and this morning, she has not experienced any dizziness, lightheadedness, chest pain, or dyspnea.  She does have mild abdominal pain from where she crawled across the floor.  She has not had a BM here, but had one a day prior to admission.  Her main complaint is pain on the posterior aspect of the left thigh and the posterolateral aspect of the left knee.    Objective:    Vital Signs:   Temp:  [97.8 F (36.6 C)-98.8 F (37.1 C)] 97.8 F (36.6 C) (07/23 1345) Pulse Rate:  [79-84] 79  (07/23 1345) Resp:  [18-21] 20  (07/23 1345) BP: (113-160)/(57-81) 113/67 mmHg (07/23 1345) SpO2:  [93 %-100 %] 98 % (07/23 1345) FiO2 (%):  [2 %] 2 % (07/22 1719) Weight:  [128 lb 4.9 oz (58.2 kg)] 128 lb 4.9 oz (58.2 kg) (07/22 2002)    24-hour weight change: Weight change:   Filed Weights   01/14/12 1847 01/14/12 2002  Weight: 128 lb 4.9 oz (58.2 kg) 128 lb 4.9 oz (58.2 kg)    Intake/Output:   Intake/Output Summary (Last 24 hours) at 01/15/12 1609 Last data filed at 01/15/12 0541  Gross per 24 hour  Intake    240 ml  Output   1300 ml  Net  -1060 ml      Physical Exam: General appearance: alert, cooperative, no distress and tearful Throat: lips, mucosa, and tongue normal; teeth and  gums normal Resp: rhonchi throughout and wheezes bilaterally and end-expiratory Cardio: regular rate and rhythm, S1, S2 normal, no murmur, click, rub or gallop Extremities: pain with palpation of the posterolateral aspect of the left knee.  Echymosis on the posterior aspect of the left thigh.    Labs: Basic Metabolic Panel:  Lab 01/15/12 1610 01/14/12 0540  NA 145 136  K 3.8 4.3  CL 110 100  CO2 24 24  GLUCOSE 96 112*  BUN 34* 42*  CREATININE 1.41* 1.82*  CALCIUM 8.4 9.0  MG -- --  PHOS 3.1 --   Liver Function Tests:  Lab 01/15/12 0615  AST --  ALT --  ALKPHOS --  BILITOT --  PROT --  ALBUMIN 2.8*   CBC:  Lab 01/15/12 0615 01/14/12 0540  WBC 7.7 10.0  NEUTROABS -- 8.2*  HGB 10.0* 11.0*  HCT 29.8* 32.8*  MCV 94.9 93.7  PLT 227 264   Cardiac Enzymes:  Lab 01/15/12 0615 01/15/12 0004 01/14/12 1807 01/14/12 0540  CKTOTAL 106 111 140 126  CKMB 2.4 1.9 2.2 --  CKMBINDEX -- -- -- --  TROPONINI <0.30 <0.30 <0.30 --   Microbiology: Results for orders placed during the hospital encounter  of 01/14/12  URINE CULTURE     Status: Normal (Preliminary result)   Collection Time   01/14/12  8:12 AM      Component Value Range Status Comment   Specimen Description URINE, CATHETERIZED   Final    Special Requests ADDED 01/14/12 1428   Final    Culture  Setup Time 01/14/2012 14:42   Final    Colony Count >=100,000 COLONIES/ML   Final    Culture ESCHERICHIA COLI   Final    Report Status PENDING   Incomplete     Imaging: Ct Head Wo Contrast 01/15/2012 IMPRESSION: Chronic nonspecific white matter changes. No acute intracranial abnormality.    Medications:    Infusions:    . sodium chloride 75 mL/hr at 01/14/12 2209     Scheduled Medications:    . ALPRAZolam  0.5 mg Oral TID  . antiseptic oral rinse  15 mL Mouth Rinse BID  . aspirin  81 mg Oral Daily  . calcium-vitamin D  1 tablet Oral Daily  . carvedilol  25 mg Oral BID WC  . cefTRIAXone (ROCEPHIN)  IV  1 g  Intravenous Q24H  . doxepin  50 mg Oral QHS  . ferrous gluconate  325 mg Oral Q breakfast  . heparin  5,000 Units Subcutaneous Q8H  . hydrALAZINE  10 mg Oral TID  . multivitamin with minerals  1 tablet Oral Daily  . omega-3 acid ethyl esters  1 g Oral QID  . pantoprazole  40 mg Oral Q1200  . pyridOXINE  100 mg Oral Daily  . sodium bicarbonate  650 mg Oral BID  . sodium chloride  3 mL Intravenous Q12H  . vitamin C  500 mg Oral Daily  . DISCONTD: aspirin  81 mg Oral Daily  . DISCONTD: calcium citrate-vitamin D  1 tablet Oral Daily  . DISCONTD: fish oil-omega-3 fatty acids  1 g Oral QID  . DISCONTD: multivitamin  1 tablet Oral Daily     PRN Medications: HYDROcodone-acetaminophen, morphine injection, nitroGLYCERIN   Assessment/ Plan:    1.   Fall:  There are numerous etiologies in play here. The patient has had a problem with gait instability and falls recently, but there is question about LOC during this fall. The additional history gathered today was helpful in sorting this out.  Her fall was not precipitated by a LOC.  Rather, gait instability caused the fall.  Then, after crawling across the house, she passed out with a prodrome of lightheadedness. This makes orthostatic hypotension the most likely etiology for the LOC.  An acute illness stemming from a UTI (#2) could certainly exacerbate orthostatic hypotension or a worsening of gait. The presence of lightheadedness prior to LOC makes cardiac causes such as dysrhythmia less likely.  Her anginal symptoms seem stable; this has not been evaluated invasively because of renal insufficiency. A stroke would be an unlikely cause in this case and the patient has no history of seizure activity.  Polypharmacy is likely contributing to her gait instability.  Cardiac enzymes were negative.  Head CT was negative for acute intracranial processes. - OT to evaluate tomorrow - PT recommends SNF - Vitamin B-12 pending  2.   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 Urinalysis on admission was concerning for a UTI and BCx is pending.  UCx is growing > 100,000 E. coli. - Continue ceftriaxone daily  - BCx pending   3.  Anemia:  Chronically anemic with a baseline that hovers around 10. It was 11 on admission which likely represents a degree of hemoconcentration as this decreased to 10 with IV hydration overnight.  4.   CKD:  Baseline creatinine is around 1.4 to 1.5. It was 1.82 on admission with a BUN of 42. This likely represents AKI superimposed on CKD secondary to dehydration. It is most likely pre-renal from dehydration or possibly over diuresis. She was given Lasix 40mg  QD on 7/15 and told to take this until LE edema resolved and then on a PRN basis going forward.  After a night of IV hydration, creatinine decreased to 1.41 (baseline).  5.   Prophylaxis:  heparin   6.   Disposition:  Pending placement.   Length of Stay: 1 days   Signed by:  Dorthula Rue. Earlene Plater, MD PGY-I, Internal Medicine Pager [phone]  01/15/2012, 4:09 PM

## 2012-01-15 NOTE — ED Provider Notes (Signed)
Medical screening examination/treatment/procedure(s) were performed by non-physician practitioner and as supervising physician I was immediately available for consultation/collaboration.  Cheri Guppy, MD 01/15/12 [phone]

## 2012-01-15 NOTE — H&P (Signed)
Internal Medicine Teaching Service Attending Note Date: 01/15/2012  Patient name: [name]  Medical record number: [id number]  Date of birth: 07-29-1940   I have seen and evaluated [name] and discussed their care with the Residency Team. Please see [name] H&P for full details. It has been 1.5 yrs since [name] has been able to walk without feer of falling. She had a hip fx Jan 2012 and had inpt rehab for about three 3 months. Reading [name]'s office visits, it seems as if her physical function fluctuates. Seems to be declining most recently. She uses a cane and was a WC but it is too wide to fit in her hallways so she doesn't use it. Pt has had falls recently which lead to her admission yesterday. Currently her pain is well controlled although she is sore all over. She states that after her bladder and kidney surgery, her freq of UTI's declined sharply. She states her GI doctor (who seems to be functioning as PCP since she states she cannot get any primary MD to take her on since she is so complicated) has been slowly decreasing her Xanax. She uses it to prevent her from getting nervous. She has been trying to gain weight and reached her goal weight of 120 lbs.   PMHx, meds, allergies, soc hx, and fam hx reviewed   Filed Vitals:   01/14/12 1847 01/14/12 2002 01/15/12 0536 01/15/12 0900  BP:  143/66 160/74 142/81  Pulse:  79 79 84  Temp:  98.5 F (36.9 C) 98 F (36.7 C) 98.3 F (36.8 C)  TempSrc:  Oral Oral Oral  Resp:  20 20 20   Height: 5\' 5"  (1.651 m) 5\' 5"  (1.651 m)    Weight: 128 lb 4.9 oz (58.2 kg) 128 lb 4.9 oz (58.2 kg)    SpO2:  93% 95% 96%   GEN : lying in bed, appears younger than 62 but appears frail and chronically ill HRRR no MRG Pulsation of carotid visible on R No carotid bruits ABD + BS, s/nt/nd Skin two skin graft sites on LE B Neuro : Unable to lift LE off bed. Ankle flexion and extension 3/5 B. This is in stark contrast to her PE  day prior. ? Effort ?  Labs and imaging reviewed  Assessment and Plan: I agree with the formulated Assessment and Plan with the following changes:   [name] was admitted for freq falls. The cause of her falls is likely multifactorial. We are investigating for reversible causes.  1. Global weakness - electrolytes are OK. No overwhelming infection. Pre-test prob of CVA low but we are getting a CT head which will also eval for CVA. 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 And CT will eval for NPH - gait abnl, husband describes some apathy, and was suprapubic catheter so cannot assess bladder dysfxn. Spinal cord compression unlikely as no pain and no bowel dysfxn so MRI spine not indicated. PT/OT consult will be key. Check B12 - was borderline 06/2010  2. UTI - Pt had abnl UA so will treat with ABX. Await cx results  3. CKD - Pt sees [name]. Creatinine is at baseline.   4. Freq CP - has been seen by [name] in office and felt that cardiac cath was not indicated as her kidney fxn is chronically impaired. She was tx medically. She has had three negative CE.  5. Anemia - Melody  Caryn [name] has the pt on iron. Outpt mgmt.   6. Dispo - will depend on PT/OT rec's. May need SNF.   Melody Spain, MD 7/23/201311:34 AM

## 2012-01-15 NOTE — Evaluation (Signed)
Physical Therapy Evaluation Patient Details Name: [name] MRN: [id number] DOB: [birth year] Today's Date: 01/15/2012 Time: 4098-1191 PT Time Calculation (min): 31 min  PT Assessment / Plan / Recommendation Clinical Impression  Pt. was admitted due to a fall, generalized weakness,UTI, soreness from fall and with a history of CKD and L hip fx and bipolar prosthesis.  She presents with significant pain which is greatly limiting her functional mobility and gait presently.  At her current functional level, do not believe she can return home.  Unsure if she could participate in 3 hrs./day of therapies at CIR level.  Would likely do best at [facility] SNF level for rehab prior to DC home.  Incidentally, note her xrays of L hip and knee showed no acute findings, but she is having a great deal of pain with movement of L LE and with attmepts at weightbearing.     PT Assessment  Patient needs continued PT services    Follow Up Recommendations  Skilled nursing facility;Supervision/Assistance - 24 hour;Supervision for mobility/OOB    Barriers to Discharge Decreased caregiver support      Equipment Recommendations  Defer to next venue    Recommendations for Other Services     Frequency Min 3X/week    Precautions / Restrictions Precautions Precautions: Fall Restrictions Weight Bearing Restrictions: No Other Position/Activity Restrictions: movement of left LE limited by pain   Pertinent Vitals/Pain Pain left leg at 7/10 level, RN aware, pt. Repositioned.      Mobility  Bed Mobility Bed Mobility: Supine to Sit;Sit to Supine;Sitting - Scoot to Edge of Bed Supine to Sit: 1: +2 Total assist;HOB elevated;With rails Supine to Sit: Patient Percentage: 40% Sitting - Scoot to Edge of Bed: 3: Mod assist Sit to Supine: Not Tested (comment) Details for Bed Mobility Assistance: Pt. could initiate movement toward EOB with right LE but needed facilitation to move L LE and especially at shoulders to move to  sitting position Transfers Transfers: Sit to Stand;Stand to Sit;Stand Pivot Transfers Sit to Stand: 1: +2 Total assist;From bed;With upper extremity assist Sit to Stand: Patient Percentage: 40% Stand to Sit: 1: +2 Total assist;With upper extremity assist;To chair/3-in-1 Stand to Sit: Patient Percentage: 40% Stand Pivot Transfers: 1: +2 Total assist Stand Pivot Transfers: Patient Percentage: 40% Details for Transfer Assistance: Pt. neeeded reinforcement of hand placement, assist to rise to stand.  Unable to put full weight on L LE and kept knee flexed and weight on forefoot.Needed max facilitation for pivot transfer. Ambulation/Gait Ambulation/Gait Assistance: Not tested (comment);Other (comment) (not able to tolerate) Assistive device: Rolling walker Stairs: No Wheelchair Mobility Wheelchair Mobility: No    Exercises     PT Diagnosis: Difficulty walking;Acute pain;Generalized weakness  PT Problem List: Decreased strength;Decreased activity tolerance;Decreased balance;Decreased mobility;Pain;Decreased knowledge of use of DME PT Treatment Interventions: DME instruction;Gait training;Functional mobility training;Therapeutic activities;Therapeutic exercise;Balance training;Patient/family education   PT Goals Acute Rehab PT Goals PT Goal Formulation: With patient Time For Goal Achievement: 01/29/12 Potential to Achieve Goals: Good Pt will go Supine/Side to Sit: with min assist PT Goal: Supine/Side to Sit - Progress: Goal set today Pt will go Sit to Supine/Side: with min assist PT Goal: Sit to Supine/Side - Progress: Goal set today Pt will go Sit to Stand: with min assist PT Goal: Sit to Stand - Progress: Goal set today Pt will go Stand to Sit: with min assist PT Goal: Stand to Sit - Progress: Goal set today Pt will Transfer Bed to Chair/Chair to Bed: with min assist  PT Transfer Goal: Bed to Chair/Chair to Bed - Progress: Goal set today Pt will Ambulate: 16 - 50 feet;with min  assist;with rolling walker PT Goal: Ambulate - Progress: Goal set today  Visit Information  Last PT Received On: 01/15/12 Assistance Needed: +2    Subjective Data  Subjective: I fell at home.  Is not sure what caused her to fall. Reports episodes of her thinking not being clear, and being forgetful. Patient Stated Goal: Get well enough to get back home.   Prior Functioning  Home Living Lives With: Spouse;Other (Comment) (spouse has "back trouble") Available Help at Discharge: Family;Available 24 hours/day Type of Home: House Home Access: Stairs to enter [employer] of Steps: 1 Entrance Stairs-Rails: Right;Left;Can reach both Home Layout: Two level;Other (Comment) (top level is not used, is closed off) Bathroom Shower/Tub: Tub/shower unit;Door [employer] Toilet: Standard Home Adaptive Equipment: Walker - rolling;Shower chair with back Prior Function Level of Independence: Independent Able to Take Stairs?: Yes Driving: No Vocation: Retired Musician: No difficulties Dominant Hand: Right    Cognition  Overall Cognitive Status: Appears within functional limits for tasks assessed/performed Arousal/Alertness: Awake/alert Orientation Level: Oriented X4 / Intact;Other (comment) (with increased time) Behavior During Session: [facility] for tasks performed Cognition - Other Comments: needed increased time and effort for processing of some questions and providing answers    Extremity/Trunk Assessment Right Upper Extremity Assessment RUE ROM/Strength/Tone: [facility] for tasks assessed Left Upper Extremity Assessment LUE ROM/Strength/Tone: WFL for tasks assessed Right Lower Extremity Assessment RLE ROM/Strength/Tone: WFL for tasks assessed RLE Sensation: WFL - Light Touch RLE Coordination: WFL - gross/fine motor Left Lower Extremity Assessment LLE ROM/Strength/Tone: Unable to fully assess;Due to pain (pain limiting L LE testing; frequently wincing in pain) LLE  Sensation: WFL - Light Touch Trunk Assessment Trunk Assessment: Normal   Balance Balance Balance Assessed: Yes Static Sitting Balance Static Sitting - Balance Support: Bilateral upper extremity supported;Feet supported Static Sitting - Level of Assistance: 4: Min assist Static Sitting - Comment/# of Minutes: 3 Static Standing Balance Static Standing - Balance Support: During functional activity;Bilateral upper extremity supported Static Standing - Level of Assistance: 1: +2 Total assist;Patient percentage (comment);Other (comment) (pt. 40%)  End of Session PT - End of Session Equipment Utilized During Treatment: Gait belt Activity Tolerance: Patient limited by pain Patient left: in chair;with call bell/phone within reach Nurse Communication: Mobility status  GP     Ferman Hamming 01/15/2012, 2:40 PM Acute Rehabilitation Services [phone] (pager)

## 2012-01-16 ENCOUNTER — Inpatient Hospital Stay (HOSPITAL_COMMUNITY): Payer: Medicare Other

## 2012-01-16 LAB — CARDIAC PANEL(CRET KIN+CKTOT+MB+TROPI)
CK, MB: 2.7 ng/mL (ref 0.3–4.0)
Troponin I: <0.3 ng/mL (ref <0.30)

## 2012-01-16 LAB — VITAMIN B12: Vitamin B-12: 335 pg/mL (ref 211–911)

## 2012-01-16 NOTE — Progress Notes (Signed)
Subjective:    Interval Events:  [name] had an episode of coughing followed by chest pain over night.  EKG was normal, CXR was unremarkable, and CE x1 was negative.  She is otherwise doing well.  The pain around here knee and thigh is much better, but she still is complaining of some pain in her left foot and ankle, but this too is getting better.  She has not had any other chest pain episodes, is not dyspneic, and has only mild, chronic abdominal pain.  She has moved about the room without dizziness or lightheadedness.  She also denies fevers and chills.    Objective:    Vital Signs:   Temp:  [97.3 F (36.3 C)-99.2 F (37.3 C)] 98.7 F (37.1 C) (07/24 0926) Pulse Rate:  [74-84] 75  (07/24 0926) Resp:  [20] 20  (07/24 0926) BP: (109-155)/(63-80) 148/80 mmHg (07/24 0926) SpO2:  [89 %-97 %] 96 % (07/24 0926) Weight:  [128 lb 4.9 oz (58.2 kg)] 128 lb 4.9 oz (58.2 kg) (07/23 2055)     24-hour weight change: Weight change: 0 lb (0 kg)  Filed Weights   01/14/12 1847 01/14/12 2002 01/15/12 2055  Weight: 128 lb 4.9 oz (58.2 kg) 128 lb 4.9 oz (58.2 kg) 128 lb 4.9 oz (58.2 kg)    Intake/Output:   Intake/Output Summary (Last 24 hours) at 01/16/12 1351 Last data filed at 01/16/12 0830  Gross per 24 hour  Intake   1440 ml  Output   2275 ml  Net   -835 ml       Physical Exam: General appearance: alert, cooperative, no distress and tearful  Resp: rhonchi throughout and wheezes bilaterally and end-expiratory  Cardio: regular rate and rhythm, S1, S2 normal, no murmur, click, rub or gallop  GI:  Soft, mild tenderness with palpation in all quadrants, no guarding or rebound, no masses or organomegaly. Extremities: pain with palpation of the posterolateral aspect of the left knee. Echymosis on the posterior aspect of the left thigh.   Labs: Cardiac Enzymes:  Lab 01/16/12 0515 01/15/12 0615 01/15/12 0004 01/14/12 1807 01/14/12 0540  CKTOTAL 93 106 111 140 126  CKMB 2.7 2.4  1.9 2.2 --  CKMBINDEX -- -- -- -- --  TROPONINI <0.30 <0.30 <0.30 <0.30 --   Microbiology: Results for orders placed during the hospital encounter of 01/14/12  URINE CULTURE     Status: Normal (Preliminary result)   Collection Time   01/14/12  8:12 AM      Component Value Range Status Comment   Specimen Description URINE, CATHETERIZED   Final    Special Requests ADDED 01/14/12 1428   Final    Culture  Setup Time 01/14/2012 14:42   Final    Colony Count >=100,000 COLONIES/ML   Final    Culture ESCHERICHIA COLI   Final    Report Status PENDING   Incomplete    Organism ID, Bacteria ESCHERICHIA COLI   Final   CULTURE, BLOOD (ROUTINE X 2)     Status: Normal (Preliminary result)   Collection Time   01/14/12  8:30 PM      Component Value Range Status Comment   Specimen Description BLOOD RIGHT HAND   Final    Special Requests BOTTLES DRAWN AEROBIC AND ANAEROBIC 10CC   Final    Culture  Setup Time 01/15/2012 01:29   Final    Culture     Final    Value:        BLOOD  CULTURE RECEIVED NO GROWTH TO DATE CULTURE WILL BE HELD FOR 5 DAYS BEFORE ISSUING A FINAL NEGATIVE REPORT   Report Status PENDING   Incomplete   CULTURE, BLOOD (ROUTINE X 2)     Status: Normal (Preliminary result)   Collection Time   01/14/12  8:40 PM      Component Value Range Status Comment   Specimen Description BLOOD LEFT HAND   Final    Special Requests BOTTLES DRAWN AEROBIC ONLY 8CC   Final    Culture  Setup Time 01/15/2012 01:29   Final    Culture     Final    Value:        BLOOD CULTURE RECEIVED NO GROWTH TO DATE CULTURE WILL BE HELD FOR 5 DAYS BEFORE ISSUING A FINAL NEGATIVE REPORT   Report Status PENDING   Incomplete     Other results: EKG: normal EKG, normal sinus rhythm, unchanged from previous tracings.   Imaging: Dg Chest 2 View 01/16/2012 IMPRESSION: Mild vascular congestion.  Low lung volumes.  Borderline cardiac enlargement. No active infiltrates.    Medications:    Scheduled Medications:    .  ALPRAZolam  0.5 mg Oral TID  . antiseptic oral rinse  15 mL Mouth Rinse BID  . aspirin  81 mg Oral Daily  . calcium-vitamin D  1 tablet Oral Daily  . carvedilol  25 mg Oral BID WC  . cefTRIAXone (ROCEPHIN)  IV  1 g Intravenous Q24H  . doxepin  50 mg Oral QHS  . ferrous gluconate  325 mg Oral Q breakfast  . heparin  5,000 Units Subcutaneous Q8H  . hydrALAZINE  10 mg Oral TID  . multivitamin with minerals  1 tablet Oral Daily  . omega-3 acid ethyl esters  1 g Oral QID  . pantoprazole  40 mg Oral Q1200  . pyridOXINE  100 mg Oral Daily  . sodium bicarbonate  650 mg Oral BID  . sodium chloride  3 mL Intravenous Q12H  . vitamin C  500 mg Oral Daily     PRN Medications: HYDROcodone-acetaminophen, morphine injection, nitroGLYCERIN   Assessment/ Plan:    1.   Fall:  There are numerous etiologies in play here. The patient has had a problem with gait instability and falls recently, but there is question about LOC during this fall. The additional history gathered today was helpful in sorting this out. Her fall was not precipitated by a LOC. Rather, gait instability caused the fall. Then, after crawling across the house, she passed out with a prodrome of lightheadedness. This makes orthostatic hypotension the most likely etiology for the LOC. An acute illness stemming from a UTI (#2) could certainly exacerbate orthostatic hypotension or a worsening of gait. The presence of lightheadedness prior to LOC makes cardiac causes such as dysrhythmia less likely. Her anginal symptoms seem stable; this has not been evaluated invasively because of renal insufficiency. A stroke would be an unlikely cause in this case and the patient has no history of seizure activity. Polypharmacy is likely contributing to her gait instability. Cardiac enzymes were negative. Head CT was negative for acute intracranial processes.  Vitamin B12 was normal.  PT and OT recommend SNF.  CSW is submitting bed requests.  2.   UTI:   Patient's bladder was surgically removed in 1974. 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 Urinalysis on admission was concerning for a UTI and BCx is pending. UCx is growing > 100,000 E. coli.  This  is resistant to Bactrim, ciprofloxacin, ampicillin, levofloxacin, and is intermediate with cefazolin. - Continue ceftriaxone for now (day 3).  Will finish a 7 day course. - BCx pending   3.   Anemia:  Stable.  Chronically anemic with a baseline that hovers around 10. It was 11 on admission which likely represents a degree of hemoconcentration as this decreased to 10 with IV hydration overnight.   4.   CKD:  Stable.  Baseline creatinine is around 1.4 to 1.5. It was 1.82 on admission with a BUN of 42. This likely represents AKI superimposed on CKD secondary to dehydration. It is most likely pre-renal from dehydration or possibly over diuresis. She was given Lasix 40mg  QD on 7/15 and told to take this until LE edema resolved and then on a PRN basis going forward. After a night of IV hydration, creatinine decreased to 1.41 (baseline).   5. Prophylaxis: heparin   6. Disposition: Pending placement.    Length of Stay: 2 days   Signed by:  Dorthula Rue. Earlene Plater, MD PGY-I, Internal Medicine Pager [phone]  01/16/2012, 1:51 PM

## 2012-01-16 NOTE — Progress Notes (Signed)
Internal Medicine Teaching Service Attending Note Date: 01/16/2012  Patient name: [name]  Medical record number: [id number]  Date of birth: 08/30/1940    This patient has been seen and discussed with the house staff. Please see their note for complete details. I concur with their findings with the following additions/corrections: Pt is sitting in recliner and looks much better and states that she is feeling better and less sore. Working with PT and OT and is agreeable to SNF.   Masiya Claassen 01/16/2012, 11:54 AM

## 2012-01-16 NOTE — Clinical Social Work Placement (Addendum)
    Clinical Social Work Department CLINICAL SOCIAL WORK PLACEMENT NOTE 01/16/2012  Patient:  [name], [name]  Account Number:  [id number] Admit date:  01/14/2012  Clinical Social Worker:  Margaree Mackintosh  Date/time:  01/16/2012 03:50 PM  Clinical Social Work is seeking post-discharge placement for this patient at the following level of care:   SKILLED NURSING   (*CSW will update this form in Epic as items are completed)   01/16/2012  Patient/family provided with [facility] Department of Clinical Social Work's list of facilities offering this level of care within the geographic area requested by the patient (or if unable, by the patient's family).  01/16/2012  Patient/family informed of their freedom to choose among providers that offer the needed level of care, that participate in Medicare, Medicaid or managed care program needed by the patient, have an available bed and are willing to accept the patient.  01/16/2012  Patient/family informed of [facility]' ownership interest in [facility], as well as of the fact that they are under no obligation to receive care at this facility.  PASARR submitted to EDS on 01/16/2012 PASARR number received from [employer]DS on   FL2 transmitted to all facilities in geographic area requested by pt/family on  01/16/2012 FL2 transmitted to all facilities within larger geographic area on   Patient informed that his/her managed care company has contracts with or will negotiate with  certain facilities, including the following:     Patient/family informed of bed offers received:  01/17/12 Patient chooses bed at:  [facility] Physician recommends and patient chooses bed at    Patient to be transferred to  [facility]  on  01/17/12 Patient to be transferred to facility by [facility]  The following physician request were entered in Epic:   Additional Comments:  CSW contacted pt dtr per pt request and will notify dtr when  transport to SNF is arranged-MD aware of bed offer and CSW has confirmed bed with SNF today-  Jodean Lima, [phone]

## 2012-01-16 NOTE — Progress Notes (Signed)
Pt ambulated from bed to doorway with walker and 2 person assist. Oxygen saturation on room air 97% sitting on side of bed. Oxygen saturation on room air 97% during ambulating from bed to doorway. Pt unable to farther due to weakness and pain in left leg. [name], Melody Randy

## 2012-01-16 NOTE — Progress Notes (Signed)
Was called that pt was having L sided CP that felt similar to a heart attack she had in the past. Nursing staff said pt was refusing EKG and vital checks. Went to evaluate pt. When arrived pt was laying comfortably in bed. Stated that pain was sharp in nature and lasted couple minutes usually after a cough, does not radiate anywhere, and has been constant throughout the whole day today. She thinks it may correlate to some back pain that is also on her left side after the fall she had prior to admission. She denied any SOB, HA, or dizziness. She does feel warm and has had some sweats today. Pt states has had cough for at least a month and main concern was that she hasn't slept today.  Filed Vitals:   01/16/12 0351  BP: 155/80  Pulse: 82  Temp: 98.3 F (36.8 C)  Resp: 20   PE: pt NAD, not diaphoretic, cardiac RRR, normal S1/S2, no S3/S4, murmurs, or gallops, resp CTAB, frequent non-productive cough, no LE edema, L sided pain reproducible on palpation  Stat EKG- NSR w/no acute changes compared to previous, ordered CE x1 stat and CXR for the morning to r/o rib fractures/infection, K-pads ordered for MS pain relief of chest.   Informed pt of the plan and was she was amenable to comply with current investigation of CP.  Informed nursing of plan and that if pts pain increased or vitals changed to not hesitate to call.   Thank you, Christen Bame, MD PGY-1 [phone]

## 2012-01-16 NOTE — Progress Notes (Signed)
Pt requesting pain medication. Pt states has Left flank pain, Left rib pain, and some pain in her chest that has been occuring all day. Pt states it does not radiate, is constant and unchanged with movement. I advised patent we would get and EKG and look into the chest pain and I would notify the MD but Patient refuses to allow an EKG pt states all she wants is to sleep and does not want to be disturbed. Telemetry remains unchanged  In NSR, rate 79. Notified MD MD advised she will come to bedside

## 2012-01-16 NOTE — Progress Notes (Signed)
Physical Therapy Treatment Patient Details Name: [name] MRN: [id number] DOB: 1941-01-03 Today's Date: 01/16/2012 Time: 7253-6644 PT Time Calculation (min): 28 min  PT Assessment / Plan / Recommendation Comments on Treatment Session  Pt. was able to tolerate more activity today, though still very high risk of fall.  Needs ST SNF for rehab    Follow Up Recommendations  Skilled nursing facility;Supervision/Assistance - 24 hour;Supervision for mobility/OOB    Barriers to Discharge        Equipment Recommendations  Defer to next venue    Recommendations for Other Services    Frequency Min 3X/week   Plan Discharge plan remains appropriate    Precautions / Restrictions Precautions Precautions: Fall Restrictions Weight Bearing Restrictions: No Other Position/Activity Restrictions: movement of left LE limited by pain   Pertinent Vitals/Pain Pain in left LE 7/10 especially with movement and gait.  RN aware and pt. Positioned for comfort and increased her ambulation.    Mobility  Bed Mobility Bed Mobility: Supine to Sit;Sitting - Scoot to Edge of Bed Supine to Sit: 4: Min assist;With rails;HOB elevated Supine to Sit: Patient Percentage: 70% Sitting - Scoot to Edge of Bed: 4: Min assist Sit to Supine: Not Tested (comment) Details for Bed Mobility Assistance: moved to EOB with primarily min assist for moving left LE today.  Did more of task under her own power today, thougs still guarded and with pain. Transfers Transfers: Sit to Stand;Stand to Sit Sit to Stand: 4: Min assist;From bed;With upper extremity assist Sit to Stand: Patient Percentage: 60% Stand to Sit: 4: Min assist;With upper extremity assist;To chair/3-in-1 Stand to Sit: Patient Percentage: 60% Details for Transfer Assistance: Pt. needed frequent safety and hand pacement cues and stabilizing assist , tending to lean forward and with heels off floor Ambulation/Gait Ambulation/Gait Assistance: 1: +2 Total  assist Ambulation/Gait: Patient Percentage: 60% Ambulation Distance (Feet): 16 Feet Assistive device: Rolling walker Ambulation/Gait Assistance Details: Pt. initially very unstable and anterior leaning.  Needing max cueing for sequencing gait and for safety and technique.  Unsafe in ambulation and at risk of fall. Gait Pattern: Step-to pattern;Narrow base of support Stairs: No Wheelchair Mobility Wheelchair Mobility: No    Exercises     PT Diagnosis:    PT Problem List:   PT Treatment Interventions:     PT Goals Acute Rehab PT Goals PT Goal: Supine/Side to Sit - Progress: Progressing toward goal PT Goal: Sit to Stand - Progress: Progressing toward goal PT Goal: Stand to Sit - Progress: Progressing toward goal PT Goal: Ambulate - Progress: Progressing toward goal  Visit Information  Last PT Received On: 01/16/12 Assistance Needed: +2    Subjective Data  Subjective: I feel a little better today.  I don't know where my purse is.   Cognition  Overall Cognitive Status: Appears within functional limits for tasks assessed/performed Arousal/Alertness: Awake/alert Orientation Level: Oriented X4 / Intact;Other (comment) Behavior During Session: [facility] for tasks performed Cognition - Other Comments: processing a little better today, less time needed compared to yesterday    Balance  Static Standing Balance Static Standing - Balance Support: Bilateral upper extremity supported;During functional activity Static Standing - Level of Assistance: 4: Min assist  End of Session PT - End of Session Equipment Utilized During Treatment: Gait belt Activity Tolerance: Patient limited by pain Patient left: in chair;with call bell/phone within reach Nurse Communication: Mobility status   GP     Ferman Hamming 01/16/2012, 9:43 AM Acute Rehabilitation Services [phone] (pager)

## 2012-01-16 NOTE — Progress Notes (Signed)
Clinical Social Work Department BRIEF PSYCHOSOCIAL ASSESSMENT 01/16/2012  Patient:  [name], [name]     Account Number:  [id number]     Admit date:  01/14/2012  Clinical Social Worker:  Conley Simmonds  Date/Time:  01/16/2012 10:00 AM  Referred by:  Physician  Date Referred:  01/16/2012 Referred for  SNF Placement   Other Referral:   Interview type:  Patient Other interview type:    PSYCHOSOCIAL DATA Living Status:  HUSBAND Admitted from facility:   Level of care:   Primary support name:  [name] Primary support relationship to patient:  SPOUSE Degree of support available:   Strong-  Pt dtr-Tammy Evans is a primary support for both patient and mother    CURRENT CONCERNS Current Concerns  Post-Acute Placement   Other Concerns:    SOCIAL WORK ASSESSMENT / PLAN CSW met with pt at bedside to discuss d/c planning-pt alert and orientedx3-  Pt has been to SNF for rehab in the past and is familiar with placement process.  CSW will be initiating FL2/bed search and will contact pt daughter (who is a SW) per pt request with SNF options   Assessment/plan status:  Psychosocial Support/Ongoing Assessment of Needs Other assessment/ plan:   Information/referral to community resources:    PATIENT'S/FAMILY'S RESPONSE TO PLAN OF CARE: Pt very pleasant and although not excited about SNF she is agreeable and comfortable with d/c plan (SNF).  Pt very appreciative of CSW visit and spent a great deal of time sharing stories of her daughter who is a [employer] C[employer] Social worker and her grandson who has recently joined [employer].  CSW will follow up with bed offers and contact pt dtr per pt request--  Jodean Lima, [phone]

## 2012-01-17 LAB — BASIC METABOLIC PANEL
BUN: 25 mg/dL — ABNORMAL HIGH (ref 6–23)
CO2: 22 mEq/L (ref 19–32)
Calcium: 8.8 mg/dL (ref 8.4–10.5)
Creatinine, Ser: 1.37 mg/dL — ABNORMAL HIGH (ref 0.50–1.10)
GFR calc non Af Amer: 38 mL/min — ABNORMAL LOW (ref >90)
Glucose, Bld: 88 mg/dL (ref 70–99)
Potassium: 4 mEq/L (ref 3.5–5.1)

## 2012-01-17 MED ORDER — CEFPODOXIME PROXETIL 200 MG PO TABS
200.0000 mg | ORAL_TABLET | Freq: Once | ORAL | Status: AC
  Administered 2012-01-17: 200 mg via ORAL
  Filled 2012-01-17: qty 1

## 2012-01-17 MED ORDER — CEFPODOXIME PROXETIL 200 MG PO TABS
200.0000 mg | ORAL_TABLET | Freq: Once | ORAL | Status: DC
  Filled 2012-01-17: qty 1

## 2012-01-17 MED ORDER — NITROFURANTOIN MONOHYD MACRO 100 MG PO CAPS: 100.0000 mg | ORAL_CAPSULE | Freq: Two times a day (BID) | ORAL | Status: DC

## 2012-01-17 MED ORDER — HYDROCODONE-ACETAMINOPHEN 5-325 MG PO TABS: 1.0000 | ORAL_TABLET | ORAL | Status: DC | PRN

## 2012-01-17 MED ORDER — CEFPODOXIME PROXETIL 200 MG PO TABS
200.0000 mg | ORAL_TABLET | Freq: Two times a day (BID) | ORAL | Status: DC
  Filled 2012-01-17 (×2): qty 1

## 2012-01-17 MED ORDER — CEFPODOXIME PROXETIL 200 MG PO TABS: 200.0000 mg | ORAL_TABLET | Freq: Two times a day (BID) | ORAL | Status: AC

## 2012-01-17 NOTE — Progress Notes (Signed)
Subjective:    Interval Events:  [name] has had no more episodes of chest pain.  She denies headaches and dyspnea.  She does have some mild dizziness upon standing, but this is normal for her.  She also endorses mild abdominal, which is also a chronic, and unchanged problem.  The pain in her left leg, ankle and foot persist, but it is improving.  She is ready to be discharged to SNF.    Objective:    Vital Signs:   Temp:  [97.5 F (36.4 C)-98.3 F (36.8 C)] 98.3 F (36.8 C) (07/25 0528) Pulse Rate:  [71-81] 71  (07/25 0528) Resp:  [20] 20  (07/25 0528) BP: (128-152)/(68-83) 151/68 mmHg (07/25 0528) SpO2:  [97 %-98 %] 97 % (07/25 0528) Weight:  [109 lb 5.6 oz (49.6 kg)] 109 lb 5.6 oz (49.6 kg) (07/24 2101)     24-hour weight change: Weight change: -18 lb 15.4 oz (-8.6 kg)  Filed Weights   01/14/12 2002 01/15/12 2055 01/16/12 2101  Weight: 128 lb 4.9 oz (58.2 kg) 128 lb 4.9 oz (58.2 kg) 109 lb 5.6 oz (49.6 kg)    Intake/Output:   Intake/Output Summary (Last 24 hours) at 01/17/12 1019 Last data filed at 01/17/12 0532  Gross per 24 hour  Intake    180 ml  Output   2850 ml  Net  -2670 ml      Physical Exam: General appearance: alert, cooperative, no distress and tearful  Resp: rhonchi throughout and wheezes bilaterally and end-expiratory  Cardio: regular rate and rhythm, S1, S2 normal, no murmur, click, rub or gallop  GI: Soft, mild tenderness with palpation in all quadrants, no guarding or rebound, no masses or organomegaly.  Extremities: pain with palpation of the posterolateral aspect of the left knee. Echymosis on the posterior aspect of the left thigh.   Labs: Basic Metabolic Panel:  Lab 01/17/12 4098 01/15/12 0615 01/14/12 0540  NA 139 145 136  K 4.0 3.8 4.3  CL 106 110 100  CO2 22 24 24   GLUCOSE 88 96 112*  BUN 25* 34* 42*  CREATININE 1.37* 1.41* 1.82*  CALCIUM 8.8 8.4 9.0  MG -- -- --  PHOS -- 3.1 --   Microbiology: Results for orders  placed during the hospital encounter of 01/14/12  URINE CULTURE     Status: Normal (Preliminary result)   Collection Time   01/14/12  8:12 AM      Component Value Range Status Comment   Specimen Description URINE, CATHETERIZED   Final    Special Requests ADDED 01/14/12 1428   Final    Culture  Setup Time 01/14/2012 14:42   Final    Colony Count >=100,000 COLONIES/ML   Final    Culture     Final    Value: ESCHERICHIA COLI     GRAM NEGATIVE RODS   Report Status PENDING   Incomplete    Organism ID, Bacteria ESCHERICHIA COLI   Final   CULTURE, BLOOD (ROUTINE X 2)     Status: Normal (Preliminary result)   Collection Time   01/14/12  8:30 PM      Component Value Range Status Comment   Specimen Description BLOOD RIGHT HAND   Final    Special Requests BOTTLES DRAWN AEROBIC AND ANAEROBIC 10CC   Final    Culture  Setup Time 01/15/2012 01:29   Final    Culture     Final    Value:  BLOOD CULTURE RECEIVED NO GROWTH TO DATE CULTURE WILL BE HELD FOR 5 DAYS BEFORE ISSUING A FINAL NEGATIVE REPORT   Report Status PENDING   Incomplete   CULTURE, BLOOD (ROUTINE X 2)     Status: Normal (Preliminary result)   Collection Time   01/14/12  8:40 PM      Component Value Range Status Comment   Specimen Description BLOOD LEFT HAND   Final    Special Requests BOTTLES DRAWN AEROBIC ONLY 8CC   Final    Culture  Setup Time 01/15/2012 01:29   Final    Culture     Final    Value:        BLOOD CULTURE RECEIVED NO GROWTH TO DATE CULTURE WILL BE HELD FOR 5 DAYS BEFORE ISSUING A FINAL NEGATIVE REPORT   Report Status PENDING   Incomplete      Medications:    Scheduled Medications:    . ALPRAZolam  0.5 mg Oral TID  . antiseptic oral rinse  15 mL Mouth Rinse BID  . aspirin  81 mg Oral Daily  . calcium-vitamin D  1 tablet Oral Daily  . carvedilol  25 mg Oral BID WC  . cefpodoxime  200 mg Oral Q12H  . cefpodoxime  200 mg Oral Once  . doxepin  50 mg Oral QHS  . ferrous gluconate  325 mg Oral Q breakfast  .  heparin  5,000 Units Subcutaneous Q8H  . hydrALAZINE  10 mg Oral TID  . multivitamin with minerals  1 tablet Oral Daily  . omega-3 acid ethyl esters  1 g Oral QID  . pantoprazole  40 mg Oral Q1200  . pyridOXINE  100 mg Oral Daily  . sodium bicarbonate  650 mg Oral BID  . sodium chloride  3 mL Intravenous Q12H  . vitamin C  500 mg Oral Daily  . DISCONTD: cefTRIAXone (ROCEPHIN)  IV  1 g Intravenous Q24H  . DISCONTD: nitrofurantoin (macrocrystal-monohydrate)  100 mg Oral Q12H    PRN Medications: HYDROcodone-acetaminophen, morphine injection, nitroGLYCERIN   Assessment/ Plan:    1. Fall:  There are numerous etiologies in play here. The patient has had a problem with gait instability and falls recently, but there is question about LOC during this fall. The additional history gathered today was helpful in sorting this out. Her fall was not precipitated by a LOC. Rather, gait instability caused the fall. Then, after crawling across the house, she passed out with a prodrome of lightheadedness. This makes orthostatic hypotension the most likely etiology for the LOC. An acute illness stemming from a UTI (#2) could certainly exacerbate orthostatic hypotension or a worsening of gait. The presence of lightheadedness prior to LOC makes cardiac causes such as dysrhythmia less likely. Her anginal symptoms seem stable; this has not been evaluated invasively because of renal insufficiency. A stroke would be an unlikely cause in this case and the patient has no history of seizure activity. Polypharmacy is likely contributing to her gait instability. Cardiac enzymes were negative. Head CT was negative for acute intracranial processes. Vitamin B12 was normal. PT and OT recommend SNF. CSW is submitting bed requests.   2. 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 Urinalysis on admission was concerning for a UTI and BCx is pending. UCx is growing  > 100,000 E. coli. This is resistant to Bactrim, ciprofloxacin, ampicillin, levofloxacin, and is intermediate with cefazolin.  BCx are NGTD. - Switched  from ceftriaxone to cefpodoxime oral - BCx NGTD  3. Anemia: Stable. Chronically anemic with a baseline that hovers around 10. It was 11 on admission which likely represents a degree of hemoconcentration as this decreased to 10 with IV hydration overnight.   4. CKD: Stable. Baseline creatinine is around 1.4 to 1.5. It was 1.82 on admission with a BUN of 42. This likely represents AKI superimposed on CKD secondary to dehydration. It is most likely pre-renal from dehydration or possibly over diuresis. She was given Lasix 40mg  QD on 7/15 and told to take this until LE edema resolved and then on a PRN basis going forward. After a night of IV hydration, creatinine decreased to 1.41 (baseline).  It is now 1.37.  5. Prophylaxis: heparin   6. Disposition: Patient has a bed at SNF.  Discharge today.   Length of Stay: 3 days   Signed by:  Dorthula Rue. Earlene Plater, MD PGY-I, Internal Medicine Pager [phone]  01/17/2012, 10:19 AM

## 2012-01-17 NOTE — Progress Notes (Signed)
Internal Medicine Teaching Service Attending Note Date: 01/17/2012  Patient name: [name]  Medical record number: [id number]  Date of birth: Sep 26, 1940    This patient has been seen and discussed with the house staff. Please see their note for complete details. I concur with their findings with the following additions/corrections: [name] was visiting with her husband and son. She only c/o L foot pain but is able to weight bear and walk. On exam, no gross abnl, nl ROM, no sig tenderness on palp. She is stable for D/C to SNF.  Rylyn Ranganathan 01/17/2012, 11:47 AM

## 2012-01-17 NOTE — Progress Notes (Signed)
Telephoned [facility] with report given to [place]. Pt discharged to facility via ambulance. Pt is hemodynamically stable. Report given to transporters also. Family at bedside for transfer.

## 2012-01-17 NOTE — Progress Notes (Signed)
Physical Therapy Treatment Patient Details Name: [name] MRN: [id number] DOB: [birth year] Today's Date: 01/17/2012 Time: 1478-2956 PT Time Calculation (min): 8 min  PT Assessment / Plan / Recommendation Comments on Treatment Session  Improvements noted today.  Should progress well at SNF.    Follow Up Recommendations  Skilled nursing facility;Supervision/Assistance - 24 hour;Supervision for mobility/OOB    Barriers to Discharge        Equipment Recommendations  Defer to next venue    Recommendations for Other Services    Frequency Min 3X/week   Plan Discharge plan remains appropriate    Precautions / Restrictions Precautions Precautions: Fall Restrictions Weight Bearing Restrictions: No Other Position/Activity Restrictions: movement of left LE limited by pain   Pertinent Vitals/Pain Pain in left knee, not rated.  No distress.    Mobility  Transfers Transfers: Sit to Stand;Stand to Sit Sit to Stand: 4: Min guard;From chair/3-in-1;With upper extremity assist;With armrests Stand to Sit: 4: Min guard;With upper extremity assist;To bed Details for Transfer Assistance: occasional hand placement cues, both verbal and manual; safety cues Ambulation/Gait Ambulation/Gait Assistance: 4: Min assist Ambulation Distance (Feet): 20 Feet Assistive device: Rolling walker Ambulation/Gait Assistance Details: Pt. moving more freely today, though not putting left foot flat.  Bearing bulk of weight on left foot through forefoot.   Gait Pattern: Step-to pattern;Narrow base of support Stairs: No Wheelchair Mobility Wheelchair Mobility: No    Exercises     PT Diagnosis:    PT Problem List:   PT Treatment Interventions:     PT Goals Acute Rehab PT Goals PT Goal: Sit to Stand - Progress: Progressing toward goal PT Goal: Stand to Sit - Progress: Progressing toward goal PT Goal: Ambulate - Progress: Progressing toward goal  Visit Information  Last PT Received On:  01/17/12 Assistance Needed: +1    Subjective Data  Subjective: I'm getting better each day   Cognition  Overall Cognitive Status: Appears within functional limits for tasks assessed/performed Arousal/Alertness: Awake/alert Orientation Level: Oriented X4 / Intact;Other (comment) Behavior During Session: [facility] for tasks performed Cognition - Other Comments: no significant delay in processing noted today    Balance     End of Session PT - End of Session Equipment Utilized During Treatment: Gait belt Activity Tolerance: Patient tolerated treatment well;Other (comment) (better tolerance of mobility with less pain today) Patient left: in bed;with call bell/phone within reach;with family/visitor present   GP     Ferman Hamming 01/17/2012, 1:37 PM Acute Rehabilitation Services [phone] (pager)

## 2012-01-17 NOTE — Discharge Summary (Signed)
Patient Name:  [name] MRN: [id number]  PCP: Malissa Hippo, MD DOB:  [birth year]       Date of Admission:  01/14/2012  Date of Discharge:  01/17/2012      Attending Physician: Burns Spain, MD         DISCHARGE DIAGNOSES: 1.   Fall:  Stable. - patient fell because gait instability, then after crawling through her home, passed out as she tried to pull herself to standing - workup here has been negative  2.   UTI:  Treating. 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 Coli, treated with 3 days of ceftriaxone, to SNF on cefpodoxime  3.   Anemia:  Stable. - Chronically anemic, hemoglobin ~10 - Concentrated to 11 on admission (dehydration); leveled out at 10 with hydration  4.   Acute on chronic kidney disease:  Resolved. - Baseline creatinine ~1.45, elevated to 1.82 at admission from dehydration - Down to 1.37 at discharge   DISPOSITION AND FOLLOW-UP: MARGEAN KORELL is to follow-up with the listed providers as detailed below, at which time, the following should be addressed:   1. Follow-up visits: 1. PRIMARY CARE: 1. UTI should be adequately treated.  Ensure patient has routine urology follow-up. 2. Chronic problems appeared stable during this admission.    2. Pending labs/ test needing follow-up: 1. Blood cultures showed no growth but were not finalized at discharge.   Follow-up Information    Follow up with Malissa Hippo, MD on 02/12/2012. (PRIMARY CARE - Your appointment is on 02/12/2012 at 9:30AM)    Contact information:   [address] [place] [address] [phone]         Discharge Orders    Future Appointments: Provider: Department: Dept Phone: Center:   02/12/2012 9:45 AM Malissa Hippo, MD [facility] [phone] None   05/05/2012 9:30 AM Malissa Hippo, MD [facility] [phone] None       DISCHARGE MEDICATIONS: Medication List  As of 01/17/2012 11:09 AM   TAKE these  medications         alendronate 70 MG tablet   Commonly known as: FOSAMAX   Take 70 mg by mouth every 7 (seven) days. Take with a full glass of water on an empty stomach. Takes on Sunday.      ALPRAZolam 0.5 MG tablet   Commonly known as: XANAX   Take 1 tablet (0.5 mg total) by mouth 3 (three) times daily.      aspirin 81 MG tablet   Take 81 mg by mouth daily.      calcium citrate-vitamin D 200-200 MG-UNIT Tabs   Take 1 tablet by mouth daily.      carvedilol 25 MG tablet   Commonly known as: COREG   Take 1 tablet (25 mg total) by mouth 2 (two) times daily with a meal.      cefpodoxime 200 MG tablet   Commonly known as: VANTIN   Take 1 tablet (200 mg total) by mouth every 12 (twelve) hours. Take for 5 days.  Start with the evening dose on July 25.  Finish after the evening dose on July 29.      doxepin 75 MG capsule   Commonly known as: SINEQUAN   Take 1 capsule (75 mg total) by mouth at bedtime.      ferrous gluconate 325 MG tablet   Commonly known as: FERGON   Take  325 mg by mouth daily with breakfast.      fish oil-omega-3 fatty acids 1000 MG capsule   Take 1 g by mouth 4 (four) times daily.      furosemide 40 MG tablet   Commonly known as: LASIX   Take 1 tablet (40 mg total) by mouth daily.      hydrALAZINE 10 MG tablet   Commonly known as: APRESOLINE   Take 1 tablet (10 mg total) by mouth 3 (three) times daily.      HYDROcodone-acetaminophen 5-500 MG per tablet   Commonly known as: VICODIN   Take 2 tablets by mouth 2 (two) times daily between meals as needed for pain.      HYDROcodone-acetaminophen 5-325 MG per tablet   Commonly known as: NORCO/VICODIN   Take 1-2 tablets by mouth every 4 (four) hours as needed.      multivitamin tablet   Take 1 tablet by mouth daily.      nitroGLYCERIN 0.4 MG SL tablet   Commonly known as: NITROSTAT   Place 1 tablet (0.4 mg total) under the tongue every 5 (five) minutes as needed for chest pain.      omeprazole 20 MG  capsule   Commonly known as: PRILOSEC   Take 1 capsule (20 mg total) by mouth daily.      pyridOXINE 100 MG tablet   Commonly known as: VITAMIN B-6   Take 100 mg by mouth daily.      sodium bicarbonate 650 MG tablet   Take 650 mg by mouth 2 (two) times daily.      vitamin C 500 MG tablet   Commonly known as: ASCORBIC ACID   Take 500 mg by mouth daily.            PROCEDURES PERFORMED:  Dg Chest 2 View 01/16/2012 IMPRESSION: Mild vascular congestion.  Low lung volumes.  Borderline cardiac enlargement. No active infiltrates.  Dg Hip Complete Left 01/14/2012 IMPRESSION: No acute bony findings.   Ct Head Wo Contrast 01/15/2012 IMPRESSION: Chronic nonspecific white matter changes. No acute intracranial abnormality.  Ct Hip Left Wo Contrast 01/14/2012 IMPRESSION: Negative for fracture or acute abnormality.  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  Dg Knee Complete 4 Views Left 01/14/2012 IMPRESSION: No acute findings.     ADMISSION DATA: H&P:Pt is a 71yo F with a PMHX of CHF (recent echo showed normal EF), CRI (baseline Cr 1.4), and chronic anemia (baseline Hgb 10). She presented to [facility] for evaluation of a fall. Patient notes that approximately 6 pm yesterday, she was found to be down on the ground by her husband after approximately a 4 hour period of time. The patient was unable to stand up and therefore, was picked up and placed in the bed by her husband. She noted left hip to be hurting and refused to go to the ER. She subsequently continued to be in pain, and approximately 12AM, started to scream out in pain, which prompted evaluation in the ED. In regards to her mental status, the patient returned to her baseline mental status immediately. No notable fecal or urine incontinence.   The patient has chronic chest pain that occurs 1 to 2 times a day. She also has a history of GERD for which she take pantoprazole daily. The chest pain is anginal in  nature and occurs when she is "rushed" or is working around the house. It is quickly relieved with NTG.   The daughters indicate the patient  has been more weak over the last several days, although there has been a gradual deterioration in health over last year or so. Her mood has been more labile over last few weeks as well. The patient denied dizziness yesterday before the fall, but does endorse dizziness as a more chronic problem. She also has had more frequent headaches this weak, which have responded to aspirin.  Physical Exam: Vitals: Blood pressure 123/52, pulse 79, temperature 99.9 F (37.7 C), temperature source Oral, resp. rate 18, SpO2 99.00%.  Constitutional: No distress.  HENT:  Head: Normocephalic and atraumatic.  Mouth/Throat: Uvula is midline. Mucous membranes are dry. No oropharyngeal exudate or posterior oropharyngeal erythema.  Eyes: Conjunctivae and EOM are normal. Pupils are equal, round, and reactive to light.  Neck: Trachea normal. No JVD present. Carotid bruit is not present. No mass and no thyromegaly present.  Cardiovascular: Normal rate, regular rhythm, normal heart sounds and normal pulses.  Respiratory: Effort normal. She has wheezes in the right upper field. She has rhonchi. She has no rales.  GI: Soft. Bowel sounds are normal. Ileostomy present LLQ. Musculoskeletal:  Right hip: Normal.  Left hip: She exhibits tenderness.  Right knee: Normal.  Left knee: Normal.  Legs: extreme tenderness to palpation and hip flexion on the posterior and inferior aspect of the left thigh. Lymphadenopathy: She has no cervical adenopathy.  Neurological: She is alert. She has normal strength. No cranial nerve deficit or sensory deficit.  Skin: Skin is warm, dry and intact. Bruising (right brachium) noted.   Labs: Basic Metabolic Panel:  [facility]  01/14/12 0540   NA  136   K  4.3   CL  100   CO2  24   GLUCOSE  112*   BUN  42*   CREATININE  1.82*   CALCIUM  9.0   MG  --     PHOS  --    CBC:  Basename  01/14/12 0540   WBC  10.0   NEUTROABS  8.2*   HGB  11.0*   HCT  32.8*   MCV  93.7   PLT  264    Cardiac Enzymes:  Basename  01/14/12 0540   CKTOTAL  126   CKMB  --   CKMBINDEX  --   TROPONINI  --    Urine Drug Screen:    Component  Value  Date/Time    LABOPIA  POSITIVE*  07/18/2010 1057    COCAINSCRNUR  NONE DETECTED  07/18/2010 1057    LABBENZ  POSITIVE*  07/18/2010 1057    AMPHETMU  NONE DETECTED  07/18/2010 1057    THCU  NONE DETECTED  07/18/2010 1057    LABBARB  Value: NONE DETECTED DRUG SCREEN FOR MEDICAL PURPOSES ONLY. IF CONFIRMATION IS NEEDED FOR ANY PURPOSE, NOTIFY LAB WITHIN 5 DAYS. LOWEST DETECTABLE LIMITS FOR URINE DRUG SCREEN Drug Class Cutoff (ng/mL) Amphetamine 1000 Barbiturate 200 Benzodiazepine 200 Tricyclics 300 Opiates 300 Cocaine 300 THC 50  07/18/2010 1057    Urinalysis:  Basename  01/14/12 0812   COLORURINE  YELLOW   LABSPEC  1.007   PHURINE  7.0   GLUCOSEU  NEGATIVE   HGBUR  TRACE*   BILIRUBINUR  NEGATIVE   KETONESUR  NEGATIVE   PROTEINUR  NEGATIVE   UROBILINOGEN  0.2   NITRITE  POSITIVE*   LEUKOCYTESUR  SMALL*     HOSPITAL COURSE: 1.   Fall:  There are numerous etiologies in play here. The patient has had a problem with gait instability  and falls recently.  Her fall was not precipitated by a LOC.  Rather, gait instability caused the fall. Then, after crawling across the house, she passed out with a prodrome of lightheadedness. This makes orthostatic hypotension the most likely etiology for the LOC. An acute illness stemming from a UTI (#2) could certainly exacerbate orthostatic hypotension and a worsening of gait. The presence of lightheadedness prior to LOC makes cardiac causes such as dysrhythmia less likely. Her anginal symptoms seem stable; this has not been evaluated invasively because of renal insufficiency, but CEs and EKG were normal here. A stroke would be an unlikely cause in this case and the patient has no history  of seizure activity. Polypharmacy is likely contributing to her gait instability. Head CT was negative for acute intracranial processes. Vitamin B12 was normal.  Imaging of the hip, thigh and knee were normal. Discharged to SNF per PT and OT recommendations.  2.   UTI:  Patient's bladder was surgically removed in 1974. 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 Urinalysis on admission was concerning for a UTI. UCx is growing > 100,000 E. coli. This is resistant to Bactrim, ciprofloxacin, ampicillin, levofloxacin, and is intermediate with cefazolin. BCx are NGTD.  We treated with 3 days of IV ceftriaxone and half a day on oral cefpodoxime.  She will be discharged and complete a 5 day course of  cefpodoxime  3  . Anemia:  Stable. Chronically anemic with a baseline hemoglobin that hovers around 10. It was 11 on admission which likely represented a degree of hemoconcentration. This decreased to 10 with IV hydration.  4.   Acute on chronic kidney disease:  Resolved. Baseline creatinine is around 1.4 to 1.5. It was 1.82 on admission with a BUN of 42. This likely represented AKI superimposed on CKD secondary to dehydration. It is most likely pre-renal from dehydration or possibly over diuresis. She was given Lasix 40mg  QD on 7/15 and told to take this until LE edema resolved and then on a PRN basis thereafter. After a night of IV hydration, creatinine decreased to 1.41 (baseline).  It was 1.37 at discharge.   DISCHARGE DATA: Vital Signs: BP 151/68  Pulse 71  Temp 98.3 F (36.8 C) (Oral)  Resp 20  Ht 5\' 5"  (1.651 m)  Wt 109 lb 5.6 oz (49.6 kg)  BMI 18.20 kg/m2  SpO2 97%  Labs: Results for orders placed during the hospital encounter of 01/14/12 (from the past 24 hour(s))  BASIC METABOLIC PANEL     Status: Abnormal   Collection Time   01/17/12  6:00 AM      Component Value Range   Sodium 139  135 - 145 mEq/L   Potassium 4.0  3.5 - 5.1 mEq/L   Chloride 106  96 - 112 mEq/L   CO2 22   19 - 32 mEq/L   Glucose, Bld 88  70 - 99 mg/dL   BUN 25 (*) 6 - 23 mg/dL   Creatinine, Ser 9.60 (*) 0.50 - 1.10 mg/dL   Calcium 8.8  8.4 - 45.4 mg/dL   GFR calc non Af Amer 38 (*) >90 mL/min   GFR calc Af Amer 44 (*) >90 mL/min     Time spent on discharge: 30 minutes   Signed by:  Dorthula Rue. Earlene Plater, MD PGY-I, Internal Medicine  01/17/2012, 11:09 AM

## 2012-01-17 NOTE — Progress Notes (Signed)
Clinical Social Wor-CSW faxed paperwork to SNF and facilitated pt d/c to [facility] with [facility] transportation-No further needs- Jodean Lima, [phone]

## 2012-01-18 LAB — URINE CULTURE: Colony Count: >=100000

## 2012-01-21 ENCOUNTER — Telehealth (INDEPENDENT_AMBULATORY_CARE_PROVIDER_SITE_OTHER): Payer: Self-pay | Admitting: *Deleted

## 2012-01-21 DIAGNOSIS — N390 Urinary tract infection, site not specified: Secondary | ICD-10-CM

## 2012-01-21 LAB — CULTURE, BLOOD (ROUTINE X 2): Culture: NO GROWTH

## 2012-01-21 NOTE — Telephone Encounter (Signed)
Per Dr.Rehman the patient will need to have urine culture in 2 weeks.

## 2012-01-23 ENCOUNTER — Encounter (INDEPENDENT_AMBULATORY_CARE_PROVIDER_SITE_OTHER): Payer: Self-pay | Admitting: *Deleted

## 2012-01-23 ENCOUNTER — Other Ambulatory Visit (INDEPENDENT_AMBULATORY_CARE_PROVIDER_SITE_OTHER): Payer: Self-pay | Admitting: *Deleted

## 2012-01-23 DIAGNOSIS — N390 Urinary tract infection, site not specified: Secondary | ICD-10-CM

## 2012-01-24 ENCOUNTER — Telehealth (INDEPENDENT_AMBULATORY_CARE_PROVIDER_SITE_OTHER): Payer: Self-pay | Admitting: *Deleted

## 2012-01-24 NOTE — Telephone Encounter (Signed)
Phone call from Krisandra, Bueno is in [facility] now and can't do lab work

## 2012-01-24 NOTE — Telephone Encounter (Signed)
Noted  

## 2012-02-12 ENCOUNTER — Ambulatory Visit (INDEPENDENT_AMBULATORY_CARE_PROVIDER_SITE_OTHER): Payer: Medicare Other | Admitting: Internal Medicine

## 2012-02-28 ENCOUNTER — Other Ambulatory Visit (INDEPENDENT_AMBULATORY_CARE_PROVIDER_SITE_OTHER): Payer: Self-pay | Admitting: Internal Medicine

## 2012-05-05 ENCOUNTER — Encounter (INDEPENDENT_AMBULATORY_CARE_PROVIDER_SITE_OTHER): Payer: Self-pay | Admitting: Internal Medicine

## 2012-05-05 ENCOUNTER — Ambulatory Visit (INDEPENDENT_AMBULATORY_CARE_PROVIDER_SITE_OTHER): Payer: Medicare Other | Admitting: Internal Medicine

## 2012-05-05 ENCOUNTER — Telehealth (INDEPENDENT_AMBULATORY_CARE_PROVIDER_SITE_OTHER): Payer: Self-pay | Admitting: *Deleted

## 2012-05-05 ENCOUNTER — Ambulatory Visit (HOSPITAL_COMMUNITY)
Admission: RE | Admit: 2012-05-05 | Discharge: 2012-05-05 | Disposition: A | Discharge status: Home / Self Care | Payer: Medicare Other | Source: Ambulatory Visit | Attending: Internal Medicine | Admitting: Internal Medicine

## 2012-05-05 VITALS — BP 102/70 | HR 66 | Temp 97.6°F | Resp 16 | Ht 65.0 in | Wt 121.4 lb

## 2012-05-05 DIAGNOSIS — R109 Unspecified abdominal pain: Secondary | ICD-10-CM

## 2012-05-05 DIAGNOSIS — M546 Pain in thoracic spine: Secondary | ICD-10-CM

## 2012-05-05 DIAGNOSIS — G4700 Insomnia, unspecified: Secondary | ICD-10-CM

## 2012-05-05 MED ORDER — HYDROCODONE-ACETAMINOPHEN 5-325 MG PO TABS: 1.0000 | ORAL_TABLET | ORAL | Status: DC | PRN

## 2012-05-05 MED ORDER — HYDROCODONE-ACETAMINOPHEN 5-325 MG PO TABS: 1.0000 | ORAL_TABLET | Freq: Two times a day (BID) | ORAL | Status: DC

## 2012-05-05 MED ORDER — DOXEPIN HCL 50 MG PO CAPS: 50.0000 mg | ORAL_CAPSULE | Freq: Every day | ORAL | Status: DC

## 2012-05-05 NOTE — Progress Notes (Signed)
Presenting complaint;  Interscapular pain since fall. Also needs refills on two medications.  Subjective:  Patient is 71 year old Caucasian female who is here for scheduled visit. She was last seen on 01/07/2012. She complains of interscapular pain since she lost her balance and hit side of the table about 3 weeks ago. She actually did not hit the floor. She did not experience posterior of symptoms but just lost her balance. Pain is fairly localized and constant. She denies shortness of breath or cough. Her appetite is fair. She is not sure why she has lost 10 pounds since her last visit. She does not take furosemide daily. She only takes it when she has lower extremity edema. He denies nausea or vomiting. Her heartburn is well controlled with therapy. Lately her bowels moving daily and no melena or rectal bleeding reported. She also complains of pain in small joints of both hands. She states she's been using aspirin since she ran out of her Vicodin last week. She was hospitalized at [facility]  end of July for urinary fact infection. He has an appointment with Dr. Caryn Section, her nephrologist on 05/12/2012.  Current Medications: Current Outpatient Prescriptions  Medication Sig Dispense Refill  . alendronate (FOSAMAX) 70 MG tablet Take 70 mg by mouth every 7 (seven) days. Take with a full glass of water on an empty stomach. Takes on Sunday.      Marland Kitchen aspirin 81 MG tablet Take 81 mg by mouth daily.        . calcium citrate-vitamin D 200-200 MG-UNIT TABS Take 1 tablet by mouth daily.        . carvedilol (COREG) 25 MG tablet Take 1 tablet (25 mg total) by mouth 2 (two) times daily with a meal.  60 tablet  12  . ferrous gluconate (FERGON) 325 MG tablet Take 325 mg by mouth daily with breakfast.        . fish oil-omega-3 fatty acids 1000 MG capsule Take 1 g by mouth 4 (four) times daily.       . furosemide (LASIX) 40 MG tablet Take 1 tablet (40 mg total) by mouth daily.  30 tablet  12  . hydrALAZINE (APRESOLINE) 10  MG tablet Take 1 tablet (10 mg total) by mouth 3 (three) times daily.  90 tablet  12  . Multiple Vitamin (MULTIVITAMIN) tablet Take 1 tablet by mouth daily.        Marland Kitchen omeprazole (PRILOSEC) 20 MG capsule Take 1 capsule (20 mg total) by mouth daily.  30 capsule  11  . sodium bicarbonate 650 MG tablet Take 650 mg by mouth 2 (two) times daily.      . vitamin C (ASCORBIC ACID) 500 MG tablet Take 500 mg by mouth daily.      Marland Kitchen XANAX 0.5 MG tablet TAKE 1 TABLET THREE TIMES DAILY.(MUST LAST ONE MONTH)  90 each  0  . doxepin (SINEQUAN) 75 MG capsule Take 1 capsule (75 mg total) by mouth at bedtime.  30 capsule  2  . HYDROcodone-acetaminophen (NORCO/VICODIN) 5-325 MG per tablet Take 1-2 tablets by mouth every 4 (four) hours as needed.  30 tablet  0  . nitroGLYCERIN (NITROSTAT) 0.4 MG SL tablet Place 1 tablet (0.4 mg total) under the tongue every 5 (five) minutes as needed for chest pain.  25 tablet  3  . pyridOXINE (VITAMIN B-6) 100 MG tablet Take 100 mg by mouth daily.      Marland Kitchen VICODIN 5-500 MG per tablet TAKE (1) TABLET TWICE DAILY.  60 each  0     Objective: Blood pressure 102/70, pulse 66, temperature 97.6 F (36.4 C), temperature source Oral, resp. rate 16, height 5\' 5"  (1.651 m), weight 121 lb 6.4 oz (55.067 kg). Patient is alert and in no acute distress. Conjunctiva is pink. Sclera is nonicteric Oropharyngeal mucosa is normal. No neck masses or thyromegaly noted. Examination of her back reveals no bony abnormality. No tenderness noted on percussion of thoracic spine. Cardiac exam with regular rhythm normal S1 and S2. No murmur or gallop noted. Lungs are clear to auscultation. Abdomen is flat with extensive scarring at left low quadrant and ileo-conduit in the right low quadrant with clear urine in bag. No LE edema or clubbing noted.   Assessment:  #1. Chronic abdominal pain. She has been on narcotic therapy for that over 30 years and has tolerated reduction in dose. #2. GERD. Symptoms well  controlled with therapy. #3. Interscapular pain since she had adjuvant table three weeks ago. Since pain is not going away we'll further evaluate plain films. #4. History of depression and insomnia. Will refill Sinequan. She has tolerated dose reduction. #5.CKD. Will hold off blood work today since she has office visit with Dr. Marina Gravel next week.   Plan:  Plain films of thoracic spine. Refill Sinequan 50 mg by mouth each bedtime 30 with 5 refills. Refill hydrocodone/acetaminophen 5/325 one twice a day when necessary 60 with 2 refills. Office visit in 3 months.

## 2012-05-05 NOTE — Telephone Encounter (Signed)
Needs Alendronate Sodium 70 mg refilled, take 1 capsule by mouth once a week.

## 2012-05-05 NOTE — Telephone Encounter (Signed)
Primary care

## 2012-05-05 NOTE — Patient Instructions (Signed)
Physician will contact you with results of x-rays.

## 2012-05-20 ENCOUNTER — Telehealth (INDEPENDENT_AMBULATORY_CARE_PROVIDER_SITE_OTHER): Payer: Self-pay | Admitting: *Deleted

## 2012-05-20 NOTE — Telephone Encounter (Signed)
I will address this with Dr.Rehman when he calls the office. 

## 2012-05-20 NOTE — Telephone Encounter (Signed)
Patient states Dr Caryn Section,  Nephrologist called her yesterday about her blood work and he told her the lasix was too low and she wants to know if Dr Karilyn Cota wants her to start taking it again

## 2012-05-27 ENCOUNTER — Encounter (HOSPITAL_COMMUNITY): Payer: Self-pay

## 2012-05-27 ENCOUNTER — Inpatient Hospital Stay (HOSPITAL_COMMUNITY)
Admission: AD | Admit: 2012-05-27 | Discharge: 2012-05-30 | DRG: 641 | Disposition: A | Discharge status: Skilled Nursing Facility | Payer: Medicare Other | Source: Ambulatory Visit | Attending: Internal Medicine | Admitting: Internal Medicine

## 2012-05-27 ENCOUNTER — Inpatient Hospital Stay (HOSPITAL_COMMUNITY): Payer: Medicare Other

## 2012-05-27 DIAGNOSIS — F411 Generalized anxiety disorder: Secondary | ICD-10-CM | POA: Diagnosis present

## 2012-05-27 DIAGNOSIS — W19XXXA Unspecified fall, initial encounter: Secondary | ICD-10-CM

## 2012-05-27 DIAGNOSIS — N183 Chronic kidney disease, stage 3 unspecified: Secondary | ICD-10-CM | POA: Diagnosis present

## 2012-05-27 DIAGNOSIS — N390 Urinary tract infection, site not specified: Secondary | ICD-10-CM

## 2012-05-27 DIAGNOSIS — A088 Other specified intestinal infections: Secondary | ICD-10-CM | POA: Diagnosis present

## 2012-05-27 DIAGNOSIS — R112 Nausea with vomiting, unspecified: Secondary | ICD-10-CM

## 2012-05-27 DIAGNOSIS — Z87891 Personal history of nicotine dependence: Secondary | ICD-10-CM

## 2012-05-27 DIAGNOSIS — G4700 Insomnia, unspecified: Secondary | ICD-10-CM

## 2012-05-27 DIAGNOSIS — M546 Pain in thoracic spine: Secondary | ICD-10-CM

## 2012-05-27 DIAGNOSIS — R531 Weakness: Secondary | ICD-10-CM

## 2012-05-27 DIAGNOSIS — F419 Anxiety disorder, unspecified: Secondary | ICD-10-CM

## 2012-05-27 DIAGNOSIS — Z9089 Acquired absence of other organs: Secondary | ICD-10-CM

## 2012-05-27 DIAGNOSIS — N189 Chronic kidney disease, unspecified: Secondary | ICD-10-CM | POA: Diagnosis present

## 2012-05-27 DIAGNOSIS — E871 Hypo-osmolality and hyponatremia: Principal | ICD-10-CM | POA: Diagnosis present

## 2012-05-27 DIAGNOSIS — J069 Acute upper respiratory infection, unspecified: Secondary | ICD-10-CM | POA: Diagnosis present

## 2012-05-27 DIAGNOSIS — F329 Major depressive disorder, single episode, unspecified: Secondary | ICD-10-CM

## 2012-05-27 DIAGNOSIS — F3289 Other specified depressive episodes: Secondary | ICD-10-CM | POA: Diagnosis present

## 2012-05-27 DIAGNOSIS — D649 Anemia, unspecified: Secondary | ICD-10-CM | POA: Diagnosis present

## 2012-05-27 DIAGNOSIS — R82998 Other abnormal findings in urine: Secondary | ICD-10-CM | POA: Diagnosis present

## 2012-05-27 DIAGNOSIS — R1032 Left lower quadrant pain: Secondary | ICD-10-CM

## 2012-05-27 DIAGNOSIS — I129 Hypertensive chronic kidney disease with stage 1 through stage 4 chronic kidney disease, or unspecified chronic kidney disease: Secondary | ICD-10-CM | POA: Diagnosis present

## 2012-05-27 DIAGNOSIS — I10 Essential (primary) hypertension: Secondary | ICD-10-CM | POA: Diagnosis present

## 2012-05-27 DIAGNOSIS — Z905 Acquired absence of kidney: Secondary | ICD-10-CM

## 2012-05-27 DIAGNOSIS — R21 Rash and other nonspecific skin eruption: Secondary | ICD-10-CM | POA: Diagnosis present

## 2012-05-27 DIAGNOSIS — K219 Gastro-esophageal reflux disease without esophagitis: Secondary | ICD-10-CM | POA: Diagnosis present

## 2012-05-27 DIAGNOSIS — I509 Heart failure, unspecified: Secondary | ICD-10-CM | POA: Diagnosis present

## 2012-05-27 DIAGNOSIS — R109 Unspecified abdominal pain: Secondary | ICD-10-CM

## 2012-05-27 DIAGNOSIS — F0390 Unspecified dementia without behavioral disturbance: Secondary | ICD-10-CM | POA: Diagnosis present

## 2012-05-27 LAB — URINALYSIS, ROUTINE W REFLEX MICROSCOPIC
Leukocytes, UA: NEGATIVE
Specific Gravity, Urine: 1.004 — ABNORMAL LOW (ref 1.005–1.030)
Urobilinogen, UA: 1 mg/dL (ref 0.0–1.0)

## 2012-05-27 LAB — BASIC METABOLIC PANEL
CO2: 24 mEq/L (ref 19–32)
Calcium: 8.9 mg/dL (ref 8.4–10.5)
Chloride: 75 mEq/L — ABNORMAL LOW (ref 96–112)
Sodium: 112 mEq/L — CL (ref 135–145)

## 2012-05-27 LAB — URINE MICROSCOPIC-ADD ON

## 2012-05-27 LAB — CBC
MCH: 32.5 pg (ref 26.0–34.0)
MCHC: 36.5 g/dL — ABNORMAL HIGH (ref 30.0–36.0)
Platelets: 275 10*3/uL (ref 150–400)
RBC: 3.51 MIL/uL — ABNORMAL LOW (ref 3.87–5.11)

## 2012-05-27 LAB — LIPASE, BLOOD: Lipase: 59 U/L (ref 11–59)

## 2012-05-27 LAB — SODIUM, URINE, RANDOM: Sodium, Ur: 20 mEq/L

## 2012-05-27 MED ORDER — POTASSIUM CHLORIDE 10 MEQ/100ML IV SOLN
10.0000 meq | INTRAVENOUS | Status: AC
  Administered 2012-05-28 (×4): 10 meq via INTRAVENOUS
  Filled 2012-05-27 (×4): qty 100

## 2012-05-27 MED ORDER — DOXEPIN HCL 50 MG PO CAPS
50.0000 mg | ORAL_CAPSULE | Freq: Every day | ORAL | Status: DC
  Administered 2012-05-27: 50 mg via ORAL
  Filled 2012-05-27 (×2): qty 1

## 2012-05-27 MED ORDER — ASPIRIN 81 MG PO TABS: 81.0000 mg | ORAL_TABLET | Freq: Every day | ORAL | Status: DC

## 2012-05-27 MED ORDER — ONDANSETRON HCL 4 MG PO TABS
4.0000 mg | ORAL_TABLET | Freq: Four times a day (QID) | ORAL | Status: DC | PRN
  Administered 2012-05-28: 4 mg via ORAL
  Filled 2012-05-27: qty 1

## 2012-05-27 MED ORDER — DIPHENHYDRAMINE-ZINC ACETATE 2-0.1 % EX CREA
TOPICAL_CREAM | Freq: Three times a day (TID) | CUTANEOUS | Status: DC | PRN
  Administered 2012-05-29: 12:00:00 via TOPICAL
  Filled 2012-05-27: qty 28

## 2012-05-27 MED ORDER — HYDRALAZINE HCL 10 MG PO TABS
10.0000 mg | ORAL_TABLET | Freq: Three times a day (TID) | ORAL | Status: DC
  Administered 2012-05-27 – 2012-05-30 (×8): 10 mg via ORAL
  Filled 2012-05-27 (×10): qty 1

## 2012-05-27 MED ORDER — ALPRAZOLAM 0.5 MG PO TABS
0.5000 mg | ORAL_TABLET | Freq: Three times a day (TID) | ORAL | Status: DC
  Administered 2012-05-27: 0.5 mg via ORAL
  Filled 2012-05-27: qty 1

## 2012-05-27 MED ORDER — CARVEDILOL 25 MG PO TABS
25.0000 mg | ORAL_TABLET | Freq: Two times a day (BID) | ORAL | Status: DC
  Administered 2012-05-28 – 2012-05-30 (×5): 25 mg via ORAL
  Filled 2012-05-27 (×7): qty 1

## 2012-05-27 MED ORDER — NITROGLYCERIN 0.4 MG SL SUBL: 0.4000 mg | SUBLINGUAL_TABLET | SUBLINGUAL | Status: DC | PRN

## 2012-05-27 MED ORDER — MORPHINE SULFATE 2 MG/ML IJ SOLN
0.5000 mg | INTRAMUSCULAR | Status: DC | PRN
  Administered 2012-05-27: 1 mg via INTRAVENOUS
  Filled 2012-05-27: qty 1

## 2012-05-27 MED ORDER — ASPIRIN EC 81 MG PO TBEC
81.0000 mg | DELAYED_RELEASE_TABLET | Freq: Every day | ORAL | Status: DC
Start: 2012-05-28 — End: 2012-05-30
  Administered 2012-05-28 – 2012-05-30 (×3): 81 mg via ORAL
  Filled 2012-05-27 (×3): qty 1

## 2012-05-27 MED ORDER — CALCIUM CITRATE-VITAMIN D 200-200 MG-UNIT PO TABS: 1.0000 | ORAL_TABLET | Freq: Every day | ORAL | Status: DC

## 2012-05-27 MED ORDER — ONDANSETRON HCL 4 MG/2ML IJ SOLN
4.0000 mg | Freq: Four times a day (QID) | INTRAMUSCULAR | Status: DC | PRN
  Administered 2012-05-27: 4 mg via INTRAVENOUS
  Filled 2012-05-27: qty 2

## 2012-05-27 MED ORDER — ENOXAPARIN SODIUM 30 MG/0.3ML ~~LOC~~ SOLN
30.0000 mg | SUBCUTANEOUS | Status: DC
  Administered 2012-05-27 – 2012-05-29 (×3): 30 mg via SUBCUTANEOUS
  Filled 2012-05-27 (×4): qty 0.3

## 2012-05-27 MED ORDER — ACETAMINOPHEN 650 MG RE SUPP: 650.0000 mg | Freq: Four times a day (QID) | RECTAL | Status: DC | PRN

## 2012-05-27 MED ORDER — PANTOPRAZOLE SODIUM 40 MG PO TBEC
40.0000 mg | DELAYED_RELEASE_TABLET | Freq: Every day | ORAL | Status: DC
  Administered 2012-05-28 – 2012-05-30 (×3): 40 mg via ORAL
  Filled 2012-05-27 (×3): qty 1

## 2012-05-27 MED ORDER — FERROUS GLUCONATE 324 (38 FE) MG PO TABS
325.0000 mg | ORAL_TABLET | Freq: Every day | ORAL | Status: DC
  Administered 2012-05-28 – 2012-05-30 (×3): 325 mg via ORAL
  Filled 2012-05-27 (×4): qty 1

## 2012-05-27 MED ORDER — ONE-DAILY MULTI VITAMINS PO TABS: 1.0000 | ORAL_TABLET | Freq: Every day | ORAL | Status: DC

## 2012-05-27 MED ORDER — CALCIUM CARBONATE-VITAMIN D 500-200 MG-UNIT PO TABS
1.0000 | ORAL_TABLET | Freq: Every day | ORAL | Status: DC
  Administered 2012-05-28 – 2012-05-30 (×3): 1 via ORAL
  Filled 2012-05-27 (×3): qty 1

## 2012-05-27 MED ORDER — SODIUM BICARBONATE 650 MG PO TABS
650.0000 mg | ORAL_TABLET | Freq: Two times a day (BID) | ORAL | Status: DC
  Administered 2012-05-27 – 2012-05-30 (×6): 650 mg via ORAL
  Filled 2012-05-27 (×7): qty 1

## 2012-05-27 MED ORDER — SODIUM CHLORIDE 0.9 % IV SOLN
INTRAVENOUS | Status: DC
  Administered 2012-05-27 – 2012-05-29 (×3): via INTRAVENOUS

## 2012-05-27 MED ORDER — VITAMIN C 500 MG PO TABS
500.0000 mg | ORAL_TABLET | Freq: Every day | ORAL | Status: DC
  Administered 2012-05-28 – 2012-05-30 (×3): 500 mg via ORAL
  Filled 2012-05-27 (×3): qty 1

## 2012-05-27 MED ORDER — VITAMIN B-6 100 MG PO TABS
100.0000 mg | ORAL_TABLET | Freq: Every day | ORAL | Status: DC
Start: 2012-05-28 — End: 2012-05-30
  Administered 2012-05-28 – 2012-05-30 (×3): 100 mg via ORAL
  Filled 2012-05-27 (×3): qty 1

## 2012-05-27 MED ORDER — GUAIFENESIN-DM 100-10 MG/5ML PO SYRP
5.0000 mL | ORAL_SOLUTION | ORAL | Status: DC | PRN
  Filled 2012-05-27 (×2): qty 5

## 2012-05-27 MED ORDER — HYDROCODONE-ACETAMINOPHEN 5-325 MG PO TABS
1.0000 | ORAL_TABLET | ORAL | Status: DC | PRN
  Administered 2012-05-27 – 2012-05-29 (×2): 2 via ORAL
  Administered 2012-05-30: 1 via ORAL
  Filled 2012-05-27: qty 2
  Filled 2012-05-27: qty 1
  Filled 2012-05-27: qty 2

## 2012-05-27 MED ORDER — ADULT MULTIVITAMIN W/MINERALS CH
1.0000 | ORAL_TABLET | Freq: Every day | ORAL | Status: DC
  Administered 2012-05-28 – 2012-05-30 (×3): 1 via ORAL
  Filled 2012-05-27 (×3): qty 1

## 2012-05-27 MED ORDER — ACETAMINOPHEN 325 MG PO TABS: 650.0000 mg | ORAL_TABLET | Freq: Four times a day (QID) | ORAL | Status: DC | PRN

## 2012-05-27 NOTE — Progress Notes (Addendum)
Skin assessment done. The following were noted: Bilateral hands are reddened and fingertips are cracked, painful to touch. Left forearm has reddened spots that the patient described as " slightly itchy" Bruise on the left upperarm- patient verbalized she had a fall weeks ago. Bruise on Right upperarm -  Patient stated from flu shot. Bilateral thighs with healed skin graft donor site. Healed scars from surgery on abdomen and left hip/thigh.

## 2012-05-27 NOTE — H&P (Addendum)
[facility] History and Physical  CHERRI YERA ZOX:[id number] DOB: 1941-05-16 DOA: 05/27/2012  Referring physician: Caryn Section PCP: Malissa Hippo, MD  Specialists:   Chief Complaint: nausea/vomiting and low Na per MD  HPI: [name] is a 71 y.o. female with h/o CKD followed by Dr Caryn Section, ileal conduit, CHF, HTN, chronic anemia who went for a follow up appt today with Dr Caryn Section and labs revealed a Na of120 with Cr of 1.9 - per renal her last Cr was 2.6 prior to this( per epic cr in 07/13 was1.37). Pt was directly admitted from the office for further evaluation and management. Pt states that 8days ago she got her flu shot and shortly thereafter she developed URI symptoms and hoarseness as well. Then 3days ago she began having nausea and vomiting- nonbloody, associated with abdominal pain- mostly in her LLQ. Per her husband she was vomiting anything she tried to eat or drink through yesterday. Pt states she has not vomited today. She denies diarrhea, fevers, melena and no hematochezia Pt has not had any confusion or seizure like activity per husband. She states she continued to take her meds including lasix.  Review of Systems: The patient denies anorexia, fever, weight loss, vision loss, decreased hearing, hoarseness, chest pain, syncope, dyspnea on exertion, peripheral edema, balance deficits, hemoptysis, melena, hematochezia, severe indigestion/heartburn, hematuria, incontinence muscle weakness, lesions, transient blindness, difficulty walking, depression, unusual weight change.+rash on L. Forearm, and reports fissuring on her fingers.   Past Medical History  Diagnosis Date  . Hypertension   . Chronic kidney disease   . CHF (congestive heart failure)   . Gastroesophageal reflux   . Chronic UTI   . Hydronephrosis     She has chronic mild left   . AV fistula     clotted in her left forearm  . Cellulitis   . Pneumothorax     secondary to central line placement  . Acute systolic heart  failure     Again, EF 25-30%  . Single kidney   . Myocardial infarction 2011; 2012  . Numbness and tingling of both legs   . Anginal pain   . Heart murmur   . Dysrhythmia     "heart beats real fast"  . Chronic anemia   . Pneumonia     "one time"  . Chronic chest pain 01/14/12    1-2 X/day/H&P  . Arthritis     "at different places"  . Anxiety   . Depression   . Headache     "alot; not daily"  . Frequent falls   . Dementia    Past Surgical History  Procedure Date  . Hip arthroplasty     left; S/P fracture  . Rod left leg 2012    left hip "to almost my knee; for my hip"  . Creation / revision of ileostomy / jejunostomy 1973  . Nephrectomy 1973    right  . Fracture surgery   . Partial colectomy 1970  . Tonsillectomy and adenoidectomy     "when I was a kid"  . Cholecystectomy 2000  . Appendectomy 2000  . Abdominal hysterectomy   . Dilation and curettage of uterus   . Cesarean section 1969    only 4th of 4 children was c-section  . Av fistula placement 1970's    LFA   Social History:  reports that she quit smoking about 7 years ago. Her smoking use included Cigarettes. She quit after 2 years of use. She has never used  smokeless tobacco. She reports that she does not drink alcohol or use illicit drugs. where does patient live--home with husband   Allergies  Allergen Reactions  . Ketorolac Tromethamine Rash    Family History  Problem Relation Age of Onset  . Heart attack Mother   . Healthy Daughter   . Healthy Daughter   . Healthy Son     Prior to Admission medications   Medication Sig Start Date End Date Taking? Authorizing Provider  alendronate (FOSAMAX) 70 MG tablet Take 70 mg by mouth every 7 (seven) days. Take with a full glass of water on an empty stomach. Takes on Sunday.    Historical Provider, MD  aspirin 81 MG tablet Take 81 mg by mouth daily.      Historical Provider, MD  calcium citrate-vitamin D 200-200 MG-UNIT TABS Take 1 tablet by mouth daily.       Historical Provider, MD  carvedilol (COREG) 25 MG tablet Take 1 tablet (25 mg total) by mouth 2 (two) times daily with a meal. 10/22/11   Vesta Mixer, MD  doxepin (SINEQUAN) 50 MG capsule Take 1 capsule (50 mg total) by mouth at bedtime. 05/05/12   Malissa Hippo, MD  ferrous gluconate (FERGON) 325 MG tablet Take 325 mg by mouth daily with breakfast.      Historical Provider, MD  fish oil-omega-3 fatty acids 1000 MG capsule Take 1 g by mouth 4 (four) times daily.     Historical Provider, MD  furosemide (LASIX) 40 MG tablet Take 1 tablet (40 mg total) by mouth daily. 11/05/11   Vesta Mixer, MD  hydrALAZINE (APRESOLINE) 10 MG tablet Take 1 tablet (10 mg total) by mouth 3 (three) times daily. 11/05/11   Vesta Mixer, MD  HYDROcodone-acetaminophen (NORCO/VICODIN) 5-325 MG per tablet Take 1-2 tablets by mouth every 4 (four) hours as needed. 05/05/12 05/15/12  Malissa Hippo, MD  HYDROcodone-acetaminophen (NORCO/VICODIN) 5-325 MG per tablet Take 1 tablet by mouth 2 (two) times daily. 05/05/12 05/15/12  Malissa Hippo, MD  Multiple Vitamin (MULTIVITAMIN) tablet Take 1 tablet by mouth daily.      Historical Provider, MD  nitroGLYCERIN (NITROSTAT) 0.4 MG SL tablet Place 1 tablet (0.4 mg total) under the tongue every 5 (five) minutes as needed for chest pain. 04/20/11 04/19/12  Vesta Mixer, MD  omeprazole (PRILOSEC) 20 MG capsule Take 1 capsule (20 mg total) by mouth daily. 10/22/11   Vesta Mixer, MD  pyridOXINE (VITAMIN B-6) 100 MG tablet Take 100 mg by mouth daily.    Historical Provider, MD  sodium bicarbonate 650 MG tablet Take 650 mg by mouth 2 (two) times daily.    Historical Provider, MD  vitamin C (ASCORBIC ACID) 500 MG tablet Take 500 mg by mouth daily.    Historical Provider, MD   Physical Exam: Filed Vitals:   05/27/12 1719 05/27/12 1721  BP: 132/56   Pulse: 58   Height:  5\' 5"  (1.651 m)  Weight:  57.153 kg (126 lb)  SpO2: 100%     Constitutional: Vital signs reviewed.   Patient hoarse, well-developed elderly female,  thin appearing in no acute distress and cooperative with exam. Alert and oriented x3.  Head: Normocephalic and atraumatic Mouth: no erythema or exudates, dry MM Eyes: PERRL, EOMI, conjunctivae normal, No scleral icterus.  Neck: Supple, Trachea midline normal ROM, No JVD, mass, thyromegaly, or carotid bruit present.  Cardiovascular: RRR, S1 normal, S2 normal, no MRG, pulses symmetric and intact bilaterally Pulmonary/Chest:  CTAB, no wheezes, rales, or rhonchi Abdominal: Soft. LLQ tenderness, no rebound, non-distended, bowel sounds are normal, no masses, organomegaly, or guarding present.  GU: no CVA tenderness Extremities:no cyanosis, no edema Neurological: A&O x3, Strength is normal and symmetric bilaterally, cranial nerve II-XII are grossly intact, no focal motor deficit, sensory intact to light touch bilaterally.  Skin: R. Forearm with maculopapular erythematous rash, the thumb index and middle fingers on both hands with fissures.  Psychiatric: Normal mood and affect.   Labs on Admission:  Basic Metabolic Panel: No results found for this basename: NA:5,K:5,CL:5,CO2:5,GLUCOSE:5,BUN:5,CREATININE:5,CALCIUM:5,MG:5,PHOS:5 in the last 168 hours Liver Function Tests: No results found for this basename: AST:5,ALT:5,ALKPHOS:5,BILITOT:5,PROT:5,ALBUMIN:5 in the last 168 hours No results found for this basename: LIPASE:5,AMYLASE:5 in the last 168 hours No results found for this basename: AMMONIA:5 in the last 168 hours CBC: No results found for this basename: WBC:5,NEUTROABS:5,HGB:5,HCT:5,MCV:5,PLT:5 in the last 168 hours Cardiac Enzymes: No results found for this basename: CKTOTAL:5,CKMB:5,CKMBINDEX:5,TROPONINI:5 in the last 168 hours  BNP (last 3 results) No results found for this basename: PROBNP:3 in the last 8760 hours CBG: No results found for this basename: GLUCAP:5 in the last 168 hours  Radiological Exams on Admission: No results  found.    Assessment/Plan Principal Problem:  *Hyponatremia -in patient with h/o CHF and CKD but with above history and clinical findings, more likely prerenal etiology/volume depltion -hydrate, hold lasix, serial Bmet and further manage accordingly -also obtain urine lytes although might not be very helpful given her history. Also check TSH and follow Active Problems:  Nausea & vomiting/abd pain- resolving, possibly viral gastroenteritiis, given her recent URI -also obtain UA, lipase, follow and further manage accordingly -supportive care -also obtain abd x-rays, in reviewing records a h/o chronic LLQ is noted - follow and consider CT to further eval for possible diverticulitis when Cr improves - if pain persisting   Hypertension -continue outpt meds, follow  Chronic kidney disease -hydrate follow, recheck and further eval as appropriate if not improving. Consult renal/Dr Caryn Section in am as appropriate.  CHF (congestive heart failure) -compensated, monitor fluid status closely with hydration -follow and consider restarting lasix when clinically appropriate H/o Normocytic anemia -likely secondary to CKD - Check CBC and follow Rash - unclear etiology, symptom management. Derm referral inpt vs outpt pending clinical course URI -check cxr follow, symptom management for now - she is s/p flu shot about 1week ago. S/p ileal conduit H/O MI -she is chest pain free, continue outpt meds  Code Status: full Family Communication: husband at bedside  Disposition Plan: admit to tele  Time spent: >39mins  Kela Millin [facility] Pager [phone]  If 7PM-7AM, please contact night-coverage [email or web address] Password [email or web address] 05/27/2012, 7:25 PM

## 2012-05-27 NOTE — Progress Notes (Signed)
PENDING DIRECT ADMISSION NOTE:  Call received from:    Dr. Caryn Section  REASON FOR REQUESTING TRANSFER:    Hyponatremia, dehydration  HPI:  71 year-old female with CKD stage III, ileal conduit. Went today to see Dr. Caryn Section in his office, she was complaining about nausea and poor oral intake. She had right nephrectomy. She was found to have sodium of 120 and creatinine of 1.9. She has also some rash. Sent for further evaluation.   PLAN:  According to telephone report, this patient was accepted for transfer to [facility] ,   Under [facility] team:  [facility] 10,  I have requested an order be written to call Flow Manager at [phone] upon patient arrival to the floor for final physician assignment who will do the admission and give admitting orders.  SIGNED: Clint Lipps, MD [facility]  05/27/2012, 4:42 PM

## 2012-05-27 NOTE — Progress Notes (Signed)
Received patient as a direct admit from a doctor's office.  Alert and oriented x 4.  Baseline vital signs taken and recorded.  Complained of 8/10 chronic pain on her left hip.   Patient is accompanied by husband.

## 2012-05-28 DIAGNOSIS — G4700 Insomnia, unspecified: Secondary | ICD-10-CM

## 2012-05-28 DIAGNOSIS — I509 Heart failure, unspecified: Secondary | ICD-10-CM

## 2012-05-28 DIAGNOSIS — D649 Anemia, unspecified: Secondary | ICD-10-CM

## 2012-05-28 DIAGNOSIS — F411 Generalized anxiety disorder: Secondary | ICD-10-CM

## 2012-05-28 LAB — CBC
MCH: 33 pg (ref 26.0–34.0)
MCHC: 36.4 g/dL — ABNORMAL HIGH (ref 30.0–36.0)
MCV: 90.7 fL (ref 78.0–100.0)
Platelets: 259 10*3/uL (ref 150–400)
RDW: 14 % (ref 11.5–15.5)

## 2012-05-28 LAB — BASIC METABOLIC PANEL
BUN: 19 mg/dL (ref 6–23)
BUN: 27 mg/dL — ABNORMAL HIGH (ref 6–23)
CO2: 23 mEq/L (ref 19–32)
CO2: 20 mEq/L (ref 19–32)
CO2: 23 mEq/L (ref 19–32)
Calcium: 8.7 mg/dL (ref 8.4–10.5)
Calcium: 8.5 mg/dL (ref 8.4–10.5)
Creatinine, Ser: 1.8 mg/dL — ABNORMAL HIGH (ref 0.50–1.10)
Creatinine, Ser: 1.64 mg/dL — ABNORMAL HIGH (ref 0.50–1.10)
GFR calc non Af Amer: 32 mL/min — ABNORMAL LOW (ref >90)
GFR calc non Af Amer: 30 mL/min — ABNORMAL LOW (ref >90)
Glucose, Bld: 88 mg/dL (ref 70–99)
Glucose, Bld: 112 mg/dL — ABNORMAL HIGH (ref 70–99)
Glucose, Bld: 114 mg/dL — ABNORMAL HIGH (ref 70–99)
Potassium: 3.8 mEq/L (ref 3.5–5.1)
Sodium: 117 mEq/L — CL (ref 135–145)
Sodium: 125 mEq/L — ABNORMAL LOW (ref 135–145)

## 2012-05-28 MED ORDER — HYDROCODONE-ACETAMINOPHEN 5-325 MG PO TABS
1.0000 | ORAL_TABLET | Freq: Two times a day (BID) | ORAL | Status: DC
  Administered 2012-05-28 – 2012-05-30 (×5): 1 via ORAL
  Filled 2012-05-28 (×7): qty 1

## 2012-05-28 MED ORDER — DOXEPIN HCL 50 MG PO CAPS
50.0000 mg | ORAL_CAPSULE | Freq: Every day | ORAL | Status: DC
  Administered 2012-05-28 – 2012-05-29 (×2): 50 mg via ORAL
  Filled 2012-05-28 (×3): qty 1

## 2012-05-28 MED ORDER — LORATADINE 10 MG PO TABS
10.0000 mg | ORAL_TABLET | Freq: Every day | ORAL | Status: DC
  Administered 2012-05-28 – 2012-05-30 (×3): 10 mg via ORAL
  Filled 2012-05-28 (×3): qty 1

## 2012-05-28 MED ORDER — MORPHINE SULFATE 4 MG/ML IJ SOLN
4.0000 mg | Freq: Once | INTRAMUSCULAR | Status: AC
  Administered 2012-05-28: 4 mg via INTRAVENOUS
  Filled 2012-05-28: qty 1

## 2012-05-28 MED ORDER — PHENOL 1.4 % MT LIQD
1.0000 | OROMUCOSAL | Status: DC | PRN
  Administered 2012-05-28 (×4): 1 via OROMUCOSAL
  Filled 2012-05-28: qty 177

## 2012-05-28 MED ORDER — ALPRAZOLAM 0.5 MG PO TABS
0.5000 mg | ORAL_TABLET | Freq: Three times a day (TID) | ORAL | Status: DC
  Administered 2012-05-28 – 2012-05-30 (×7): 0.5 mg via ORAL
  Filled 2012-05-28 (×8): qty 1

## 2012-05-28 NOTE — Progress Notes (Addendum)
CRITICAL VALUE ALERT  Critical value received:  NA 117  Date of notification: 05/28/12  Time of notification:  0809  Critical value read back:yes  Nurse who received alert:  Karoline Caldwell, RN  MD notified (1st page):  Lavera Guise  Time of first page:  [phone]  MD notified (2nd page):  Time of second page:  Responding MD:  Lavera Guise  Time MD responded:  [phone]

## 2012-05-28 NOTE — Progress Notes (Signed)
[facility] PROGRESS NOTE  SABA GOMM ZOX:[id number] DOB: 1940-08-16 DOA: 05/27/2012 PCP: Malissa Hippo, MD  Assessment/Plan: *Hyponatremia  -in a patient with h/o CHF and CKD - but without clinical anasarca -  - suspect hypovolemic hyponatremia more likely prerenal etiology/volume depletion - from Gi illness  - started iv NS at 75 cc hr on admission , hold lasix, serial BMETs and further manage accordingly  - TSH - OK   Pyuria - await urine culture results   Nausea & vomiting/abd pain- resolving, possibly viral gastroenteritiis, given her recent URI  -also obtain UA, lipase, follow and further manage accordingly  -supportive care  -also obtain abd x-rays, in reviewing records a h/o chronic LLQ is noted  - follow and consider CT to further eval for possible diverticulitis when Cr improves - if pain persisting   Hypertension  -continue outpt meds, follow    Chronic kidney disease  Creatinine at baseline   CHF (congestive heart failure)  -compensated, monitor fluid status closely with hydration  -follow and consider restarting lasix when clinically appropriate   H/o Normocytic anemia  -likely secondary to CKD  - Check CBC and follow - no signs of bleeding   Rash  - unclear etiology, symptom management.   URI  -check cxr follow, symptom management for now  - she is s/p flu shot about 1week ago.   S/p ileal conduit   H/O MI  -she is chest pain free, continue outpt meds   Code Status: full Family Communication: patient  Disposition Plan: home    HPI/Subjective: Wants food   Objective: Filed Vitals:   05/27/12 1719 05/27/12 1721 05/27/12 2101 05/28/12 0543  BP: 132/56  126/48 116/56  Pulse: 58  62 67  Temp:   97.8 F (36.6 C) 98.7 F (37.1 C)  TempSrc:   Oral Oral  Resp:   20 18  Height:  5\' 5"  (1.651 m)    Weight:  57.153 kg (126 lb)    SpO2: 100%  97% 98%   Patient Vitals for the past 24 hrs:  BP Temp Temp src Pulse Resp SpO2 Height  Weight  05/28/12 0745 114/71 mmHg 98.3 F (36.8 C) Oral 61  18  98 % - -  05/28/12 0543 116/56 mmHg 98.7 F (37.1 C) Oral 67  18  98 % - -  05/27/12 2101 126/48 mmHg 97.8 F (36.6 C) Oral 62  20  97 % - -  05/27/12 1721 - - - - - - 5\' 5"  (1.651 m) 57.153 kg (126 lb)  05/27/12 1719 132/56 mmHg - - 58  - 100 % - -     Intake/Output Summary (Last 24 hours) at 05/28/12 0809 Last data filed at 05/27/12 2103  Gross per 24 hour  Intake    120 ml  Output   1200 ml  Net  -1080 ml   Filed Weights   05/27/12 1721  Weight: 57.153 kg (126 lb)    Exam:   General:  axox3  Cardiovascular: rrr, no m,r,g  Respiratory: ctab, no w,r,c   Abdomen: soft, nt, bs present   Data Reviewed: Basic Metabolic Panel:  Lab 05/28/12 8119 05/27/12 2031  NA 117* 112*  K 3.8 2.9*  CL 85* 75*  CO2 23 24  GLUCOSE 88 107*  BUN 17 17  CREATININE 1.57* 1.38*  CALCIUM 8.1* 8.9  MG -- --  PHOS -- --   Liver Function Tests: No results found for this basename: AST:5,ALT:5,ALKPHOS:5,BILITOT:5,PROT:5,ALBUMIN:5 in the last 168  hours  Lab 05/27/12 2031  LIPASE 59  AMYLASE --   No results found for this basename: AMMONIA:5 in the last 168 hours CBC:  Lab 05/28/12 0709 05/27/12 2031  WBC 7.3 11.3*  NEUTROABS -- --  HGB 10.7* 11.4*  HCT 29.4* 31.2*  MCV 90.7 88.9  PLT 259 275   Cardiac Enzymes: No results found for this basename: CKTOTAL:5,CKMB:5,CKMBINDEX:5,TROPONINI:5 in the last 168 hours BNP (last 3 results) No results found for this basename: PROBNP:3 in the last 8760 hours CBG: No results found for this basename: GLUCAP:5 in the last 168 hours  No results found for this or any previous visit (from the past 240 hour(s)).   Studies: Portable Chest 1 View  05/27/2012  *RADIOLOGY REPORT*  Clinical Data: Rule out infiltrate  PORTABLE CHEST - 1 VIEW  Comparison: 01/16/2012  Findings: Lungs are clear without infiltrate or effusion.  Negative for heart failure or mass lesion.  IMPRESSION: No  active cardiopulmonary disease.   Original Report Authenticated By: Janeece Riggers, M.D.    Dg Abd Portable 2v  05/27/2012  *RADIOLOGY REPORT*  Clinical Data: Nausea and vomiting for 1 week; left-sided abdominal pain and leg cramps.  PORTABLE ABDOMEN - 2 VIEW  Comparison: Abdominal radiograph performed 05/19/2010  Findings: The visualized bowel gas pattern is unremarkable.  The colon is partially filled with air; no abnormal dilatation of small bowel loops is seen to suggest small bowel obstruction.  No free intra-abdominal air is identified on the provided decubitus view.  The patient's left hip arthroplasty is incompletely imaged, but appears grossly unremarkable.  Soft tissue calcifications are again seen overlying the right flank.  The visualized lung bases are grossly clear, though not well characterized.  IMPRESSION: Unremarkable bowel gas pattern; no free intra-abdominal air seen.   Original Report Authenticated By: Tonia Ghent, M.D.     Scheduled Meds:   . ALPRAZolam  0.5 mg Oral TID  . aspirin EC  81 mg Oral Daily  . calcium-vitamin D  1 tablet Oral Daily  . carvedilol  25 mg Oral BID WC  . doxepin  50 mg Oral QHS  . enoxaparin (LOVENOX) injection  30 mg Subcutaneous Q24H  . ferrous gluconate  325 mg Oral Q breakfast  . hydrALAZINE  10 mg Oral TID  . loratadine  10 mg Oral Daily  . [COMPLETED]  morphine injection  4 mg Intravenous Once  . multivitamin with minerals  1 tablet Oral Daily  . pantoprazole  40 mg Oral Daily  . [COMPLETED] potassium chloride  10 mEq Intravenous Q1 Hr x 4  . pyridOXINE  100 mg Oral Daily  . sodium bicarbonate  650 mg Oral BID  . vitamin C  500 mg Oral Daily  . [DISCONTINUED] aspirin  81 mg Oral Daily  . [DISCONTINUED] calcium citrate-vitamin D  1 tablet Oral Daily  . [DISCONTINUED] multivitamin  1 tablet Oral Daily   Continuous Infusions:   . sodium chloride 75 mL/hr at 05/27/12 2054    Principal Problem:  *Hyponatremia Active Problems:   Normocytic anemia  Hypertension  Chronic kidney disease  CHF (congestive heart failure)  Dementia  Nausea & vomiting      Correll Denbow  [facility] Pager [phone] If 8PM-8AM, please contact night-coverage at [email or web address], password [facility] 05/28/2012, 8:09 AM  LOS: 1 day

## 2012-05-28 NOTE — Progress Notes (Signed)
Utilization review completed.  

## 2012-05-29 ENCOUNTER — Other Ambulatory Visit (HOSPITAL_COMMUNITY): Payer: Self-pay | Admitting: *Deleted

## 2012-05-29 DIAGNOSIS — R109 Unspecified abdominal pain: Secondary | ICD-10-CM

## 2012-05-29 DIAGNOSIS — F0390 Unspecified dementia without behavioral disturbance: Secondary | ICD-10-CM

## 2012-05-29 DIAGNOSIS — K219 Gastro-esophageal reflux disease without esophagitis: Secondary | ICD-10-CM

## 2012-05-29 LAB — BASIC METABOLIC PANEL
BUN: 25 mg/dL — ABNORMAL HIGH (ref 6–23)
BUN: 25 mg/dL — ABNORMAL HIGH (ref 6–23)
Calcium: 8.3 mg/dL — ABNORMAL LOW (ref 8.4–10.5)
Calcium: 8.1 mg/dL — ABNORMAL LOW (ref 8.4–10.5)
Creatinine, Ser: 1.61 mg/dL — ABNORMAL HIGH (ref 0.50–1.10)
Creatinine, Ser: 1.63 mg/dL — ABNORMAL HIGH (ref 0.50–1.10)
GFR calc Af Amer: 36 mL/min — ABNORMAL LOW (ref >90)
GFR calc Af Amer: 35 mL/min — ABNORMAL LOW (ref >90)
GFR calc non Af Amer: 31 mL/min — ABNORMAL LOW (ref >90)
Glucose, Bld: 89 mg/dL (ref 70–99)

## 2012-05-29 LAB — GLUCOSE, CAPILLARY: Glucose-Capillary: 134 mg/dL — ABNORMAL HIGH (ref 70–99)

## 2012-05-29 NOTE — Progress Notes (Signed)
[facility] PROGRESS NOTE  [name] UJW:[id number] DOB: 22-Feb-1941 DOA: 05/27/2012 PCP: Malissa Hippo, MD  Assessment/Plan: *Hyponatremia  -in a patient with h/o CHF and CKD - but without clinical anasarca -  - suspect hypovolemic hyponatremia more likely prerenal etiology/volume depletion - from Gi illness  - started iv NS at 75 cc hr on admission and received iv fluids from 12/3 until 12/5.  - we held lasix all admission, serial BMETs indicating a nice steady rise of sodium  - TSH - OK   Pyuria and bacteriuria- 60 k GNRs - doubt she needs antibiotics as she is asymptomatic   Nausea & vomiting/abd pain- resolving, possibly viral gastroenteritiis, given her recent URI  -also obtain UA, lipase, follow and further manage accordingly  -supportive care   Hypertension  -continue outpt meds, follow    Chronic kidney disease  Creatinine at baseline   CHF (congestive heart failure)  -compensated, monitor fluid status closely with hydration  -follow and consider restarting lasix when clinically appropriate   H/o Normocytic anemia  -likely secondary to CKD  - Check CBC and follow - no signs of bleeding   Rash  - unclear etiology, symptom management.   URI  -check cxr follow, symptom management for now  - she is s/p flu shot about 1week ago.    Code Status: full Family Communication: patient  Disposition Plan: home    HPI/Subjective: Feels better   Objective: Filed Vitals:   05/28/12 1801 05/28/12 2034 05/29/12 0437 05/29/12 0938  BP: 148/78 121/48 110/59 129/55  Pulse: 70 68 65 67  Temp: 98 F (36.7 C) 98.2 F (36.8 C) 98.1 F (36.7 C) 98.7 F (37.1 C)  TempSrc: Oral Oral Oral Oral  Resp: 18 18 18 18   Height:  5\' 5"  (1.651 m)    Weight:  57.153 kg (126 lb)    SpO2: 98% 100% 98% 98%   Patient Vitals for the past 24 hrs:  BP Temp Temp src Pulse Resp SpO2 Height Weight  05/29/12 0938 129/55 mmHg 98.7 F (37.1 C) Oral 67  18  98 % - -  05/29/12  0437 110/59 mmHg 98.1 F (36.7 C) Oral 65  18  98 % - -  05/28/12 2034 121/48 mmHg 98.2 F (36.8 C) Oral 68  18  100 % 5\' 5"  (1.651 m) 57.153 kg (126 lb)  05/28/12 1801 148/78 mmHg 98 F (36.7 C) Oral 70  18  98 % - -     Intake/Output Summary (Last 24 hours) at 05/29/12 1416 Last data filed at 05/29/12 0850  Gross per 24 hour  Intake 1923.75 ml  Output   2377 ml  Net -453.25 ml   Filed Weights   05/27/12 1721 05/28/12 2034  Weight: 57.153 kg (126 lb) 57.153 kg (126 lb)    Exam:   General:  axox3  Cardiovascular: rrr, no m,r,g  Respiratory: ctab, no w,r,c   Abdomen: soft, nt, bs present   Data Reviewed: Basic Metabolic Panel:  Lab 05/29/12 5621 05/28/12 2151 05/28/12 1424 05/28/12 0709 05/27/12 2031  NA 128* 125* 118* 117* 112*  K 4.4 4.3 4.2 3.8 2.9*  CL 95* 92* 85* 85* 75*  CO2 21 23 20 23 24   GLUCOSE 89 114* 112* 88 107*  BUN 25* 27* 19 17 17   CREATININE 1.61* 1.64* 1.80* 1.57* 1.38*  CALCIUM 8.3* 8.5 8.7 8.1* 8.9  MG -- -- -- -- --  PHOS -- -- -- -- --   Liver Function Tests:  No results found for this basename: AST:5,ALT:5,ALKPHOS:5,BILITOT:5,PROT:5,ALBUMIN:5 in the last 168 hours  Lab 05/27/12 2031  LIPASE 59  AMYLASE --   No results found for this basename: AMMONIA:5 in the last 168 hours CBC:  Lab 05/28/12 0709 05/27/12 2031  WBC 7.3 11.3*  NEUTROABS -- --  HGB 10.7* 11.4*  HCT 29.4* 31.2*  MCV 90.7 88.9  PLT 259 275   Cardiac Enzymes: No results found for this basename: CKTOTAL:5,CKMB:5,CKMBINDEX:5,TROPONINI:5 in the last 168 hours BNP (last 3 results) No results found for this basename: PROBNP:3 in the last 8760 hours CBG: No results found for this basename: GLUCAP:5 in the last 168 hours  Recent Results (from the past 240 hour(s))  URINE CULTURE     Status: Normal (Preliminary result)   Collection Time   05/27/12  9:22 PM      Component Value Range Status Comment   Specimen Description URINE, SUPRAPUBIC   Final    Special Requests  NONE   Final    Culture  Setup Time 05/27/2012 22:03   Final    Colony Count 60,000 COLONIES/ML   Final    Culture GRAM NEGATIVE RODS   Final    Report Status PENDING   Incomplete      Studies: Portable Chest 1 View  05/27/2012  *RADIOLOGY REPORT*  Clinical Data: Rule out infiltrate  PORTABLE CHEST - 1 VIEW  Comparison: 01/16/2012  Findings: Lungs are clear without infiltrate or effusion.  Negative for heart failure or mass lesion.  IMPRESSION: No active cardiopulmonary disease.   Original Report Authenticated By: Janeece Riggers, M.D.    Dg Abd Portable 2v  05/27/2012  *RADIOLOGY REPORT*  Clinical Data: Nausea and vomiting for 1 week; left-sided abdominal pain and leg cramps.  PORTABLE ABDOMEN - 2 VIEW  Comparison: Abdominal radiograph performed 05/19/2010  Findings: The visualized bowel gas pattern is unremarkable.  The colon is partially filled with air; no abnormal dilatation of small bowel loops is seen to suggest small bowel obstruction.  No free intra-abdominal air is identified on the provided decubitus view.  The patient's left hip arthroplasty is incompletely imaged, but appears grossly unremarkable.  Soft tissue calcifications are again seen overlying the right flank.  The visualized lung bases are grossly clear, though not well characterized.  IMPRESSION: Unremarkable bowel gas pattern; no free intra-abdominal air seen.   Original Report Authenticated By: Tonia Ghent, M.D.     Scheduled Meds:    . ALPRAZolam  0.5 mg Oral TID  . aspirin EC  81 mg Oral Daily  . calcium-vitamin D  1 tablet Oral Daily  . carvedilol  25 mg Oral BID WC  . doxepin  50 mg Oral QHS  . enoxaparin (LOVENOX) injection  30 mg Subcutaneous Q24H  . ferrous gluconate  325 mg Oral Q breakfast  . hydrALAZINE  10 mg Oral TID  . HYDROcodone-acetaminophen  1 tablet Oral BID  . loratadine  10 mg Oral Daily  . multivitamin with minerals  1 tablet Oral Daily  . pantoprazole  40 mg Oral Daily  . pyridOXINE  100 mg  Oral Daily  . sodium bicarbonate  650 mg Oral BID  . vitamin C  500 mg Oral Daily   Continuous Infusions:    . [DISCONTINUED] sodium chloride 75 mL/hr at 05/29/12 4010    Principal Problem:  *Hyponatremia Active Problems:  Normocytic anemia  Hypertension  Chronic kidney disease  CHF (congestive heart failure)  Dementia  Nausea & vomiting  Seleena Reimers  [facility] Pager [phone] If 8PM-8AM, please contact night-coverage at [email or web address], password [facility] 05/29/2012, 2:16 PM  LOS: 2 days

## 2012-05-29 NOTE — Evaluation (Signed)
Physical Therapy Evaluation Patient Details Name: [name] MRN: [id number] DOB: [birth year] Today's Date: 05/29/2012 Time: 7829-5621 PT Time Calculation (min): 26 min  PT Assessment / Plan / Recommendation Clinical Impression  pt presents with Hyponatremia and N/V.  Lengthy discussion with pt and husband about D/C plans as pt's husband noting he is unable to provide needed A for pt at D/C.  Discussed that pt would benefit from SNF and plan is to D/C to [facility] in [place] where pt's daughter is a SW.  Made MD and SW aware.      PT Assessment  Patient needs continued PT services    Follow Up Recommendations  SNF    Does the patient have the potential to tolerate intense rehabilitation      Barriers to Discharge None      Equipment Recommendations  None recommended by PT    Recommendations for Other Services OT consult   Frequency Min 3X/week    Precautions / Restrictions Precautions Precautions: Fall Restrictions Weight Bearing Restrictions: No   Pertinent Vitals/Pain Denies pain.      Mobility  Bed Mobility Bed Mobility: Not assessed Transfers Transfers: Sit to Stand;Stand to Sit Sit to Stand: 4: Min assist;With upper extremity assist;From chair/3-in-1;With armrests Stand to Sit: 4: Min assist;With upper extremity assist;To chair/3-in-1;With armrests Details for Transfer Assistance: Cues for use of UEs and to control descent to chair.   Ambulation/Gait Ambulation/Gait Assistance: 4: Min assist Ambulation Distance (Feet): 100 Feet Assistive device: Straight cane Ambulation/Gait Assistance Details: pt generally unsteady requiring MinA to prevent falls with 3 LOB.  pt with multiple staggering side steps attempting to maintain balance.   Gait Pattern: Step-through pattern;Decreased stride length;Narrow base of support Stairs: No Wheelchair Mobility Wheelchair Mobility: No    Shoulder Instructions     Exercises     PT Diagnosis: Difficulty  walking;Generalized weakness  PT Problem List: Decreased strength;Decreased activity tolerance;Decreased balance;Decreased mobility;Decreased knowledge of use of DME PT Treatment Interventions: DME instruction;Gait training;Stair training;Functional mobility training;Therapeutic activities;Therapeutic exercise;Balance training;Patient/family education   PT Goals Acute Rehab PT Goals PT Goal Formulation: With patient/family Time For Goal Achievement: 06/12/12 Potential to Achieve Goals: Good Pt will go Supine/Side to Sit: with modified independence PT Goal: Supine/Side to Sit - Progress: Goal set today Pt will go Sit to Supine/Side: with modified independence PT Goal: Sit to Supine/Side - Progress: Goal set today Pt will go Sit to Stand: with modified independence PT Goal: Sit to Stand - Progress: Goal set today Pt will go Stand to Sit: with modified independence PT Goal: Stand to Sit - Progress: Goal set today Pt will Ambulate: >150 feet;with modified independence;with least restrictive assistive device PT Goal: Ambulate - Progress: Goal set today Pt will Go Up / Down Stairs: 1-2 stairs;with modified independence;with least restrictive assistive device PT Goal: Up/Down Stairs - Progress: Goal set today  Visit Information  Last PT Received On: 05/29/12 Assistance Needed: +1    Subjective Data  Subjective: Per Husband- "I can't take care of you at home."   Patient Stated Goal: Get better.     Prior Functioning  Home Living Lives With: Spouse Available Help at Discharge: Family;Available 24 hours/day (Minimal ability to A secondary to back surgeries.  ) Type of Home: House Home Access: Stairs to enter [employer] of Steps: 1 Home Layout: One level Bathroom Shower/Tub: Engineer, manufacturing systems: Standard Bathroom Accessibility: Yes How Accessible: Accessible via walker Home Adaptive Equipment: Bedside commode/3-in-1;Grab bars in shower;Shower chair with  back;Walker - rolling;Straight cane Additional Comments: pt uses Cane mostly.   Prior Function Level of Independence: Needs assistance Needs Assistance: Meal Prep;Light Housekeeping;Bathing Bath: Minimal Meal Prep: Total Light Housekeeping: Total Able to Take Stairs?: Yes Driving: No Vocation: Retired Musician: No difficulties    Cognition  Overall Cognitive Status: Appears within functional limits for tasks assessed/performed Arousal/Alertness: Awake/alert Orientation Level: Appears intact for tasks assessed Behavior During Session: [facility] for tasks performed    Extremity/Trunk Assessment Right Lower Extremity Assessment RLE ROM/Strength/Tone: Deficits RLE ROM/Strength/Tone Deficits: Generally weak, 4/5 RLE Sensation: WFL - Light Touch Left Lower Extremity Assessment LLE ROM/Strength/Tone: Deficits LLE ROM/Strength/Tone Deficits: Generally weak, 4/5 LLE Sensation: WFL - Light Touch Trunk Assessment Trunk Assessment: Normal   Balance Balance Balance Assessed: Yes Static Standing Balance Static Standing - Balance Support: Right upper extremity supported Static Standing - Level of Assistance: 4: Min assist Static Standing - Comment/# of Minutes: Unsteady and requiring A to maintain balance.    End of Session PT - End of Session Equipment Utilized During Treatment: Gait belt Activity Tolerance: Patient tolerated treatment well Patient left: in chair;with call bell/phone within reach;with family/visitor present Nurse Communication: Mobility status  GP     Sunny Schlein, [place] [phone] 05/29/2012, 11:25 AM

## 2012-05-30 ENCOUNTER — Encounter (HOSPITAL_COMMUNITY): Payer: Medicare Other

## 2012-05-30 ENCOUNTER — Encounter (HOSPITAL_COMMUNITY): Payer: Self-pay | Admitting: Internal Medicine

## 2012-05-30 LAB — GLUCOSE, CAPILLARY: Glucose-Capillary: 108 mg/dL — ABNORMAL HIGH (ref 70–99)

## 2012-05-30 MED ORDER — HYDROCODONE-ACETAMINOPHEN 5-325 MG PO TABS: 1.0000 | ORAL_TABLET | Freq: Three times a day (TID) | ORAL | Status: DC | PRN

## 2012-05-30 MED ORDER — ALPRAZOLAM 0.5 MG PO TABS: 0.5000 mg | ORAL_TABLET | Freq: Three times a day (TID) | ORAL | Status: DC

## 2012-05-30 NOTE — Clinical Social Work Psychosocial (Signed)
Clinical Social Work Department BRIEF PSYCHOSOCIAL ASSESSMENT 05/30/2012  Patient:  [name], [name]     Account Number:  [id number]     Admit date:  05/27/2012  Clinical Social Worker:  [name]  Date/Time:  05/30/2012 10:49 AM  Referred by:  Physician  Date Referred:  05/29/2012 Referred for  SNF Placement   Other Referral:   Interview type:  Family Other interview type:   CSW talked with husband [name] [phone])    PSYCHOSOCIAL DATA Living Status:  HUSBAND Admitted from facility:   Level of care:   Primary support name:  [name] Primary support relationship to patient:  SPOUSE Degree of support available:   Husband is primary care provider. Couple also have a daughter.    CURRENT CONCERNS Current Concerns  Post-Acute Placement   Other Concerns:    SOCIAL WORK ASSESSMENT / PLAN On 05/29/12 CSW received call from [name] regarding his wife. Husband stated that wife is wobbly and he can't handle her right now and they want [name]. When asked for a back-up choice, [name] was unable to give one, stating that he does not like [name]   Assessment/plan status:  Psychosocial Support/Ongoing Assessment of Needs Other assessment/ plan:   Information/referral to community resources:    PATIENT'S/FAMILY'S RESPONSE TO PLAN OF CARE: [name] expressed appreciation for CSW's assistance and he was confident that [name] would be able to take his wife. CSW advised that he would be kept updated.

## 2012-05-30 NOTE — Discharge Summary (Signed)
Physician Discharge Summary  [name] HYQ:[id number] DOB: 05/13/1941 DOA: 05/27/2012  PCP: Malissa Hippo, MD  Admit date: 05/27/2012 Discharge date: 05/30/2012  Time spent: 35 minutes  Recommendations for Outpatient Follow-up:  1. Follow up BMET on 06/02/12 to assure sodium level stays up 2. Patient should be allowed to eat a regular diet with normal amount of salt.   Discharge Diagnoses:  Hypovolemic Hyponatremia - improved with iv fluids and stop lasix Asymptomatic bacteriuria   Normocytic anemia  Hypertension  Chronic kidney disease stage 3  CHF (congestive heart failure)  - asymptomatic   Nausea & vomiting - resolved - patient tolerating a regular diet  S/p nephrectomy, bladder removal with Right urostomy/ileal conduit    Discharge Condition: fair to snf  Diet recommendation: regular   Filed Weights   05/27/12 1721 05/28/12 2034 05/29/12 2122  Weight: 57.153 kg (126 lb) 57.153 kg (126 lb) 57.3 kg (126 lb 5.2 oz)    History of present illness:  [name] is a 71 y.o. female with h/o CKD followed by Dr Caryn Section, ileal conduit, CHF, HTN, chronic anemia who went for a follow up appt today with Dr Caryn Section and labs revealed a Na of120 with Cr of 1.9 - per renal her last Cr was 2.6 prior to this( per epic cr in 07/13 was1.37). Pt was directly admitted from the office for further evaluation and management. Pt states that 8days ago she got her flu shot and shortly thereafter she developed URI symptoms and hoarseness as well. Then 3days ago she began having nausea and vomiting- nonbloody, associated with abdominal pain- mostly in her LLQ. Per her husband she was vomiting anything she tried to eat or drink through yesterday. Pt states she has not vomited today. She denies diarrhea, fevers, melena and no hematochezia  Pt has not had any confusion or seizure like activity per husband.  She states she continued to take her meds including lasix   Hospital Course:   1.Hyponatremia   -in a patient with h/o CHF and CKD - but without clinical anasarca -  -we  suspect hypovolemic hyponatremia more likely prerenal etiology/volume depletion - from GI illness and ileal conduit polyuria  - patient started iv NS at 75 cc hr on admission and received iv fluids from 12/3 until 12/5. Sodium improved from 117 on admission to 128 at D[facility] and symptoms resolved. Patient sodium level remained steady even after stopping iv fluids. Patient was encouraged to eat a regular diet and drink gatorade or v 8 juice. Patient will have a repeat bmet at snf on Monday 12/9 - TSH - OK   2. Pyuria and bacteriuria- 60 k GNRs - doubt she needs antibiotics as she is asymptomatic . She was not febrile during admission.   3. Nausea & vomiting/abd pain- resolving, possibly viral gastroenteritiis, given her recent URI . Resolved with correction of hyponatremia   4. Hypertension  -continued outpt meds and held lasix  5. Chronic kidney disease  Creatinine at baseline   6. CHF (congestive heart failure)  - patient seems now   7. Normocytic anemia  -likely secondary to CKD  - hemoglobin stable during admission . Minimal dilutional drop. No signs of bleeding noted for 3 days       Discharge Exam: Filed Vitals:   05/29/12 1745 05/29/12 2122 05/30/12 0600 05/30/12 0925  BP: 114/65 146/57 152/53   Pulse: 87 73 63 72  Temp: 98.2 F (36.8 C) 98 F (36.7 C) 98.1 F (36.7 C)  98.1 F (36.7 C)  TempSrc: Oral Oral Oral   Resp: 18 18 18 17   Height:      Weight:  57.3 kg (126 lb 5.2 oz)    SpO2: 98% 96% 99% 99%    General: axox3 Cardiovascular: rrr Respiratory: ctab   Discharge Instructions  Discharge Orders    Future Appointments: Provider: Department: Dept Phone: Center:   05/30/2012 12:00 PM [facility] [phone] None   08/05/2012 11:30 AM Malissa Hippo, MD [facility] [phone] None     Future Orders Please Complete  By Expires   Diet general      Increase activity slowly          Medication List     As of 05/30/2012 11:24 AM    STOP taking these medications         furosemide 40 MG tablet   Commonly known as: LASIX      TAKE these medications         alendronate 70 MG tablet   Commonly known as: FOSAMAX   Take 70 mg by mouth every 7 (seven) days. Take with a full glass of water on an empty stomach. Takes on Sunday.      ALPRAZolam 0.5 MG tablet   Commonly known as: XANAX   Take 1 tablet (0.5 mg total) by mouth 3 (three) times daily.      aspirin 81 MG tablet   Take 81 mg by mouth daily.      calcium citrate-vitamin D 200-200 MG-UNIT Tabs   Take 1 tablet by mouth daily.      carvedilol 25 MG tablet   Commonly known as: COREG   Take 1 tablet (25 mg total) by mouth 2 (two) times daily with a meal.      doxepin 50 MG capsule   Commonly known as: SINEQUAN   Take 50 mg by mouth at bedtime.      ferrous gluconate 325 MG tablet   Commonly known as: FERGON   Take 325 mg by mouth daily with breakfast.      fish oil-omega-3 fatty acids 1000 MG capsule   Take 1 g by mouth 4 (four) times daily.      hydrALAZINE 10 MG tablet   Commonly known as: APRESOLINE   Take 1 tablet (10 mg total) by mouth 3 (three) times daily.      HYDROcodone-acetaminophen 5-325 MG per tablet   Commonly known as: NORCO/VICODIN   Take 1 tablet by mouth every 8 (eight) hours as needed for pain.      multivitamin tablet   Take 1 tablet by mouth daily.      nitroGLYCERIN 0.4 MG SL tablet   Commonly known as: NITROSTAT   Place 0.4 mg under the tongue every 5 (five) minutes as needed. For chest pain      omeprazole 20 MG capsule   Commonly known as: PRILOSEC   Take 1 capsule (20 mg total) by mouth daily.      pyridOXINE 100 MG tablet   Commonly known as: VITAMIN B-6   Take 100 mg by mouth daily.      sodium bicarbonate 650 MG tablet   Take 1,300 mg by mouth 2 (two) times daily.      vitamin C 500 MG  tablet   Commonly known as: ASCORBIC ACID   Take 500 mg by mouth daily.  Follow-up Information    Follow up with Malissa Hippo, MD.   Contact information:   [address], [address] [place] [address] [phone]       Follow up with snf md.          The results of significant diagnostics from this hospitalization (including imaging, microbiology, ancillary and laboratory) are listed below for reference.    Significant Diagnostic Studies: Dg Thoracic Spine 2 View  05/05/2012  *RADIOLOGY REPORT*  Clinical Data: Interscapular pain  THORACIC SPINE - 2 VIEW  Comparison: 01/16/2012  Findings: Anatomic alignment.  Mild upper thoracic compression deformity is stable.  Osteopenia.  No new compression fracture.  IMPRESSION: No acute bony pathology.   Original Report Authenticated By: Jolaine Click, M.D.    Portable Chest 1 View  05/27/2012  *RADIOLOGY REPORT*  Clinical Data: Rule out infiltrate  PORTABLE CHEST - 1 VIEW  Comparison: 01/16/2012  Findings: Lungs are clear without infiltrate or effusion.  Negative for heart failure or mass lesion.  IMPRESSION: No active cardiopulmonary disease.   Original Report Authenticated By: Janeece Riggers, M.D.    Dg Abd Portable 2v  05/27/2012  *RADIOLOGY REPORT*  Clinical Data: Nausea and vomiting for 1 week; left-sided abdominal pain and leg cramps.  PORTABLE ABDOMEN - 2 VIEW  Comparison: Abdominal radiograph performed 05/19/2010  Findings: The visualized bowel gas pattern is unremarkable.  The colon is partially filled with air; no abnormal dilatation of small bowel loops is seen to suggest small bowel obstruction.  No free intra-abdominal air is identified on the provided decubitus view.  The patient's left hip arthroplasty is incompletely imaged, but appears grossly unremarkable.  Soft tissue calcifications are again seen overlying the right flank.  The visualized lung bases are grossly clear, though not well characterized.  IMPRESSION:  Unremarkable bowel gas pattern; no free intra-abdominal air seen.   Original Report Authenticated By: Tonia Ghent, M.D.     Microbiology: Recent Results (from the past 240 hour(s))  URINE CULTURE     Status: Normal (Preliminary result)   Collection Time   05/27/12  9:22 PM      Component Value Range Status Comment   Specimen Description URINE, SUPRAPUBIC   Final    Special Requests NONE   Final    Culture  Setup Time 05/27/2012 22:03   Final    Colony Count 60,000 COLONIES/ML   Final    Culture GRAM NEGATIVE RODS   Final    Report Status PENDING   Incomplete      Labs: Basic Metabolic Panel:  Lab 05/29/12 8295 05/29/12 0658 05/28/12 2151 05/28/12 1424 05/28/12 0709  NA 128* 128* 125* 118* 117*  K 4.1 4.4 4.3 4.2 3.8  CL 96 95* 92* 85* 85*  CO2 19 21 23 20 23   GLUCOSE 114* 89 114* 112* 88  BUN 25* 25* 27* 19 17  CREATININE 1.63* 1.61* 1.64* 1.80* 1.57*  CALCIUM 8.1* 8.3* 8.5 8.7 8.1*  MG -- -- -- -- --  PHOS -- -- -- -- --   Liver Function Tests: No results found for this basename: AST:5,ALT:5,ALKPHOS:5,BILITOT:5,PROT:5,ALBUMIN:5 in the last 168 hours  Lab 05/27/12 2031  LIPASE 59  AMYLASE --   No results found for this basename: AMMONIA:5 in the last 168 hours CBC:  Lab 05/28/12 0709 05/27/12 2031  WBC 7.3 11.3*  NEUTROABS -- --  HGB 10.7* 11.4*  HCT 29.4* 31.2*  MCV 90.7 88.9  PLT 259 275   Cardiac Enzymes: No results found  for this basename: CKTOTAL:5,CKMB:5,CKMBINDEX:5,TROPONINI:5 in the last 168 hours BNP: BNP (last 3 results) No results found for this basename: PROBNP:3 in the last 8760 hours CBG:  Lab 05/30/12 0747 05/29/12 2128 05/29/12 1740  GLUCAP 108* 108* 134*       Signed:  Aqeel Norgaard  [facility] 05/30/2012, 11:24 AM

## 2012-05-30 NOTE — Progress Notes (Signed)
05/30/2012 2:44 PM  Called report to Britta Mccreedy, Charity fundraiser at [facility].  IV removed by Synetta Fail, RN.  Ambulance transport called.  Eunice Blase

## 2012-05-30 NOTE — Clinical Social Work Placement (Addendum)
Clinical Social Work Department CLINICAL SOCIAL WORK PLACEMENT NOTE 05/30/2012  Patient:  [name], [name]  Account Number:  [id number] Admit date:  05/27/2012  Clinical Social Worker:  Genelle Bal, LCSW  Date/time:  05/30/2012 10:56 AM  Clinical Social Work is seeking post-discharge placement for this patient at the following level of care:   SKILLED NURSING   (*CSW will update this form in Epic as items are completed)     Patient/family provided with [facility] Department of Clinical Social Work's list of facilities offering this level of care within the geographic area requested by the patient (or if unable, by the patient's family).  05/30/2012  Patient/family informed of their freedom to choose among providers that offer the needed level of care, that participate in Medicare, Medicaid or managed care program needed by the patient, have an available bed and are willing to accept the patient.    Patient/family informed of [facility]' ownership interest in [facility], as well as of the fact that they are under no obligation to receive care at this facility.  PASARR submitted to EDS on  PASARR number received from EDS on   FL2 transmitted to all facilities in geographic area requested by pt/family on  05/29/2012 FL2 transmitted to all facilities within larger geographic area on   Patient informed that his/her managed care company has contracts with or will negotiate with  certain facilities, including the following:     Patient/family informed of bed offers received: 05/30/12  Patient chooses bed at [facility] Physician recommends and patient chooses bed at    Patient to be transferred to [facility] on  05/30/12 Patient to be transferred to facility by ambulance  The following physician request were entered in Epic:  Additional Comments: **Patient has a previous PASARR number 05/30/12 - CSW attempted to reach husband regarding discharge and  ambulance being called. Messages left. 05/30/12 - Discharge information forwarded to facility and medical packet completed and will accompany patient to SNF.

## 2012-05-31 LAB — URINE CULTURE

## 2012-06-03 NOTE — Telephone Encounter (Signed)
Patient is currently in the hospital.

## 2012-06-09 ENCOUNTER — Other Ambulatory Visit: Payer: Self-pay | Admitting: *Deleted

## 2012-06-09 DIAGNOSIS — K219 Gastro-esophageal reflux disease without esophagitis: Secondary | ICD-10-CM

## 2012-06-09 DIAGNOSIS — I509 Heart failure, unspecified: Secondary | ICD-10-CM

## 2012-06-09 MED ORDER — OMEPRAZOLE 20 MG PO CPDR: 20.0000 mg | DELAYED_RELEASE_CAPSULE | Freq: Every day | ORAL | Status: DC

## 2012-06-09 NOTE — Telephone Encounter (Signed)
Fax Received. Refill Completed. [name] (R.M.A)   

## 2012-06-11 ENCOUNTER — Telehealth (INDEPENDENT_AMBULATORY_CARE_PROVIDER_SITE_OTHER): Payer: Self-pay | Admitting: *Deleted

## 2012-06-11 DIAGNOSIS — M546 Pain in thoracic spine: Secondary | ICD-10-CM

## 2012-06-11 DIAGNOSIS — R109 Unspecified abdominal pain: Secondary | ICD-10-CM

## 2012-06-11 DIAGNOSIS — D649 Anemia, unspecified: Secondary | ICD-10-CM

## 2012-06-11 NOTE — Telephone Encounter (Signed)
Patient will need to have her H/H checked in January 2014

## 2012-06-13 ENCOUNTER — Encounter (INDEPENDENT_AMBULATORY_CARE_PROVIDER_SITE_OTHER): Payer: Self-pay

## 2012-06-13 ENCOUNTER — Other Ambulatory Visit: Payer: Self-pay | Admitting: *Deleted

## 2012-06-13 NOTE — Telephone Encounter (Signed)
Received request to refill lasix, after MAR and chart review I see lasix has been held from DC papers from hospital, I called pharmacy and denied refill at this time.

## 2012-06-16 ENCOUNTER — Other Ambulatory Visit: Payer: Self-pay | Admitting: *Deleted

## 2012-06-16 DIAGNOSIS — K219 Gastro-esophageal reflux disease without esophagitis: Secondary | ICD-10-CM

## 2012-06-16 DIAGNOSIS — I509 Heart failure, unspecified: Secondary | ICD-10-CM

## 2012-06-16 MED ORDER — NITROGLYCERIN 0.4 MG SL SUBL: 0.4000 mg | SUBLINGUAL_TABLET | SUBLINGUAL | Status: AC | PRN

## 2012-06-16 MED ORDER — CARVEDILOL 25 MG PO TABS: 25.0000 mg | ORAL_TABLET | Freq: Two times a day (BID) | ORAL | Status: DC

## 2012-06-16 NOTE — Telephone Encounter (Signed)
Opened in Error.

## 2012-06-16 NOTE — Telephone Encounter (Signed)
Fax Received. Refill Completed. [name] (R.M.A)   

## 2012-06-16 NOTE — Telephone Encounter (Signed)
Fax Received. Refill Completed. Gatsby Chismar Chowoe (R.M.A)   

## 2012-06-19 ENCOUNTER — Telehealth: Payer: Self-pay | Admitting: Cardiovascular Disease

## 2012-06-19 DIAGNOSIS — I509 Heart failure, unspecified: Secondary | ICD-10-CM

## 2012-06-19 MED ORDER — HYDRALAZINE HCL 10 MG PO TABS: 10.0000 mg | ORAL_TABLET | Freq: Three times a day (TID) | ORAL | Status: DC

## 2012-06-19 NOTE — Telephone Encounter (Signed)
Fax Received. Refill Completed. [name] (R.M.A). Patient is aware that prescriptions have been sent to her pharmacy.

## 2012-06-19 NOTE — Telephone Encounter (Signed)
Pt needs refill on carvedilol 25mg , hydralazine 10mg ,nitro stat, omeprazole 20mg , sent into [facility]

## 2012-07-02 ENCOUNTER — Encounter (INDEPENDENT_AMBULATORY_CARE_PROVIDER_SITE_OTHER): Payer: Self-pay | Admitting: *Deleted

## 2012-07-02 ENCOUNTER — Telehealth (INDEPENDENT_AMBULATORY_CARE_PROVIDER_SITE_OTHER): Payer: Self-pay | Admitting: *Deleted

## 2012-07-02 DIAGNOSIS — M546 Pain in thoracic spine: Secondary | ICD-10-CM

## 2012-07-02 DIAGNOSIS — R109 Unspecified abdominal pain: Secondary | ICD-10-CM

## 2012-07-02 DIAGNOSIS — D649 Anemia, unspecified: Secondary | ICD-10-CM

## 2012-07-02 NOTE — Telephone Encounter (Signed)
Letter sent to the patient as a reminder of upcoming lab.

## 2012-07-29 ENCOUNTER — Encounter (INDEPENDENT_AMBULATORY_CARE_PROVIDER_SITE_OTHER): Payer: Self-pay

## 2012-08-05 ENCOUNTER — Ambulatory Visit (INDEPENDENT_AMBULATORY_CARE_PROVIDER_SITE_OTHER): Payer: Medicare Other | Admitting: Internal Medicine

## 2012-08-14 ENCOUNTER — Encounter (INDEPENDENT_AMBULATORY_CARE_PROVIDER_SITE_OTHER): Payer: Self-pay

## 2012-10-08 ENCOUNTER — Telehealth: Payer: Self-pay | Admitting: Cardiovascular Disease

## 2012-10-08 NOTE — Telephone Encounter (Signed)
Returned call to patient no answer.Unable to leave message.

## 2012-10-08 NOTE — Telephone Encounter (Signed)
New Prob   Pt has some general questions regarding her medications. She states she is currently only taking 2 meds. Would like to speak to nurse.

## 2012-10-10 NOTE — Telephone Encounter (Signed)
Returned call, no answer/no answering machine.

## 2012-10-21 ENCOUNTER — Other Ambulatory Visit (INDEPENDENT_AMBULATORY_CARE_PROVIDER_SITE_OTHER): Payer: Self-pay | Admitting: Internal Medicine

## 2012-10-22 ENCOUNTER — Other Ambulatory Visit (HOSPITAL_COMMUNITY): Payer: Self-pay | Admitting: Nephrology

## 2012-10-22 ENCOUNTER — Other Ambulatory Visit (INDEPENDENT_AMBULATORY_CARE_PROVIDER_SITE_OTHER): Payer: Self-pay | Admitting: Internal Medicine

## 2012-10-22 DIAGNOSIS — N2581 Secondary hyperparathyroidism of renal origin: Secondary | ICD-10-CM

## 2012-10-22 MED ORDER — ALPRAZOLAM 0.5 MG PO TABS: 0.5000 mg | ORAL_TABLET | Freq: Three times a day (TID) | ORAL | Status: DC

## 2012-10-23 ENCOUNTER — Encounter (HOSPITAL_COMMUNITY)
Admission: RE | Admit: 2012-10-23 | Discharge: 2012-10-23 | Disposition: A | Discharge status: Home / Self Care | Payer: Medicare Other | Source: Ambulatory Visit | Attending: Nephrology | Admitting: Nephrology

## 2012-10-23 ENCOUNTER — Encounter (HOSPITAL_COMMUNITY): Payer: Self-pay

## 2012-10-23 DIAGNOSIS — N2581 Secondary hyperparathyroidism of renal origin: Secondary | ICD-10-CM | POA: Insufficient documentation

## 2012-10-23 HISTORY — DX: Systemic involvement of connective tissue, unspecified: M35.9

## 2012-10-23 HISTORY — DX: Disorder of kidney and ureter, unspecified: N28.9

## 2012-10-23 MED ORDER — TECHNETIUM TC 99M SESTAMIBI - CARDIOLITE
25.0000 | Freq: Once | INTRAVENOUS | Status: AC | PRN
  Administered 2012-10-23: 25 via INTRAVENOUS

## 2012-10-27 ENCOUNTER — Encounter (HOSPITAL_COMMUNITY): Payer: Medicare Other | Attending: Internal Medicine

## 2012-10-27 DIAGNOSIS — D649 Anemia, unspecified: Secondary | ICD-10-CM

## 2012-10-27 DIAGNOSIS — D509 Iron deficiency anemia, unspecified: Secondary | ICD-10-CM | POA: Insufficient documentation

## 2012-10-27 MED ORDER — FERUMOXYTOL INJECTION 510 MG/17 ML
510.0000 mg | Freq: Once | INTRAVENOUS | Status: DC
  Filled 2012-10-27: qty 17

## 2012-10-27 NOTE — Progress Notes (Signed)
Staff unable to gain IV access despite multiple attempts, MD notified via message to office.  No response at this time.

## 2012-11-03 ENCOUNTER — Encounter (HOSPITAL_COMMUNITY): Payer: Medicare Other

## 2012-11-03 DIAGNOSIS — D509 Iron deficiency anemia, unspecified: Secondary | ICD-10-CM

## 2012-11-03 MED ORDER — FERUMOXYTOL INJECTION 510 MG/17 ML
510.0000 mg | Freq: Once | INTRAVENOUS | Status: AC
  Administered 2012-11-03: 510 mg via INTRAVENOUS
  Filled 2012-11-03: qty 17

## 2012-11-03 MED ORDER — SODIUM CHLORIDE 0.9 % IV SOLN
INTRAVENOUS | Status: DC
  Administered 2012-11-03: 14:00:00 via INTRAVENOUS

## 2012-11-05 NOTE — Progress Notes (Signed)
Tolerated well

## 2012-11-10 ENCOUNTER — Encounter (HOSPITAL_COMMUNITY): Payer: Medicare Other

## 2012-11-10 ENCOUNTER — Encounter (HOSPITAL_COMMUNITY): Payer: Self-pay

## 2012-11-10 DIAGNOSIS — D509 Iron deficiency anemia, unspecified: Secondary | ICD-10-CM | POA: Insufficient documentation

## 2012-11-10 MED ORDER — FERUMOXYTOL INJECTION 510 MG/17 ML
510.0000 mg | Freq: Once | INTRAVENOUS | Status: DC
  Filled 2012-11-10: qty 17

## 2012-11-10 MED ORDER — SODIUM CHLORIDE 0.9 % IJ SOLN: 10.0000 mL | INTRAMUSCULAR | Status: DC | PRN

## 2012-11-10 NOTE — Progress Notes (Signed)
Attempted IV access x 7. Patient d/c"ed in good condition and will continue to push fluids and will try IV on Thursday of this week.

## 2012-11-13 ENCOUNTER — Encounter (HOSPITAL_COMMUNITY): Payer: Medicare Other

## 2012-11-13 DIAGNOSIS — D509 Iron deficiency anemia, unspecified: Secondary | ICD-10-CM

## 2012-11-13 MED ORDER — FERUMOXYTOL INJECTION 510 MG/17 ML
510.0000 mg | Freq: Once | INTRAVENOUS | Status: AC
  Administered 2012-11-13: 510 mg via INTRAVENOUS
  Filled 2012-11-13: qty 17

## 2012-11-13 MED ORDER — SODIUM CHLORIDE 0.9 % IV SOLN
Freq: Once | INTRAVENOUS | Status: AC
  Administered 2012-11-13: 12:00:00 via INTRAVENOUS

## 2012-11-13 NOTE — Progress Notes (Signed)
Tolerated infusion well. 

## 2012-12-08 ENCOUNTER — Other Ambulatory Visit: Payer: Self-pay | Admitting: Cardiovascular Disease

## 2012-12-08 NOTE — Telephone Encounter (Signed)
NO REFILLS UNTIL APPOINTMENT Fax Received. Refill Completed. Duanna Runk Chowoe (R.M.A)   

## 2012-12-22 ENCOUNTER — Encounter: Payer: Self-pay | Admitting: Cardiovascular Disease

## 2012-12-22 ENCOUNTER — Ambulatory Visit (INDEPENDENT_AMBULATORY_CARE_PROVIDER_SITE_OTHER): Payer: Medicare Other | Admitting: Cardiovascular Disease

## 2012-12-22 VITALS — BP 160/78 | HR 51 | Ht 65.0 in | Wt 127.8 lb

## 2012-12-22 DIAGNOSIS — I509 Heart failure, unspecified: Secondary | ICD-10-CM

## 2012-12-22 DIAGNOSIS — I5022 Chronic systolic (congestive) heart failure: Secondary | ICD-10-CM

## 2012-12-22 DIAGNOSIS — I10 Essential (primary) hypertension: Secondary | ICD-10-CM

## 2012-12-22 MED ORDER — AMLODIPINE BESYLATE 5 MG PO TABS: 5.0000 mg | ORAL_TABLET | Freq: Every day | ORAL | Status: DC

## 2012-12-22 MED ORDER — CARVEDILOL 25 MG PO TABS: 25.0000 mg | ORAL_TABLET | Freq: Two times a day (BID) | ORAL | Status: DC

## 2012-12-22 NOTE — Assessment & Plan Note (Addendum)
[name] is about the same. Her BP is very high and has been high for months.   Her LV function has improved.   She does not eat much salt.  Will add amlodipine 5 mg a day.  Will see her in 6 months.  I have asked her to take her BP daily.

## 2012-12-22 NOTE — Assessment & Plan Note (Signed)
Will add amlodipine 5 mg a day.  Will see her in 6 months.

## 2012-12-22 NOTE — Patient Instructions (Addendum)
Your physician wants you to follow-up in: 6 months  You will receive a reminder letter in the mail two months in advance. If you don't receive a letter, please call our office to schedule the follow-up appointment.  Your physician has recommended you make the following change in your medication:  Start amlodipine 5 mg daily for high blood pressure  Try to check blood pressure daily, call with questions or concerns.

## 2012-12-22 NOTE — Progress Notes (Signed)
[name] Date of Birth  02/08/1941 [facility] [address]    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  She complains of some intermittent episodes of chest pain. She states that it feels like soreness  in the chest. It lasts for several minutes. It causes her some diaphoresis.  She has a history of congestive heart failure. Her ejection fraction in December 2001 was normal. Her most recent ejection fraction is now 25%.  Has a history of chronic renal insufficiency. When I saw her in November, 2011, she had acute renal insufficiency with a creatinine of 5.5. She also had mildly positive cardiac enzymes thought to be to her underlying medical illnesses.  She has a history of chronic renal insufficiency. She's followed by the nephrologists Marina Gravel, MD). She has had several episodes of acute renal failure and several attempts have been made to place an AV fistula in her. Had not been able to place an AV fistula because of her poor veins.   She continues to have some intermittent episodes of chest pain. These occur particularly when she bends over to make the bed.  She does not get any regular exercise. She does bring in the groceries on occasion she does not typically have any chest pain with activity.  Her bedroom is on the first floor of her house so she does not climb stairs.    Is fairly unsteady on her feet. She falls fairly frequently against the walls. She has a large bruise on her left arm.  She complains of being profoundly weak. She doesn't have much chest pain and it does not seem to limit her daily activities.  She appears to be generally stronger today than she was the last time I saw her.  December 22, 2012:  [name] is doing about the same.  She feels weak on occasion.   She has occasional CP.  Typically in  the left side of her chest ( she had to ask her husband about where her pains were) , no dyspnea.  She seems to have some memory issues.  Needs assurance from her husband for most of her answers.    Current Outpatient Prescriptions on File Prior to Visit  Medication Sig Dispense Refill  . alendronate (FOSAMAX) 70 MG tablet Take 70 mg by mouth every 7 (seven) days. Take with a full glass of water on an empty stomach. Takes on Sunday.      . ALPRAZolam (XANAX) 0.5 MG tablet Take 1 tablet (0.5 mg total) by mouth 3 (three) times daily.  90 tablet  0  . aspirin 81 MG tablet Take 81 mg by mouth daily.        . calcium citrate-vitamin D 200-200 MG-UNIT TABS Take 1 tablet by mouth daily.        . carvedilol (COREG) 25 MG tablet TAKE (1) TABLET TWICE A DAY WITH MEALS.  60 tablet  0  . doxepin (SINEQUAN) 50 MG capsule TAKE 1 CAPSULE BY MOUTH AT BEDTIME.  30 capsule  3  . ferrous gluconate (FERGON) 325 MG tablet Take 325 mg by mouth daily with breakfast.        . fish oil-omega-3 fatty acids 1000 MG capsule Take 1 g by mouth 4 (four) times daily.       . hydrALAZINE (APRESOLINE) 10 MG tablet Take  1 tablet (10 mg total) by mouth 3 (three) times daily.  90 tablet  5  . HYDROcodone-acetaminophen (NORCO/VICODIN) 5-325 MG per tablet Take 1 tablet by mouth every 8 (eight) hours as needed for pain.  30 tablet  0  . Multiple Vitamin (MULTIVITAMIN) tablet Take 1 tablet by mouth daily.        . nitroGLYCERIN (NITROSTAT) 0.4 MG SL tablet Place 1 tablet (0.4 mg total) under the tongue every 5 (five) minutes as needed. For chest pain  25 tablet  5  . omeprazole (PRILOSEC) 20 MG capsule Take 1 capsule (20 mg total) by mouth daily.  30 capsule  5  . pyridOXINE (VITAMIN B-6) 100 MG tablet Take 100 mg by mouth daily.      . sodium bicarbonate 650 MG tablet Take 1,300 mg by mouth 2 (two) times daily.       . vitamin C (ASCORBIC ACID) 500 MG tablet Take 500 mg by mouth daily.       No current facility-administered  medications on file prior to visit.  she is not taking the Atenolol  Allergies  Allergen Reactions  . Ketorolac Tromethamine Rash    Past Medical History  Diagnosis Date  . Hypertension   . Chronic kidney disease   . CHF (congestive heart failure)   . Gastroesophageal reflux   . Chronic UTI   . Hydronephrosis     She has chronic mild left   . AV fistula     clotted in her left forearm  . Cellulitis   . Pneumothorax     secondary to central line placement  . Acute systolic heart failure     Again, EF 25-30%  . Single kidney   . Myocardial infarction 2011; 2012  . Numbness and tingling of both legs   . Anginal pain   . Heart murmur   . Dysrhythmia     "heart beats real fast"  . Chronic anemia   . Pneumonia     "one time"  . Chronic chest pain 01/14/12    1-2 X/day/H&P  . Arthritis     "at different places"  . Anxiety   . Depression   . Headache(784.0)     "alot; not daily"  . Frequent falls   . Dementia   . Collagen vascular disease   . Renal insufficiency   . Iron deficiency anemia 11/10/2012    Past Surgical History  Procedure Laterality Date  . Hip arthroplasty      left; S/P fracture  . Rod left leg  2012    left hip "to almost my knee; for my hip"  . Creation / revision of ileostomy / jejunostomy  1973  . Nephrectomy  1973    right  . Fracture surgery    . Partial colectomy  1970  . Tonsillectomy and adenoidectomy      "when I was a kid"  . Cholecystectomy  2000  . Appendectomy  2000  . Abdominal hysterectomy    . Dilation and curettage of uterus    . Cesarean section  1969    only 4th of 4 children was c-section  . Av fistula placement  1970's    LFA  . Bladder removal      with ileal conduit and urostomy    History  Smoking status  . Former Smoker -- 2 years  . Types: Cigarettes  . Quit date: 06/25/2004  Smokeless tobacco  . Never Used    Comment: 01/14/12 "just  smoked one cigarette, once in awhile for ~ 2 years"    History   Alcohol Use No    Family History  Problem Relation Age of Onset  . Heart attack Mother   . Healthy Daughter   . Healthy Daughter   . Healthy Son     Reviw of Systems:  Reviewed in the HPI.  All other systems are negative.  Physical Exam: BP 160/78  Pulse 51  Ht 5\' 5"  (1.651 m)  Wt 127 lb 12.8 oz (57.97 kg)  BMI 21.27 kg/m2  SpO2 98% The patient is alert and oriented x 3.  The mood and affect are normal.   Skin: warm and dry.  She is very weak.  Was very unsteady - used a cane to get up to the exam table   HEENT:   Normal carotids, no JVD  Lungs: clear   Heart: RR, no murmurs    Abdomen: soft, good BS  Extremities:  No leg edema  Neuro:  Her strength has generally improved. When I saw her 6 months ago she was so weak that she could not get up on the exam table. Today she got up and down off the exam table without any significant difficulty. Her speech pattern is fairly clear. Previously, her speech was very slow  ECG:  Assessment / Plan:

## 2013-01-12 ENCOUNTER — Other Ambulatory Visit (INDEPENDENT_AMBULATORY_CARE_PROVIDER_SITE_OTHER): Payer: Self-pay | Admitting: Internal Medicine

## 2013-01-15 ENCOUNTER — Emergency Department (HOSPITAL_COMMUNITY): Payer: Medicare Other

## 2013-01-15 ENCOUNTER — Inpatient Hospital Stay (HOSPITAL_COMMUNITY)
Admission: EM | Admit: 2013-01-15 | Discharge: 2013-01-19 | DRG: 682 | Disposition: A | Discharge status: Skilled Nursing Facility | Payer: Medicare Other | Attending: Internal Medicine | Admitting: Internal Medicine

## 2013-01-15 ENCOUNTER — Encounter (HOSPITAL_COMMUNITY): Payer: Self-pay

## 2013-01-15 DIAGNOSIS — F419 Anxiety disorder, unspecified: Secondary | ICD-10-CM | POA: Diagnosis present

## 2013-01-15 DIAGNOSIS — E872 Acidosis, unspecified: Secondary | ICD-10-CM | POA: Diagnosis present

## 2013-01-15 DIAGNOSIS — I252 Old myocardial infarction: Secondary | ICD-10-CM

## 2013-01-15 DIAGNOSIS — R112 Nausea with vomiting, unspecified: Secondary | ICD-10-CM

## 2013-01-15 DIAGNOSIS — R531 Weakness: Secondary | ICD-10-CM

## 2013-01-15 DIAGNOSIS — B9689 Other specified bacterial agents as the cause of diseases classified elsewhere: Secondary | ICD-10-CM | POA: Diagnosis present

## 2013-01-15 DIAGNOSIS — D509 Iron deficiency anemia, unspecified: Secondary | ICD-10-CM

## 2013-01-15 DIAGNOSIS — D649 Anemia, unspecified: Secondary | ICD-10-CM

## 2013-01-15 DIAGNOSIS — E871 Hypo-osmolality and hyponatremia: Secondary | ICD-10-CM | POA: Diagnosis present

## 2013-01-15 DIAGNOSIS — G92 Toxic encephalopathy: Secondary | ICD-10-CM | POA: Diagnosis present

## 2013-01-15 DIAGNOSIS — N19 Unspecified kidney failure: Secondary | ICD-10-CM

## 2013-01-15 DIAGNOSIS — A498 Other bacterial infections of unspecified site: Secondary | ICD-10-CM | POA: Diagnosis present

## 2013-01-15 DIAGNOSIS — F411 Generalized anxiety disorder: Secondary | ICD-10-CM | POA: Diagnosis present

## 2013-01-15 DIAGNOSIS — E860 Dehydration: Secondary | ICD-10-CM

## 2013-01-15 DIAGNOSIS — R109 Unspecified abdominal pain: Secondary | ICD-10-CM

## 2013-01-15 DIAGNOSIS — N183 Chronic kidney disease, stage 3 unspecified: Secondary | ICD-10-CM | POA: Diagnosis present

## 2013-01-15 DIAGNOSIS — F0390 Unspecified dementia without behavioral disturbance: Secondary | ICD-10-CM

## 2013-01-15 DIAGNOSIS — Z79899 Other long term (current) drug therapy: Secondary | ICD-10-CM

## 2013-01-15 DIAGNOSIS — I10 Essential (primary) hypertension: Secondary | ICD-10-CM | POA: Diagnosis present

## 2013-01-15 DIAGNOSIS — M546 Pain in thoracic spine: Secondary | ICD-10-CM

## 2013-01-15 DIAGNOSIS — I959 Hypotension, unspecified: Secondary | ICD-10-CM | POA: Diagnosis present

## 2013-01-15 DIAGNOSIS — I509 Heart failure, unspecified: Secondary | ICD-10-CM | POA: Diagnosis present

## 2013-01-15 DIAGNOSIS — N179 Acute kidney failure, unspecified: Principal | ICD-10-CM | POA: Diagnosis present

## 2013-01-15 DIAGNOSIS — E876 Hypokalemia: Secondary | ICD-10-CM | POA: Diagnosis present

## 2013-01-15 DIAGNOSIS — G4700 Insomnia, unspecified: Secondary | ICD-10-CM

## 2013-01-15 DIAGNOSIS — I5022 Chronic systolic (congestive) heart failure: Secondary | ICD-10-CM

## 2013-01-15 DIAGNOSIS — G929 Unspecified toxic encephalopathy: Secondary | ICD-10-CM | POA: Diagnosis present

## 2013-01-15 DIAGNOSIS — N390 Urinary tract infection, site not specified: Secondary | ICD-10-CM | POA: Diagnosis present

## 2013-01-15 DIAGNOSIS — W19XXXA Unspecified fall, initial encounter: Secondary | ICD-10-CM

## 2013-01-15 DIAGNOSIS — Z905 Acquired absence of kidney: Secondary | ICD-10-CM

## 2013-01-15 DIAGNOSIS — N189 Chronic kidney disease, unspecified: Secondary | ICD-10-CM

## 2013-01-15 DIAGNOSIS — I5043 Acute on chronic combined systolic (congestive) and diastolic (congestive) heart failure: Secondary | ICD-10-CM | POA: Diagnosis present

## 2013-01-15 DIAGNOSIS — I129 Hypertensive chronic kidney disease with stage 1 through stage 4 chronic kidney disease, or unspecified chronic kidney disease: Secondary | ICD-10-CM | POA: Diagnosis present

## 2013-01-15 DIAGNOSIS — R5381 Other malaise: Secondary | ICD-10-CM | POA: Diagnosis present

## 2013-01-15 DIAGNOSIS — D631 Anemia in chronic kidney disease: Secondary | ICD-10-CM | POA: Diagnosis present

## 2013-01-15 DIAGNOSIS — F329 Major depressive disorder, single episode, unspecified: Secondary | ICD-10-CM

## 2013-01-15 DIAGNOSIS — K219 Gastro-esophageal reflux disease without esophagitis: Secondary | ICD-10-CM

## 2013-01-15 HISTORY — DX: Other artificial openings of urinary tract status: Z93.6

## 2013-01-15 LAB — CBC WITH DIFFERENTIAL/PLATELET
Hemoglobin: 10.6 g/dL — ABNORMAL LOW (ref 12.0–15.0)
Lymphs Abs: 2.1 10*3/uL (ref 0.7–4.0)
MCH: 33.1 pg (ref 26.0–34.0)
Monocytes Relative: 7 % (ref 3–12)
Neutro Abs: 6.1 10*3/uL (ref 1.7–7.7)
Neutrophils Relative %: 68 % (ref 43–77)
RBC: 3.2 MIL/uL — ABNORMAL LOW (ref 3.87–5.11)

## 2013-01-15 LAB — COMPREHENSIVE METABOLIC PANEL
Alkaline Phosphatase: 55 U/L (ref 39–117)
BUN: 74 mg/dL — ABNORMAL HIGH (ref 6–23)
CO2: 13 mEq/L — ABNORMAL LOW (ref 19–32)
Chloride: 100 mEq/L (ref 96–112)
GFR calc Af Amer: 18 mL/min — ABNORMAL LOW (ref >90)
Glucose, Bld: 86 mg/dL (ref 70–99)
Potassium: 3.1 mEq/L — ABNORMAL LOW (ref 3.5–5.1)
Total Bilirubin: 0.2 mg/dL — ABNORMAL LOW (ref 0.3–1.2)

## 2013-01-15 LAB — URINALYSIS, ROUTINE W REFLEX MICROSCOPIC
Bilirubin Urine: NEGATIVE
Ketones, ur: NEGATIVE mg/dL
Nitrite: NEGATIVE
Urobilinogen, UA: 0.2 mg/dL (ref 0.0–1.0)
pH: 7.5 (ref 5.0–8.0)

## 2013-01-15 LAB — RAPID URINE DRUG SCREEN, HOSP PERFORMED
Barbiturates: NOT DETECTED
Cocaine: NOT DETECTED
Tetrahydrocannabinol: NOT DETECTED

## 2013-01-15 LAB — LACTIC ACID, PLASMA: Lactic Acid, Venous: 0.6 mmol/L (ref 0.5–2.2)

## 2013-01-15 LAB — URINE MICROSCOPIC-ADD ON

## 2013-01-15 LAB — AMMONIA: Ammonia: 36 umol/L (ref 11–60)

## 2013-01-15 MED ORDER — NITROGLYCERIN 0.4 MG SL SUBL: 0.4000 mg | SUBLINGUAL_TABLET | SUBLINGUAL | Status: DC | PRN

## 2013-01-15 MED ORDER — DEXTROSE 5 % IV SOLN
1.0000 g | INTRAVENOUS | Status: DC
  Administered 2013-01-16 – 2013-01-18 (×3): 1 g via INTRAVENOUS
  Filled 2013-01-15 (×4): qty 10

## 2013-01-15 MED ORDER — ACETAMINOPHEN 325 MG PO TABS
650.0000 mg | ORAL_TABLET | Freq: Four times a day (QID) | ORAL | Status: DC | PRN
  Administered 2013-01-17 – 2013-01-19 (×2): 650 mg via ORAL
  Filled 2013-01-15 (×2): qty 2

## 2013-01-15 MED ORDER — HEPARIN SODIUM (PORCINE) 5000 UNIT/ML IJ SOLN
5000.0000 [IU] | Freq: Three times a day (TID) | INTRAMUSCULAR | Status: DC
  Administered 2013-01-16 – 2013-01-19 (×9): 5000 [IU] via SUBCUTANEOUS
  Filled 2013-01-15 (×14): qty 1

## 2013-01-15 MED ORDER — SODIUM CHLORIDE 0.9 % IV SOLN
INTRAVENOUS | Status: AC
  Administered 2013-01-15 – 2013-01-16 (×2): via INTRAVENOUS

## 2013-01-15 MED ORDER — PANTOPRAZOLE SODIUM 40 MG PO TBEC
40.0000 mg | DELAYED_RELEASE_TABLET | Freq: Every day | ORAL | Status: DC
  Administered 2013-01-16 – 2013-01-19 (×4): 40 mg via ORAL
  Filled 2013-01-15 (×4): qty 1

## 2013-01-15 MED ORDER — OMEGA-3-ACID ETHYL ESTERS 1 G PO CAPS
1.0000 g | ORAL_CAPSULE | Freq: Three times a day (TID) | ORAL | Status: DC
  Administered 2013-01-16 – 2013-01-19 (×14): 1 g via ORAL
  Filled 2013-01-15 (×19): qty 1

## 2013-01-15 MED ORDER — DEXTROSE 5 % IV SOLN
1.0000 g | Freq: Once | INTRAVENOUS | Status: AC
  Administered 2013-01-15: 1 g via INTRAVENOUS
  Filled 2013-01-15: qty 10

## 2013-01-15 MED ORDER — ADULT MULTIVITAMIN W/MINERALS CH
1.0000 | ORAL_TABLET | Freq: Every day | ORAL | Status: DC
  Administered 2013-01-16: 1 via ORAL
  Filled 2013-01-15 (×2): qty 1

## 2013-01-15 MED ORDER — SODIUM BICARBONATE 650 MG PO TABS
1300.0000 mg | ORAL_TABLET | Freq: Two times a day (BID) | ORAL | Status: DC
  Administered 2013-01-16 – 2013-01-19 (×8): 1300 mg via ORAL
  Filled 2013-01-15 (×9): qty 2

## 2013-01-15 MED ORDER — OMEGA-3 FATTY ACIDS 1000 MG PO CAPS: 1.0000 g | ORAL_CAPSULE | Freq: Four times a day (QID) | ORAL | Status: DC

## 2013-01-15 MED ORDER — CALCIUM CARBONATE-VITAMIN D 500-200 MG-UNIT PO TABS
1.0000 | ORAL_TABLET | Freq: Every day | ORAL | Status: DC
  Administered 2013-01-16: 1 via ORAL
  Filled 2013-01-15 (×2): qty 1

## 2013-01-15 MED ORDER — DOXEPIN HCL 50 MG PO CAPS
50.0000 mg | ORAL_CAPSULE | Freq: Every day | ORAL | Status: DC
  Administered 2013-01-16 – 2013-01-18 (×4): 50 mg via ORAL
  Filled 2013-01-15 (×5): qty 1

## 2013-01-15 MED ORDER — ACETAMINOPHEN 650 MG RE SUPP: 650.0000 mg | Freq: Four times a day (QID) | RECTAL | Status: DC | PRN

## 2013-01-15 MED ORDER — FERROUS GLUCONATE 324 (38 FE) MG PO TABS
325.0000 mg | ORAL_TABLET | Freq: Every day | ORAL | Status: DC
  Filled 2013-01-15: qty 1

## 2013-01-15 MED ORDER — ASPIRIN EC 81 MG PO TBEC
81.0000 mg | DELAYED_RELEASE_TABLET | Freq: Every day | ORAL | Status: DC
  Administered 2013-01-16 – 2013-01-19 (×4): 81 mg via ORAL
  Filled 2013-01-15 (×5): qty 1

## 2013-01-15 MED ORDER — HYDROCODONE-ACETAMINOPHEN 5-325 MG PO TABS
1.0000 | ORAL_TABLET | Freq: Three times a day (TID) | ORAL | Status: DC | PRN
  Administered 2013-01-15 – 2013-01-17 (×4): 1 via ORAL
  Filled 2013-01-15 (×6): qty 1

## 2013-01-15 MED ORDER — VITAMIN B-6 100 MG PO TABS
100.0000 mg | ORAL_TABLET | Freq: Every day | ORAL | Status: DC
  Administered 2013-01-16 – 2013-01-19 (×4): 100 mg via ORAL
  Filled 2013-01-15 (×4): qty 1

## 2013-01-15 MED ORDER — ALPRAZOLAM 0.5 MG PO TABS
0.5000 mg | ORAL_TABLET | Freq: Three times a day (TID) | ORAL | Status: DC
  Administered 2013-01-16 (×2): 0.5 mg via ORAL
  Filled 2013-01-15 (×2): qty 1

## 2013-01-15 MED ORDER — ONDANSETRON HCL 4 MG PO TABS: 4.0000 mg | ORAL_TABLET | Freq: Four times a day (QID) | ORAL | Status: DC | PRN

## 2013-01-15 MED ORDER — VITAMIN C 500 MG PO TABS
500.0000 mg | ORAL_TABLET | Freq: Every day | ORAL | Status: DC
  Administered 2013-01-16: 500 mg via ORAL
  Filled 2013-01-15 (×2): qty 1

## 2013-01-15 MED ORDER — SODIUM CHLORIDE 0.9 % IJ SOLN
3.0000 mL | Freq: Two times a day (BID) | INTRAMUSCULAR | Status: DC
  Administered 2013-01-18: 3 mL via INTRAVENOUS

## 2013-01-15 MED ORDER — ONDANSETRON HCL 4 MG/2ML IJ SOLN: 4.0000 mg | Freq: Four times a day (QID) | INTRAMUSCULAR | Status: DC | PRN

## 2013-01-15 MED ORDER — SODIUM CHLORIDE 0.9 % IV SOLN
1000.0000 mL | Freq: Once | INTRAVENOUS | Status: AC
  Administered 2013-01-15: 1000 mL via INTRAVENOUS

## 2013-01-15 MED ORDER — SODIUM CHLORIDE 0.9 % IV SOLN: 1000.0000 mL | INTRAVENOUS | Status: DC

## 2013-01-15 MED ORDER — CALCIUM CITRATE-VITAMIN D 200-200 MG-UNIT PO TABS: 1.0000 | ORAL_TABLET | Freq: Every day | ORAL | Status: DC

## 2013-01-15 NOTE — H&P (Signed)
[facility] History and Physical  GENTRI GUARDADO ZOX:[id number] DOB: 09-10-1940 DOA: 01/15/2013  Referring physician: ER physician. PCP: Malissa Hippo, MD  Specialists: Dr. Caryn Section. Nephrologist.  Chief Complaint: Weakness and falls.  HPI: [name] is a 72 y.o. female with history of S/p nephrectomy, bladder removal with Right urostomy/ileal conduit, chronic kidney disease, CHF presented to the year because of weakness and falls. As per patient's family patient has been feeling weak with falls and also has poor appetite for last few days with nausea and vomiting. Denies any diarrhea or any abdominal pain. In the ER patient was found to be hypotensive and elevated creatinine for baseline. Patient has been admitted for further management. Patient otherwise denies any chest pain or shortness of breath. Patient is afebrile. UA shows features of UTI.    Review of Systems: As presented in the history of presenting illness, rest negative.  Past Medical History  Diagnosis Date  . Hypertension   . Chronic kidney disease   . CHF (congestive heart failure)   . Gastroesophageal reflux   . Chronic UTI   . Hydronephrosis     She has chronic mild left   . AV fistula     clotted in her left forearm  . Cellulitis   . Pneumothorax     secondary to central line placement  . Acute systolic heart failure     Again, EF 25-30%  . Single kidney   . Myocardial infarction 2011; 2012  . Numbness and tingling of both legs   . Anginal pain   . Heart murmur   . Dysrhythmia     "heart beats real fast"  . Chronic anemia   . Pneumonia     "one time"  . Chronic chest pain 01/14/12    1-2 X/day/H&P  . Arthritis     "at different places"  . Anxiety   . Depression   . Headache(784.0)     "alot; not daily"  . Frequent falls   . Dementia   . Collagen vascular disease   . Renal insufficiency   . Iron deficiency anemia 11/10/2012   Past Surgical History  Procedure Laterality Date  . Hip  arthroplasty      left; S/P fracture  . Rod left leg  2012    left hip "to almost my knee; for my hip"  . Creation / revision of ileostomy / jejunostomy  1973  . Nephrectomy  1973    right  . Fracture surgery    . Partial colectomy  1970  . Tonsillectomy and adenoidectomy      "when I was a kid"  . Cholecystectomy  2000  . Appendectomy  2000  . Abdominal hysterectomy    . Dilation and curettage of uterus    . Cesarean section  1969    only 4th of 4 children was c-section  . Av fistula placement  1970's    LFA  . Bladder removal      with ileal conduit and urostomy   Social History:  reports that she quit smoking about 8 years ago. Her smoking use included Cigarettes. She smoked 0.00 packs per day for 2 years. She has never used smokeless tobacco. She reports that she does not drink alcohol or use illicit drugs. Home. where does patient live-- Can do ADLs. Can patient participate in ADLs?  Allergies  Allergen Reactions  . Ketorolac Tromethamine Rash    Family History  Problem Relation Age of Onset  . Heart  attack Mother   . Healthy Daughter   . Healthy Daughter   . Healthy Son       Prior to Admission medications   Medication Sig Start Date End Date Taking? Authorizing Provider  alendronate (FOSAMAX) 70 MG tablet Take 70 mg by mouth every 7 (seven) days. Take with a full glass of water on an empty stomach. Takes on Sunday.   Yes Historical Provider, MD  ALPRAZolam Prudy Feeler) 0.5 MG tablet Take 1 tablet (0.5 mg total) by mouth 3 (three) times daily. 10/22/12  Yes Len Blalock, NP  amLODipine (NORVASC) 5 MG tablet Take 1 tablet (5 mg total) by mouth daily. 12/22/12  Yes Vesta Mixer, MD  aspirin 81 MG tablet Take 81 mg by mouth daily.     Yes Historical Provider, MD  calcium citrate-vitamin D 200-200 MG-UNIT TABS Take 1 tablet by mouth daily.     Yes Historical Provider, MD  carvedilol (COREG) 25 MG tablet Take 1 tablet (25 mg total) by mouth 2 (two) times daily with a  meal. 12/22/12  Yes Vesta Mixer, MD  doxepin (SINEQUAN) 50 MG capsule TAKE (1) CAPSULE DAILY AT BEDTIME. 01/12/13  Yes Malissa Hippo, MD  ferrous gluconate (FERGON) 325 MG tablet Take 325 mg by mouth daily with breakfast.     Yes Historical Provider, MD  fish oil-omega-3 fatty acids 1000 MG capsule Take 1 g by mouth 4 (four) times daily.    Yes Historical Provider, MD  hydrALAZINE (APRESOLINE) 10 MG tablet Take 1 tablet (10 mg total) by mouth 3 (three) times daily. 06/19/12  Yes Vesta Mixer, MD  HYDROcodone-acetaminophen (NORCO/VICODIN) 5-325 MG per tablet Take 1 tablet by mouth every 8 (eight) hours as needed for pain. 05/30/12  Yes Sorin Luanne Bras, MD  Multiple Vitamin (MULTIVITAMIN) tablet Take 1 tablet by mouth daily.     Yes Historical Provider, MD  nitroGLYCERIN (NITROSTAT) 0.4 MG SL tablet Place 1 tablet (0.4 mg total) under the tongue every 5 (five) minutes as needed. For chest pain 06/16/12  Yes Vesta Mixer, MD  omeprazole (PRILOSEC) 20 MG capsule Take 1 capsule (20 mg total) by mouth daily. 06/09/12  Yes Vesta Mixer, MD  pyridOXINE (VITAMIN B-6) 100 MG tablet Take 100 mg by mouth daily.   Yes Historical Provider, MD  sodium bicarbonate 650 MG tablet Take 1,300 mg by mouth 2 (two) times daily.    Yes Historical Provider, MD  vitamin C (ASCORBIC ACID) 500 MG tablet Take 500 mg by mouth daily.   Yes Historical Provider, MD   Physical Exam: Filed Vitals:   01/15/13 1851 01/15/13 1900 01/15/13 1930 01/15/13 2000  BP:  102/46 101/46 99/63  Pulse:  57 58 58  Temp: 96.1 F (35.6 C)     TempSrc: Rectal     Resp:  13 16 16   SpO2:  96% 97% 97%     General:  Well-developed well-nourished.  Eyes: Anicteric no pallor.  ENT: No discharge from ears eyes nose mouth.  Neck: No mass felt.  Cardiovascular: S1-S2 heard.  Respiratory: No rhonchi or crepitations.  Abdomen: Soft nontender bowel sounds present. Urostomy bag seen.  Skin: No rash. There are bruises on the left  elbow.  Musculoskeletal: No edema.  Psychiatric: Appears normal.  Neurologic: Alert and oriented to time place and person. Moves all extremities.  Labs on Admission:  Basic Metabolic Panel:  Recent Labs Lab 01/15/13 1746  NA 129*  K 3.1*  CL 100  CO2 13*  GLUCOSE 86  BUN 74*  CREATININE 2.83*  CALCIUM 9.3   Liver Function Tests:  Recent Labs Lab 01/15/13 1746  AST 12  ALT 14  ALKPHOS 55  BILITOT 0.2*  PROT 6.4  ALBUMIN 3.4*   No results found for this basename: LIPASE, AMYLASE,  in the last 168 hours  Recent Labs Lab 01/15/13 1746  AMMONIA 36   CBC:  Recent Labs Lab 01/15/13 1746  WBC 9.0  NEUTROABS 6.1  HGB 10.6*  HCT 29.7*  MCV 92.8  PLT 249   Cardiac Enzymes: No results found for this basename: CKTOTAL, CKMB, CKMBINDEX, TROPONINI,  in the last 168 hours  BNP (last 3 results) No results found for this basename: PROBNP,  in the last 8760 hours CBG: No results found for this basename: GLUCAP,  in the last 168 hours  Radiological Exams on Admission: Dg Thoracic Spine 2 View  01/15/2013   *RADIOLOGY REPORT*  Clinical Data: Larey Seat today, back pain  THORACIC SPINE - 2 VIEW  Comparison: 05/05/2012  Findings: 12 pairs of ribs. Superior endplate compression deformity of an upper thoracic vertebra is unchanged. Diffuse osseous demineralization. No acute fracture, subluxation or bone destruction. Visualized portions of the posterior ribs are unremarkable. Atherosclerotic calcification aortic arch.  IMPRESSION: Osseous demineralization. Chronic old compression deformity of an upper thoracic vertebra, stable. No acute abnormalities.   Original Report Authenticated By: Ulyses Southward, M.D.   Dg Elbow Complete Left  01/15/2013   *RADIOLOGY REPORT*  Clinical Data: History of injury from fall with pain.  Bruising.  LEFT ELBOW - COMPLETE 3+ VIEW  Comparison: None.  Findings: There is no positive fat pad sign seen to suggest joint effusion.  Surgical staples are seen in  the soft tissues of the anterior aspect of the elbow. Alignment is normal.  Joint spaces are preserved.  No fracture or dislocation is evident.  No soft tissue lesions are seen.  IMPRESSION: No evidence of fracture, dislocation, or joint effusion.   Original Report Authenticated By: Onalee Hua Call   Ct Head Wo Contrast  01/15/2013   *RADIOLOGY REPORT*  Clinical Data: Altered mental status  CT HEAD WITHOUT CONTRAST  Technique:  Contiguous axial images were obtained from the base of the skull through the vertex without contrast.  Comparison: CT scan of January 15, 2012.  Findings: Bony calvarium appears intact.  Mild diffuse cortical atrophy is noted.  Chronic ischemic white matter disease is noted. Cavum septum pellucidum is again noted. No mass effect or midline shift is noted.  Ventricular size is within normal limits.  There is no evidence of mass lesion, hemorrhage or acute infarction.  IMPRESSION: Mild diffuse cortical atrophy.  Chronic ischemic white matter disease.  No acute intracranial abnormality seen.   Original Report Authenticated By: Lupita Raider.,  M.D.   Dg Abd 2 Views  01/15/2013   *RADIOLOGY REPORT*  Clinical Data: Abdominal pain, fell today, multiple complaints  ABDOMEN - 2 VIEW  Comparison: 05/27/2012  Findings: Lung bases emphysematous but clear. Mildly prominent stool in colon. Gas present rectum. No evidence of bowel wall thickening, obstruction or free air. Bones diffusely demineralized with note of a left hip prosthesis. No definite urinary tract calcification. Injection granulomata project over the iliac wings bilaterally.  IMPRESSION: Mildly prominent stool in colon. Emphysematous changes. No acute abdominal findings.   Original Report Authenticated By: Ulyses Southward, M.D.    EKG: Independently reviewed. Sinus bradycardia.  Assessment/Plan Principal Problem:   Weakness Active Problems:  Nausea & vomiting   Renal failure (ARF), acute on chronic   UTI (lower urinary tract  infection)   1. Weakness with hypotension and acute renal failure probably secondary to dehydration - hold antihypertensives and continue to hydrate. Closely follow intake output and metabolic panel and respiratory status. Monitor in telemetry. 2. Nausea and vomiting - probably secondary to UTI. Check KUB for any obstruction or constipation. 3. UTI - continue antibiotics check urine culture. 4. Metabolic acidosis probably from chronic renal failure and dehydration - closely follow metabolic panel and intake output. Continue bicarbonate. 5.   Chronic kidney disease with history of S/p nephrectomy, bladder removal with Right urostomy/ileal conduit  - see #1 and 4. 6.   History of CHF and hypertension - presently hypotensive and is on IV fluids. Hold antihypertensives. 7.   Anemia - probably from chronic kidney disease. Follow CBC.    Code Status: Full code.  Family Communication: Patient's daughter at the bedside.  Disposition Plan: Admit to inpatient.    Chance Karam N. [facility] Pager [phone].  If 7PM-7AM, please contact night-coverage [email or web address] Password [facility] 01/15/2013, 8:24 PM

## 2013-01-15 NOTE — ED Notes (Signed)
Warm blankets placed on pt. 

## 2013-01-15 NOTE — ED Notes (Signed)
Per pt's husband - pt has gotten weaker, seems confused sometimes and has fallen at home some, sts this has been going on for a really long time but has gotten worse recently, can't say exactly when. Pt has bruise to left posterior arm, sts that is happened 2 days ago when she fell. Pt in nad, skin warm and dry, resp e/u.

## 2013-01-15 NOTE — ED Notes (Signed)
Pt presents with > 1 week h/o multiple falls.  Husband reports pt has been confused, "talking out of her head"; reports pt's BP has been low and heart rate in the 40's.

## 2013-01-15 NOTE — ED Provider Notes (Addendum)
CSN: [id number]     Arrival date & time 01/15/13  1458 History     First MD Initiated Contact with Patient 01/15/13 [phone]     Chief Complaint  Patient presents with  . Fall   Level V caveat: Altered mental status  The history is provided by the spouse.    Patient is brought to the emergency department by her husband for increasing falls over the past week with increasing generalized weakness over the past several weeks.  He reports that she's been confused and speaking abnormally.  She's had multiple falls.  No use of anticoagulants.  Does not drink alcohol.  No recent change in her medications per the husband.  Husband reports that she has not been eating or drinking much in the chest mainly just been lying around in the bed.  Past Medical History  Diagnosis Date  . Hypertension   . Chronic kidney disease   . CHF (congestive heart failure)   . Gastroesophageal reflux   . Chronic UTI   . Hydronephrosis     She has chronic mild left   . AV fistula     clotted in her left forearm  . Cellulitis   . Pneumothorax     secondary to central line placement  . Acute systolic heart failure     Again, EF 25-30%  . Single kidney   . Myocardial infarction 2011; 2012  . Numbness and tingling of both legs   . Anginal pain   . Heart murmur   . Dysrhythmia     "heart beats real fast"  . Chronic anemia   . Pneumonia     "one time"  . Chronic chest pain 01/14/12    1-2 X/day/H&P  . Arthritis     "at different places"  . Anxiety   . Depression   . Headache(784.0)     "alot; not daily"  . Frequent falls   . Dementia   . Collagen vascular disease   . Renal insufficiency   . Iron deficiency anemia 11/10/2012   Past Surgical History  Procedure Laterality Date  . Hip arthroplasty      left; S/P fracture  . Rod left leg  2012    left hip "to almost my knee; for my hip"  . Creation / revision of ileostomy / jejunostomy  1973  . Nephrectomy  1973    right  . Fracture surgery    .  Partial colectomy  1970  . Tonsillectomy and adenoidectomy      "when I was a kid"  . Cholecystectomy  2000  . Appendectomy  2000  . Abdominal hysterectomy    . Dilation and curettage of uterus    . Cesarean section  1969    only 4th of 4 children was c-section  . Av fistula placement  1970's    LFA  . Bladder removal      with ileal conduit and urostomy   Family History  Problem Relation Age of Onset  . Heart attack Mother   . Healthy Daughter   . Healthy Daughter   . Healthy Son    History  Substance Use Topics  . Smoking status: Former Smoker -- 2 years    Types: Cigarettes    Quit date: 06/25/2004  . Smokeless tobacco: Never Used     Comment: 01/14/12 "just smoked one cigarette, once in awhile for ~ 2 years"  . Alcohol Use: No   OB History   Grav Para Term  Preterm Abortions TAB SAB Ect Mult Living                 Review of Systems  Unable to perform ROS   Allergies  Ketorolac tromethamine  Home Medications   Current Outpatient Rx  Name  Route  Sig  Dispense  Refill  . alendronate (FOSAMAX) 70 MG tablet   Oral   Take 70 mg by mouth every 7 (seven) days. Take with a full glass of water on an empty stomach. Takes on Sunday.         . ALPRAZolam (XANAX) 0.5 MG tablet   Oral   Take 1 tablet (0.5 mg total) by mouth 3 (three) times daily.   90 tablet   0   . amLODipine (NORVASC) 5 MG tablet   Oral   Take 1 tablet (5 mg total) by mouth daily.   180 tablet   3   . aspirin 81 MG tablet   Oral   Take 81 mg by mouth daily.           . calcium citrate-vitamin D 200-200 MG-UNIT TABS   Oral   Take 1 tablet by mouth daily.           . carvedilol (COREG) 25 MG tablet   Oral   Take 1 tablet (25 mg total) by mouth 2 (two) times daily with a meal.   60 tablet   0   . doxepin (SINEQUAN) 50 MG capsule      TAKE (1) CAPSULE DAILY AT BEDTIME.   30 capsule   2   . ferrous gluconate (FERGON) 325 MG tablet   Oral   Take 325 mg by mouth daily with  breakfast.           . fish oil-omega-3 fatty acids 1000 MG capsule   Oral   Take 1 g by mouth 4 (four) times daily.          . hydrALAZINE (APRESOLINE) 10 MG tablet   Oral   Take 1 tablet (10 mg total) by mouth 3 (three) times daily.   90 tablet   5   . HYDROcodone-acetaminophen (NORCO/VICODIN) 5-325 MG per tablet   Oral   Take 1 tablet by mouth every 8 (eight) hours as needed for pain.   30 tablet   0   . Multiple Vitamin (MULTIVITAMIN) tablet   Oral   Take 1 tablet by mouth daily.           . nitroGLYCERIN (NITROSTAT) 0.4 MG SL tablet   Sublingual   Place 1 tablet (0.4 mg total) under the tongue every 5 (five) minutes as needed. For chest pain   25 tablet   5   . omeprazole (PRILOSEC) 20 MG capsule   Oral   Take 1 capsule (20 mg total) by mouth daily.   30 capsule   5     Take 30 minutes before breakfast daily   . pyridOXINE (VITAMIN B-6) 100 MG tablet   Oral   Take 100 mg by mouth daily.         . sodium bicarbonate 650 MG tablet   Oral   Take 1,300 mg by mouth 2 (two) times daily.          . vitamin C (ASCORBIC ACID) 500 MG tablet   Oral   Take 500 mg by mouth daily.          BP 101/78  Pulse 57  Temp(Src) 96.1 F (35.6 C) (  Rectal)  Resp 18  SpO2 99% Physical Exam  Nursing note and vitals reviewed. Constitutional: She appears well-developed and well-nourished. No distress.  HENT:  Head: Normocephalic and atraumatic.  Eyes: EOM are normal.  Neck: Normal range of motion. Neck supple.  No cervical tenderness  Cardiovascular: Normal rate, regular rhythm and normal heart sounds.   Pulmonary/Chest: Effort normal and breath sounds normal.  Abdominal: Soft. She exhibits no distension. There is no tenderness.  Musculoskeletal: Normal range of motion.  Mild thoracic vertebral tenderness without step-off  Neurological:  Opens her eyes to voice and follow simple commands.  5 out of 5 strength in bilateral upper lower extremity major muscle  groups  Skin: Skin is warm and dry.  Psychiatric: She has a normal mood and affect. Judgment normal.    ED Course   Procedures (including critical care time)  Labs Reviewed  CBC WITH DIFFERENTIAL - Abnormal; Notable for the following:    RBC 3.20 (*)    Hemoglobin 10.6 (*)    HCT 29.7 (*)    RDW 16.2 (*)    All other components within normal limits  COMPREHENSIVE METABOLIC PANEL - Abnormal; Notable for the following:    Sodium 129 (*)    Potassium 3.1 (*)    CO2 13 (*)    BUN 74 (*)    Creatinine, Ser 2.83 (*)    Albumin 3.4 (*)    Total Bilirubin 0.2 (*)    GFR calc non Af Amer 16 (*)    GFR calc Af Amer 18 (*)    All other components within normal limits  URINE RAPID DRUG SCREEN (HOSP PERFORMED) - Abnormal; Notable for the following:    Opiates POSITIVE (*)    Benzodiazepines POSITIVE (*)    All other components within normal limits  AMMONIA  ETHANOL  LACTIC ACID, PLASMA  URINALYSIS, ROUTINE W REFLEX MICROSCOPIC   Dg Thoracic Spine 2 View  01/15/2013   *RADIOLOGY REPORT*  Clinical Data: Larey Seat today, back pain  THORACIC SPINE - 2 VIEW  Comparison: 05/05/2012  Findings: 12 pairs of ribs. Superior endplate compression deformity of an upper thoracic vertebra is unchanged. Diffuse osseous demineralization. No acute fracture, subluxation or bone destruction. Visualized portions of the posterior ribs are unremarkable. Atherosclerotic calcification aortic arch.  IMPRESSION: Osseous demineralization. Chronic old compression deformity of an upper thoracic vertebra, stable. No acute abnormalities.   Original Report Authenticated By: Ulyses Southward, M.D.   Dg Elbow Complete Left  01/15/2013   *RADIOLOGY REPORT*  Clinical Data: History of injury from fall with pain.  Bruising.  LEFT ELBOW - COMPLETE 3+ VIEW  Comparison: None.  Findings: There is no positive fat pad sign seen to suggest joint effusion.  Surgical staples are seen in the soft tissues of the anterior aspect of the elbow.  Alignment is normal.  Joint spaces are preserved.  No fracture or dislocation is evident.  No soft tissue lesions are seen.  IMPRESSION: No evidence of fracture, dislocation, or joint effusion.   Original Report Authenticated By: Onalee Hua Call   Ct Head Wo Contrast  01/15/2013   *RADIOLOGY REPORT*  Clinical Data: Altered mental status  CT HEAD WITHOUT CONTRAST  Technique:  Contiguous axial images were obtained from the base of the skull through the vertex without contrast.  Comparison: CT scan of January 15, 2012.  Findings: Bony calvarium appears intact.  Mild diffuse cortical atrophy is noted.  Chronic ischemic white matter disease is noted. Cavum septum pellucidum is again noted. No  mass effect or midline shift is noted.  Ventricular size is within normal limits.  There is no evidence of mass lesion, hemorrhage or acute infarction.  IMPRESSION: Mild diffuse cortical atrophy.  Chronic ischemic white matter disease.  No acute intracranial abnormality seen.   Original Report Authenticated By: Lupita Raider.,  M.D.   Dg Abd 2 Views  01/15/2013   *RADIOLOGY REPORT*  Clinical Data: Abdominal pain, fell today, multiple complaints  ABDOMEN - 2 VIEW  Comparison: 05/27/2012  Findings: Lung bases emphysematous but clear. Mildly prominent stool in colon. Gas present rectum. No evidence of bowel wall thickening, obstruction or free air. Bones diffusely demineralized with note of a left hip prosthesis. No definite urinary tract calcification. Injection granulomata project over the iliac wings bilaterally.  IMPRESSION: Mildly prominent stool in colon. Emphysematous changes. No acute abdominal findings.   Original Report Authenticated By: Ulyses Southward, M.D.   I personally reviewed the imaging tests through PACS system I reviewed available ER/hospitalization records through the EMR   1. Renal failure   2. Dehydration   3. Urinary tract infection     MDM  Patient with new-onset renal failure.  She will be admitted for  ongoing treatment.  Likely much of this is volume depletion.  2 L of fluid given in the emergency department.  She is hypothermic here with a rectal temp of 96.1 but her lactic acid is normal.  Her blood pressure responded quickly to IV fluids.  Rocephin is being given for her urinary tract infection.  Urine culture sent.  Patient will be managed on the hospitalist service  Lyanne Co, MD 01/15/13 1959   Date: 01/15/2013  Rate: 43  Rhythm: Sinus bradycardia  QRS Axis: normal  Intervals: normal  ST/T Wave abnormalities: normal  Conduction Disutrbances: none  Narrative Interpretation:   Old EKG Reviewed: changed     Lyanne Co, MD 01/15/13 2002

## 2013-01-16 ENCOUNTER — Encounter (HOSPITAL_COMMUNITY): Payer: Self-pay | Admitting: General Practice

## 2013-01-16 DIAGNOSIS — E876 Hypokalemia: Secondary | ICD-10-CM | POA: Diagnosis present

## 2013-01-16 DIAGNOSIS — D649 Anemia, unspecified: Secondary | ICD-10-CM | POA: Diagnosis present

## 2013-01-16 DIAGNOSIS — G92 Toxic encephalopathy: Secondary | ICD-10-CM | POA: Diagnosis present

## 2013-01-16 DIAGNOSIS — R5381 Other malaise: Secondary | ICD-10-CM

## 2013-01-16 DIAGNOSIS — E872 Acidosis: Secondary | ICD-10-CM | POA: Diagnosis present

## 2013-01-16 DIAGNOSIS — I10 Essential (primary) hypertension: Secondary | ICD-10-CM

## 2013-01-16 DIAGNOSIS — I5022 Chronic systolic (congestive) heart failure: Secondary | ICD-10-CM | POA: Diagnosis present

## 2013-01-16 DIAGNOSIS — N19 Unspecified kidney failure: Secondary | ICD-10-CM

## 2013-01-16 LAB — CBC WITH DIFFERENTIAL/PLATELET
Basophils Absolute: 0 10*3/uL (ref 0.0–0.1)
Eosinophils Absolute: 0.1 10*3/uL (ref 0.0–0.7)
Eosinophils Relative: 1 % (ref 0–5)
MCH: 34.2 pg — ABNORMAL HIGH (ref 26.0–34.0)
MCV: 92.5 fL (ref 78.0–100.0)
Platelets: 239 10*3/uL (ref 150–400)
RDW: 16.3 % — ABNORMAL HIGH (ref 11.5–15.5)

## 2013-01-16 LAB — COMPREHENSIVE METABOLIC PANEL
ALT: 12 U/L (ref 0–35)
AST: 13 U/L (ref 0–37)
Alkaline Phosphatase: 45 U/L (ref 39–117)
CO2: 12 mEq/L — ABNORMAL LOW (ref 19–32)
Chloride: 113 mEq/L — ABNORMAL HIGH (ref 96–112)
GFR calc non Af Amer: 23 mL/min — ABNORMAL LOW (ref >90)
Sodium: 140 mEq/L (ref 135–145)
Total Bilirubin: 0.2 mg/dL — ABNORMAL LOW (ref 0.3–1.2)

## 2013-01-16 LAB — TSH: TSH: 0.795 u[IU]/mL (ref 0.350–4.500)

## 2013-01-16 MED ORDER — CYCLOBENZAPRINE HCL 5 MG PO TABS
5.0000 mg | ORAL_TABLET | Freq: Three times a day (TID) | ORAL | Status: DC | PRN
  Administered 2013-01-17: 5 mg via ORAL
  Filled 2013-01-16: qty 1

## 2013-01-16 MED ORDER — POTASSIUM CHLORIDE 10 MEQ/100ML IV SOLN
10.0000 meq | INTRAVENOUS | Status: AC
  Administered 2013-01-16 (×4): 10 meq via INTRAVENOUS
  Filled 2013-01-16 (×4): qty 100

## 2013-01-16 MED ORDER — BOOST / RESOURCE BREEZE PO LIQD
1.0000 | Freq: Every day | ORAL | Status: DC
  Administered 2013-01-16 – 2013-01-18 (×3): 1 via ORAL

## 2013-01-16 MED ORDER — ALPRAZOLAM 0.5 MG PO TABS
0.5000 mg | ORAL_TABLET | Freq: Every day | ORAL | Status: DC
  Administered 2013-01-16 – 2013-01-18 (×3): 0.5 mg via ORAL
  Filled 2013-01-16 (×3): qty 1

## 2013-01-16 MED ORDER — FERROUS GLUCONATE 324 (38 FE) MG PO TABS
324.0000 mg | ORAL_TABLET | Freq: Every day | ORAL | Status: DC
  Administered 2013-01-16: 324 mg via ORAL
  Filled 2013-01-16: qty 1

## 2013-01-16 MED ORDER — POTASSIUM CHLORIDE IN NACL 40-0.9 MEQ/L-% IV SOLN
INTRAVENOUS | Status: DC
  Administered 2013-01-16: 18:00:00 via INTRAVENOUS
  Administered 2013-01-17: 20 mL/h via INTRAVENOUS
  Administered 2013-01-17: 07:00:00 via INTRAVENOUS
  Filled 2013-01-16 (×5): qty 1000

## 2013-01-16 MED ORDER — ALPRAZOLAM 0.5 MG PO TABS: 0.5000 mg | ORAL_TABLET | Freq: Every day | ORAL | Status: DC

## 2013-01-16 NOTE — Progress Notes (Addendum)
Chart reviewed.  [facility] PROGRESS NOTE  [name] WGN:[id number] DOB: 06-04-1941 DOA: 01/15/2013 PCP: Malissa Hippo, MD  Assessment/Plan:  Principal Problem:   UTI (lower urinary tract infection): Continue Rocephin Active Problems:   Hypokalemia: Replete IV.   Weakness: Physical therapy evaluation   Nausea & vomiting secondary to above. None currently.   Renal failure (ARF), acute on chronic: Improved today.   Chronic systolic heart failure: No evidence of CHF currently   Metabolic acidosis: Lactate normal.   Encephalopathy, toxic:  secondary to above   Anemia: Monitor   Anxiety: Change Xanax to nightly.   Fall at home   Hypertension: Medications held.   Hyponatremia: Resolved  Code Status: Full Family Communication: Daughter at bedside Disposition Plan: Likely home.  Consultants:    Procedures:    Antibiotics:  Rocephin  HPI/Subjective: Complains of left arm and chest wall pain due to fall. No dyspnea. Per patient's daughter, still slightly confused. Had muscle spasms earlier today  Objective: Filed Vitals:   01/15/13 2215 01/15/13 2306 01/16/13 0615 01/16/13 1410  BP:  107/45 112/38 83/41  Pulse: 58 64 56 68  Temp:  97.7 F (36.5 C) 98.2 F (36.8 C) 97.9 F (36.6 C)  TempSrc:  Oral Other (Comment) Oral  Resp: 18 17 18 15   Height:  5\' 5"  (1.651 m)    Weight:  57.561 kg (126 lb 14.4 oz)    SpO2: 96% 99% 96% 99%    Intake/Output Summary (Last 24 hours) at 01/16/13 1603 Last data filed at 01/16/13 0900  Gross per 24 hour  Intake    360 ml  Output   1900 ml  Net  -1540 ml   Filed Weights   01/15/13 2306  Weight: 57.561 kg (126 lb 14.4 oz)    Exam:   General:  Awake. Comfortable. Oriented to place and person. Cooperative.  Cardiovascular: Regular rate rhythm without murmurs gallops rubs  Respiratory: Clear to auscultation bilaterally without wheeze rhonchi or rales  Abdomen: Soft nontender. Bowel sounds  present  Extremities: No clubbing cyanosis or edema  Data Reviewed: Basic Metabolic Panel:  Recent Labs Lab 01/15/13 1746 01/16/13 0520  NA 129* 140  K 3.1* 2.7*  CL 100 113*  CO2 13* 12*  GLUCOSE 86 80  BUN 74* 58*  CREATININE 2.83* 2.04*  CALCIUM 9.3 8.6  MG  --  2.1   Liver Function Tests:  Recent Labs Lab 01/15/13 1746 01/16/13 0520  AST 12 13  ALT 14 12  ALKPHOS 55 45  BILITOT 0.2* 0.2*  PROT 6.4 5.6*  ALBUMIN 3.4* 2.9*   No results found for this basename: LIPASE, AMYLASE,  in the last 168 hours  Recent Labs Lab 01/15/13 1746  AMMONIA 36   CBC:  Recent Labs Lab 01/15/13 1746 01/16/13 0520  WBC 9.0 8.9  NEUTROABS 6.1 6.3  HGB 10.6* 10.5*  HCT 29.7* 28.4*  MCV 92.8 92.5  PLT 249 239   Cardiac Enzymes:  Recent Labs Lab 01/16/13 0034  TROPONINI <0.30   BNP (last 3 results) No results found for this basename: PROBNP,  in the last 8760 hours CBG: No results found for this basename: GLUCAP,  in the last 168 hours  No results found for this or any previous visit (from the past 240 hour(s)).   Studies: Dg Thoracic Spine 2 View  01/15/2013   *RADIOLOGY REPORT*  Clinical Data: Larey Seat today, back pain  THORACIC SPINE - 2 VIEW  Comparison: 05/05/2012  Findings: 12 pairs of  ribs. Superior endplate compression deformity of an upper thoracic vertebra is unchanged. Diffuse osseous demineralization. No acute fracture, subluxation or bone destruction. Visualized portions of the posterior ribs are unremarkable. Atherosclerotic calcification aortic arch.  IMPRESSION: Osseous demineralization. Chronic old compression deformity of an upper thoracic vertebra, stable. No acute abnormalities.   Original Report Authenticated By: Ulyses Southward, M.D.   Dg Elbow Complete Left  01/15/2013   *RADIOLOGY REPORT*  Clinical Data: History of injury from fall with pain.  Bruising.  LEFT ELBOW - COMPLETE 3+ VIEW  Comparison: None.  Findings: There is no positive fat pad sign seen  to suggest joint effusion.  Surgical staples are seen in the soft tissues of the anterior aspect of the elbow. Alignment is normal.  Joint spaces are preserved.  No fracture or dislocation is evident.  No soft tissue lesions are seen.  IMPRESSION: No evidence of fracture, dislocation, or joint effusion.   Original Report Authenticated By: Onalee Hua Call   Ct Head Wo Contrast  01/15/2013   *RADIOLOGY REPORT*  Clinical Data: Altered mental status  CT HEAD WITHOUT CONTRAST  Technique:  Contiguous axial images were obtained from the base of the skull through the vertex without contrast.  Comparison: CT scan of January 15, 2012.  Findings: Bony calvarium appears intact.  Mild diffuse cortical atrophy is noted.  Chronic ischemic white matter disease is noted. Cavum septum pellucidum is again noted. No mass effect or midline shift is noted.  Ventricular size is within normal limits.  There is no evidence of mass lesion, hemorrhage or acute infarction.  IMPRESSION: Mild diffuse cortical atrophy.  Chronic ischemic white matter disease.  No acute intracranial abnormality seen.   Original Report Authenticated By: Lupita Raider.,  M.D.   Dg Abd 2 Views  01/15/2013   *RADIOLOGY REPORT*  Clinical Data: Abdominal pain, fell today, multiple complaints  ABDOMEN - 2 VIEW  Comparison: 05/27/2012  Findings: Lung bases emphysematous but clear. Mildly prominent stool in colon. Gas present rectum. No evidence of bowel wall thickening, obstruction or free air. Bones diffusely demineralized with note of a left hip prosthesis. No definite urinary tract calcification. Injection granulomata project over the iliac wings bilaterally.  IMPRESSION: Mildly prominent stool in colon. Emphysematous changes. No acute abdominal findings.   Original Report Authenticated By: Ulyses Southward, M.D.   Scheduled Meds: . ALPRAZolam  0.5 mg Oral TID  . aspirin EC  81 mg Oral Daily  . calcium-vitamin D  1 tablet Oral Q breakfast  . cefTRIAXone (ROCEPHIN)  IV  1  g Intravenous Q24H  . doxepin  50 mg Oral QHS  . feeding supplement  1 Container Oral QPC supper  . ferrous gluconate  324 mg Oral Q breakfast  . heparin  5,000 Units Subcutaneous Q8H  . multivitamin with minerals  1 tablet Oral Daily  . omega-3 acid ethyl esters  1 g Oral TID AC & HS  . pantoprazole  40 mg Oral Daily  . pyridOXINE  100 mg Oral Daily  . sodium bicarbonate  1,300 mg Oral BID  . sodium chloride  3 mL Intravenous Q12H  . vitamin C  500 mg Oral Daily   Continuous Infusions:   Time spent: 35 minutes  Alphus Zeck L  [facility] Pager [phone]. If 7PM-7AM, please contact night-coverage at [email or web address], password [facility] 01/16/2013, 4:03 PM  LOS: 1 day

## 2013-01-16 NOTE — Progress Notes (Signed)
INITIAL NUTRITION ASSESSMENT  DOCUMENTATION CODES Per approved criteria  -Not Applicable   INTERVENTION:  Resource Breeze daily (250 kcals, 9 gm protein per 8 fl oz carton) RD to follow for nutrition care plan  NUTRITION DIAGNOSIS: Inadequate oral intake related to poor appetite as evidenced by patient report  Goal: Oral intake with meals & supplements to meet >/= 90% of estimated nutrition needs  Monitor:  PO & supplemental intake, weight, labs, I/O's  Reason for Assessment: Malnutrition Screening Tool Report  72 y.o. female  Admitting Dx: Weakness  ASSESSMENT: Patient with history of nephrectomy, bladder removal with right urostomy/ileal conduit, CKD and CHF presented to [facility] ED because of weakness and falls; in ER patient found to be hypotensive and with elevated creatinine levels for baseline ---> urine analysis showed features of UTI.   Patient reports she eats "pretty good" at home; she does eat 3 meals per day; she states recently her appetite has been decreased; weight has been stable; she doesn't like Ensure supplements, however, enjoys juice; RD offered Resource Breeze supplement ---> patient agreeable to trying.  Height: Ht Readings from Last 1 Encounters:  01/15/13 5\' 5"  (1.651 m)    Weight: Wt Readings from Last 1 Encounters:  01/15/13 126 lb 14.4 oz (57.561 kg)    Ideal Body Weight: 125 lb  % Ideal Body Weight: 101%  Wt Readings from Last 10 Encounters:  01/15/13 126 lb 14.4 oz (57.561 kg)  12/22/12 127 lb 12.8 oz (57.97 kg)  05/29/12 126 lb 5.2 oz (57.3 kg)  05/05/12 121 lb 6.4 oz (55.067 kg)  01/16/12 109 lb 5.6 oz (49.6 kg)  01/07/12 132 lb (59.875 kg)  10/22/11 130 lb 1.9 oz (59.022 kg)  10/08/11 121 lb 14.4 oz (55.293 kg)  07/09/11 130 lb (58.968 kg)  04/20/11 125 lb (56.7 kg)    Usual Body Weight: 127 lb  % Usual Body Weight: 99%  BMI:  Body mass index is 21.12 kg/(m^2).  Estimated Nutritional Needs: Kcal: 1500-1700 Protein: 70-80  gm Fluid: 1.5-1.7 L  Skin: Intact  Diet Order: Cardiac  EDUCATION NEEDS: -No education needs identified at this time   Intake/Output Summary (Last 24 hours) at 01/16/13 1456 Last data filed at 01/16/13 0900  Gross per 24 hour  Intake    360 ml  Output   1900 ml  Net  -1540 ml    Labs:   Recent Labs Lab 01/15/13 1746 01/16/13 0520  NA 129* 140  K 3.1* 2.7*  CL 100 113*  CO2 13* 12*  BUN 74* 58*  CREATININE 2.83* 2.04*  CALCIUM 9.3 8.6  MG  --  2.1  GLUCOSE 86 80    Scheduled Meds: . ALPRAZolam  0.5 mg Oral TID  . aspirin EC  81 mg Oral Daily  . calcium-vitamin D  1 tablet Oral Q breakfast  . cefTRIAXone (ROCEPHIN)  IV  1 g Intravenous Q24H  . doxepin  50 mg Oral QHS  . ferrous gluconate  324 mg Oral Q breakfast  . heparin  5,000 Units Subcutaneous Q8H  . multivitamin with minerals  1 tablet Oral Daily  . omega-3 acid ethyl esters  1 g Oral TID AC & HS  . pantoprazole  40 mg Oral Daily  . pyridOXINE  100 mg Oral Daily  . sodium bicarbonate  1,300 mg Oral BID  . sodium chloride  3 mL Intravenous Q12H  . vitamin C  500 mg Oral Daily    Continuous Infusions:   Past Medical  History  Diagnosis Date  . Hypertension   . Chronic kidney disease   . CHF (congestive heart failure)   . Gastroesophageal reflux   . Chronic UTI   . Hydronephrosis     She has chronic mild left   . AV fistula     clotted in her left forearm  . Cellulitis   . Pneumothorax     secondary to central line placement  . Acute systolic heart failure     Again, EF 25-30%  . Single kidney   . Myocardial infarction 2011; 2012  . Numbness and tingling of both legs   . Anginal pain   . Heart murmur   . Dysrhythmia     "heart beats real fast"  . Chronic anemia   . Pneumonia     "one time"  . Chronic chest pain 01/14/12    1-2 X/day/H&P  . Arthritis     "at different places"  . Anxiety   . Depression   . Headache(784.0)     "alot; not daily"  . Frequent falls   . Dementia   .  Collagen vascular disease   . Renal insufficiency   . Iron deficiency anemia 11/10/2012    Past Surgical History  Procedure Laterality Date  . Hip arthroplasty      left; S/P fracture  . Rod left leg  2012    left hip "to almost my knee; for my hip"  . Creation / revision of ileostomy / jejunostomy  1973  . Nephrectomy  1973    right  . Fracture surgery    . Partial colectomy  1970  . Tonsillectomy and adenoidectomy      "when I was a kid"  . Cholecystectomy  2000  . Appendectomy  2000  . Abdominal hysterectomy    . Dilation and curettage of uterus    . Cesarean section  1969    only 4th of 4 children was c-section  . Av fistula placement  1970's    LFA  . Bladder removal      with ileal conduit and urostomy    [name], RD, LDN Pager #: [phone] After-Hours Pager #: [phone]

## 2013-01-16 NOTE — Progress Notes (Signed)
CRITICAL VALUE ALERT  Critical value received:  K+ - 2.7  Date of notification:  01/16/2013  Time of notification:  0718  Critical value read back:yes  Nurse who received alert:  Chuck Hint, RN  MD notified (1st page):  Lendell Caprice     Time of first page:  0722  MD notified (2nd page):  Time of second page:  Responding MD:  Lendell Caprice  Time MD responded:  New order entered @ [address], Sharyon Medicus

## 2013-01-17 DIAGNOSIS — E876 Hypokalemia: Secondary | ICD-10-CM

## 2013-01-17 LAB — BASIC METABOLIC PANEL
Chloride: 112 mEq/L (ref 96–112)
GFR calc Af Amer: 37 mL/min — ABNORMAL LOW (ref >90)
GFR calc non Af Amer: 32 mL/min — ABNORMAL LOW (ref >90)
Potassium: 3.4 mEq/L — ABNORMAL LOW (ref 3.5–5.1)
Sodium: 139 mEq/L (ref 135–145)

## 2013-01-17 MED ORDER — POTASSIUM CHLORIDE CRYS ER 20 MEQ PO TBCR
20.0000 meq | EXTENDED_RELEASE_TABLET | Freq: Two times a day (BID) | ORAL | Status: DC
  Administered 2013-01-17 – 2013-01-19 (×5): 20 meq via ORAL
  Filled 2013-01-17 (×6): qty 1

## 2013-01-17 MED ORDER — CARVEDILOL 6.25 MG PO TABS
6.2500 mg | ORAL_TABLET | Freq: Two times a day (BID) | ORAL | Status: DC
  Administered 2013-01-17 – 2013-01-18 (×2): 6.25 mg via ORAL
  Filled 2013-01-17 (×4): qty 1

## 2013-01-17 NOTE — Evaluation (Signed)
Physical Therapy Evaluation Patient Details Name: [name] MRN: [id number] DOB: 08-22-1940 Today's Date: 01/17/2013 Time: 1120-1150 PT Time Calculation (min): 30 min  PT Assessment / Plan / Recommendation History of Present Illness   [name] is a 72 y.o. female with history of S/p nephrectomy, bladder removal with Right urostomy/ileal conduit, chronic kidney disease, CHF presented to the hospital because of weakness and falls. As per patient's family patient has been feeling weak with falls and also has poor appetite for last few days with nausea and vomiting. Denies any diarrhea or any abdominal pain. In the ER patient was found to be hypotensive and elevated creatinine for baseline. Patient has been admitted for further management. Patient otherwise denies any chest pain or shortness of breath. Patient is afebrile. UA shows features of UTI.    Clinical Impression  Pt admitted with with weakness, confusion due to UTI.  On eval, she is distractable and doesn't know her limitations and presents with weakness and gait instability.  Pt currently with functional limitations due to the deficits listed below (see PT Problem List).  Husband would like pt to go to the [facility] for therapy to build strength and function before D/C home.   Pt will benefit from skilled PT to increase their independence and safety with mobility to allow discharge to the venue listed below.     PT Assessment  Patient needs continued PT services    Follow Up Recommendations  SNF;Other (comment) (per husband's wish to stregthen her up to make her more safe)    Does the patient have the potential to tolerate intense rehabilitation      Barriers to Discharge        Equipment Recommendations  None recommended by PT    Recommendations for Other Services     Frequency Min 3X/week    Precautions / Restrictions Precautions Precautions: Fall Restrictions Weight Bearing Restrictions: No   Pertinent  Vitals/Pain       Mobility  Bed Mobility Bed Mobility: Not assessed Transfers Transfers: Sit to Stand;Stand to Sit Sit to Stand: 4: Min assist;With upper extremity assist;From chair/3-in-1 Stand to Sit: 4: Min assist;With upper extremity assist;To chair/3-in-1 Details for Transfer Assistance: vc's for hand placement and general safety Ambulation/Gait Ambulation/Gait Assistance: 4: Min assist;4: Min guard Ambulation Distance (Feet): 150 Feet Assistive device: 1 person hand held assist Ambulation/Gait Assistance Details: unsteady throughout, with period ot time in straight line walking that pt is min guard.  But any challenge to gait incl directional changes, scanning produces more instability and need for assist Gait Pattern: Step-through pattern Stairs: No    Exercises     PT Diagnosis: Difficulty walking;Generalized weakness  PT Problem List: Decreased strength;Decreased activity tolerance;Decreased mobility;Decreased balance;Decreased cognition PT Treatment Interventions: DME instruction;Gait training;Stair training;Functional mobility training;Balance training;Therapeutic activities;Patient/family education     PT Goals(Current goals can be found in the care plan section) Acute Rehab PT Goals Patient Stated Goal: Eventually get back home PT Goal Formulation: With patient/family Time For Goal Achievement: 01/31/13 Potential to Achieve Goals: Good  Visit Information  Last PT Received On: 01/17/13 Assistance Needed: +1 History of Present Illness:  [name] is a 72 y.o. female with history of S/p nephrectomy, bladder removal with Right urostomy/ileal conduit, chronic kidney disease, CHF presented to the hospital because of weakness and falls. As per patient's family patient has been feeling weak with falls and also has poor appetite for last few days with nausea and vomiting. Denies any diarrhea  or any abdominal pain. In the ER patient was found to be hypotensive and  elevated creatinine for baseline. Patient has been admitted for further management. Patient otherwise denies any chest pain or shortness of breath. Patient is afebrile. UA shows features of UTI.         Prior Functioning  Home Living Family/patient expects to be discharged to:: Private residence Living Arrangements: Spouse/significant other Available Help at Discharge: Family;Available 24 hours/day Type of Home: House Home Access: Stairs to enter [employer] of Steps: 1 Home Layout: One level Home Equipment: Cane - single point;Walker - 2 wheels Additional Comments: pt uses Cane mostly.   Prior Function Level of Independence: Needs assistance Gait / Transfers Assistance Needed: , but needs alot of assist otherwise.  Husband is stressed out over the level of vigilence he has to maintain. Communication Communication: No difficulties    Cognition  Cognition Arousal/Alertness: Awake/alert Behavior During Therapy: WFL for tasks assessed/performed Overall Cognitive Status: Within Functional Limits for tasks assessed    Extremity/Trunk Assessment Upper Extremity Assessment Upper Extremity Assessment: Generalized weakness Lower Extremity Assessment Lower Extremity Assessment: Generalized weakness Cervical / Trunk Assessment Cervical / Trunk Assessment: Normal   Balance Balance Balance Assessed: Yes Static Standing Balance Static Standing - Balance Support: Left upper extremity supported;Right upper extremity supported;No upper extremity supported Static Standing - Level of Assistance: 4: Min assist;5: Stand by assistance Static Standing - Comment/# of Minutes: standing looking at the fountains through the window pt steady until looks around.  End of Session PT - End of Session Activity Tolerance: Patient tolerated treatment well Patient left: in chair;with call bell/phone within reach;with family/visitor present Nurse Communication: Mobility status  GP     Ranita Stjulien,  Eliseo Gum 01/17/2013, 1:15 PM 01/17/2013  [place], PT [phone]  (pager)

## 2013-01-17 NOTE — Progress Notes (Signed)
Chart reviewed.  [facility] PROGRESS NOTE  [name] ZOX:[id number] DOB: [birth year] DOA: 01/15/2013 PCP: Malissa Hippo, MD  Assessment/Plan:  Principal Problem:   UTI (lower urinary tract infection): Continue Rocephin Active Problems:   Hypokalemia: improving   Weakness: ST SNF   Nausea & vomiting secondary to above. None currently.   Renal failure (ARF), acute on chronic: resolved. Slow down IVF   Chronic systolic heart failure: No evidence of CHF currently   Metabolic acidosis: Lactate normal.   Encephalopathy, toxic:  secondary to above   Anemia: Monitor   Anxiety:    Fall at home   Hypertension: resume coreg at lower dose   Hyponatremia: Resolved  Code Status: Full Family Communication: Grandson Disposition Plan: SNF  Consultants:    Procedures:    Antibiotics:  Rocephin  HPI/Subjective: Stronger. Ate a bit. No dyspnea  Objective: Filed Vitals:   01/16/13 1410 01/16/13 1705 01/16/13 2136 01/17/13 0502  BP: 83/41 116/45 121/50 123/56  Pulse: 68 65 78 66  Temp: 97.9 F (36.6 C) 98.1 F (36.7 C) 98.9 F (37.2 C) 98.1 F (36.7 C)  TempSrc: Oral  Oral Oral  Resp: 15 18 18 16   Height:      Weight:    57.607 kg (127 lb)  SpO2: 99% 99% 97% 98%    Intake/Output Summary (Last 24 hours) at 01/17/13 1413 Last data filed at 01/17/13 0911  Gross per 24 hour  Intake    540 ml  Output   2500 ml  Net  -1960 ml   Filed Weights   01/15/13 2306 01/17/13 0502  Weight: 57.561 kg (126 lb 14.4 oz) 57.607 kg (127 lb)    Exam:   General:  Awake. Comfortable. Oriented to place and person. Cooperative.  Cardiovascular: Regular rate rhythm without murmurs gallops rubs  Respiratory: Clear to auscultation bilaterally without wheeze rhonchi or rales  Abdomen: Soft nontender. Bowel sounds present  Extremities: No clubbing cyanosis or edema  Data Reviewed: Basic Metabolic Panel:  Recent Labs Lab 01/15/13 1746 01/16/13 0520 01/17/13 0450   NA 129* 140 139  K 3.1* 2.7* 3.4*  CL 100 113* 112  CO2 13* 12* 15*  GLUCOSE 86 80 85  BUN 74* 58* 31*  CREATININE 2.83* 2.04* 1.55*  CALCIUM 9.3 8.6 8.4  MG  --  2.1  --    Liver Function Tests:  Recent Labs Lab 01/15/13 1746 01/16/13 0520  AST 12 13  ALT 14 12  ALKPHOS 55 45  BILITOT 0.2* 0.2*  PROT 6.4 5.6*  ALBUMIN 3.4* 2.9*   No results found for this basename: LIPASE, AMYLASE,  in the last 168 hours  Recent Labs Lab 01/15/13 1746  AMMONIA 36   CBC:  Recent Labs Lab 01/15/13 1746 01/16/13 0520  WBC 9.0 8.9  NEUTROABS 6.1 6.3  HGB 10.6* 10.5*  HCT 29.7* 28.4*  MCV 92.8 92.5  PLT 249 239   Cardiac Enzymes:  Recent Labs Lab 01/16/13 0034  TROPONINI <0.30   BNP (last 3 results) No results found for this basename: PROBNP,  in the last 8760 hours CBG: No results found for this basename: GLUCAP,  in the last 168 hours  Recent Results (from the past 240 hour(s))  URINE CULTURE     Status: None   Collection Time    01/15/13  6:11 PM      Result Value Range Status   Specimen Description URINE, RANDOM   Final   Special Requests NONE   Final  Culture  Setup Time 01/15/2013 20:54   Final   Colony Count PENDING   Incomplete   Culture Culture reincubated for better growth   Final   Report Status PENDING   Incomplete     Studies: Dg Thoracic Spine 2 View  01/15/2013   *RADIOLOGY REPORT*  Clinical Data: Larey Seat today, back pain  THORACIC SPINE - 2 VIEW  Comparison: 05/05/2012  Findings: 12 pairs of ribs. Superior endplate compression deformity of an upper thoracic vertebra is unchanged. Diffuse osseous demineralization. No acute fracture, subluxation or bone destruction. Visualized portions of the posterior ribs are unremarkable. Atherosclerotic calcification aortic arch.  IMPRESSION: Osseous demineralization. Chronic old compression deformity of an upper thoracic vertebra, stable. No acute abnormalities.   Original Report Authenticated By: Ulyses Southward, M.D.    Dg Elbow Complete Left  01/15/2013   *RADIOLOGY REPORT*  Clinical Data: History of injury from fall with pain.  Bruising.  LEFT ELBOW - COMPLETE 3+ VIEW  Comparison: None.  Findings: There is no positive fat pad sign seen to suggest joint effusion.  Surgical staples are seen in the soft tissues of the anterior aspect of the elbow. Alignment is normal.  Joint spaces are preserved.  No fracture or dislocation is evident.  No soft tissue lesions are seen.  IMPRESSION: No evidence of fracture, dislocation, or joint effusion.   Original Report Authenticated By: Onalee Hua Call   Ct Head Wo Contrast  01/15/2013   *RADIOLOGY REPORT*  Clinical Data: Altered mental status  CT HEAD WITHOUT CONTRAST  Technique:  Contiguous axial images were obtained from the base of the skull through the vertex without contrast.  Comparison: CT scan of January 15, 2012.  Findings: Bony calvarium appears intact.  Mild diffuse cortical atrophy is noted.  Chronic ischemic white matter disease is noted. Cavum septum pellucidum is again noted. No mass effect or midline shift is noted.  Ventricular size is within normal limits.  There is no evidence of mass lesion, hemorrhage or acute infarction.  IMPRESSION: Mild diffuse cortical atrophy.  Chronic ischemic white matter disease.  No acute intracranial abnormality seen.   Original Report Authenticated By: Lupita Raider.,  M.D.   Dg Abd 2 Views  01/15/2013   *RADIOLOGY REPORT*  Clinical Data: Abdominal pain, fell today, multiple complaints  ABDOMEN - 2 VIEW  Comparison: 05/27/2012  Findings: Lung bases emphysematous but clear. Mildly prominent stool in colon. Gas present rectum. No evidence of bowel wall thickening, obstruction or free air. Bones diffusely demineralized with note of a left hip prosthesis. No definite urinary tract calcification. Injection granulomata project over the iliac wings bilaterally.  IMPRESSION: Mildly prominent stool in colon. Emphysematous changes. No acute abdominal  findings.   Original Report Authenticated By: Ulyses Southward, M.D.   Scheduled Meds: . ALPRAZolam  0.5 mg Oral QHS  . aspirin EC  81 mg Oral Daily  . cefTRIAXone (ROCEPHIN)  IV  1 g Intravenous Q24H  . doxepin  50 mg Oral QHS  . feeding supplement  1 Container Oral QPC supper  . heparin  5,000 Units Subcutaneous Q8H  . omega-3 acid ethyl esters  1 g Oral TID AC & HS  . pantoprazole  40 mg Oral Daily  . pyridOXINE  100 mg Oral Daily  . sodium bicarbonate  1,300 mg Oral BID  . sodium chloride  3 mL Intravenous Q12H   Continuous Infusions: . 0.9 % NaCl with KCl 40 mEq / L 100 mL/hr at 01/17/13 1610    Time spent:  35 minutes  Angeni Chaudhuri L  [facility] Pager [phone]. If 7PM-7AM, please contact night-coverage at [email or web address], password [facility] 01/17/2013, 2:13 PM  LOS: 2 days

## 2013-01-18 LAB — URINE CULTURE

## 2013-01-18 LAB — BASIC METABOLIC PANEL
CO2: 16 mEq/L — ABNORMAL LOW (ref 19–32)
Chloride: 112 mEq/L (ref 96–112)
Creatinine, Ser: 1.4 mg/dL — ABNORMAL HIGH (ref 0.50–1.10)
GFR calc Af Amer: 42 mL/min — ABNORMAL LOW (ref >90)
Sodium: 138 mEq/L (ref 135–145)

## 2013-01-18 MED ORDER — ALPRAZOLAM 0.5 MG PO TABS
0.5000 mg | ORAL_TABLET | Freq: Three times a day (TID) | ORAL | Status: DC | PRN
  Administered 2013-01-18: 0.5 mg via ORAL
  Filled 2013-01-18: qty 1

## 2013-01-18 MED ORDER — CAMPHOR-MENTHOL 0.5-0.5 % EX LOTN
TOPICAL_LOTION | CUTANEOUS | Status: DC | PRN
Start: 2013-01-18 — End: 2013-01-19
  Filled 2013-01-18: qty 222

## 2013-01-18 MED ORDER — CARVEDILOL 25 MG PO TABS
25.0000 mg | ORAL_TABLET | Freq: Two times a day (BID) | ORAL | Status: DC
  Administered 2013-01-18 – 2013-01-19 (×2): 25 mg via ORAL
  Filled 2013-01-18 (×4): qty 1

## 2013-01-18 MED ORDER — HYDROCODONE-ACETAMINOPHEN 5-325 MG PO TABS
1.0000 | ORAL_TABLET | Freq: Four times a day (QID) | ORAL | Status: DC | PRN
  Administered 2013-01-18 – 2013-01-19 (×5): 1 via ORAL
  Filled 2013-01-18 (×5): qty 1

## 2013-01-18 NOTE — Progress Notes (Signed)
Chart reviewed.  [facility] PROGRESS NOTE  [name] AVW:[id number] DOB: [birth year] DOA: 01/15/2013 PCP: Melody Hippo, MD  Assessment/Plan:  Principal Problem:   UTI (lower urinary tract infection) Enterobacter and E coli: both sensitive to Rocephin Active Problems:   Hypokalemia: resolved   Weakness: ST SNF   Nausea & vomiting secondary to above. None currently.   Renal failure (ARF), acute on chronic: resolved. Slow down IVF   Chronic systolic heart failure: No evidence of CHF currently   Metabolic acidosis: Lactate normal.   Encephalopathy, toxic:  secondary to above. improving   Anemia: Monitor   Anxiety: xanax qhs and prn   Fall at home   Hypertension: increase coreg   Hyponatremia: Resolved  Code Status: Full Family Communication: daughter Disposition Plan: SNF  Consultants:    Procedures:    Antibiotics:  Rocephin  HPI/Subjective: Walked around the unit with assistance  Objective: Filed Vitals:   01/17/13 1414 01/17/13 2123 01/18/13 0518 01/18/13 1434  BP: 136/63 140/56 122/58 150/65  Pulse: 63 67 63 57  Temp: 97.8 F (36.6 C) 98.1 F (36.7 C) 98 F (36.7 C) 98.1 F (36.7 C)  TempSrc:  Oral Oral Oral  Resp: 18 18 18 19   Height:      Weight:   57.7 kg (127 lb 3.3 oz)   SpO2: 100% 100% 99% 99%    Intake/Output Summary (Last 24 hours) at 01/18/13 1631 Last data filed at 01/18/13 0954  Gross per 24 hour  Intake    240 ml  Output   2750 ml  Net  -2510 ml   Filed Weights   01/15/13 2306 01/17/13 0502 01/18/13 0518  Weight: 57.561 kg (126 lb 14.4 oz) 57.607 kg (127 lb) 57.7 kg (127 lb 3.3 oz)    Exam:   General:  Awake. Comfortable. Oriented to place and person. Cooperative.  Cardiovascular: Regular rate rhythm without murmurs gallops rubs  Respiratory: Clear to auscultation bilaterally without wheeze rhonchi or rales  Abdomen: Soft nontender. Bowel sounds present  Extremities: No clubbing cyanosis or edema  Data  Reviewed: Basic Metabolic Panel:  Recent Labs Lab 01/15/13 1746 01/16/13 0520 01/17/13 0450 01/18/13 0530  NA 129* 140 139 138  K 3.1* 2.7* 3.4* 4.1  CL 100 113* 112 112  CO2 13* 12* 15* 16*  GLUCOSE 86 80 85 85  BUN 74* 58* 31* 23  CREATININE 2.83* 2.04* 1.55* 1.40*  CALCIUM 9.3 8.6 8.4 8.9  MG  --  2.1  --   --    Liver Function Tests:  Recent Labs Lab 01/15/13 1746 01/16/13 0520  AST 12 13  ALT 14 12  ALKPHOS 55 45  BILITOT 0.2* 0.2*  PROT 6.4 5.6*  ALBUMIN 3.4* 2.9*   No results found for this basename: LIPASE, AMYLASE,  in the last 168 hours  Recent Labs Lab 01/15/13 1746  AMMONIA 36   CBC:  Recent Labs Lab 01/15/13 1746 01/16/13 0520  WBC 9.0 8.9  NEUTROABS 6.1 6.3  HGB 10.6* 10.5*  HCT 29.7* 28.4*  MCV 92.8 92.5  PLT 249 239   Cardiac Enzymes:  Recent Labs Lab 01/16/13 0034  TROPONINI <0.30   BNP (last 3 results) No results found for this basename: PROBNP,  in the last 8760 hours CBG: No results found for this basename: GLUCAP,  in the last 168 hours  Recent Results (from the past 240 hour(s))  URINE CULTURE     Status: None   Collection Time    01/15/13  6:11 PM      Result Value Range Status   Specimen Description URINE, RANDOM   Final   Special Requests NONE   Final   Culture  Setup Time 01/15/2013 20:54   Final   Colony Count >=100,000 COLONIES/ML   Final   Culture     Final   Value: ENTEROBACTER CLOACAE     ESCHERICHIA COLI   Report Status 01/18/2013 FINAL   Final   Organism ID, Bacteria ENTEROBACTER CLOACAE   Final   Organism ID, Bacteria ESCHERICHIA COLI   Final     Studies: No results found. Scheduled Meds: . ALPRAZolam  0.5 mg Oral QHS  . aspirin EC  81 mg Oral Daily  . carvedilol  6.25 mg Oral BID WC  . cefTRIAXone (ROCEPHIN)  IV  1 g Intravenous Q24H  . doxepin  50 mg Oral QHS  . feeding supplement  1 Container Oral QPC supper  . heparin  5,000 Units Subcutaneous Q8H  . omega-3 acid ethyl esters  1 g Oral TID  AC & HS  . pantoprazole  40 mg Oral Daily  . potassium chloride  20 mEq Oral BID  . pyridOXINE  100 mg Oral Daily  . sodium bicarbonate  1,300 mg Oral BID  . sodium chloride  3 mL Intravenous Q12H   Continuous Infusions: . 0.9 % NaCl with KCl 40 mEq / L 20 mL/hr (01/17/13 1504)    Time spent: 20 minutes  [name] L  [facility] Pager [phone]. If 7PM-7AM, please contact night-coverage at [email or web address], password [facility] 01/18/2013, 4:31 PM  LOS: 3 days

## 2013-01-19 ENCOUNTER — Encounter (HOSPITAL_COMMUNITY): Payer: Self-pay | Admitting: Internal Medicine

## 2013-01-19 MED ORDER — ACETAMINOPHEN 325 MG PO TABS: 650.0000 mg | ORAL_TABLET | Freq: Four times a day (QID) | ORAL | Status: DC | PRN

## 2013-01-19 MED ORDER — ALPRAZOLAM 0.5 MG PO TABS: 0.5000 mg | ORAL_TABLET | Freq: Three times a day (TID) | ORAL | Status: DC

## 2013-01-19 MED ORDER — HYDROCODONE-ACETAMINOPHEN 5-325 MG PO TABS: 1.0000 | ORAL_TABLET | Freq: Three times a day (TID) | ORAL | Status: DC | PRN

## 2013-01-19 MED ORDER — BOOST / RESOURCE BREEZE PO LIQD: 1.0000 | Freq: Every day | ORAL | Status: DC

## 2013-01-19 MED ORDER — CEFUROXIME AXETIL 500 MG PO TABS
500.0000 mg | ORAL_TABLET | Freq: Two times a day (BID) | ORAL | Status: DC
  Filled 2013-01-19 (×2): qty 1

## 2013-01-19 MED ORDER — ALPRAZOLAM 0.5 MG PO TABS
0.5000 mg | ORAL_TABLET | Freq: Three times a day (TID) | ORAL | Status: DC
  Administered 2013-01-19: 0.5 mg via ORAL
  Filled 2013-01-19: qty 1

## 2013-01-19 MED ORDER — CEFUROXIME AXETIL 500 MG PO TABS: 500.0000 mg | ORAL_TABLET | Freq: Two times a day (BID) | ORAL | Status: DC

## 2013-01-19 NOTE — Progress Notes (Signed)
Physical Therapy Treatment Patient Details Name: BRANDON SCARBROUGH MRN: [id number] DOB: 10/30/1940 Today's Date: 01/19/2013 Time: 2130-8657 PT Time Calculation (min): 25 min  PT Assessment / Plan / Recommendation  History of Present Illness  LEYTON BROWNLEE is a 72 y.o. female with history of S/p nephrectomy, bladder removal with Right urostomy/ileal conduit, chronic kidney disease, CHF presented to the hospital because of weakness and falls. As per patient's family patient has been feeling weak with falls and also has poor appetite for last few days with nausea and vomiting. Denies any diarrhea or any abdominal pain. In the ER patient was found to be hypotensive and elevated creatinine for baseline. Patient has been admitted for further management. Patient otherwise denies any chest pain or shortness of breath. Patient is afebrile. UA shows features of UTI.     Clinical Impression treatment session focused on increasing amb and performing dynamic gt activities as well as exercises and static standing balance exercises. Pt is a fall risk and presents with decreased safety awareness and decreased awareness of balance deficits. Pt hopes to D/C to [facility] today for continued rehab.    PT Comments   Pt needs continued rehab to improve balance deficits and reduce risk of falls.   Follow Up Recommendations  SNF     Does the patient have the potential to tolerate intense rehabilitation     Barriers to Discharge        Equipment Recommendations  Other (comment) (TBD at SNF)    Recommendations for Other Services    Frequency Min 3X/week   Progress towards PT Goals Progress towards PT goals: Progressing toward goals  Plan Current plan remains appropriate    Precautions / Restrictions Precautions Precautions: Fall Restrictions Weight Bearing Restrictions: No   Pertinent Vitals/Pain 7/10 in L kidney     Mobility  Bed Mobility Bed Mobility: Not assessed Details for Bed Mobility  Assistance: pt sitting in chair and returned to chair Transfers Transfers: Sit to Stand;Stand to Sit Sit to Stand: 4: Min guard;From chair/3-in-1;With armrests Stand to Sit: 4: Min guard;To chair/3-in-1;With armrests Details for Transfer Assistance: min guard for safety and to maintain balance; vc's for hand placement  Ambulation/Gait Ambulation/Gait Assistance: 4: Min assist Ambulation Distance (Feet): 400 Feet Assistive device: Straight cane;1 person hand held assist Ambulation/Gait Assistance Details: pt very unsteady throughout; insists on amb with cane vs RW; pt demo inability to perform dynamic gt without (A); pt became off balance minimally with directonal changes and head turns; vc's to increase BOS and focus on RW; pt easily distractable  Gait Pattern: Step-through pattern;Narrow base of support;Scissoring Gait velocity: unable to safely increase at this time  Stairs: No Wheelchair Mobility Wheelchair Mobility: No    Exercises General Exercises - Lower Extremity Ankle Circles/Pumps: AROM;Both;10 reps;Seated Long Arc Quad: AROM;Both;10 reps;Seated Hip Flexion/Marching: AROM;10 reps;Seated;Standing Mini-Sqauts: AROM;5 reps   PT Diagnosis:    PT Problem List:   PT Treatment Interventions:     PT Goals (current goals can now be found in the care plan section) Acute Rehab PT Goals Patient Stated Goal: Eventually get back home PT Goal Formulation: With patient/family Time For Goal Achievement: 01/31/13 Potential to Achieve Goals: Good  Visit Information  Last PT Received On: 01/19/13 Assistance Needed: +1 History of Present Illness:  TINIE MCGLOIN is a 72 y.o. female with history of S/p nephrectomy, bladder removal with Right urostomy/ileal conduit, chronic kidney disease, CHF presented to the hospital because of weakness and falls. As per patient's  family patient has been feeling weak with falls and also has poor appetite for last few days with nausea and vomiting. Denies  any diarrhea or any abdominal pain. In the ER patient was found to be hypotensive and elevated creatinine for baseline. Patient has been admitted for further management. Patient otherwise denies any chest pain or shortness of breath. Patient is afebrile. UA shows features of UTI.      Subjective Data  Subjective: Pt sitting in chair; agreeable to therapy. states "id rather walk with my cane to strengthen my legs"  Patient Stated Goal: Eventually get back home   Cognition  Cognition Arousal/Alertness: Awake/alert Behavior During Therapy: WFL for tasks assessed/performed Overall Cognitive Status: Within Functional Limits for tasks assessed    Balance  Balance Balance Assessed: Yes Static Standing Balance Static Standing - Balance Support: Right upper extremity supported;During functional activity;No upper extremity supported Static Standing - Level of Assistance: 4: Min assist;5: Stand by assistance Static Standing - Comment/# of Minutes: pt unsteady when she becomes distracted or performs head turns Tandem Stance - Right Leg:  (<10sec with continuous Hand held (A) ) Tandem Stance - Left Leg:  (<10 sec with continuous hand held (A) ) Rhomberg - Eyes Opened: 0.5 [place] - Eyes Closed: 0.5 High Level Balance High Level Balance Activites: Direction changes;Turns;Sudden stops;Head turns High Level Balance Comments: pt required min (A) to maintain balance; has difficulty with dynamic gt activities  End of Session PT - End of Session Equipment Utilized During Treatment: Gait belt Activity Tolerance: Patient tolerated treatment well Patient left: in chair;with chair alarm set Nurse Communication: Mobility status   GP     Donell Sievert, [place] [phone] 01/19/2013, 9:15 AM

## 2013-01-19 NOTE — Clinical Social Work Placement (Addendum)
Clinical Social Work Department CLINICAL SOCIAL WORK PLACEMENT NOTE 01/19/2013  Patient:  [name], [name]  Account Number:  [id number] Admit date:  01/15/2013  Clinical Social Worker:  Hulan Fray  Date/time:  01/19/2013 10:59 AM  Clinical Social Work is seeking post-discharge placement for this patient at the following level of care:   SKILLED NURSING   (*CSW will update this form in Epic as items are completed)   01/19/2013  Patient/family provided with [facility] Department of Clinical Social Work's list of facilities offering this level of care within the geographic area requested by the patient (or if unable, by the patient's family).  01/19/2013  Patient/family informed of their freedom to choose among providers that offer the needed level of care, that participate in Medicare, Medicaid or managed care program needed by the patient, have an available bed and are willing to accept the patient.  01/19/2013  Patient/family informed of [facility]' ownership interest in [facility], as well as of the fact that they are under no obligation to receive care at this facility.  PASARR submitted to EDS on Existing # PASARR number received from EDS on [id number]   FL2 transmitted to all facilities in geographic area requested by pt/family on  01/19/2013 FL2 transmitted to all facilities within larger geographic area on   Patient informed that his/her managed care company has contracts with or will negotiate with  certain facilities, including the following:     Patient/family informed of bed offers received:  01-19-13 Patient chooses bed at [facility] Physician recommends and patient chooses bed at    Patient to be transferred to [facility] on  01-19-13 Patient to be transferred to facility by [facility]  The following physician request were entered in Epic:   Additional Comments:

## 2013-01-19 NOTE — Progress Notes (Signed)
Pt discharged

## 2013-01-19 NOTE — Discharge Summary (Signed)
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 female with history of S/p nephrectomy, bladder removal with Right urostomy/ileal conduit, chronic kidney disease, CHF presented to the year because of weakness and falls. As per patient's family patient has been feeling weak with falls and also has poor appetite for last few days with nausea and vomiting. Denies any diarrhea or any abdominal pain. In the ER patient was found to be hypotensive and elevated creatinine for baseline. Patient has been admitted for further management. Patient otherwise denies any chest pain or shortness of breath. Patient is afebrile. UA shows features of UTI.   Hospital Course:  Admitted to hospitalist service. Blood pressure medications were held, as blood pressure was borderline low on admission. She was also in acute renal failure. Her baseline creatinine is about 1.30 or so. Her potassium was low. She was started on IV fluids, IV potassium repletion. Magnesium was normal. IV Rocephin. She does have an ileal conduit, so urinalysis is difficult to interpret. Based on her clinical symptoms this was likely a true urinary tract  infection. She responded well to fluids, electrolytes correction, Rocephin. Her blood pressure increased steadily and her medications have been added back carefully. She remains off the Norvasc. She carries a history of previous systolic dysfunction, but last echocardiogram showed a normal ejection fraction. She had no evidence of heart failure during this hospitalization. She worked with physical therapy and she and PT recommend skilled nursing facility placement short term. Her vomiting has resolved. She is tolerating a diet. Her renal function is about back to baseline. Cultures showed Enterobacter and Escherichia coli in the urine, both sensitive to Rocephin. She will be transitioned to Ceftin.  Consultations:  Physical therapy  Discharge Exam: Filed Vitals:   01/18/13 0518 01/18/13 1434 01/18/13 2116 01/19/13 0507  BP: 122/58 150/65 144/64 135/52  Pulse: 63 57 58 60  Temp: 98 F (36.7 C) 98.1 F (36.7 C) 98.5 F (36.9 C) 98.7 F (37.1 C)  TempSrc: Oral Oral Oral Oral  Resp: 18 19 18 18   Height:      Weight: 57.7 kg (127 lb 3.3 oz)   58.1 kg (128 lb 1.4 oz)  SpO2: 99% 99% 99% 99%    General: Alert. Oriented. Slightly tremulous. Seated in chair. Cardiovascular: Regular rate rhythm without murmurs gallops or Respiratory: Clear to auscultation bilaterally without wheezes rhonchi or rales Abdomen soft nontender nondistended Extremities no clubbing cyanosis or edema  Discharge Instructions  Discharge Orders   Future Orders Complete By Expires     Diet - low sodium heart healthy  As directed     Discharge instructions  As directed  Comments:      Empty urostomy bag prn. Check basic metabolic panel within one week.    Walk with assistance  As directed         Medication List    STOP taking these medications       amLODipine 5 MG tablet  Commonly known as:  NORVASC      TAKE these medications       acetaminophen 325 MG tablet  Commonly known as:  TYLENOL  Take 2  tablets (650 mg total) by mouth every 6 (six) hours as needed.     alendronate 70 MG tablet  Commonly known as:  FOSAMAX  Take 70 mg by mouth every 7 (seven) days. Take with a full glass of water on an empty stomach. Takes on Sunday.     ALPRAZolam 0.5 MG tablet  Commonly known as:  XANAX  Take 1 tablet (0.5 mg total) by mouth 3 (three) times daily.     aspirin 81 MG tablet  Take 81 mg by mouth daily.     calcium citrate-vitamin D 200-200 MG-UNIT Tabs  Take 1 tablet by mouth daily.     carvedilol 25 MG tablet  Commonly known as:  COREG  Take 1 tablet (25 mg total) by mouth 2 (two) times daily with a meal.     cefUROXime 500 MG tablet  Commonly known as:  CEFTIN  Take 1 tablet (500 mg total) by mouth 2 (two) times daily with a meal. Until 7/31, then stop     doxepin 50 MG capsule  Commonly known as:  SINEQUAN  TAKE (1) CAPSULE DAILY AT BEDTIME.     feeding supplement Liqd  Take 1 Container by mouth daily after supper.     ferrous gluconate 325 MG tablet  Commonly known as:  FERGON  Take 325 mg by mouth daily with breakfast.     fish oil-omega-3 fatty acids 1000 MG capsule  Take 1 g by mouth 4 (four) times daily.     hydrALAZINE 10 MG tablet  Commonly known as:  APRESOLINE  Take 1 tablet (10 mg total) by mouth 3 (three) times daily.     HYDROcodone-acetaminophen 5-325 MG per tablet  Commonly known as:  NORCO/VICODIN  Take 1 tablet by mouth every 8 (eight) hours as needed for pain.     multivitamin tablet  Take 1 tablet by mouth daily.     nitroGLYCERIN 0.4 MG SL tablet  Commonly known as:  NITROSTAT  Place 1 tablet (0.4 mg total) under the tongue every 5 (five) minutes as needed. For chest pain     omeprazole 20 MG capsule  Commonly known as:  PRILOSEC  Take 1 capsule (20 mg total) by mouth daily.     pyridOXINE 100 MG tablet  Commonly known as:  VITAMIN B-6  Take 100 mg by mouth daily.     sodium bicarbonate 650 MG tablet  Take 1,300 mg by mouth 2 (two)  times daily.     vitamin C 500 MG tablet  Commonly known as:  ASCORBIC ACID  Take 500 mg by mouth daily.       Allergies  Allergen Reactions  . Ketorolac Tromethamine Rash       Follow-up Information   Follow up with REHMAN,NAJEEB U, MD In 4 weeks.   Contact information:   [address], [address] [place] [address] [phone]        The results of significant diagnostics from this hospitalization (including  imaging, microbiology, ancillary and laboratory) are listed below for reference.    Significant Diagnostic Studies: Dg Thoracic Spine 2 View  01/15/2013   *RADIOLOGY REPORT*  Clinical Data: Larey Seat today, back pain  THORACIC SPINE - 2 VIEW  Comparison: 05/05/2012  Findings: 12 pairs of ribs. Superior endplate compression deformity of an upper thoracic vertebra is unchanged. Diffuse osseous demineralization. No acute fracture, subluxation or bone destruction. Visualized portions of the posterior ribs are unremarkable. Atherosclerotic calcification aortic arch.  IMPRESSION: Osseous demineralization. Chronic old compression deformity of an upper thoracic vertebra, stable. No acute abnormalities.   Original Report Authenticated By: Ulyses Southward, M.D.   Dg Elbow Complete Left  01/15/2013   *RADIOLOGY REPORT*  Clinical Data: History of injury from fall with pain.  Bruising.  LEFT ELBOW - COMPLETE 3+ VIEW  Comparison: None.  Findings: There is no positive fat pad sign seen to suggest joint effusion.  Surgical staples are seen in the soft tissues of the anterior aspect of the elbow. Alignment is normal.  Joint spaces are preserved.  No fracture or dislocation is evident.  No soft tissue lesions are seen.  IMPRESSION: No evidence of fracture, dislocation, or joint effusion.   Original Report Authenticated By: Onalee Hua Call   Ct Head Wo Contrast  01/15/2013   *RADIOLOGY REPORT*  Clinical Data: Altered mental status  CT HEAD WITHOUT CONTRAST  Technique:  Contiguous axial images were obtained  from the base of the skull through the vertex without contrast.  Comparison: CT scan of January 15, 2012.  Findings: Bony calvarium appears intact.  Mild diffuse cortical atrophy is noted.  Chronic ischemic white matter disease is noted. Cavum septum pellucidum is again noted. No mass effect or midline shift is noted.  Ventricular size is within normal limits.  There is no evidence of mass lesion, hemorrhage or acute infarction.  IMPRESSION: Mild diffuse cortical atrophy.  Chronic ischemic white matter disease.  No acute intracranial abnormality seen.   Original Report Authenticated By: Lupita Raider.,  M.D.   Dg Abd 2 Views  01/15/2013   *RADIOLOGY REPORT*  Clinical Data: Abdominal pain, fell today, multiple complaints  ABDOMEN - 2 VIEW  Comparison: 05/27/2012  Findings: Lung bases emphysematous but clear. Mildly prominent stool in colon. Gas present rectum. No evidence of bowel wall thickening, obstruction or free air. Bones diffusely demineralized with note of a left hip prosthesis. No definite urinary tract calcification. Injection granulomata project over the iliac wings bilaterally.  IMPRESSION: Mildly prominent stool in colon. Emphysematous changes. No acute abdominal findings.   Original Report Authenticated By: Ulyses Southward, M.D.    Microbiology: Recent Results (from the past 240 hour(s))  URINE CULTURE     Status: None   Collection Time    01/15/13  6:11 PM      Result Value Range Status   Specimen Description URINE, RANDOM   Final   Special Requests NONE   Final   Culture  Setup Time 01/15/2013 20:54   Final   Colony Count >=100,000 COLONIES/ML   Final   Culture     Final   Value: ENTEROBACTER CLOACAE     ESCHERICHIA COLI   Report Status 01/18/2013 FINAL   Final   Organism ID, Bacteria ENTEROBACTER CLOACAE   Final   Organism ID, Bacteria ESCHERICHIA COLI   Final     Labs: Basic Metabolic Panel:  Recent Labs Lab 01/15/13 1746 01/16/13 0520 01/17/13 0450 01/18/13 0530  NA 129*  140 139 138  K 3.1*  2.7* 3.4* 4.1  CL 100 113* 112 112  CO2 13* 12* 15* 16*  GLUCOSE 86 80 85 85  BUN 74* 58* 31* 23  CREATININE 2.83* 2.04* 1.55* 1.40*  CALCIUM 9.3 8.6 8.4 8.9  MG  --  2.1  --   --    Liver Function Tests:  Recent Labs Lab 01/15/13 1746 01/16/13 0520  AST 12 13  ALT 14 12  ALKPHOS 55 45  BILITOT 0.2* 0.2*  PROT 6.4 5.6*  ALBUMIN 3.4* 2.9*   No results found for this basename: LIPASE, AMYLASE,  in the last 168 hours  Recent Labs Lab 01/15/13 1746  AMMONIA 36   CBC:  Recent Labs Lab 01/15/13 1746 01/16/13 0520  WBC 9.0 8.9  NEUTROABS 6.1 6.3  HGB 10.6* 10.5*  HCT 29.7* 28.4*  MCV 92.8 92.5  PLT 249 239   Cardiac Enzymes:  Recent Labs Lab 01/16/13 0034  TROPONINI <0.30   BNP: BNP (last 3 results) No results found for this basename: PROBNP,  in the last 8760 hours CBG: No results found for this basename: GLUCAP,  in the last 168 hours  Signed:  Breianna Delfino L  [facility] 01/19/2013, 9:57 AM

## 2013-01-19 NOTE — Clinical Social Work Note (Addendum)
Clinical Social Work Department BRIEF PSYCHOSOCIAL ASSESSMENT 01/19/2013  Patient:  [name], [name]     Account Number:  [id number]     Admit date:  01/15/2013  Clinical Social Worker:  Hulan Fray  Date/Time:  01/19/2013 10:30 AM  Referred by:  Physician  Date Referred:  01/17/2013 Referred for  SNF Placement   Other Referral:   Interview type:  Family Other interview type:   husband- [name]    PSYCHOSOCIAL DATA Living Status:  HUSBAND Admitted from facility:   Level of care:   Primary support name:  [name] Primary support relationship to patient:  SPOUSE Degree of support available:   supportive    CURRENT CONCERNS Current Concerns  Post-Acute Placement   Other Concerns:    SOCIAL WORK ASSESSMENT / PLAN Clinical Social Worker received a referral for patient needing short term SNF placement at discharge. CSW called husband and introduced self and explained reason for visit. Husband verified preference for [facility] SNF. CSW explained SNF process to husband. Husband reported that he would prefer patient to be transferred by non-emergent ambulance.    CSW completed FL2 for MD to sign and will update husband when bed offers are made.   Assessment/plan status:  Psychosocial Support/Ongoing Assessment of Needs Other assessment/ plan:   CSW facilitated discharge by contacting family and facility. Patient will be transported via ambulance. CSW will complete discharge packet and place with shadow chart. CSW will sign off, as social work intervention is no longer needed.  Information/referral to community resources:   Husband is preferring [facility]    PATIENT'S/FAMILY'S RESPONSE TO PLAN OF CARE: Per husband, he is preferring patient to be transferred to [facility]. Husband is agreeable for CSW to initate SNF search and patient to be transpoted via ambulance when medically stable.

## 2013-03-09 ENCOUNTER — Telehealth (INDEPENDENT_AMBULATORY_CARE_PROVIDER_SITE_OTHER): Payer: Self-pay | Admitting: *Deleted

## 2013-03-09 NOTE — Telephone Encounter (Signed)
Cesar, a PT Therapist , called and ask for a verbal order to preform PT 2 times a week for 2 weeks. Per Dr. Karilyn Cota may return call and give verbal order for the above to be done. Maureen Ralphs called and given verbal order.

## 2013-03-11 ENCOUNTER — Other Ambulatory Visit (INDEPENDENT_AMBULATORY_CARE_PROVIDER_SITE_OTHER): Payer: Self-pay | Admitting: Internal Medicine

## 2013-03-11 ENCOUNTER — Other Ambulatory Visit: Payer: Self-pay | Admitting: Cardiovascular Disease

## 2013-03-11 DIAGNOSIS — R109 Unspecified abdominal pain: Secondary | ICD-10-CM

## 2013-03-11 MED ORDER — ALPRAZOLAM 0.5 MG PO TABS: 0.5000 mg | ORAL_TABLET | Freq: Three times a day (TID) | ORAL | Status: DC

## 2013-03-11 MED ORDER — HYDROCODONE-ACETAMINOPHEN 5-325 MG PO TABS: 1.0000 | ORAL_TABLET | Freq: Three times a day (TID) | ORAL | Status: DC | PRN

## 2013-03-23 ENCOUNTER — Telehealth (INDEPENDENT_AMBULATORY_CARE_PROVIDER_SITE_OTHER): Payer: Self-pay | Admitting: *Deleted

## 2013-03-23 NOTE — Telephone Encounter (Signed)
Our office rec'd a call from Casear,PT with home health agency. She was asking if Dr.Rehman would allow her to provide additional PT for patient. Starting this week ,2 times a week for additional 2 weeks. Per Dr.Rehman this was [name] was called and made aware.

## 2013-03-30 ENCOUNTER — Telehealth (INDEPENDENT_AMBULATORY_CARE_PROVIDER_SITE_OTHER): Payer: Self-pay | Admitting: *Deleted

## 2013-03-30 NOTE — Telephone Encounter (Signed)
[name], PT from [facility], went out to see Haylea on Friday, 03/27/13, and she was not home. Wonder had went out with family for her [place] Ouch. Ramond Dial will continue to see [name] this week, then the next will be there last. If any further question or concerns, please call [phone].

## 2013-03-30 NOTE — Telephone Encounter (Signed)
Noted and forwarded to Dr.Rehman for review.

## 2013-03-31 ENCOUNTER — Encounter: Payer: Self-pay | Admitting: Cardiovascular Disease

## 2013-03-31 DIAGNOSIS — R471 Dysarthria and anarthria: Secondary | ICD-10-CM

## 2013-03-31 DIAGNOSIS — R609 Edema, unspecified: Secondary | ICD-10-CM

## 2013-03-31 HISTORY — DX: Edema, unspecified: R60.9

## 2013-03-31 HISTORY — DX: Dysarthria and anarthria: R47.1

## 2013-03-31 NOTE — Telephone Encounter (Signed)
Agree with above;  Name misspelled.

## 2013-04-06 ENCOUNTER — Other Ambulatory Visit (INDEPENDENT_AMBULATORY_CARE_PROVIDER_SITE_OTHER): Payer: Self-pay | Admitting: Internal Medicine

## 2013-04-06 DIAGNOSIS — K219 Gastro-esophageal reflux disease without esophagitis: Secondary | ICD-10-CM

## 2013-04-06 MED ORDER — ALPRAZOLAM 0.5 MG PO TABS: 0.5000 mg | ORAL_TABLET | Freq: Three times a day (TID) | ORAL | Status: DC

## 2013-04-06 NOTE — Telephone Encounter (Signed)
Rx written for patient. Printer is not working

## 2013-04-13 ENCOUNTER — Other Ambulatory Visit (INDEPENDENT_AMBULATORY_CARE_PROVIDER_SITE_OTHER): Payer: Self-pay | Admitting: Internal Medicine

## 2013-04-13 DIAGNOSIS — R109 Unspecified abdominal pain: Secondary | ICD-10-CM

## 2013-04-13 MED ORDER — HYDROCODONE-ACETAMINOPHEN 5-325 MG PO TABS: 1.0000 | ORAL_TABLET | Freq: Three times a day (TID) | ORAL | Status: DC | PRN

## 2013-04-13 NOTE — Telephone Encounter (Signed)
Rx for Hydrocodone. No refills.

## 2013-04-14 ENCOUNTER — Telehealth (INDEPENDENT_AMBULATORY_CARE_PROVIDER_SITE_OTHER): Payer: Self-pay | Admitting: *Deleted

## 2013-04-14 NOTE — Telephone Encounter (Signed)
Forwarded to Dr.Rehman to address. 

## 2013-04-14 NOTE — Telephone Encounter (Signed)
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    [facility] will be taking over her care after she is DC with them on 11/3.  They will not supply her urostomy supplies until then.  She is asking that Dr. Karilyn Cota give them or write the order for them to continue her urostomy supplies until [facility] takes over 04/27/2013.  If any question please call her on her cell phone [phone].

## 2013-04-15 ENCOUNTER — Other Ambulatory Visit (INDEPENDENT_AMBULATORY_CARE_PROVIDER_SITE_OTHER): Payer: Self-pay | Admitting: *Deleted

## 2013-04-15 DIAGNOSIS — R109 Unspecified abdominal pain: Secondary | ICD-10-CM

## 2013-04-15 MED ORDER — HYDROCODONE-ACETAMINOPHEN 5-325 MG PO TABS: 1.0000 | ORAL_TABLET | Freq: Three times a day (TID) | ORAL | Status: DC | PRN

## 2013-04-15 NOTE — Telephone Encounter (Signed)
Our office rec'd a fax request for the medication refill, forwarded to NUR to address

## 2013-04-15 NOTE — Telephone Encounter (Signed)
Prescription for hydrocodone/acetaminophen filled

## 2013-04-16 ENCOUNTER — Other Ambulatory Visit (INDEPENDENT_AMBULATORY_CARE_PROVIDER_SITE_OTHER): Payer: Self-pay | Admitting: Internal Medicine

## 2013-04-16 DIAGNOSIS — R109 Unspecified abdominal pain: Secondary | ICD-10-CM

## 2013-04-16 MED ORDER — HYDROCODONE-ACETAMINOPHEN 5-325 MG PO TABS: 1.0000 | ORAL_TABLET | Freq: Three times a day (TID) | ORAL | Status: DC | PRN

## 2013-04-16 NOTE — Progress Notes (Signed)
Already addressed

## 2013-04-23 ENCOUNTER — Telehealth: Payer: Self-pay | Admitting: Cardiovascular Disease

## 2013-04-23 NOTE — Telephone Encounter (Signed)
Pt c/o LE edema worse on left foot, denies redness/ no warmth to touch. Pt was offered an app with pa/np but declines at this time. Pt will watch sodium intake/ high sodium foods to avoid were reviewed,  Pt will wear support stockings that she has available at home, Pt will elevate legs 3-4 x day for 15 - 30 minutes daily. Pt was rx lasix by pcp and was told to continue as they prescribed but may f/u there also. Pt was told to call with worsening sx or SOB. Pt agreed to plan.

## 2013-04-23 NOTE — Telephone Encounter (Signed)
Following up    RN Biagio Borg  with [place] [facility] [phone] x [phone],    Called to report system unable to fax at this time.    Today wake with SOB for 3 days now, Feeling more tired than usual for 3 days, Pedal for 4 days not improving and clothing is fitting tighter than normal.    138.2wt this is stable.   RN just wanted to make you aware.

## 2013-04-23 NOTE — Telephone Encounter (Signed)
New message    C/o swelling in legs. Kidney doctor took her off calcitrioroiol? About 1 month ago.  Could this be causing her legs to swell?

## 2013-04-28 ENCOUNTER — Other Ambulatory Visit (INDEPENDENT_AMBULATORY_CARE_PROVIDER_SITE_OTHER): Payer: Self-pay | Admitting: Internal Medicine

## 2013-04-28 DIAGNOSIS — K219 Gastro-esophageal reflux disease without esophagitis: Secondary | ICD-10-CM

## 2013-04-28 MED ORDER — ALPRAZOLAM 0.5 MG PO TABS: 0.5000 mg | ORAL_TABLET | Freq: Three times a day (TID) | ORAL | Status: DC

## 2013-05-19 ENCOUNTER — Other Ambulatory Visit (INDEPENDENT_AMBULATORY_CARE_PROVIDER_SITE_OTHER): Payer: Self-pay | Admitting: *Deleted

## 2013-05-19 DIAGNOSIS — K219 Gastro-esophageal reflux disease without esophagitis: Secondary | ICD-10-CM

## 2013-05-19 MED ORDER — ALPRAZOLAM 0.5 MG PO TABS: 0.5000 mg | ORAL_TABLET | Freq: Three times a day (TID) | ORAL | Status: DC

## 2013-05-19 NOTE — Telephone Encounter (Signed)
Medication refill, last filled for #90 on 04/30/13.

## 2013-05-20 ENCOUNTER — Other Ambulatory Visit (INDEPENDENT_AMBULATORY_CARE_PROVIDER_SITE_OTHER): Payer: Self-pay | Admitting: Internal Medicine

## 2013-05-20 DIAGNOSIS — K219 Gastro-esophageal reflux disease without esophagitis: Secondary | ICD-10-CM

## 2013-05-20 MED ORDER — ALPRAZOLAM 0.5 MG PO TABS: 0.5000 mg | ORAL_TABLET | Freq: Three times a day (TID) | ORAL | Status: DC

## 2013-05-20 NOTE — Telephone Encounter (Signed)
No answer at home number.

## 2013-05-20 NOTE — Telephone Encounter (Signed)
Rx given to front desk. Patient can pick up Monday. Just received 21 from her Dr. For a broken arm.

## 2013-05-27 NOTE — Telephone Encounter (Signed)
Apt has been scheduled for 08/11/12 at 11:15 am with Dr. Karilyn Cota.

## 2013-05-28 ENCOUNTER — Telehealth (INDEPENDENT_AMBULATORY_CARE_PROVIDER_SITE_OTHER): Payer: Self-pay | Admitting: *Deleted

## 2013-05-28 ENCOUNTER — Other Ambulatory Visit (INDEPENDENT_AMBULATORY_CARE_PROVIDER_SITE_OTHER): Payer: Self-pay | Admitting: *Deleted

## 2013-05-28 ENCOUNTER — Other Ambulatory Visit (INDEPENDENT_AMBULATORY_CARE_PROVIDER_SITE_OTHER): Payer: Self-pay | Admitting: Internal Medicine

## 2013-05-28 DIAGNOSIS — R109 Unspecified abdominal pain: Secondary | ICD-10-CM

## 2013-05-28 NOTE — Telephone Encounter (Signed)
Needs the Hydrocodone refilled. Her return phone number is [phone].

## 2013-05-28 NOTE — Telephone Encounter (Signed)
A refill request was sent to Dr.Rehman. I will contact the patient's pharmacy when the prescription is ready, they will come by and pick it up for her.

## 2013-05-28 NOTE — Telephone Encounter (Signed)
Patient called and requested refill on Hydrocodone. Last filled on 04/16/13.

## 2013-05-31 MED ORDER — HYDROCODONE-ACETAMINOPHEN 5-325 MG PO TABS: 1.0000 | ORAL_TABLET | Freq: Three times a day (TID) | ORAL | Status: DC | PRN

## 2013-06-04 ENCOUNTER — Encounter (INDEPENDENT_AMBULATORY_CARE_PROVIDER_SITE_OTHER): Payer: Self-pay

## 2013-06-04 ENCOUNTER — Other Ambulatory Visit: Payer: Self-pay | Admitting: Cardiovascular Disease

## 2013-06-10 ENCOUNTER — Telehealth: Payer: Self-pay | Admitting: Cardiovascular Disease

## 2013-06-10 NOTE — Telephone Encounter (Signed)
Follow Up:  The patient is calling minutes after Melisa RN from [employer]. Pt is calling to report her 3 lb wt gain. Pt states she is going to the bathroom ok now. Pt would like call back from the nurse.

## 2013-06-10 NOTE — Telephone Encounter (Signed)
F/u made with both pt and Melissa RN. C/o abd wt gain, over night wt gain of 3 lb. No sob noted in speech, denies sob at rest just with exertion.  C/o cp off and on per Melissa rn but not pt, pt later said when she lays down she will get a full chest feeling but not currently. Pt started on her own lasix 40 mg yesterday// med is off her list due to renal insufficiency and has 1 kidney. Food intake was reviewed/// pt is eating Ham and occ canned soups, I reviewed high sodium foods/ condiments to avoid and informed her [facility] nurse. Her [employer] will call pt tomorrow to re assess and continue low sodium teaching and daily wts. Pt was told to take lasix 40 mg daily today and tomorrow am. Appointment was made with Tereso Newcomer PA-C. 06/12/13

## 2013-06-10 NOTE — Telephone Encounter (Signed)
New problem:  Melisa, RN,  states she believes the pt is experiencing symptoms of heart failure... Pt has had a 3 lb wt gain over night. Pt is also reporting her clothes are fitting tighter around the waist. Pt has been SOB for 3 days and worse at night. PT states she has had a few episodes where her heart was racing and some chest pain. Melisa states she spoke with the pt on the phone before she called [place] and at that time the patient was not having chest pain but has previously this week.   Melisa states the pt was also c/o of not being able to void or when she does it's not very much.

## 2013-06-12 ENCOUNTER — Ambulatory Visit (INDEPENDENT_AMBULATORY_CARE_PROVIDER_SITE_OTHER): Payer: Medicare Other | Admitting: Physician Assistant

## 2013-06-12 ENCOUNTER — Encounter: Payer: Self-pay | Admitting: Physician Assistant

## 2013-06-12 VITALS — BP 122/60 | HR 57 | Ht 65.0 in | Wt 134.0 lb

## 2013-06-12 DIAGNOSIS — I5033 Acute on chronic diastolic (congestive) heart failure: Secondary | ICD-10-CM

## 2013-06-12 DIAGNOSIS — I10 Essential (primary) hypertension: Secondary | ICD-10-CM

## 2013-06-12 DIAGNOSIS — R0602 Shortness of breath: Secondary | ICD-10-CM

## 2013-06-12 DIAGNOSIS — R079 Chest pain, unspecified: Secondary | ICD-10-CM

## 2013-06-12 NOTE — Progress Notes (Signed)
[address] [place], [place]  [address] Phone: [phone] Fax:  [phone]  Date:  06/12/2013   ID:  [name], DOB 01-31-1941, MRN [id number]  PCP:  Malissa Hippo, MD  Cardiologist:  Dr. Delane Ginger     History of Present Illness: [name] is a 72 y.o. female with a history of dilated cardiomyopathy and systolic CHF, chronic kidney disease (followed by nephrology), HTN, prior nephrectomy with cystectomy and ileal conduit, chronic metabolic acidosis, GERD.  Prior EF was 25-30% in 2012. This was in the setting of an admission with acute metabolic encephalopathy and E coli bacteremia.  Med Rx was recommended.  Myoview (05/2010): No ischemia, EF 62%. [name] ejection fraction has improved since 2012. Echocardiogram (10/2011): EF 55-60%, normal wall motion, mild MR, mild RAE, mild to moderate TR, PASP 33.  Last seen by Dr. Elease Hashimoto 11/2012.   She has a scale monitored through [name] insurance company. She's recently been noted to have a 3 pound weight gain and increased dyspnea with exertion. She was asked to resume [name] Lasix and followup today.  The patient tells me that she's had increased dyspnea with exertion for the last 1-1/2 weeks. She describes NYHA class III dyspnea. She has been sleeping on an incline. She has noted PND. She notes increased abdominal girth. She notes mild increased pedal edema. She has chronic exertional chest pain. This has been present for years without significant change. She denies syncope. She has been dizzy.  She has noted approximately a 6 pound weight gain at home which is consistent with the weight gain we note on our scales here.   Recent Labs: 01/16/2013: ALT 12; Hemoglobin 10.5*; TSH 0.795  01/18/2013: Creatinine 1.40*; Potassium 4.1   Wt Readings from Last 3 Encounters:  06/12/13 134 lb (60.782 kg)  01/19/13 128 lb 1.4 oz (58.1 kg)  12/22/12 127 lb 12.8 oz (57.97 kg)     Past Medical History  Diagnosis Date  . Hypertension   .  Chronic kidney disease   . CHF (congestive heart failure)   . Gastroesophageal reflux   . Chronic UTI   . Hydronephrosis     She has chronic mild left   . AV fistula     clotted in [name] left forearm  . Cellulitis   . Pneumothorax     secondary to central line placement  . Single kidney     post right nephrectomy  . Myocardial infarction 2011; 2012  . Numbness and tingling of both legs   . Anginal pain   . Heart murmur   . Dysrhythmia     "heart beats real fast"  . Chronic anemia   . Pneumonia     "one time"  . Chronic chest pain 01/14/12    1-2 X/day/H&P  . Arthritis     "at different places"  . Anxiety   . Depression   . Headache(784.0)     "alot; not daily"  . Frequent falls   . Dementia   . Collagen vascular disease   . Renal insufficiency   . Iron deficiency anemia 11/10/2012  . S/P ileal conduit     post cystectomy/ileal conduit    Current Outpatient Prescriptions  Medication Sig Dispense Refill  . alendronate (FOSAMAX) 70 MG tablet Take 70 mg by mouth every 7 (seven) days. Take with a full glass of water on an empty stomach. Takes on Sunday.      . ALPRAZolam (XANAX) 0.5 MG tablet Take  1 tablet (0.5 mg total) by mouth 3 (three) times daily.  90 tablet  2  . AMLODIPINE BESYLATE PO Take 5 mg by mouth daily.      Marland Kitchen aspirin 81 MG tablet Take 81 mg by mouth daily.        . calcitRIOL (ROCALTROL) 0.5 MCG capsule daily      . calcium citrate-vitamin D 200-200 MG-UNIT TABS Take 1 tablet by mouth daily.        . carvedilol (COREG) 25 MG tablet TAKE (1) TABLET TWICE DAILY.  60 tablet  3  . doxepin (SINEQUAN) 50 MG capsule TAKE 1 CAPSULE BY MOUTH AT BEDTIME.  30 capsule  2  . ferrous gluconate (FERGON) 325 MG tablet Take 325 mg by mouth daily with breakfast.        . fish oil-omega-3 fatty acids 1000 MG capsule Take 1 g by mouth 4 (four) times daily.       . furosemide (LASIX) 40 MG tablet Take 40 mg by mouth daily.      . hydrALAZINE (APRESOLINE) 10 MG tablet TAKE 1  TABLET BY MOUTH 3 TIMES DAILY.  90 tablet  0  . HYDROcodone-acetaminophen (NORCO/VICODIN) 5-325 MG per tablet Take 1 tablet by mouth every 8 (eight) hours as needed.  60 tablet  0  . Multiple Vitamin (MULTIVITAMIN) tablet Take 1 tablet by mouth daily.        . nitroGLYCERIN (NITROSTAT) 0.4 MG SL tablet Place 1 tablet (0.4 mg total) under the tongue every 5 (five) minutes as needed. For chest pain  25 tablet  5  . omeprazole (PRILOSEC) 20 MG capsule Take 1 capsule (20 mg total) by mouth daily.  30 capsule  5  . pyridOXINE (VITAMIN B-6) 100 MG tablet Take 100 mg by mouth daily.      . sodium bicarbonate 650 MG tablet Take 1,300 mg by mouth 2 (two) times daily.       . vitamin C (ASCORBIC ACID) 500 MG tablet Take 500 mg by mouth daily.       No current facility-administered medications for this visit.    Allergies:   Ketorolac tromethamine   Social History:  The patient  reports that she quit smoking about 8 years ago. [name] smoking use included Cigarettes. She smoked 0.00 packs per day for 2 years. She has never used smokeless tobacco. She reports that she does not drink alcohol or use illicit drugs.   Family History:  The patient's family history includes Healthy in [name] daughter, daughter, and son; Heart attack in [name] mother.   ROS:  Please see the history of present illness.   She has a nonproductive cough. She denies fevers. She denies vomiting, diarrhea, melena, hematochezia. She recently fell and broke [name] right wrist. She is status post ORIF.   All other systems reviewed and negative.   PHYSICAL EXAM: VS:  BP 122/60  Pulse 57  Ht 5\' 5"  (1.651 m)  Wt 134 lb (60.782 kg)  BMI 22.30 kg/m2 Well nourished, well developed, in no acute distress HEENT: normal Neck: no JVD Cardiac:  normal S1, S2; RRR; no murmur Lungs:  clear to auscultation bilaterally, no wheezing, rhonchi or rales Abd: soft, nontender, no hepatomegaly Ext: trace bilateral LE edema Skin: warm and dry Neuro:  CNs 2-12  intact, no focal abnormalities noted  EKG:  Sinus bradycardia, HR 57, normal axis, nonspecific ST-T wave changes, no change from prior tracing     ASSESSMENT AND PLAN:  1. Acute on  Chronic Diastolic CHF:  She has symptoms consistent with volume overload. [name] weight is up 6 pounds since last seen. However, [name] exam findings are fairly benign. She does have chronic kidney disease which is likely exacerbating [name] mild volume overload in the setting of a high salt diet. She resumed Lasix within the last 24 hours. I have asked [name] to continue this for now. We will check a basic metabolic panel, CBC, BNP today. She sees Dr. Elease Hashimoto next week. She will have a follow up basic metabolic panel at that time. I have asked [name] to continue to weigh herself daily. She does admit to a diet high in salt. We will give [name] information for a low sodium diet. 2. Chronic Kidney Disease: Monitor renal function closely with adjustments in diuretics. 3. Hypertension:  Controlled.   4. Chest Pain:  This is chronic.  If symptoms do not improve with diuresis, consider arranging stress test. 5. Disposition:  F/u with Dr. Delane Ginger 12/23 as planned.   Signed, Tereso Newcomer, PA-C  06/12/2013 10:35 AM

## 2013-06-12 NOTE — Patient Instructions (Signed)
Your physician recommends that you continue on your current medications as directed. Please refer to the Current Medication list given to you today.   LAB WORK TODAY; BMET, CBC W/DIFF, BNP  REPEAT BMET 12/23  MAKE SURE TO KEEP YOUR FOLLOW UP WITH DR. Elease Hashimoto 06/16/13  2 Gram Low Sodium Diet A 2 gram sodium diet restricts the amount of sodium in the diet to no more than 2 g or 2000 mg daily. Limiting the amount of sodium is often used to help lower blood pressure. It is important if you have heart, liver, or kidney problems. Many foods contain sodium for flavor and sometimes as a preservative. When the amount of sodium in a diet needs to be low, it is important to know what to look for when choosing foods and drinks. The following includes some information and guidelines to help make it easier for you to adapt to a low sodium diet. QUICK TIPS  Do not add salt to food.  Avoid convenience items and fast food.  Choose unsalted snack foods.  Buy lower sodium products, often labeled as "lower sodium" or "no salt added."  Check food labels to learn how much sodium is in 1 serving.  When eating at a restaurant, ask that your food be prepared with less salt or none, if possible. READING FOOD LABELS FOR SODIUM INFORMATION The nutrition facts label is a good place to find how much sodium is in foods. Look for products with no more than 500 to 600 mg of sodium per meal and no more than 150 mg per serving. Remember that 2 g = 2000 mg. The food label may also list foods as:  Sodium-free: Less than 5 mg in a serving.  Very low sodium: 35 mg or less in a serving.  Low-sodium: 140 mg or less in a serving.  Light in sodium: 50% less sodium in a serving. For example, if a food that usually has 300 mg of sodium is changed to become light in sodium, it will have 150 mg of sodium.  Reduced sodium: 25% less sodium in a serving. For example, if a food that usually has 400 mg of sodium is changed to  reduced sodium, it will have 300 mg of sodium. CHOOSING FOODS Grains  Avoid: Salted crackers and snack items. Some cereals, including instant hot cereals. Bread stuffing and biscuit mixes. Seasoned rice or pasta mixes.  Choose: Unsalted snack items. Low-sodium cereals, oats, puffed wheat and rice, shredded wheat. English muffins and bread. Pasta. Meats  Avoid: Salted, canned, smoked, spiced, pickled meats, including fish and poultry. Bacon, ham, sausage, cold cuts, hot dogs, anchovies.  Choose: Low-sodium canned tuna and salmon. Fresh or frozen meat, poultry, and fish. Dairy  Avoid: Processed cheese and spreads. Cottage cheese. Buttermilk and condensed milk. Regular cheese.  Choose: Milk. Low-sodium cottage cheese. Yogurt. Sour cream. Low-sodium cheese. Fruits and Vegetables  Avoid: Regular canned vegetables. Regular canned tomato sauce and paste. Frozen vegetables in sauces. Olives. Rosita Fire. Relishes. Sauerkraut.  Choose: Low-sodium canned vegetables. Low-sodium tomato sauce and paste. Frozen or fresh vegetables. Fresh and frozen fruit. Condiments  Avoid: Canned and packaged gravies. Worcestershire sauce. Tartar sauce. Barbecue sauce. Soy sauce. Steak sauce. Ketchup. Onion, garlic, and table salt. Meat flavorings and tenderizers.  Choose: Fresh and dried herbs and spices. Low-sodium varieties of mustard and ketchup. Lemon juice. Tabasco sauce. Horseradish. SAMPLE 2 GRAM SODIUM MEAL PLAN Breakfast / Sodium (mg)  1 cup low-fat milk / 143 mg  2 slices whole-wheat toast /  270 mg  1 tbs heart-healthy margarine / 153 mg  1 hard-boiled egg / 139 mg  1 small orange / 0 mg Lunch / Sodium (mg)  1 cup raw carrots / 76 mg   cup hummus / 298 mg  1 cup low-fat milk / 143 mg   cup red grapes / 2 mg  1 whole-wheat pita bread / 356 mg Dinner / Sodium (mg)  1 cup whole-wheat pasta / 2 mg  1 cup low-sodium tomato sauce / 73 mg  3 oz lean ground beef / 57 mg  1 small side  salad (1 cup raw spinach leaves,  cup cucumber,  cup yellow bell pepper) with 1 tsp olive oil and 1 tsp red wine vinegar / 25 mg Snack / Sodium (mg)  1 container low-fat vanilla yogurt / 107 mg  3 graham cracker squares / 127 mg Nutrient Analysis  Calories: 2033  Protein: 77 g  Carbohydrate: 282 g  Fat: 72 g  Sodium: 1971 mg Document Released: 06/11/2005 Document Revised: 09/03/2011 Document Reviewed: 09/12/2009 [employer] Patient Information 2014 [place], [place].

## 2013-06-15 ENCOUNTER — Encounter: Payer: Self-pay | Admitting: Cardiovascular Disease

## 2013-06-16 ENCOUNTER — Ambulatory Visit (INDEPENDENT_AMBULATORY_CARE_PROVIDER_SITE_OTHER): Payer: Medicare Other | Admitting: Cardiovascular Disease

## 2013-06-16 ENCOUNTER — Encounter: Payer: Self-pay | Admitting: Cardiovascular Disease

## 2013-06-16 VITALS — BP 131/67 | HR 66 | Ht 65.0 in | Wt 130.0 lb

## 2013-06-16 DIAGNOSIS — I5022 Chronic systolic (congestive) heart failure: Secondary | ICD-10-CM

## 2013-06-16 DIAGNOSIS — I10 Essential (primary) hypertension: Secondary | ICD-10-CM

## 2013-06-16 DIAGNOSIS — I509 Heart failure, unspecified: Secondary | ICD-10-CM

## 2013-06-16 MED ORDER — FUROSEMIDE 40 MG PO TABS: 40.0000 mg | ORAL_TABLET | Freq: Every day | ORAL | Status: DC

## 2013-06-16 NOTE — Progress Notes (Signed)
[name] Date of Birth  1941-03-21 [facility] [address]    [address] [place], [place]  [address] [phone]  Fax  [phone]  History of Present Illness:  72 year old female with a history of left ventricular systolic heart failure after hip fracture.  She complains of some intermittent episodes of chest pain. She states that it feels like soreness  in the chest. It lasts for several minutes. It causes her some diaphoresis.  She has a history of congestive heart failure. Her ejection fraction in December 2001 was normal. Her most recent ejection fraction is now 25%.  Has a history of chronic renal insufficiency. When I saw her in November, 2011, she had acute renal insufficiency with a creatinine of 5.5. She also had mildly positive cardiac enzymes thought to be to her underlying medical illnesses.  She has a history of chronic renal insufficiency. She's followed by the nephrologists Marina Gravel, MD). She has had several episodes of acute renal failure and several attempts have been made to place an AV fistula in her. Had not been able to place an AV fistula because of her poor veins.   She continues to have some intermittent episodes of chest pain. These occur particularly when she bends over to make the bed.  She does not get any regular exercise. She does bring in the groceries on occasion she does not typically have any chest pain with activity.  Her bedroom is on the first floor of her house so she does not climb stairs.    Is fairly unsteady on her feet. She falls fairly frequently against the walls. She has a large bruise on her left arm.  She complains of being profoundly weak. She doesn't have much chest pain and it does not seem to limit her daily activities.  She appears to be generally stronger today than she was the last time I saw her.  December 22, 2012:  [name] is doing about the same.  She feels weak on occasion.   She has occasional CP.  Typically in  the left side of her chest ( she had to ask her husband about where her pains were) , no dyspnea.  She seems to have some memory issues.  Needs assurance from her husband for most of her answers.    Dec. 23, 2014:  She is doing well from a cardiac standpoint.  She broke her right wrist.   Was changing her bed and mattress knocked her down.      Current Outpatient Prescriptions on File Prior to Visit  Medication Sig Dispense Refill  . alendronate (FOSAMAX) 70 MG tablet Take 70 mg by mouth every 7 (seven) days. Take with a full glass of water on an empty stomach. Takes on Sunday.      . ALPRAZolam (XANAX) 0.5 MG tablet Take 1 tablet (0.5 mg total) by mouth 3 (three) times daily.  90 tablet  2  . AMLODIPINE BESYLATE PO Take 5 mg by mouth daily.      Marland Kitchen aspirin 81 MG tablet Take 81 mg by mouth daily.        . calcitRIOL (ROCALTROL) 0.5 MCG capsule daily      . calcium citrate-vitamin D 200-200 MG-UNIT TABS Take 1 tablet by mouth daily.        . carvedilol (COREG) 25 MG tablet TAKE (1) TABLET TWICE DAILY.  60 tablet  3  . doxepin (SINEQUAN) 50 MG capsule TAKE 1 CAPSULE BY MOUTH AT BEDTIME.  30 capsule  2  . ferrous gluconate (FERGON) 325 MG tablet Take 325 mg by mouth daily with breakfast.        . fish oil-omega-3 fatty acids 1000 MG capsule Take 1 g by mouth 4 (four) times daily.       . furosemide (LASIX) 40 MG tablet Take 40 mg by mouth daily.      . hydrALAZINE (APRESOLINE) 10 MG tablet TAKE 1 TABLET BY MOUTH 3 TIMES DAILY.  90 tablet  0  . HYDROcodone-acetaminophen (NORCO/VICODIN) 5-325 MG per tablet Take 1 tablet by mouth every 8 (eight) hours as needed.  60 tablet  0  . Multiple Vitamin (MULTIVITAMIN) tablet Take 1 tablet by mouth daily.        . nitroGLYCERIN (NITROSTAT) 0.4 MG SL tablet Place 1 tablet (0.4 mg total) under the tongue every 5 (five) minutes as needed. For chest pain  25 tablet  5  . omeprazole (PRILOSEC) 20 MG capsule Take 1 capsule (20 mg total) by mouth daily.  30  capsule  5  . pyridOXINE (VITAMIN B-6) 100 MG tablet Take 100 mg by mouth daily.      . sodium bicarbonate 650 MG tablet Take 1,300 mg by mouth 2 (two) times daily.       . vitamin C (ASCORBIC ACID) 500 MG tablet Take 500 mg by mouth daily.       No current facility-administered medications on file prior to visit.  she is not taking the Atenolol  Allergies  Allergen Reactions  . Ketorolac Tromethamine Rash    Past Medical History  Diagnosis Date  . Chronic kidney disease   . Gastroesophageal reflux   . Chronic UTI   . Hydronephrosis     She has chronic mild left   . AV fistula     clotted in her left forearm  . Cellulitis   . Pneumothorax     secondary to central line placement  . Single kidney     post right nephrectomy  . Myocardial infarction 2011; 2012  . Numbness and tingling of both legs   . Anginal pain   . Heart murmur   . Dysrhythmia     "heart beats real fast"  . Chronic anemia   . Pneumonia     "one time"  . Chronic chest pain 01/14/12    1-2 X/day/H&P  . Arthritis     "at different places"  . Anxiety   . Depression   . Headache(784.0)     "alot; not daily"  . Frequent falls   . Dementia   . Collagen vascular disease   . Renal insufficiency   . S/P ileal conduit     post cystectomy/ileal conduit  . Anemia     Received IV Feraheme (x) 2 at [facility]  . Iron deficiency     Received IV Feraheme (x) 2 at [facility]  . Dysarthria 03/31/13    Speech is almost a little bit dysarthritic  . Unsteady     Stronger since she came out of nursing home & is walking with a cane  . Edema 03/31/13    Gained 9 pounds overnight & telephone nurse instructed her to take 20mg  of Lasix bid on 03/30/13. Still has swelling in legs.  . Metabolic acidosis     Chronic  . Hypertension     Good control  . CHF (congestive heart failure)     Systolic heart failure EF 25-30% by ECHO 2012  . Altered  mental state     Recurrent episodes, with confusion variously related to  narcotic use, infection, etc. and has required hospitilization, nursing home stays for rehab related to weakness, encephalopathy, etc.  . History of sepsis 2012    With E.Coli bacteremia  . E coli bacteremia 2012    History of sepsis & E.Coli bacteremia  . Secondary hyperparathyroidism     Dr. Caryn Section had a sestamibi scan of her neck in May 2014 which was negative for parathyroid activity in her neck.    Past Surgical History  Procedure Laterality Date  . Hip arthroplasty      left; S/P fracture  . Rod left leg  2012    left hip "to almost my knee; for my hip"  . Creation / revision of ileostomy / jejunostomy  1973  . Nephrectomy  1973    right  . Fracture surgery    . Partial colectomy  1970  . Tonsillectomy and adenoidectomy      "when I was a kid"  . Cholecystectomy  2000  . Appendectomy  2000  . Abdominal hysterectomy    . Dilation and curettage of uterus    . Cesarean section  1969    only 4th of 4 children was c-section  . Av fistula placement  1970's    LFA  . Bladder removal  1974    with ileal conduit and urostomy    History  Smoking status  . Former Smoker -- 2 years  . Types: Cigarettes  . Quit date: 06/25/2004  Smokeless tobacco  . Never Used    Comment: 01/14/12 "just smoked one cigarette, once in awhile for ~ 2 years"    History  Alcohol Use No    Family History  Problem Relation Age of Onset  . Heart attack Mother   . Healthy Daughter   . Healthy Daughter   . Healthy Son     Reviw of Systems:  Reviewed in the HPI.  All other systems are negative.  Physical Exam: BP 131/67  Pulse 66  Ht 5\' 5"  (1.651 m)  Wt 130 lb (58.968 kg)  BMI 21.63 kg/m2 The patient is alert and oriented x 3.  The mood and affect are normal.   Skin: warm and dry.  She is very weak.  Was very unsteady - used a cane to get up to the exam table   HEENT:   Normal carotids, no JVD  Lungs: clear   Heart: RR, no murmurs    Abdomen: soft, good BS  Extremities:  No leg  edema  Neuro:  Her strength has generally improved. When I saw her 6 months ago she was so weak that she could not get up on the exam table. Today she got up and down off the exam table without any significant difficulty. Her speech pattern is fairly clear. Previously, her speech was very slow  ECG: 06/16/2013: Normal sinus rhythm at 64. She has nonspecific ST and T wave abnormalities.   Assessment / Plan:

## 2013-06-16 NOTE — Patient Instructions (Signed)
Your physician wants you to follow-up in: 6 months  You will receive a reminder letter in the mail two months in advance. If you don't receive a letter, please call our office to schedule the follow-up appointment.  Your physician recommends that you continue on your current medications as directed. Please refer to the Current Medication list given to you today.  

## 2013-06-16 NOTE — Assessment & Plan Note (Signed)
That he has done very well. Her last echo showed normal left ventricular systolic function. We will continue with her same medications. I've encouraged her to  exercise on a regular basis. She has a stationary bike that she uses occasionally.  She's not having episodes of chest pain or syncope.  See her again in 6 months.

## 2013-06-22 ENCOUNTER — Telehealth (INDEPENDENT_AMBULATORY_CARE_PROVIDER_SITE_OTHER): Payer: Self-pay | Admitting: *Deleted

## 2013-06-22 NOTE — Telephone Encounter (Signed)
rec'd letter that doxepin was going up in price, is there a generic she can switch to

## 2013-06-23 NOTE — Telephone Encounter (Signed)
Our office has not rec'd anything from the patient's insurance company. I called [facility] and have spoken with Herbert Seta, she states that Doxepin is a generic but she is going to talk with Mellody Dance , then call me back with any further recommendations.

## 2013-07-03 ENCOUNTER — Other Ambulatory Visit (INDEPENDENT_AMBULATORY_CARE_PROVIDER_SITE_OTHER): Payer: Self-pay | Admitting: *Deleted

## 2013-07-03 DIAGNOSIS — R109 Unspecified abdominal pain: Secondary | ICD-10-CM

## 2013-07-03 NOTE — Telephone Encounter (Signed)
Rec'd a refill request for Hydrocodone/Apap 5/325. Last written on 05/28/13.

## 2013-07-06 MED ORDER — HYDROCODONE-ACETAMINOPHEN 5-325 MG PO TABS: 1.0000 | ORAL_TABLET | Freq: Three times a day (TID) | ORAL | Status: DC | PRN

## 2013-07-26 ENCOUNTER — Emergency Department (HOSPITAL_COMMUNITY): Payer: Medicare Other

## 2013-07-26 ENCOUNTER — Encounter (HOSPITAL_COMMUNITY): Payer: Self-pay | Admitting: Emergency Medicine

## 2013-07-26 ENCOUNTER — Inpatient Hospital Stay (HOSPITAL_COMMUNITY)
Admission: EM | Admit: 2013-07-26 | Discharge: 2013-07-29 | DRG: 689 | Disposition: A | Discharge status: Home / Self Care | Payer: Medicare Other | Attending: Internal Medicine | Admitting: Internal Medicine

## 2013-07-26 DIAGNOSIS — N183 Chronic kidney disease, stage 3 unspecified: Secondary | ICD-10-CM | POA: Diagnosis present

## 2013-07-26 DIAGNOSIS — R509 Fever, unspecified: Secondary | ICD-10-CM

## 2013-07-26 DIAGNOSIS — D509 Iron deficiency anemia, unspecified: Secondary | ICD-10-CM

## 2013-07-26 DIAGNOSIS — F419 Anxiety disorder, unspecified: Secondary | ICD-10-CM

## 2013-07-26 DIAGNOSIS — Z79899 Other long term (current) drug therapy: Secondary | ICD-10-CM

## 2013-07-26 DIAGNOSIS — M546 Pain in thoracic spine: Secondary | ICD-10-CM

## 2013-07-26 DIAGNOSIS — D72829 Elevated white blood cell count, unspecified: Secondary | ICD-10-CM | POA: Diagnosis present

## 2013-07-26 DIAGNOSIS — G92 Toxic encephalopathy: Secondary | ICD-10-CM | POA: Diagnosis present

## 2013-07-26 DIAGNOSIS — E872 Acidosis, unspecified: Secondary | ICD-10-CM | POA: Diagnosis present

## 2013-07-26 DIAGNOSIS — I252 Old myocardial infarction: Secondary | ICD-10-CM

## 2013-07-26 DIAGNOSIS — Z96649 Presence of unspecified artificial hip joint: Secondary | ICD-10-CM

## 2013-07-26 DIAGNOSIS — G929 Unspecified toxic encephalopathy: Secondary | ICD-10-CM | POA: Diagnosis present

## 2013-07-26 DIAGNOSIS — F329 Major depressive disorder, single episode, unspecified: Secondary | ICD-10-CM

## 2013-07-26 DIAGNOSIS — I509 Heart failure, unspecified: Secondary | ICD-10-CM | POA: Diagnosis present

## 2013-07-26 DIAGNOSIS — E860 Dehydration: Secondary | ICD-10-CM | POA: Diagnosis present

## 2013-07-26 DIAGNOSIS — T424X5A Adverse effect of benzodiazepines, initial encounter: Secondary | ICD-10-CM | POA: Diagnosis present

## 2013-07-26 DIAGNOSIS — F411 Generalized anxiety disorder: Secondary | ICD-10-CM | POA: Diagnosis present

## 2013-07-26 DIAGNOSIS — N189 Chronic kidney disease, unspecified: Secondary | ICD-10-CM

## 2013-07-26 DIAGNOSIS — Z87440 Personal history of urinary (tract) infections: Secondary | ICD-10-CM

## 2013-07-26 DIAGNOSIS — E876 Hypokalemia: Secondary | ICD-10-CM | POA: Diagnosis present

## 2013-07-26 DIAGNOSIS — Z9181 History of falling: Secondary | ICD-10-CM

## 2013-07-26 DIAGNOSIS — K219 Gastro-esophageal reflux disease without esophagitis: Secondary | ICD-10-CM | POA: Diagnosis present

## 2013-07-26 DIAGNOSIS — N390 Urinary tract infection, site not specified: Principal | ICD-10-CM | POA: Diagnosis present

## 2013-07-26 DIAGNOSIS — F32A Depression, unspecified: Secondary | ICD-10-CM

## 2013-07-26 DIAGNOSIS — I129 Hypertensive chronic kidney disease with stage 1 through stage 4 chronic kidney disease, or unspecified chronic kidney disease: Secondary | ICD-10-CM | POA: Diagnosis present

## 2013-07-26 DIAGNOSIS — Z9049 Acquired absence of other specified parts of digestive tract: Secondary | ICD-10-CM

## 2013-07-26 DIAGNOSIS — N289 Disorder of kidney and ureter, unspecified: Secondary | ICD-10-CM | POA: Diagnosis present

## 2013-07-26 DIAGNOSIS — M545 Low back pain, unspecified: Secondary | ICD-10-CM | POA: Diagnosis present

## 2013-07-26 DIAGNOSIS — Z8249 Family history of ischemic heart disease and other diseases of the circulatory system: Secondary | ICD-10-CM

## 2013-07-26 DIAGNOSIS — G8929 Other chronic pain: Secondary | ICD-10-CM | POA: Diagnosis present

## 2013-07-26 DIAGNOSIS — D649 Anemia, unspecified: Secondary | ICD-10-CM | POA: Diagnosis present

## 2013-07-26 DIAGNOSIS — F0390 Unspecified dementia without behavioral disturbance: Secondary | ICD-10-CM | POA: Diagnosis present

## 2013-07-26 DIAGNOSIS — E871 Hypo-osmolality and hyponatremia: Secondary | ICD-10-CM

## 2013-07-26 DIAGNOSIS — R531 Weakness: Secondary | ICD-10-CM

## 2013-07-26 DIAGNOSIS — Z905 Acquired absence of kidney: Secondary | ICD-10-CM

## 2013-07-26 DIAGNOSIS — N179 Acute kidney failure, unspecified: Secondary | ICD-10-CM | POA: Diagnosis present

## 2013-07-26 DIAGNOSIS — I5022 Chronic systolic (congestive) heart failure: Secondary | ICD-10-CM | POA: Diagnosis present

## 2013-07-26 DIAGNOSIS — Y92009 Unspecified place in unspecified non-institutional (private) residence as the place of occurrence of the external cause: Secondary | ICD-10-CM

## 2013-07-26 DIAGNOSIS — Z87891 Personal history of nicotine dependence: Secondary | ICD-10-CM

## 2013-07-26 DIAGNOSIS — W19XXXA Unspecified fall, initial encounter: Secondary | ICD-10-CM

## 2013-07-26 DIAGNOSIS — I10 Essential (primary) hypertension: Secondary | ICD-10-CM

## 2013-07-26 DIAGNOSIS — G4700 Insomnia, unspecified: Secondary | ICD-10-CM

## 2013-07-26 DIAGNOSIS — R1032 Left lower quadrant pain: Secondary | ICD-10-CM

## 2013-07-26 DIAGNOSIS — Z7982 Long term (current) use of aspirin: Secondary | ICD-10-CM

## 2013-07-26 DIAGNOSIS — M129 Arthropathy, unspecified: Secondary | ICD-10-CM | POA: Diagnosis present

## 2013-07-26 LAB — CBC WITH DIFFERENTIAL/PLATELET
BASOS ABS: 0 10*3/uL (ref 0.0–0.1)
BASOS PCT: 0 % (ref 0–1)
EOS ABS: 0 10*3/uL (ref 0.0–0.7)
EOS PCT: 0 % (ref 0–5)
HEMATOCRIT: 33.4 % — AB (ref 36.0–46.0)
Hemoglobin: 11.8 g/dL — ABNORMAL LOW (ref 12.0–15.0)
Lymphocytes Relative: 8 % — ABNORMAL LOW (ref 12–46)
Lymphs Abs: 1.8 10*3/uL (ref 0.7–4.0)
MCH: 33.9 pg (ref 26.0–34.0)
MCHC: 35.3 g/dL (ref 30.0–36.0)
MCV: 96 fL (ref 78.0–100.0)
MONO ABS: 1.7 10*3/uL — AB (ref 0.1–1.0)
Monocytes Relative: 8 % (ref 3–12)
NEUTROS ABS: 18.7 10*3/uL — AB (ref 1.7–7.7)
Neutrophils Relative %: 84 % — ABNORMAL HIGH (ref 43–77)
Platelets: 232 10*3/uL (ref 150–400)
RBC: 3.48 MIL/uL — ABNORMAL LOW (ref 3.87–5.11)
RDW: 16.2 % — AB (ref 11.5–15.5)
WBC: 22.2 10*3/uL — ABNORMAL HIGH (ref 4.0–10.5)

## 2013-07-26 LAB — URINALYSIS, ROUTINE W REFLEX MICROSCOPIC
Bilirubin Urine: NEGATIVE
Glucose, UA: NEGATIVE mg/dL
Ketones, ur: NEGATIVE mg/dL
LEUKOCYTES UA: NEGATIVE
NITRITE: POSITIVE — AB
PH: 6 (ref 5.0–8.0)
PROTEIN: NEGATIVE mg/dL
Specific Gravity, Urine: 1.01 (ref 1.005–1.030)
Urobilinogen, UA: 0.2 mg/dL (ref 0.0–1.0)

## 2013-07-26 LAB — ETHANOL: Alcohol, Ethyl (B): <11 mg/dL (ref 0–11)

## 2013-07-26 LAB — RAPID URINE DRUG SCREEN, HOSP PERFORMED
AMPHETAMINES: NOT DETECTED
BENZODIAZEPINES: POSITIVE — AB
Barbiturates: NOT DETECTED
Cocaine: NOT DETECTED
OPIATES: POSITIVE — AB
Tetrahydrocannabinol: NOT DETECTED

## 2013-07-26 LAB — COMPREHENSIVE METABOLIC PANEL
ALBUMIN: 2.7 g/dL — AB (ref 3.5–5.2)
ALT: 10 U/L (ref 0–35)
AST: 12 U/L (ref 0–37)
Alkaline Phosphatase: 62 U/L (ref 39–117)
BUN: 83 mg/dL — ABNORMAL HIGH (ref 6–23)
CALCIUM: 10.2 mg/dL (ref 8.4–10.5)
CO2: 20 mEq/L (ref 19–32)
CREATININE: 2.69 mg/dL — AB (ref 0.50–1.10)
Chloride: 96 mEq/L (ref 96–112)
GFR calc Af Amer: 19 mL/min — ABNORMAL LOW (ref >90)
GFR calc non Af Amer: 16 mL/min — ABNORMAL LOW (ref >90)
Glucose, Bld: 116 mg/dL — ABNORMAL HIGH (ref 70–99)
Potassium: 2.8 mEq/L — CL (ref 3.7–5.3)
Sodium: 133 mEq/L — ABNORMAL LOW (ref 137–147)
TOTAL PROTEIN: 6.9 g/dL (ref 6.0–8.3)
Total Bilirubin: 0.3 mg/dL (ref 0.3–1.2)

## 2013-07-26 LAB — ACETAMINOPHEN LEVEL: Acetaminophen (Tylenol), Serum: <15 ug/mL (ref 10–30)

## 2013-07-26 LAB — TROPONIN I: Troponin I: <0.3 ng/mL (ref <0.30)

## 2013-07-26 LAB — LACTIC ACID, PLASMA: Lactic Acid, Venous: 0.5 mmol/L (ref 0.5–2.2)

## 2013-07-26 LAB — CK: CK TOTAL: 41 U/L (ref 7–177)

## 2013-07-26 LAB — URINE MICROSCOPIC-ADD ON

## 2013-07-26 LAB — SALICYLATE LEVEL

## 2013-07-26 MED ORDER — SODIUM CHLORIDE 0.9 % IV BOLUS (SEPSIS)
500.0000 mL | Freq: Once | INTRAVENOUS | Status: AC
  Administered 2013-07-27: 500 mL via INTRAVENOUS

## 2013-07-26 MED ORDER — SODIUM CHLORIDE 0.9 % IV SOLN: INTRAVENOUS | Status: DC

## 2013-07-26 MED ORDER — POTASSIUM CHLORIDE CRYS ER 20 MEQ PO TBCR
40.0000 meq | EXTENDED_RELEASE_TABLET | Freq: Once | ORAL | Status: AC
  Administered 2013-07-27: 40 meq via ORAL
  Filled 2013-07-26: qty 2

## 2013-07-26 NOTE — ED Notes (Signed)
Doxipen 50 mg capsules filled on 1/29  - if on taken on 29th, 30th and 31st  Then daughter states 7 are missing from the bottle

## 2013-07-26 NOTE — ED Notes (Signed)
Patient has an ileourostomy - pouch is full of urine.  Discussed with Dr Lynelle DoctorKnapp.  Will send specimen of urine to lab for routine urine and drug screen.  Cannot obtain a sterile sample.  Daughter reports patient had her sleeping pills fill on 1/28 and too many are missing from the bottle for her to have taken as directed

## 2013-07-26 NOTE — ED Notes (Signed)
CRITICAL VALUE ALERT  Critical value received: K+ 2.8  Date of notification:  07/26/2013  Time of notification:  1920  Critical value read back: Yes  Nurse who received alert: Tiana LoftJanette Dagon Budai, RN  MD notified : Dr. Lynelle DoctorKnapp informed

## 2013-07-26 NOTE — ED Notes (Addendum)
Lives with family. [address]. Family states pt has been lying on couch all day with progression in confusion. Pt alert/oriented to little. Pt slightly restless and pulling on covers.denies pain. Drowsiness noted

## 2013-07-26 NOTE — ED Provider Notes (Signed)
CSN: [id number]     Arrival date & time 07/26/13  1832 History  This chart was scribed for Ward Givens, MD by Blanchard Kelch, ED Scribe. The patient was seen in room APA02/APA02. Patient's care was started at 6:39 PM.    Chief Complaint  Patient presents with  . Altered Mental Status    Patient is a 73 y.o. female presenting with altered mental status. The history is provided by the patient. No language interpreter was used.  Altered Mental Status  Level 5 caveat due to altered mental status.   HPI Comments: [name] is a 73 y.o. female with a history of dementia who presents to the Emergency Department due to AMS. Patient believes she is at [facility] in [place]. She states that the year is 30 and when asked questions she answers inappropriately such as talking about going fishing. She also goes to sleep during conversation. Per nurse via EMS, this confusion and disorientation is above baseline.   PCP  Dr Karilyn Cota  Past Medical History  Diagnosis Date  . Chronic kidney disease   . Gastroesophageal reflux   . Chronic UTI   . Hydronephrosis     She has chronic mild left   . AV fistula     clotted in her left forearm  . Cellulitis   . Pneumothorax     secondary to central line placement  . Single kidney     post right nephrectomy  . Myocardial infarction 2011; 2012  . Numbness and tingling of both legs   . Anginal pain   . Heart murmur   . Dysrhythmia     "heart beats real fast"  . Chronic anemia   . Pneumonia     "one time"  . Chronic chest pain 01/14/12    1-2 X/day/H&P  . Arthritis     "at different places"  . Anxiety   . Depression   . Headache(784.0)     "alot; not daily"  . Frequent falls   . Dementia   . Collagen vascular disease   . Renal insufficiency   . S/P ileal conduit     post cystectomy/ileal conduit  . Anemia     Received IV Feraheme (x) 2 at [facility]  . Iron deficiency     Received IV Feraheme (x) 2 at [facility].  . Dysarthria  03/31/13    Speech is almost a little bit dysarthritic  . Unsteady     Stronger since she came out of nursing home & is walking with a cane  . Edema 03/31/13    Gained 9 pounds overnight & telephone nurse instructed her to take 20mg  of Lasix bid on 03/30/13. Still has swelling in legs.  . Metabolic acidosis     Chronic  . Hypertension     Good control  . CHF (congestive heart failure)     Systolic heart failure EF 25-30% by ECHO 2012  . Altered mental state     Recurrent episodes, with confusion variously related to narcotic use, infection, etc. and has required hospitilization, nursing home stays for rehab related to weakness, encephalopathy, etc.  . History of sepsis 2012    With E.Coli bacteremia  . E coli bacteremia 2012    History of sepsis & E.Coli bacteremia  . Secondary hyperparathyroidism     Dr. Caryn Section had a sestamibi scan of her neck in May 2014 which was negative for parathyroid activity in her neck.   Past Surgical History  Procedure Laterality Date  . Hip arthroplasty      left; S/P fracture  . Rod left leg  2012    left hip "to almost my knee; for my hip"  . Creation / revision of ileostomy / jejunostomy  1973  . Nephrectomy  1973    right  . Fracture surgery    . Partial colectomy  1970  . Tonsillectomy and adenoidectomy      "when I was a kid"  . Cholecystectomy  2000  . Appendectomy  2000  . Abdominal hysterectomy    . Dilation and curettage of uterus    . Cesarean section  1969    only 4th of 4 children was c-section  . Av fistula placement  1970's    LFA  . Bladder removal  1974    with ileal conduit and urostomy   Family History  Problem Relation Age of Onset  . Heart attack Mother   . Healthy Daughter   . Healthy Daughter   . Healthy Son    History  Substance Use Topics  . Smoking status: Former Smoker -- 2 years    Types: Cigarettes    Quit date: 06/25/2004  . Smokeless tobacco: Never Used     Comment: 01/14/12 "just smoked one cigarette, once  in awhile for ~ 2 years"  . Alcohol Use: No  lives at home Lives with family    OB History   Grav Para Term Preterm Abortions TAB SAB Ect Mult Living                 Review of Systems  Unable to perform ROS: Mental status change    Allergies  Ketorolac tromethamine  Home Medications   Current Outpatient Rx  Name  Route  Sig  Dispense  Refill  . amLODipine (NORVASC) 5 MG tablet   Oral   Take 5 mg by mouth daily.         Marland Kitchen. aspirin EC 81 MG tablet   Oral   Take 81 mg by mouth daily.         . calcitRIOL (ROCALTROL) 0.5 MCG capsule   Oral   Take 0.5 mcg by mouth daily.         . carvedilol (COREG) 25 MG tablet   Oral   Take 25 mg by mouth 2 (two) times daily.         Marland Kitchen. doxepin (SINEQUAN) 50 MG capsule   Oral   Take 50 mg by mouth at bedtime.         . hydrALAZINE (APRESOLINE) 10 MG tablet   Oral   Take 10 mg by mouth 3 (three) times daily.         Marland Kitchen. HYDROcodone-acetaminophen (NORCO/VICODIN) 5-325 MG per tablet   Oral   Take 1 tablet by mouth every 8 (eight) hours as needed. pain         . alendronate (FOSAMAX) 70 MG tablet   Oral   Take 70 mg by mouth every 7 (seven) days. Take with a full glass of water on an empty stomach. Takes on Sunday.         . ALPRAZolam (XANAX) 0.5 MG tablet   Oral   Take 1 tablet (0.5 mg total) by mouth 3 (three) times daily.   90 tablet   2     Hold for sedation   . calcium citrate-vitamin D 200-200 MG-UNIT TABS   Oral   Take 1 tablet by mouth daily.           .Marland Kitchen  furosemide (LASIX) 40 MG tablet   Oral   Take 1 tablet (40 mg total) by mouth daily.   90 tablet   3   . Multiple Vitamin (MULTIVITAMIN) tablet   Oral   Take 1 tablet by mouth daily.           . nitroGLYCERIN (NITROSTAT) 0.4 MG SL tablet   Sublingual   Place 1 tablet (0.4 mg total) under the tongue every 5 (five) minutes as needed. For chest pain   25 tablet   5   . sodium bicarbonate 650 MG tablet   Oral   Take 1,300 mg by mouth 2  (two) times daily.           Triage Vitals: BP 103/44  Pulse 65  Temp(Src) 100.2 F (37.9 C) (Rectal)  Resp 30  SpO2 97%  Vital signs normal except for low grade temp on rectal    Physical Exam  Nursing note and vitals reviewed. Constitutional:  Non-toxic appearance. She does not appear ill. No distress.  Elderly frail female  HENT:  Head: Normocephalic and atraumatic.  Right Ear: External ear normal.  Left Ear: External ear normal.  Nose: Nose normal. No mucosal edema or rhinorrhea.  Mouth/Throat: Oropharynx is clear and moist and mucous membranes are normal. No dental abscesses or uvula swelling.  Eyes: Conjunctivae and EOM are normal. Pupils are equal, round, and reactive to light.  Neck: Normal range of motion and full passive range of motion without pain. Neck supple.  Cardiovascular: Normal rate, regular rhythm and normal heart sounds.  Exam reveals no gallop and no friction rub.   No murmur heard. Pulmonary/Chest: Effort normal and breath sounds normal. No respiratory distress. She has no wheezes. She has no rhonchi. She has no rales. She exhibits no tenderness and no crepitus.  Abdominal: Soft. Normal appearance and bowel sounds are normal. She exhibits no distension. There is no tenderness. There is no rebound and no guarding.  ileoileostomy in the RLQ, tissue defect and skin grafting in her left inguinal area and left thigh  Musculoskeletal: Normal range of motion. She exhibits no edema and no tenderness.  Moves all extremities well.   Neurological: She has normal strength. No cranial nerve deficit. Coordination normal.  Patient believes she is at [facility] in [place] and the year is 43. She answers questions inappropriately, such as talking about going fishing.   Skin: Skin is warm, dry and intact. No rash noted. No erythema. No pallor.  Psychiatric: She has a normal mood and affect. Her speech is normal. Her mood appears not anxious.  Trying to bit the  electrical cords    ED Course  Procedures (including critical care time)  Medications  sodium chloride 0.9 % bolus 500 mL (not administered)  0.9 %  sodium chloride infusion (not administered)  potassium chloride SA (K-DUR,KLOR-CON) CR tablet 40 mEq (not administered)    DIAGNOSTIC STUDIES: Oxygen Saturation is 97% on room air, adequate by my interpretation.    COORDINATION OF CARE: 7:08 PM -Will order IV fluids, chest x-ray, head CT, blood cultures, acetaminophen level, salicylate level, Troponin I, CK, CBC, CMP, Ethanol, Urinalysis and Drug Screen Panel.  Patient's family verbalizes understanding and agrees with treatment plan.  9:50 PM - Patient unchanged.   23:50 Dr Lovell Sheehan, want me to try femoral line placement and call him if unsuccessful  Nursing staff has tried multiple times to get IV access because her veins blow once stuck.  [place] used and patient has  small vein usually lying under the artery in her brachial, antecubital areas.    [place] used to locate the femoral vein. Her right groin was sterilely prepped. Nursing staff held the US probe and I attempted to canulate the femoral vein and appeared to be in the vein but the blood was pulsatile when the needle was withdrawn so  the whole needle was withdrawn and attempted again without success.  00:30 Dr Lovell Sheehan is coming to do IV placement.  00:50 Dr Rito Ehrlich, admit to tele    Results for orders placed during the hospital encounter of 07/26/13  CULTURE, BLOOD (ROUTINE X 2)      Result Value Range   Specimen Description Blood BLOOD LEFT ARM     Special Requests BOTTLES DRAWN AEROBIC ONLY 5CC     Culture PENDING     Report Status PENDING    CULTURE, BLOOD (ROUTINE X 2)      Result Value Range   Specimen Description Blood LEFT ANTECUBITAL     Special Requests BOTTLES DRAWN AEROBIC AND ANAEROBIC 6CC     Culture PENDING     Report Status PENDING    CBC WITH DIFFERENTIAL      Result Value Range   WBC 22.2 (*) 4.0 - 10.5  K/uL   RBC 3.48 (*) 3.87 - 5.11 MIL/uL   Hemoglobin 11.8 (*) 12.0 - 15.0 g/dL   HCT 16.1 (*) 09.6 - 04.5 %   MCV 96.0  78.0 - 100.0 fL   MCH 33.9  26.0 - 34.0 pg   MCHC 35.3  30.0 - 36.0 g/dL   RDW 40.9 (*) 81.1 - 91.4 %   Platelets 232  150 - 400 K/uL   Neutrophils Relative % 84 (*) 43 - 77 %   Neutro Abs 18.7 (*) 1.7 - 7.7 K/uL   Lymphocytes Relative 8 (*) 12 - 46 %   Lymphs Abs 1.8  0.7 - 4.0 K/uL   Monocytes Relative 8  3 - 12 %   Monocytes Absolute 1.7 (*) 0.1 - 1.0 K/uL   Eosinophils Relative 0  0 - 5 %   Eosinophils Absolute 0.0  0.0 - 0.7 K/uL   Basophils Relative 0  0 - 1 %   Basophils Absolute 0.0  0.0 - 0.1 K/uL  COMPREHENSIVE METABOLIC PANEL      Result Value Range   Sodium 133 (*) 137 - 147 mEq/L   Potassium 2.8 (*) 3.7 - 5.3 mEq/L   Chloride 96  96 - 112 mEq/L   CO2 20  19 - 32 mEq/L   Glucose, Bld 116 (*) 70 - 99 mg/dL   BUN 83 (*) 6 - 23 mg/dL   Creatinine, Ser 7.82 (*) 0.50 - 1.10 mg/dL   Calcium 95.6  8.4 - 21.3 mg/dL   Total Protein 6.9  6.0 - 8.3 g/dL   Albumin 2.7 (*) 3.5 - 5.2 g/dL   AST 12  0 - 37 U/L   ALT 10  0 - 35 U/L   Alkaline Phosphatase 62  39 - 117 U/L   Total Bilirubin 0.3  0.3 - 1.2 mg/dL   GFR calc non Af Amer 16 (*) >90 mL/min   GFR calc Af Amer 19 (*) >90 mL/min  ETHANOL      Result Value Range   Alcohol, Ethyl (B) <11  0 - 11 mg/dL  URINALYSIS, ROUTINE W REFLEX MICROSCOPIC      Result Value Range   Color, Urine YELLOW  YELLOW  APPearance HAZY (*) CLEAR   Specific Gravity, Urine 1.010  1.005 - 1.030   pH 6.0  5.0 - 8.0   Glucose, UA NEGATIVE  NEGATIVE mg/dL   Hgb urine dipstick TRACE (*) NEGATIVE   Bilirubin Urine NEGATIVE  NEGATIVE   Ketones, ur NEGATIVE  NEGATIVE mg/dL   Protein, ur NEGATIVE  NEGATIVE mg/dL   Urobilinogen, UA 0.2  0.0 - 1.0 mg/dL   Nitrite POSITIVE (*) NEGATIVE   Leukocytes, UA NEGATIVE  NEGATIVE  URINE RAPID DRUG SCREEN (HOSP PERFORMED)      Result Value Range   Opiates POSITIVE (*) NONE DETECTED    Cocaine NONE DETECTED  NONE DETECTED   Benzodiazepines POSITIVE (*) NONE DETECTED   Amphetamines NONE DETECTED  NONE DETECTED   Tetrahydrocannabinol NONE DETECTED  NONE DETECTED   Barbiturates NONE DETECTED  NONE DETECTED  ACETAMINOPHEN LEVEL      Result Value Range   Acetaminophen (Tylenol), Serum <15.0  10 - 30 ug/mL  SALICYLATE LEVEL      Result Value Range   Salicylate Lvl <2.0 (*) 2.8 - 20.0 mg/dL  TROPONIN I      Result Value Range   Troponin I <0.30  <0.30 ng/mL  CK      Result Value Range   Total CK 41  7 - 177 U/L  LACTIC ACID, PLASMA      Result Value Range   Lactic Acid, Venous 0.5  0.5 - 2.2 mmol/L  URINE MICROSCOPIC-ADD ON      Result Value Range   WBC, UA 21-50  <3 WBC/hpf   Bacteria, UA MANY (*) RARE   Laboratory interpretation normal except for ? UTI since urine source is not clear (from ileo urostomy site), worsening or her CRI. hypokalemia   Ct Head Wo Contrast  07/26/2013   CLINICAL DATA:  Dementia.  Altered mental status.  EXAM: CT HEAD WITHOUT CONTRAST  TECHNIQUE: Contiguous axial images were obtained from the base of the skull through the vertex without intravenous contrast.  COMPARISON:  Head CT scan and 04/29/2013.  FINDINGS: Again seen are atrophy and chronic microvascular ischemic change. No evidence of acute abnormality including infarction, hemorrhage, mass lesion, mass effect, midline shift or abnormal extra axial fluid collection. Cavum septum pellucidum is noted. The calvarium is intact.  IMPRESSION: No acute finding. Atrophy and chronic microvascular ischemic change.   Electronically Signed   By: Drusilla Kanner M.D.   On: 07/26/2013 21:59   Dg Chest Portable 1 View  07/26/2013   CLINICAL DATA:  Altered mental status  EXAM: PORTABLE CHEST - 1 VIEW  COMPARISON:  DG THORACIC SPINE dated 01/15/2013; DG CHEST 1V PORT dated 05/27/2012  FINDINGS: The heart size and mediastinal contours are within normal limits. Both lungs are clear. The visualized skeletal  structures are unremarkable.  IMPRESSION: No active disease.   Electronically Signed   By: Elige Ko   On: 07/26/2013 19:10      EKG Interpretation    Date/Time:  Sunday July 26 2013 19:23:19 EST Ventricular Rate:  68 PR Interval:  154 QRS Duration: 88 QT Interval:  406 QTC Calculation: 431 R Axis:   55 Text Interpretation:  Normal sinus rhythm Nonspecific ST abnormality When compared with ECG of 15-Jan-2013 15:08, Vent. rate has increased BY  25 BPM Minimal criteria for Anterior infarct are no longer Present Non-specific change in ST segment in Inferior leads ST now depressed in Lateral leads QT has lengthened Confirmed by Adriell Polansky  MD-I, Meiko Ives ([id number]) on  07/26/2013 7:52:24 PM            MDM   1. Fever   2. Hypokalemia   3. Acute on chronic renal insufficiency     Plan admission   Devoria Albe, MD, FACEP  I personally performed the services described in this documentation, which was scribed in my presence. The recorded information has been reviewed and considered.  Devoria Albe, MD, FACEP    Ward Givens, MD 07/27/13 [id number]

## 2013-07-26 NOTE — ED Notes (Signed)
Pt has iliostomy, full on arrival. Emptied out

## 2013-07-27 ENCOUNTER — Encounter (HOSPITAL_COMMUNITY): Payer: Self-pay | Admitting: Internal Medicine

## 2013-07-27 DIAGNOSIS — N189 Chronic kidney disease, unspecified: Secondary | ICD-10-CM | POA: Diagnosis present

## 2013-07-27 DIAGNOSIS — F411 Generalized anxiety disorder: Secondary | ICD-10-CM

## 2013-07-27 DIAGNOSIS — G92 Toxic encephalopathy: Secondary | ICD-10-CM

## 2013-07-27 DIAGNOSIS — R1032 Left lower quadrant pain: Secondary | ICD-10-CM

## 2013-07-27 DIAGNOSIS — G929 Unspecified toxic encephalopathy: Secondary | ICD-10-CM

## 2013-07-27 DIAGNOSIS — N289 Disorder of kidney and ureter, unspecified: Secondary | ICD-10-CM

## 2013-07-27 DIAGNOSIS — N390 Urinary tract infection, site not specified: Principal | ICD-10-CM

## 2013-07-27 DIAGNOSIS — G8929 Other chronic pain: Secondary | ICD-10-CM

## 2013-07-27 DIAGNOSIS — E876 Hypokalemia: Secondary | ICD-10-CM

## 2013-07-27 DIAGNOSIS — D72829 Elevated white blood cell count, unspecified: Secondary | ICD-10-CM | POA: Diagnosis present

## 2013-07-27 DIAGNOSIS — D649 Anemia, unspecified: Secondary | ICD-10-CM

## 2013-07-27 DIAGNOSIS — R509 Fever, unspecified: Secondary | ICD-10-CM

## 2013-07-27 LAB — COMPREHENSIVE METABOLIC PANEL
ALK PHOS: 77 U/L (ref 39–117)
ALT: 8 U/L (ref 0–35)
AST: 12 U/L (ref 0–37)
Albumin: 2.3 g/dL — ABNORMAL LOW (ref 3.5–5.2)
BUN: 77 mg/dL — AB (ref 6–23)
CALCIUM: 9.1 mg/dL (ref 8.4–10.5)
CO2: 18 mEq/L — ABNORMAL LOW (ref 19–32)
Chloride: 106 mEq/L (ref 96–112)
Creatinine, Ser: 2.38 mg/dL — ABNORMAL HIGH (ref 0.50–1.10)
GFR calc Af Amer: 22 mL/min — ABNORMAL LOW (ref >90)
GFR calc non Af Amer: 19 mL/min — ABNORMAL LOW (ref >90)
Glucose, Bld: 101 mg/dL — ABNORMAL HIGH (ref 70–99)
Potassium: 3.1 mEq/L — ABNORMAL LOW (ref 3.7–5.3)
SODIUM: 141 meq/L (ref 137–147)
TOTAL PROTEIN: 6 g/dL (ref 6.0–8.3)
Total Bilirubin: 0.3 mg/dL (ref 0.3–1.2)

## 2013-07-27 LAB — TROPONIN I: Troponin I: <0.3 ng/mL (ref <0.30)

## 2013-07-27 LAB — CBC
HEMATOCRIT: 29 % — AB (ref 36.0–46.0)
HEMOGLOBIN: 10.4 g/dL — AB (ref 12.0–15.0)
MCH: 34.6 pg — ABNORMAL HIGH (ref 26.0–34.0)
MCHC: 35.9 g/dL (ref 30.0–36.0)
MCV: 96.3 fL (ref 78.0–100.0)
Platelets: 210 10*3/uL (ref 150–400)
RBC: 3.01 MIL/uL — ABNORMAL LOW (ref 3.87–5.11)
RDW: 16.3 % — AB (ref 11.5–15.5)
WBC: 18.7 10*3/uL — AB (ref 4.0–10.5)

## 2013-07-27 MED ORDER — ONDANSETRON HCL 4 MG PO TABS: 4.0000 mg | ORAL_TABLET | Freq: Four times a day (QID) | ORAL | Status: DC | PRN

## 2013-07-27 MED ORDER — SODIUM CHLORIDE 0.9 % IJ SOLN
3.0000 mL | Freq: Two times a day (BID) | INTRAMUSCULAR | Status: DC
  Administered 2013-07-27 – 2013-07-29 (×4): 3 mL via INTRAVENOUS

## 2013-07-27 MED ORDER — SODIUM BICARBONATE 650 MG PO TABS
1300.0000 mg | ORAL_TABLET | Freq: Two times a day (BID) | ORAL | Status: DC
  Administered 2013-07-27 (×3): 1300 mg via ORAL
  Filled 2013-07-27 (×3): qty 2

## 2013-07-27 MED ORDER — HEPARIN SODIUM (PORCINE) 5000 UNIT/ML IJ SOLN
5000.0000 [IU] | Freq: Three times a day (TID) | INTRAMUSCULAR | Status: DC
  Administered 2013-07-27 – 2013-07-29 (×7): 5000 [IU] via SUBCUTANEOUS
  Filled 2013-07-27 (×7): qty 1

## 2013-07-27 MED ORDER — SODIUM CHLORIDE 0.9 % IV SOLN
INTRAVENOUS | Status: DC
  Administered 2013-07-27 – 2013-07-28 (×4): via INTRAVENOUS

## 2013-07-27 MED ORDER — ALBUTEROL SULFATE (2.5 MG/3ML) 0.083% IN NEBU: 2.5000 mg | INHALATION_SOLUTION | RESPIRATORY_TRACT | Status: DC | PRN

## 2013-07-27 MED ORDER — CARVEDILOL 3.125 MG PO TABS
6.2500 mg | ORAL_TABLET | Freq: Two times a day (BID) | ORAL | Status: DC
  Administered 2013-07-27 – 2013-07-29 (×5): 6.25 mg via ORAL
  Filled 2013-07-27 (×5): qty 2

## 2013-07-27 MED ORDER — HYDROCODONE-ACETAMINOPHEN 5-325 MG PO TABS: 1.0000 | ORAL_TABLET | ORAL | Status: DC | PRN

## 2013-07-27 MED ORDER — POTASSIUM CHLORIDE CRYS ER 20 MEQ PO TBCR
40.0000 meq | EXTENDED_RELEASE_TABLET | ORAL | Status: AC
  Administered 2013-07-27 (×2): 40 meq via ORAL
  Filled 2013-07-27 (×2): qty 2

## 2013-07-27 MED ORDER — DOCUSATE SODIUM 100 MG PO CAPS
100.0000 mg | ORAL_CAPSULE | Freq: Two times a day (BID) | ORAL | Status: DC
  Administered 2013-07-27 – 2013-07-29 (×6): 100 mg via ORAL
  Filled 2013-07-27 (×6): qty 1

## 2013-07-27 MED ORDER — ASPIRIN EC 81 MG PO TBEC
81.0000 mg | DELAYED_RELEASE_TABLET | Freq: Every day | ORAL | Status: DC
  Administered 2013-07-27 – 2013-07-29 (×3): 81 mg via ORAL
  Filled 2013-07-27 (×3): qty 1

## 2013-07-27 MED ORDER — DEXTROSE 5 % IV SOLN
1.0000 g | INTRAVENOUS | Status: DC
  Administered 2013-07-27 – 2013-07-29 (×3): 1 g via INTRAVENOUS
  Filled 2013-07-27 (×4): qty 10

## 2013-07-27 MED ORDER — ACETAMINOPHEN 650 MG RE SUPP: 650.0000 mg | Freq: Four times a day (QID) | RECTAL | Status: DC | PRN

## 2013-07-27 MED ORDER — ACETAMINOPHEN 325 MG PO TABS
650.0000 mg | ORAL_TABLET | Freq: Four times a day (QID) | ORAL | Status: DC | PRN
  Administered 2013-07-27: 650 mg via ORAL
  Filled 2013-07-27: qty 2

## 2013-07-27 MED ORDER — ONDANSETRON HCL 4 MG/2ML IJ SOLN: 4.0000 mg | Freq: Four times a day (QID) | INTRAMUSCULAR | Status: DC | PRN

## 2013-07-27 MED ORDER — CALCITRIOL 0.25 MCG PO CAPS
0.5000 ug | ORAL_CAPSULE | Freq: Every day | ORAL | Status: DC
  Administered 2013-07-27 – 2013-07-29 (×3): 0.5 ug via ORAL
  Filled 2013-07-27 (×3): qty 2

## 2013-07-27 NOTE — Progress Notes (Signed)
Utilization Review Complete  

## 2013-07-27 NOTE — H&P (Signed)
[facility] History and Physical  [name] PVV:[id number] DOB: Nov 15, 1940 DOA: 07/26/2013   PCP: Rogene Houston, MD  Specialists: Has been followed by cardiology, Dr. Acie Fredrickson  Chief Complaint: Confusion at home  HPI: [name] is a 73 y.o. female with a past medical history of dementia, chronic systolic heart failure with improved cardiac function in 2013, hypertension, chronic kidney disease, nephrectomy, bladder resection with right urostomy and ileal conduit, who lives with her husband. Patient has dementia at baseline and is confused at this time. So, not much history was available from her. Unfortunately, her family is not available at this time either. According to nursing reports and ED physician notes, patient was brought in because she has had worsening in her confusion. She had been lying in the couch all day long without doing any activities. She also appeared to be more restless than normal. Patient takes medications for insomnia and it appears than 7 tablets were missing after accounting for her daily dosage. Patient is unable to tell us if she took more tablets than she should have. She was also found to have a low-grade temperature in the emergency department and had elevated WBC and abnormal UA. Workup revealed that she had acute on chronic renal failure and we were called for further management.  Home Medications: Prior to Admission medications   Medication Sig Start Date End Date Taking? Authorizing Provider  amLODipine (NORVASC) 5 MG tablet Take 5 mg by mouth daily.   Yes Historical Provider, MD  aspirin EC 81 MG tablet Take 81 mg by mouth daily.   Yes Historical Provider, MD  calcitRIOL (ROCALTROL) 0.5 MCG capsule Take 0.5 mcg by mouth daily.   Yes Historical Provider, MD  carvedilol (COREG) 25 MG tablet Take 25 mg by mouth 2 (two) times daily.   Yes Historical Provider, MD  doxepin (SINEQUAN) 50 MG capsule Take 50 mg by mouth at bedtime.   Yes Historical  Provider, MD  hydrALAZINE (APRESOLINE) 10 MG tablet Take 10 mg by mouth 3 (three) times daily.   Yes Historical Provider, MD  HYDROcodone-acetaminophen (NORCO/VICODIN) 5-325 MG per tablet Take 1 tablet by mouth every 8 (eight) hours as needed. pain   Yes Historical Provider, MD  alendronate (FOSAMAX) 70 MG tablet Take 70 mg by mouth every 7 (seven) days. Take with a full glass of water on an empty stomach. Takes on Sunday.    Historical Provider, MD  ALPRAZolam Duanne Moron) 0.5 MG tablet Take 1 tablet (0.5 mg total) by mouth 3 (three) times daily. 05/20/13   Butch [name], NP  calcium citrate-vitamin D 200-200 MG-UNIT TABS Take 1 tablet by mouth daily.      Historical Provider, MD  furosemide (LASIX) 40 MG tablet Take 1 tablet (40 mg total) by mouth daily. 06/16/13   Thayer Headings, MD  Multiple Vitamin (MULTIVITAMIN) tablet Take 1 tablet by mouth daily.      Historical Provider, MD  nitroGLYCERIN (NITROSTAT) 0.4 MG SL tablet Place 1 tablet (0.4 mg total) under the tongue every 5 (five) minutes as needed. For chest pain 06/16/12   Thayer Headings, MD  sodium bicarbonate 650 MG tablet Take 1,300 mg by mouth 2 (two) times daily.     Historical Provider, MD    Allergies:  Allergies  Allergen Reactions  . Ketorolac Tromethamine Rash    Past Medical History: Past Medical History  Diagnosis Date  . Chronic kidney disease   . Gastroesophageal reflux   . Chronic UTI   .  Hydronephrosis     She has chronic mild left   . AV fistula     clotted in her left forearm  . Cellulitis   . Pneumothorax     secondary to central line placement  . Single kidney     post right nephrectomy  . Myocardial infarction 2011; 2012  . Numbness and tingling of both legs   . Anginal pain   . Heart murmur   . Dysrhythmia     "heart beats real fast"  . Chronic anemia   . Pneumonia     "one time"  . Chronic chest pain 01/14/12    1-2 X/day/H&P  . Arthritis     "at different places"  . Anxiety   . Depression     . Headache(784.0)     "alot; not daily"  . Frequent falls   . Dementia   . Collagen vascular disease   . Renal insufficiency   . S/P ileal conduit     post cystectomy/ileal conduit  . Anemia     Received IV Feraheme (x) 2 at [facility]  . Iron deficiency     Received IV Feraheme (x) 2 at [facility].  . Dysarthria 03/31/13    Speech is almost a little bit dysarthritic  . Unsteady     Stronger since she came out of nursing home & is walking with a cane  . Edema 03/31/13    Gained 9 pounds overnight & telephone nurse instructed her to take 33m of Lasix bid on 03/30/13. Still has swelling in legs.  . Metabolic acidosis     Chronic  . Hypertension     Good control  . CHF (congestive heart failure)     Systolic heart failure EF 25-30% by ECHO 2012  . Altered mental state     Recurrent episodes, with confusion variously related to narcotic use, infection, etc. and has required hospitilization, nursing home stays for rehab related to weakness, encephalopathy, etc.  . History of sepsis 2012    With E.Coli bacteremia  . E coli bacteremia 2012    History of sepsis & E.Coli bacteremia  . Secondary hyperparathyroidism     Dr. FHassell Donehad a sestamibi scan of her neck in May 2014 which was negative for parathyroid activity in her neck.    Past Surgical History  Procedure Laterality Date  . Hip arthroplasty      left; S/P fracture  . Rod left leg  2012    left hip "to almost my knee; for my hip"  . Creation / revision of ileostomy / jejunostomy  1973  . Nephrectomy  1973    right  . Fracture surgery    . Partial colectomy  1970  . Tonsillectomy and adenoidectomy      "when I was a kid"  . Cholecystectomy  2000  . Appendectomy  2000  . Abdominal hysterectomy    . Dilation and curettage of uterus    . Cesarean section  1969    only 4th of 4 children was c-section  . Av fistula placement  1970's    LFA  . Bladder removal  1974    with ileal conduit and urostomy    Social History:  She apparently lives with her husband. No further social history is available from the patient.  Family History:  Family History  Problem Relation Age of Onset  . Heart attack Mother   . Healthy Daughter   . Healthy Daughter   .  Healthy Son      Review of Systems - unable to do due to her dementia  Physical Examination  Filed Vitals:   07/26/13 1841 07/26/13 1849  BP: 103/44   Pulse: 65   Temp:  100.2 F (37.9 C)  TempSrc:  Rectal  Resp: 30   SpO2: 97%     General appearance: alert, appears stated age, distracted and no distress Head: Normocephalic, without obvious abnormality, atraumatic Eyes: conjunctivae/corneas clear. PERRL, EOM's intact. Throat: very dry mm Neck: no adenopathy, no carotid bruit, no JVD, supple, symmetrical, trachea midline and thyroid not enlarged, symmetric, no tenderness/mass/nodules Resp: clear to auscultation bilaterally Cardio: regular rate and rhythm, S1, S2 normal, no murmur, click, rub or gallop GI: Urostomy is noted. Scars from previous surgery appreciated. Mildly tender in the left lower quadrant without any rebound, rigidity, or guarding. No masses, organomegaly. Extremities: extremities normal, atraumatic, no cyanosis or edema Pulses: 2+ and symmetric Skin: Skin color, texture, turgor normal. No rashes or lesions Neurologic: She is alert, but confused. He would follow certain commands. Tongue is midline. No facial droop. Motor strength equal bilateral upper and lower extremities. Gait was not assessed.  Laboratory Data: Results for orders placed during the hospital encounter of 07/26/13 (from the past 48 hour(s))  CULTURE, BLOOD (ROUTINE X 2)     Status: None   Collection Time    07/26/13  8:00 PM      Result Value Range   Specimen Description Blood BLOOD LEFT ARM     Special Requests BOTTLES DRAWN AEROBIC ONLY 5CC     Culture PENDING     Report Status PENDING    CBC WITH DIFFERENTIAL     Status: Abnormal   Collection Time     07/26/13  8:33 PM      Result Value Range   WBC 22.2 (*) 4.0 - 10.5 K/uL   RBC 3.48 (*) 3.87 - 5.11 MIL/uL   Hemoglobin 11.8 (*) 12.0 - 15.0 g/dL   HCT 33.4 (*) 36.0 - 46.0 %   MCV 96.0  78.0 - 100.0 fL   MCH 33.9  26.0 - 34.0 pg   MCHC 35.3  30.0 - 36.0 g/dL   RDW 16.2 (*) 11.5 - 15.5 %   Platelets 232  150 - 400 K/uL   Neutrophils Relative % 84 (*) 43 - 77 %   Neutro Abs 18.7 (*) 1.7 - 7.7 K/uL   Lymphocytes Relative 8 (*) 12 - 46 %   Lymphs Abs 1.8  0.7 - 4.0 K/uL   Monocytes Relative 8  3 - 12 %   Monocytes Absolute 1.7 (*) 0.1 - 1.0 K/uL   Eosinophils Relative 0  0 - 5 %   Eosinophils Absolute 0.0  0.0 - 0.7 K/uL   Basophils Relative 0  0 - 1 %   Basophils Absolute 0.0  0.0 - 0.1 K/uL  COMPREHENSIVE METABOLIC PANEL     Status: Abnormal   Collection Time    07/26/13  8:33 PM      Result Value Range   Sodium 133 (*) 137 - 147 mEq/L   Potassium 2.8 (*) 3.7 - 5.3 mEq/L   Comment: CRITICAL RESULT CALLED TO, READ BACK BY AND VERIFIED WITH:     J.JOHNSON AT 2114 ON 07/26/2013 BY S.VANHOORNE   Chloride 96  96 - 112 mEq/L   CO2 20  19 - 32 mEq/L   Glucose, Bld 116 (*) 70 - 99 mg/dL   BUN 83 (*) 6 -  23 mg/dL   Creatinine, Ser 2.69 (*) 0.50 - 1.10 mg/dL   Calcium 10.2  8.4 - 10.5 mg/dL   Total Protein 6.9  6.0 - 8.3 g/dL   Albumin 2.7 (*) 3.5 - 5.2 g/dL   AST 12  0 - 37 U/L   ALT 10  0 - 35 U/L   Alkaline Phosphatase 62  39 - 117 U/L   Total Bilirubin 0.3  0.3 - 1.2 mg/dL   GFR calc non Af Amer 16 (*) >90 mL/min   GFR calc Af Amer 19 (*) >90 mL/min   Comment: (NOTE)     The eGFR has been calculated using the CKD EPI equation.     This calculation has not been validated in all clinical situations.     eGFR's persistently <90 mL/min signify possible Chronic Kidney     Disease.  ETHANOL     Status: None   Collection Time    07/26/13  8:33 PM      Result Value Range   Alcohol, Ethyl (B) <11  0 - 11 mg/dL   Comment:            LOWEST DETECTABLE LIMIT FOR     SERUM ALCOHOL IS  11 mg/dL     FOR MEDICAL PURPOSES ONLY  ACETAMINOPHEN LEVEL     Status: None   Collection Time    07/26/13  8:33 PM      Result Value Range   Acetaminophen (Tylenol), Serum <15.0  10 - 30 ug/mL   Comment:            THERAPEUTIC CONCENTRATIONS VARY     SIGNIFICANTLY. A RANGE OF 10-30     ug/mL MAY BE AN EFFECTIVE     CONCENTRATION FOR MANY PATIENTS.     HOWEVER, SOME ARE BEST TREATED     AT CONCENTRATIONS OUTSIDE THIS     RANGE.     ACETAMINOPHEN CONCENTRATIONS     >150 ug/mL AT 4 HOURS AFTER     INGESTION AND >50 ug/mL AT 12     HOURS AFTER INGESTION ARE     OFTEN ASSOCIATED WITH TOXIC     REACTIONS.  SALICYLATE LEVEL     Status: Abnormal   Collection Time    07/26/13  8:33 PM      Result Value Range   Salicylate Lvl <8.1 (*) 2.8 - 20.0 mg/dL  TROPONIN I     Status: None   Collection Time    07/26/13  8:33 PM      Result Value Range   Troponin I <0.30  <0.30 ng/mL   Comment:            Due to the release kinetics of cTnI,     a negative result within the first hours     of the onset of symptoms does not rule out     myocardial infarction with certainty.     If myocardial infarction is still suspected,     repeat the test at appropriate intervals.  CK     Status: None   Collection Time    07/26/13  8:33 PM      Result Value Range   Total CK 41  7 - 177 U/L  LACTIC ACID, PLASMA     Status: None   Collection Time    07/26/13  8:33 PM      Result Value Range   Lactic Acid, Venous 0.5  0.5 - 2.2 mmol/L  CULTURE, BLOOD (  ROUTINE X 2)     Status: None   Collection Time    07/26/13  8:34 PM      Result Value Range   Specimen Description Blood LEFT ANTECUBITAL     Special Requests BOTTLES DRAWN AEROBIC AND ANAEROBIC 6CC     Culture PENDING     Report Status PENDING    URINALYSIS, ROUTINE W REFLEX MICROSCOPIC     Status: Abnormal   Collection Time    07/26/13  9:14 PM      Result Value Range   Color, Urine YELLOW  YELLOW   APPearance HAZY (*) CLEAR   Specific  Gravity, Urine 1.010  1.005 - 1.030   pH 6.0  5.0 - 8.0   Glucose, UA NEGATIVE  NEGATIVE mg/dL   Hgb urine dipstick TRACE (*) NEGATIVE   Bilirubin Urine NEGATIVE  NEGATIVE   Ketones, ur NEGATIVE  NEGATIVE mg/dL   Protein, ur NEGATIVE  NEGATIVE mg/dL   Urobilinogen, UA 0.2  0.0 - 1.0 mg/dL   Nitrite POSITIVE (*) NEGATIVE   Leukocytes, UA NEGATIVE  NEGATIVE  URINE RAPID DRUG SCREEN (HOSP PERFORMED)     Status: Abnormal   Collection Time    07/26/13  9:14 PM      Result Value Range   Opiates POSITIVE (*) NONE DETECTED   Cocaine NONE DETECTED  NONE DETECTED   Benzodiazepines POSITIVE (*) NONE DETECTED   Amphetamines NONE DETECTED  NONE DETECTED   Tetrahydrocannabinol NONE DETECTED  NONE DETECTED   Barbiturates NONE DETECTED  NONE DETECTED   Comment:            DRUG SCREEN FOR MEDICAL PURPOSES     ONLY.  IF CONFIRMATION IS NEEDED     FOR ANY PURPOSE, NOTIFY LAB     WITHIN 5 DAYS.                LOWEST DETECTABLE LIMITS     FOR URINE DRUG SCREEN     Drug Class       Cutoff (ng/mL)     Amphetamine      1000     Barbiturate      200     Benzodiazepine   277     Tricyclics       824     Opiates          300     Cocaine          300     THC              50  URINE MICROSCOPIC-ADD ON     Status: Abnormal   Collection Time    07/26/13  9:14 PM      Result Value Range   WBC, UA 21-50  <3 WBC/hpf   Bacteria, UA MANY (*) RARE    Radiology Reports: Ct Head Wo Contrast  07/26/2013   CLINICAL DATA:  Dementia.  Altered mental status.  EXAM: CT HEAD WITHOUT CONTRAST  TECHNIQUE: Contiguous axial images were obtained from the base of the skull through the vertex without intravenous contrast.  COMPARISON:  Head CT scan and 04/29/2013.  FINDINGS: Again seen are atrophy and chronic microvascular ischemic change. No evidence of acute abnormality including infarction, hemorrhage, mass lesion, mass effect, midline shift or abnormal extra axial fluid collection. Cavum septum pellucidum is noted. The  calvarium is intact.  IMPRESSION: No acute finding. Atrophy and chronic microvascular ischemic change.   Electronically Signed   By: Marcello Moores  Dalessio M.D.   On: 07/26/2013 21:59   Dg Chest Portable 1 View  07/26/2013   CLINICAL DATA:  Altered mental status  EXAM: PORTABLE CHEST - 1 VIEW  COMPARISON:  DG THORACIC SPINE dated 01/15/2013; DG CHEST 1V PORT dated 05/27/2012  FINDINGS: The heart size and mediastinal contours are within normal limits. Both lungs are clear. The visualized skeletal structures are unremarkable.  IMPRESSION: No active disease.   Electronically Signed   By: Kathreen Devoid   On: 07/26/2013 19:10    Electrocardiogram: Sinus rhythm at 60 beats per minute. Normal axis. Intervals are normal. Subtle ST changes noted diffusely. These are nonspecific.  Problem List  Principal Problem:   Acute on chronic renal insufficiency Active Problems:   Dementia   UTI (lower urinary tract infection)   Hypokalemia   Encephalopathy, toxic   Abdominal pain, chronic, left lower quadrant   Leukocytosis   Assessment: This is a 73 year old, Caucasian female, presents with worsening confusion at home. She has low-grade fever with leukocytosis and abnormal UA. I think the reason for her encephalopathy is multifactorial, including UTI, secondary to medication use and Uremia. CT head does not show any acute changes. She also appears to be quite dehydrated with acute on chronic renal failure  Plan: #1 acute renal failure on chronic kidney disease: Usual baseline creatinine, appears to be around 1.5-1.6. BUN is also significantly elevated. She'll be given IV fluids. Her diuretics will be held for now. Echocardiogram from 2013 showed improvement in her systolic function. Monitor volume status closely. Monitor urine output closely.  #2 urinary tract infection with leukocytosis in the setting of urostomy and ileal conduit: Previous culture reports have been reviewed. She had a similar hospitalization in July  of 2014. Ceftriaxone would be an appropriate choice, which will be administered. Urine cultures will be followed up on as well.  #3 acute encephalopathy: Likely multifactorial as discussed above. She should return to baseline with the treatment of infection. We will hold all of her psychotropic agents. Hold any other medication that may cause impairment of her mental status.  #4 history of chronic systolic heart failure: This appears to have improved based on echocardiogram from 2013. Continue with beta blocker at lower does than normal due to her borderline low blood pressure. Holding parameters will be initiated. Monitor volume status closely. EKG was noted to be abnormal. This will be repeated in the morning. Troponin is reassuringly normal and will be repeated. Patient denies chest pain. Continue to monitor.  #5 hypokalemia: She'll be given a dose of potassium in the ED. Will not be too aggressive due to her renal failure. Repeat the blood levels in the morning.  #6 history of hypertension: Blood pressure is borderline low at this time. Hold her amlodipine and hydralazine for now.  A left femoral vein central line was placed by Dr. Arnoldo Morale due to poor venous access.  DVT Prophylaxis: Heparin Code Status: Full code is assumed based on previous records. No family is available at this time Family Communication: No family members available  Disposition Plan: Admit to telemetry   Further management decisions will depend on results of further testing and patient's response to treatment.  [facility]  [facility] Pager [phone]  If 7PM-7AM, please contact night-coverage [email or web address] Password [email or web address]  07/27/2013, 1:12 AM

## 2013-07-27 NOTE — Consult Note (Signed)
Reason for Consult: AKI  Superimposed on chronic Referring Physician: Dr. Andres Labrum is an 73 y.o. female.  HPI: She is on a patient who has history of right nephrectomy, history of bladder resection and status post ileal conduit placement presently was brought because of increased confusion and altered mental status. According to her husband yesterday she started complaining of weakness and became more confused and unable to get out of her chair. Presently patient is alert and states that she has problem with her kidneys for many years and was being followed by Dr. Hassell Done before he was retired. She states that her right nephrectomy was associated with pregnancy and not related to cancer. Recently however her her urine output has declined. She denies any nausea no vomiting. Presently she is feeling much better.   Past Medical History  Diagnosis Date  . Chronic kidney disease   . Gastroesophageal reflux   . Chronic UTI   . Hydronephrosis     She has chronic mild left   . AV fistula     clotted in her left forearm  . Cellulitis   . Pneumothorax     secondary to central line placement  . Single kidney     post right nephrectomy  . Myocardial infarction 2011; 2012  . Numbness and tingling of both legs   . Anginal pain   . Heart murmur   . Dysrhythmia     "heart beats real fast"  . Chronic anemia   . Pneumonia     "one time"  . Chronic chest pain 01/14/12    1-2 X/day/H&P  . Arthritis     "at different places"  . Anxiety   . Depression   . Headache(784.0)     "alot; not daily"  . Frequent falls   . Dementia   . Collagen vascular disease   . Renal insufficiency   . S/P ileal conduit     post cystectomy/ileal conduit  . Anemia     Received IV Feraheme (x) 2 at [facility]  . Iron deficiency     Received IV Feraheme (x) 2 at [facility].  . Dysarthria 03/31/13    Speech is almost a little bit dysarthritic  . Unsteady     Stronger since she came out of nursing home & is  walking with a cane  . Edema 03/31/13    Gained 9 pounds overnight & telephone nurse instructed her to take 56m of Lasix bid on 03/30/13. Still has swelling in legs.  . Metabolic acidosis     Chronic  . Hypertension     Good control  . CHF (congestive heart failure)     Systolic heart failure EF 25-30% by ECHO 2012  . Altered mental state     Recurrent episodes, with confusion variously related to narcotic use, infection, etc. and has required hospitilization, nursing home stays for rehab related to weakness, encephalopathy, etc.  . History of sepsis 2012    With E.Coli bacteremia  . E coli bacteremia 2012    History of sepsis & E.Coli bacteremia  . Secondary hyperparathyroidism     Dr. FHassell Donehad a sestamibi scan of her neck in May 2014 which was negative for parathyroid activity in her neck.    Past Surgical History  Procedure Laterality Date  . Hip arthroplasty      left; S/P fracture  . Rod left leg  2012    left hip "to almost my knee; for my hip"  .  Creation / revision of ileostomy / jejunostomy  1973  . Nephrectomy  1973    right  . Fracture surgery    . Partial colectomy  1970  . Tonsillectomy and adenoidectomy      "when I was a kid"  . Cholecystectomy  2000  . Appendectomy  2000  . Abdominal hysterectomy    . Dilation and curettage of uterus    . Cesarean section  1969    only 4th of 4 children was c-section  . Av fistula placement  1970's    LFA  . Bladder removal  1974    with ileal conduit and urostomy    Family History  Problem Relation Age of Onset  . Heart attack Mother   . Healthy Daughter   . Healthy Daughter   . Healthy Son     Social History:  reports that she quit smoking about 9 years ago. Her smoking use included Cigarettes. She smoked 0.00 packs per day for 2 years. She has never used smokeless tobacco. She reports that she does not drink alcohol or use illicit drugs.  Allergies:  Allergies  Allergen Reactions  . Ketorolac Tromethamine Rash     Medications: I have reviewed the patient's current medications.  Results for orders placed during the hospital encounter of 07/26/13 (from the past 48 hour(s))  CULTURE, BLOOD (ROUTINE X 2)     Status: None   Collection Time    07/26/13  8:00 PM      Result Value Range   Specimen Description Blood BLOOD LEFT ARM     Special Requests BOTTLES DRAWN AEROBIC ONLY 5CC     Culture NO GROWTH 1 DAY     Report Status PENDING    CBC WITH DIFFERENTIAL     Status: Abnormal   Collection Time    07/26/13  8:33 PM      Result Value Range   WBC 22.2 (*) 4.0 - 10.5 K/uL   RBC 3.48 (*) 3.87 - 5.11 MIL/uL   Hemoglobin 11.8 (*) 12.0 - 15.0 g/dL   HCT 33.4 (*) 36.0 - 46.0 %   MCV 96.0  78.0 - 100.0 fL   MCH 33.9  26.0 - 34.0 pg   MCHC 35.3  30.0 - 36.0 g/dL   RDW 16.2 (*) 11.5 - 15.5 %   Platelets 232  150 - 400 K/uL   Neutrophils Relative % 84 (*) 43 - 77 %   Neutro Abs 18.7 (*) 1.7 - 7.7 K/uL   Lymphocytes Relative 8 (*) 12 - 46 %   Lymphs Abs 1.8  0.7 - 4.0 K/uL   Monocytes Relative 8  3 - 12 %   Monocytes Absolute 1.7 (*) 0.1 - 1.0 K/uL   Eosinophils Relative 0  0 - 5 %   Eosinophils Absolute 0.0  0.0 - 0.7 K/uL   Basophils Relative 0  0 - 1 %   Basophils Absolute 0.0  0.0 - 0.1 K/uL  COMPREHENSIVE METABOLIC PANEL     Status: Abnormal   Collection Time    07/26/13  8:33 PM      Result Value Range   Sodium 133 (*) 137 - 147 mEq/L   Potassium 2.8 (*) 3.7 - 5.3 mEq/L   Comment: CRITICAL RESULT CALLED TO, READ BACK BY AND VERIFIED WITH:     J.JOHNSON AT 2114 ON 07/26/2013 BY S.VANHOORNE   Chloride 96  96 - 112 mEq/L   CO2 20  19 - 32 mEq/L  Glucose, Bld 116 (*) 70 - 99 mg/dL   BUN 83 (*) 6 - 23 mg/dL   Creatinine, Ser 2.69 (*) 0.50 - 1.10 mg/dL   Calcium 10.2  8.4 - 10.5 mg/dL   Total Protein 6.9  6.0 - 8.3 g/dL   Albumin 2.7 (*) 3.5 - 5.2 g/dL   AST 12  0 - 37 U/L   ALT 10  0 - 35 U/L   Alkaline Phosphatase 62  39 - 117 U/L   Total Bilirubin 0.3  0.3 - 1.2 mg/dL   GFR calc non  Af Amer 16 (*) >90 mL/min   GFR calc Af Amer 19 (*) >90 mL/min   Comment: (NOTE)     The eGFR has been calculated using the CKD EPI equation.     This calculation has not been validated in all clinical situations.     eGFR's persistently <90 mL/min signify possible Chronic Kidney     Disease.  ETHANOL     Status: None   Collection Time    07/26/13  8:33 PM      Result Value Range   Alcohol, Ethyl (B) <11  0 - 11 mg/dL   Comment:            LOWEST DETECTABLE LIMIT FOR     SERUM ALCOHOL IS 11 mg/dL     FOR MEDICAL PURPOSES ONLY  ACETAMINOPHEN LEVEL     Status: None   Collection Time    07/26/13  8:33 PM      Result Value Range   Acetaminophen (Tylenol), Serum <15.0  10 - 30 ug/mL   Comment:            THERAPEUTIC CONCENTRATIONS VARY     SIGNIFICANTLY. A RANGE OF 10-30     ug/mL MAY BE AN EFFECTIVE     CONCENTRATION FOR MANY PATIENTS.     HOWEVER, SOME ARE BEST TREATED     AT CONCENTRATIONS OUTSIDE THIS     RANGE.     ACETAMINOPHEN CONCENTRATIONS     >150 ug/mL AT 4 HOURS AFTER     INGESTION AND >50 ug/mL AT 12     HOURS AFTER INGESTION ARE     OFTEN ASSOCIATED WITH TOXIC     REACTIONS.  SALICYLATE LEVEL     Status: Abnormal   Collection Time    07/26/13  8:33 PM      Result Value Range   Salicylate Lvl <0.9 (*) 2.8 - 20.0 mg/dL  TROPONIN I     Status: None   Collection Time    07/26/13  8:33 PM      Result Value Range   Troponin I <0.30  <0.30 ng/mL   Comment:            Due to the release kinetics of cTnI,     a negative result within the first hours     of the onset of symptoms does not rule out     myocardial infarction with certainty.     If myocardial infarction is still suspected,     repeat the test at appropriate intervals.  CK     Status: None   Collection Time    07/26/13  8:33 PM      Result Value Range   Total CK 41  7 - 177 U/L  LACTIC ACID, PLASMA     Status: None   Collection Time    07/26/13  8:33 PM      Result Value  Range   Lactic Acid,  Venous 0.5  0.5 - 2.2 mmol/L  CULTURE, BLOOD (ROUTINE X 2)     Status: None   Collection Time    07/26/13  8:34 PM      Result Value Range   Specimen Description Blood LEFT ANTECUBITAL     Special Requests BOTTLES DRAWN AEROBIC AND ANAEROBIC 6CC     Culture NO GROWTH 1 DAY     Report Status PENDING    URINALYSIS, ROUTINE W REFLEX MICROSCOPIC     Status: Abnormal   Collection Time    07/26/13  9:14 PM      Result Value Range   Color, Urine YELLOW  YELLOW   APPearance HAZY (*) CLEAR   Specific Gravity, Urine 1.010  1.005 - 1.030   pH 6.0  5.0 - 8.0   Glucose, UA NEGATIVE  NEGATIVE mg/dL   Hgb urine dipstick TRACE (*) NEGATIVE   Bilirubin Urine NEGATIVE  NEGATIVE   Ketones, ur NEGATIVE  NEGATIVE mg/dL   Protein, ur NEGATIVE  NEGATIVE mg/dL   Urobilinogen, UA 0.2  0.0 - 1.0 mg/dL   Nitrite POSITIVE (*) NEGATIVE   Leukocytes, UA NEGATIVE  NEGATIVE  URINE RAPID DRUG SCREEN (HOSP PERFORMED)     Status: Abnormal   Collection Time    07/26/13  9:14 PM      Result Value Range   Opiates POSITIVE (*) NONE DETECTED   Cocaine NONE DETECTED  NONE DETECTED   Benzodiazepines POSITIVE (*) NONE DETECTED   Amphetamines NONE DETECTED  NONE DETECTED   Tetrahydrocannabinol NONE DETECTED  NONE DETECTED   Barbiturates NONE DETECTED  NONE DETECTED   Comment:            DRUG SCREEN FOR MEDICAL PURPOSES     ONLY.  IF CONFIRMATION IS NEEDED     FOR ANY PURPOSE, NOTIFY LAB     WITHIN 5 DAYS.                LOWEST DETECTABLE LIMITS     FOR URINE DRUG SCREEN     Drug Class       Cutoff (ng/mL)     Amphetamine      1000     Barbiturate      200     Benzodiazepine   413     Tricyclics       244     Opiates          300     Cocaine          300     THC              50  URINE MICROSCOPIC-ADD ON     Status: Abnormal   Collection Time    07/26/13  9:14 PM      Result Value Range   WBC, UA 21-50  <3 WBC/hpf   Bacteria, UA MANY (*) RARE  TROPONIN I     Status: None   Collection Time    07/27/13   6:01 AM      Result Value Range   Troponin I <0.30  <0.30 ng/mL   Comment:            Due to the release kinetics of cTnI,     a negative result within the first hours     of the onset of symptoms does not rule out     myocardial infarction with certainty.     If myocardial infarction  is still suspected,     repeat the test at appropriate intervals.  COMPREHENSIVE METABOLIC PANEL     Status: Abnormal   Collection Time    07/27/13  6:01 AM      Result Value Range   Sodium 141  137 - 147 mEq/L   Comment: DELTA CHECK NOTED   Potassium 3.1 (*) 3.7 - 5.3 mEq/L   Chloride 106  96 - 112 mEq/L   CO2 18 (*) 19 - 32 mEq/L   Glucose, Bld 101 (*) 70 - 99 mg/dL   BUN 77 (*) 6 - 23 mg/dL   Creatinine, Ser 2.38 (*) 0.50 - 1.10 mg/dL   Calcium 9.1  8.4 - 10.5 mg/dL   Total Protein 6.0  6.0 - 8.3 g/dL   Albumin 2.3 (*) 3.5 - 5.2 g/dL   AST 12  0 - 37 U/L   ALT 8  0 - 35 U/L   Alkaline Phosphatase 77  39 - 117 U/L   Total Bilirubin 0.3  0.3 - 1.2 mg/dL   GFR calc non Af Amer 19 (*) >90 mL/min   GFR calc Af Amer 22 (*) >90 mL/min   Comment: (NOTE)     The eGFR has been calculated using the CKD EPI equation.     This calculation has not been validated in all clinical situations.     eGFR's persistently <90 mL/min signify possible Chronic Kidney     Disease.  CBC     Status: Abnormal   Collection Time    07/27/13  6:01 AM      Result Value Range   WBC 18.7 (*) 4.0 - 10.5 K/uL   RBC 3.01 (*) 3.87 - 5.11 MIL/uL   Hemoglobin 10.4 (*) 12.0 - 15.0 g/dL   HCT 29.0 (*) 36.0 - 46.0 %   MCV 96.3  78.0 - 100.0 fL   MCH 34.6 (*) 26.0 - 34.0 pg   MCHC 35.9  30.0 - 36.0 g/dL   RDW 16.3 (*) 11.5 - 15.5 %   Platelets 210  150 - 400 K/uL    Ct Head Wo Contrast  07/26/2013   CLINICAL DATA:  Dementia.  Altered mental status.  EXAM: CT HEAD WITHOUT CONTRAST  TECHNIQUE: Contiguous axial images were obtained from the base of the skull through the vertex without intravenous contrast.  COMPARISON:  Head CT  scan and 04/29/2013.  FINDINGS: Again seen are atrophy and chronic microvascular ischemic change. No evidence of acute abnormality including infarction, hemorrhage, mass lesion, mass effect, midline shift or abnormal extra axial fluid collection. Cavum septum pellucidum is noted. The calvarium is intact.  IMPRESSION: No acute finding. Atrophy and chronic microvascular ischemic change.   Electronically Signed   By: Inge Rise M.D.   On: 07/26/2013 21:59   Dg Chest Portable 1 View  07/26/2013   CLINICAL DATA:  Altered mental status  EXAM: PORTABLE CHEST - 1 VIEW  COMPARISON:  DG THORACIC SPINE dated 01/15/2013; DG CHEST 1V PORT dated 05/27/2012  FINDINGS: The heart size and mediastinal contours are within normal limits. Both lungs are clear. The visualized skeletal structures are unremarkable.  IMPRESSION: No active disease.   Electronically Signed   By: Kathreen Devoid   On: 07/26/2013 19:10    Review of Systems  Constitutional: Negative for fever and chills.  Respiratory: Positive for hemoptysis. Negative for sputum production and shortness of breath.   Cardiovascular: Negative for orthopnea and leg swelling.  Gastrointestinal: Negative for nausea, vomiting and diarrhea.  Neurological: Positive for weakness.   Blood pressure 116/49, pulse 70, temperature 99.5 F (37.5 C), temperature source Oral, resp. rate 18, height 5' 4.5" (1.638 m), weight 54.749 kg (120 lb 11.2 oz), SpO2 95.00%. Physical Exam  Constitutional: No distress.  Eyes: No scleral icterus.  Neck: No JVD present.  Cardiovascular: Normal rate and regular rhythm.   Murmur heard. Respiratory: She has no wheezes. She has no rales.  GI: She exhibits distension. There is no rebound.  Musculoskeletal: She exhibits no tenderness.  Neurological: She is alert.    Assessment/Plan:  problem #1 acute kidney injury superimposed on chronic. Her BUN and creatinine has increased from her baseline. This could be secondary to prerenal syndrome  versus ATN due to lack of water intake and Lasix.. Presently patient denies any nausea vomiting. Her appetite however is poor. Problem #2 chronic renal failure: Her baseline creatinine is about 1.4 with EGFR of 32 cc per minute which is stage III. Patient had acute kidney injury thousand 11 with creatinine as high as 5.36. According the patient she didn't receive any dialysis however she was advised to have a fistula which didn't much off. Since then her renal function seems to have improved progressively. At this moment etiology for her renal failure is not clear most likely obstructive. She has ultrasound which was done 2011 showed improvement of hydronephrosis and her left kidney was 11.2 cm. Problem #3 increased confusion: Etiology as this moment is not clear. Patient however has improved. She has history of underlying dementia. Problem #4 anemia Problem #5 history of hypokalemia :. This could be secondary to diuretics. Problem #6 history of hypertension her blood pressure seems to be reasonably controlled Problem #7 history of chronic systolic dysfunction: Presently she doesn't have any sign of fluid overload. Problem #8 history of right nephrectomy. Plan: Agree with hydration and the we'll increase her IV fluid to 125 cc per hour. Agree with her oral potassium supplement. We'll check her basic metabolic panel, phosphorus and intact PTH in the morning. Since patient has history of chronic failure we'll hold further workup unless her renal function continued to deteriorate.   Dayane Hillenburg S 07/27/2013, 10:42 AM

## 2013-07-27 NOTE — Evaluation (Signed)
Physical Therapy Evaluation Patient Details Name: [name] MRN: [id number] DOB: 02/05/1941 Today's Date: 07/27/2013 Time: [id number] PT Time Calculation (min): 20 min  PT Assessment / Plan / Recommendation History of Present Illness  Pt is admitted with a UTI with acute renal failure.  She has dementia and lives with her husband.  She apparently has a hx of falls and tells me that she has poor balance.  Clinical Impression   Pt is seen for evaluation and found to be close to functional baseline.  She does need a walker for maximal gait stability.    PT Assessment  Patent does not need any further PT services    Follow Up Recommendations  No PT follow up    Does the patient have the potential to tolerate intense rehabilitation      Barriers to Discharge        Equipment Recommendations  None recommended by PT    Recommendations for Other Services     Frequency      Precautions / Restrictions Precautions Precautions: Fall Restrictions Weight Bearing Restrictions: No   Pertinent Vitals/Pain       Mobility  Bed Mobility Overal bed mobility: Needs Assistance Bed Mobility: Supine to Sit;Sit to Supine Supine to sit: Supervision Sit to supine: Supervision General bed mobility comments: Supervision for safety/impulsivity Transfers Overall transfer level: Needs assistance Equipment used: Rolling walker (2 wheeled) Transfers: Sit to/from Stand Sit to Stand: Supervision General transfer comment: Pt requires minguard for safety/impulsivity Ambulation/Gait Ambulation/Gait assistance: Supervision Ambulation Distance (Feet): 200 Feet Assistive device: Rolling walker (2 wheeled) Gait velocity interpretation: at or above normal speed for age/gender General Gait Details: no gait instability    Exercises     PT Diagnosis:    PT Problem List:   PT Treatment Interventions:       PT Goals(Current goals can be found in the care plan section) Acute Rehab PT  Goals Patient Stated Goal: none stated PT Goal Formulation: No goals set, d/c therapy  Visit Information  Last PT Received On: 07/27/13 History of Present Illness: Pt is admitted with a UTI with acute renal failure.  She has dementia and lives with her husband.  She apparently has a hx of falls and tells me that she has poor balance.       Prior Functioning  Home Living Type of Home: House Home Access: Stairs to enter Entrance Stairs-Number of Steps: 1 Home Layout: Two level;Able to live on main level with bedroom/bathroom Additional Comments: All home date gathered only via pt report Prior Function Level of Independence: Needs assistance Comments: Per pt her husband assisted her with ADLs/IADLs as needed, but pt also reported that her husband has also been ill. Communication Communication: No difficulties Dominant Hand: Right    Cognition  Cognition Arousal/Alertness: Awake/alert Behavior During Therapy: WFL for tasks assessed/performed Overall Cognitive Status: History of cognitive impairments - at baseline (Per report, pt has dementia at baseline)    Extremity/Trunk Assessment Upper Extremity Assessment Upper Extremity Assessment: Generalized weakness;Overall [facility] for tasks assessed Lower Extremity Assessment Lower Extremity Assessment: Overall WFL for tasks assessed   Balance Balance Overall balance assessment: History of Falls  End of Session PT - End of Session Equipment Utilized During Treatment: Gait belt Activity Tolerance: Patient tolerated treatment well Patient left: in bed;with call bell/phone within reach;with bed alarm set Nurse Communication: Mobility status  GP     Melody PentaBrown, [name] 07/27/2013, 3:01 PM

## 2013-07-27 NOTE — Evaluation (Signed)
Occupational Therapy Evaluation Patient Details Name: [name] MRN: [id number] DOB: 1940/12/31 Today's Date: 07/27/2013 Time: 2951-8841 OT Time Calculation (min): 27 min Evaluation  27'  OT Assessment / Plan / Recommendation History of present illness [name] is a 72 y.o. female with a past medical history of dementia, chronic systolic heart failure with improved cardiac function in 2013, hypertension, chronic kidney disease, nephrectomy, bladder resection with right urostomy and ileal conduit, who lives with her husband. Patient has dementia at baseline, and information provided to OT this date for evaluation was provided solely via pt report.  According to nursing reports and ED physician notes, patient was brought in because she has had worsening in her confusion. She had been lying in the couch all day long without doing any activities. She also appeared to be more restless than normal. Patient takes medications for insomnia and it appears than 7 tablets were missing after accounting for her daily dosage.   Clinical Impression   Pt is 73 yo female presenting to acute OT. Per pt report she has been receiving assist with ADLs from her husband at baseline - pt also has hx of dementia at baseline. Pt is capable of assisting in her ADLS (pt requested to walk to bathroom and then emptied her urostomy bag), but requires assist for safety and hygiene, secondary to her impulsiveness and decreased safety awareness. Recommend skilled [place] for safety in the home, if 24-hour supervision is available; if not, SNF. Pt requires no further acute OT at this time.    OT Assessment  All further OT needs can be met in the next venue of care    Follow Up Recommendations  Home health OT;SNF;Supervision/Assistance - 24 hour                Precautions / Restrictions Precautions Precautions: Fall Restrictions Weight Bearing Restrictions: No       ADL  Grooming: Wash/dry hands;Teeth  care;Minimal assistance;Use of adaptive equipment (minA for opening packages, walker for balance in standing) Where Assessed - Grooming: Supported standing Lower Body Dressing: Set up;Min guard Where Assessed - Lower Body Dressing: Unsupported sitting;Supported standing;Supported sit to stand Toilet Transfer: Minimal assistance Toilet Transfer Method: Sit to stand Toilet Transfer Equipment: Regular height toilet;Grab bars Toileting - Clothing Manipulation and Hygiene: Minimal assistance (Pt required assist for holding gown and telemetry. Pt managed urostomy bag. Pt required cueing to wash hands after toileting.) Where Assessed - Toileting Clothing Manipulation and Hygiene: Standing Equipment Used: Rolling walker Transfers/Ambulation Related to ADLs: Pt ambulated with min guard, but required extra vigilience for safety and impulsivenss. ADL Comments: Pt is capable of completing ADL tasks, but requires assist for safety. Pt has impulsive tendencies during ADL tasks.    OT Diagnosis: Cognitive deficits  OT Problem List: Decreased safety awareness;Decreased cognition  OT Goals(Current goals can be found in the care plan section) Acute Rehab OT Goals Patient Stated Goal: none stated  Visit Information  Last OT Received On: 07/27/13 Assistance Needed: +1 History of Present Illness: [name] is a 73 y.o. female with a past medical history of dementia, chronic systolic heart failure with improved cardiac function in 2013, hypertension, chronic kidney disease, nephrectomy, bladder resection with right urostomy and ileal conduit, who lives with her husband. Patient has dementia at baseline, and information provided to OT this date for evaluation was provided solely via pt report.  According to nursing reports and ED physician notes, patient was brought in because she has had worsening  in her confusion. She had been lying in the couch all day long without doing any activities. She also appeared  to be more restless than normal. Patient takes medications for insomnia and it appears than 7 tablets were missing after accounting for her daily dosage.       Prior Functioning     Home Living Type of Home: House Home Access: Stairs to enter Entrance Stairs-Number of Steps: 1 Home Layout: Two level;Able to live on main level with bedroom/bathroom Additional Comments: All home date gathered only via pt report Prior Function Level of Independence: Needs assistance Comments: Per pt her husband assisted her with ADLs/IADLs as needed, but pt also reported that her husband has also been ill. Communication Communication: No difficulties Dominant Hand: Right     Vision/Perception Vision - History Baseline Vision: Wears glasses only for reading   Cognition  Cognition Arousal/Alertness: Awake/alert Behavior During Therapy: WFL for tasks assessed/performed Overall Cognitive Status: History of cognitive impairments - at baseline (Per report, pt has dementia at baseline)    Extremity/Trunk Assessment Upper Extremity Assessment Upper Extremity Assessment: Generalized weakness;Overall [facility] for tasks assessed Lower Extremity Assessment Lower Extremity Assessment: Defer to PT evaluation     Mobility Bed Mobility Overal bed mobility: Needs Assistance Bed Mobility: Supine to Sit;Sit to Supine Supine to sit: Supervision Sit to supine: Supervision General bed mobility comments: Supervision for safety/impulsivity Transfers Overall transfer level: Needs assistance Equipment used: Rolling walker (2 wheeled) Transfers: Sit to/from Stand Sit to Stand: Supervision;Min guard General transfer comment: Pt requires minguard for safety/impulsivity           End of Session OT - End of Session Equipment Utilized During Treatment: Gait belt;Rolling walker Activity Tolerance: Patient tolerated treatment well Patient left: in bed;with call bell/phone within reach;with bed alarm set      Bea Graff, [place], OTR/L [phone]  07/27/2013, 12:29 PM

## 2013-07-27 NOTE — ED Notes (Signed)
Dr. Lovell SheehanJenkins approved use of femoral line to start IV fluids.

## 2013-07-27 NOTE — Progress Notes (Signed)
[facility] PROGRESS NOTE  York CeriseBetty F Araque ZHY:[id number] DOB: 11/06/1940 DOA: 07/26/2013 PCP: Malissa HippoEHMAN,NAJEEB U, MD  Assessment/Plan: #1 acute renal failure on chronic kidney disease, stage III: Usual baseline creatinine, appears to be around 1.5-1.6. BUN is also significantly elevated. BUN and creatinine trending down this am. Continue IV fluids. Hold diuretics. Volume status -700cc.  Echocardiogram from 2013 showed improvement in her systolic function. Urine output good with clear golden urine.  Monitor urine output closely.   #2 urinary tract infection with leukocytosis in the setting of urostomy and ileal conduit: Leukocytosis trending down. Max temp 99.  Urine cultures pending. Continue IV Rocephin   #3 acute encephalopathy: Improved this am.  Likely multifactorial i.e. Infectious process in setting of medications. CT head without any acute changes. Continue to  hold all of her psychotropic agents. Hold any other medication that may cause impairment of her mental status.   #4 history of chronic systolic heart failure: This appears to have improved based on echocardiogram from 2013. Continue with beta blocker at lower dose than normal due to her borderline low blood pressure. Holding parameters. Monitor volume status closely. EKG was noted to be abnormal. Repeat EKG this morning NSR without acute changes. Troponin is reassuringly normal. Patient denies chest pain. Continue to monitor.   #5 hypokalemia: will replete and recheck. Will not be too aggressive due to her renal failure.    #6 history of hypertension:SBP range 103-116. Holding her amlodipine and hydralazine for now.   A left femoral vein central line was placed by Dr. Lovell SheehanJenkins due to poor venous access.      Code Status: full Family Communication: none present Disposition Plan: home when ready hopefully 24-48 hours   Consultants:  none  Procedures:  none  Antibiotics:  Rocephin  07/26/13>>  HPI/Subjective: Sitting up in bed eating. Slightly lethargic. Reports low back pain.   Objective: Filed Vitals:   07/27/13 0448  BP:   Pulse: 71  Temp:   Resp: 20    Intake/Output Summary (Last 24 hours) at 07/27/13 [id number] Last data filed at 07/27/13 0615  Gross per 24 hour  Intake 273.33 ml  Output    450 ml  Net -176.67 ml   Filed Weights   07/27/13 0300  Weight: 54.749 kg (120 lb 11.2 oz)    Exam:   General:  Appears stated age. NAD  Cardiovascular: RRR No MGR No LE edema  Respiratory: normal effort BS clear bilaterally no wheeze no rhonchi  Abdomen: soft +BS urostomy bag intact with clear gold urine. Scars from pervious surgery well healed. Diffuse mild tenderness. No gurading  Musculoskeletal: no clubbing or cyanosis   Data Reviewed: Basic Metabolic Panel:  Recent Labs Lab 07/26/13 2033 07/27/13 0601  NA 133* 141  K 2.8* 3.1*  CL 96 106  CO2 20 18*  GLUCOSE 116* 101*  BUN 83* 77*  CREATININE 2.69* 2.38*  CALCIUM 10.2 9.1   Liver Function Tests:  Recent Labs Lab 07/26/13 2033 07/27/13 0601  AST 12 12  ALT 10 8  ALKPHOS 62 77  BILITOT 0.3 0.3  PROT 6.9 6.0  ALBUMIN 2.7* 2.3*   No results found for this basename: LIPASE, AMYLASE,  in the last 168 hours No results found for this basename: AMMONIA,  in the last 168 hours CBC:  Recent Labs Lab 07/26/13 2033 07/27/13 0601  WBC 22.2* 18.7*  NEUTROABS 18.7*  --   HGB 11.8* 10.4*  HCT 33.4* 29.0*  MCV 96.0 96.3  PLT 232 210  Cardiac Enzymes:  Recent Labs Lab 07/26/13 2033 07/27/13 0601  CKTOTAL 41  --   TROPONINI <0.30 <0.30   BNP (last 3 results) No results found for this basename: PROBNP,  in the last 8760 hours CBG: No results found for this basename: GLUCAP,  in the last 168 hours  Recent Results (from the past 240 hour(s))  CULTURE, BLOOD (ROUTINE X 2)     Status: None   Collection Time    07/26/13  8:00 PM      Result Value Range Status   Specimen  Description Blood BLOOD LEFT ARM   Final   Special Requests BOTTLES DRAWN AEROBIC ONLY 5CC   Final   Culture PENDING   Incomplete   Report Status PENDING   Incomplete  CULTURE, BLOOD (ROUTINE X 2)     Status: None   Collection Time    07/26/13  8:34 PM      Result Value Range Status   Specimen Description Blood LEFT ANTECUBITAL   Final   Special Requests BOTTLES DRAWN AEROBIC AND ANAEROBIC [facility]   Final   Culture PENDING   Incomplete   Report Status PENDING   Incomplete     Studies: Ct Head Wo Contrast  07/26/2013   CLINICAL DATA:  Dementia.  Altered mental status.  EXAM: CT HEAD WITHOUT CONTRAST  TECHNIQUE: Contiguous axial images were obtained from the base of the skull through the vertex without intravenous contrast.  COMPARISON:  Head CT scan and 04/29/2013.  FINDINGS: Again seen are atrophy and chronic microvascular ischemic change. No evidence of acute abnormality including infarction, hemorrhage, mass lesion, mass effect, midline shift or abnormal extra axial fluid collection. Cavum septum pellucidum is noted. The calvarium is intact.  IMPRESSION: No acute finding. Atrophy and chronic microvascular ischemic change.   Electronically Signed   By: Drusilla Kanner M.D.   On: 07/26/2013 21:59   Dg Chest Portable 1 View  07/26/2013   CLINICAL DATA:  Altered mental status  EXAM: PORTABLE CHEST - 1 VIEW  COMPARISON:  DG THORACIC SPINE dated 01/15/2013; DG CHEST 1V PORT dated 05/27/2012  FINDINGS: The heart size and mediastinal contours are within normal limits. Both lungs are clear. The visualized skeletal structures are unremarkable.  IMPRESSION: No active disease.   Electronically Signed   By: Elige Ko   On: 07/26/2013 19:10    Scheduled Meds: . aspirin EC  81 mg Oral Daily  . calcitRIOL  0.5 mcg Oral Daily  . carvedilol  6.25 mg Oral BID WC  . cefTRIAXone (ROCEPHIN)  IV  1 g Intravenous Q24H  . docusate sodium  100 mg Oral BID  . heparin  5,000 Units Subcutaneous Q8H  . potassium  chloride  40 mEq Oral Q4H  . sodium bicarbonate  1,300 mg Oral BID  . sodium chloride  3 mL Intravenous Q12H   Continuous Infusions: . sodium chloride 100 mL/hr at 07/27/13 1610    Principal Problem:   Acute on chronic renal insufficiency Active Problems:   Dementia   UTI (lower urinary tract infection)   Hypokalemia   Encephalopathy, toxic   Abdominal pain, chronic, left lower quadrant   Leukocytosis    Time spent: 30 minutes    [facility] M  [facility] Pager [phone]. If 7PM-7AM, please contact night-coverage at [email or web address], password [facility] 07/27/2013, 9:03 AM  LOS: 1 day    I have personally examined the patient and reviewed the entire database. Will continue IV fluids, follow urine culture, continue Rocephin. Agree  with the above note and plan as outlined, any necessary changes made.  Taletha Twiford M.D. [facility] 07/27/2013, 1:57 PM Pager: [phone]  If 7PM-7AM, please contact night-coverage [email or web address] Password [email or web address]

## 2013-07-27 NOTE — Progress Notes (Signed)
SLP Cancellation Note  Patient Details Name: [name] MRN: [id number] DOB: 03/23/1941   Cancelled treatment:       Reason Eval/Treat Not Completed: Patient at procedure or test/unavailable Pt working with PT. SLP with try again tomorrow AM.   PORTER,DABNEY 07/27/2013, 3:17 PM

## 2013-07-27 NOTE — Procedures (Signed)
Central Venous Catheter Insertion Procedure Note Melody CeriseBetty F [name] [id number] 09/23/1940  Procedure: Insertion of Central Venous Catheter Indications: Assessment of intravascular volume, Drug and/or fluid administration and Frequent blood sampling  Procedure Details Consent: Risks of procedure as well as the alternatives and risks of each were explained to the (patient/caregiver).  Consent for procedure obtained. Time Out: Verified patient identification, verified procedure, site/side was marked, verified correct patient position, special equipment/implants available, medications/allergies/relevent history reviewed, required imaging and test results available.  Performed  Maximum sterile technique was used including antiseptics, gloves, hand hygiene and sheet. Skin prep: Chlorhexidine; local anesthetic administered A antimicrobial bonded/coated triple lumen catheter was placed in the left femoral vein due to multiple attempts, no other available access using the Seldinger technique.  Evaluation Blood flow good Complications: No apparent complications Patient did tolerate procedure well.   [name] A 07/27/2013, 12:57 AM

## 2013-07-27 NOTE — ED Notes (Addendum)
Called to update patients daughter [name]  Ph# [phone]  Who states she is POA and HCPOA on patient condition.  Advised will be moving to room #330 shortly.  Femoral line in place, fluids infusing, patient slightly less restless and appears to be more alert and communicative than upon her arrival.

## 2013-07-27 NOTE — ED Notes (Signed)
Four more attempts to obtain IV access , unsuccessful..  Patient remains confused, restless

## 2013-07-28 DIAGNOSIS — R5381 Other malaise: Secondary | ICD-10-CM

## 2013-07-28 DIAGNOSIS — N179 Acute kidney failure, unspecified: Secondary | ICD-10-CM

## 2013-07-28 DIAGNOSIS — R5383 Other fatigue: Secondary | ICD-10-CM

## 2013-07-28 LAB — BASIC METABOLIC PANEL
BUN: 54 mg/dL — ABNORMAL HIGH (ref 6–23)
CHLORIDE: 112 meq/L (ref 96–112)
CO2: 17 meq/L — AB (ref 19–32)
CREATININE: 1.9 mg/dL — AB (ref 0.50–1.10)
Calcium: 8.4 mg/dL (ref 8.4–10.5)
GFR calc non Af Amer: 25 mL/min — ABNORMAL LOW (ref >90)
GFR, EST AFRICAN AMERICAN: 29 mL/min — AB (ref >90)
Glucose, Bld: 100 mg/dL — ABNORMAL HIGH (ref 70–99)
POTASSIUM: 4 meq/L (ref 3.7–5.3)
SODIUM: 142 meq/L (ref 137–147)

## 2013-07-28 LAB — CBC
HEMATOCRIT: 27.9 % — AB (ref 36.0–46.0)
HEMOGLOBIN: 9.6 g/dL — AB (ref 12.0–15.0)
MCH: 33.8 pg (ref 26.0–34.0)
MCHC: 34.4 g/dL (ref 30.0–36.0)
MCV: 98.2 fL (ref 78.0–100.0)
Platelets: 205 10*3/uL (ref 150–400)
RBC: 2.84 MIL/uL — AB (ref 3.87–5.11)
RDW: 17.1 % — ABNORMAL HIGH (ref 11.5–15.5)
WBC: 16 10*3/uL — ABNORMAL HIGH (ref 4.0–10.5)

## 2013-07-28 LAB — PTH, INTACT AND CALCIUM
CALCIUM TOTAL (PTH): 9.1 mg/dL (ref 8.4–10.5)
PTH: 11.4 pg/mL — ABNORMAL LOW (ref 14.0–72.0)

## 2013-07-28 LAB — VITAMIN D 25 HYDROXY (VIT D DEFICIENCY, FRACTURES): Vit D, 25-Hydroxy: 31 ng/mL (ref 30–89)

## 2013-07-28 LAB — PHOSPHORUS: Phosphorus: 1.8 mg/dL — ABNORMAL LOW (ref 2.3–4.6)

## 2013-07-28 MED ORDER — K PHOS MONO-SOD PHOS DI & MONO 155-852-130 MG PO TABS
500.0000 mg | ORAL_TABLET | Freq: Two times a day (BID) | ORAL | Status: DC
  Administered 2013-07-28 – 2013-07-29 (×3): 500 mg via ORAL
  Filled 2013-07-28 (×7): qty 2

## 2013-07-28 MED ORDER — K PHOS MONO-SOD PHOS DI & MONO 155-852-130 MG PO TABS: 500.0000 mg | ORAL_TABLET | Freq: Two times a day (BID) | ORAL | Status: DC

## 2013-07-28 MED ORDER — SODIUM BICARBONATE 650 MG PO TABS
1300.0000 mg | ORAL_TABLET | Freq: Three times a day (TID) | ORAL | Status: DC
  Administered 2013-07-28 – 2013-07-29 (×4): 1300 mg via ORAL
  Filled 2013-07-28 (×4): qty 2

## 2013-07-28 MED ORDER — ALPRAZOLAM 0.5 MG PO TABS: 0.5000 mg | ORAL_TABLET | Freq: Two times a day (BID) | ORAL | Status: DC | PRN

## 2013-07-28 NOTE — Evaluation (Signed)
Clinical/Bedside Swallow Evaluation Patient Details  Name: [name] MRN: [id number] Date of Birth: 1941-02-18  Today's Date: 07/28/2013 Time: 1020-1050 SLP Time Calculation (min): 30 min  Past Medical History:  Past Medical History  Diagnosis Date  . Chronic kidney disease   . Gastroesophageal reflux   . Chronic UTI   . Hydronephrosis     She has chronic mild left   . AV fistula     clotted in her left forearm  . Cellulitis   . Pneumothorax     secondary to central line placement  . Single kidney     post right nephrectomy  . Myocardial infarction 2011; 2012  . Numbness and tingling of both legs   . Anginal pain   . Heart murmur   . Dysrhythmia     "heart beats real fast"  . Chronic anemia   . Pneumonia     "one time"  . Chronic chest pain 01/14/12    1-2 X/day/H&P  . Arthritis     "at different places"  . Anxiety   . Depression   . Headache(784.0)     "alot; not daily"  . Frequent falls   . Dementia   . Collagen vascular disease   . Renal insufficiency   . S/P ileal conduit     post cystectomy/ileal conduit  . Anemia     Received [facility] Feraheme [facility] 2 [facility]  . Iron deficiency     Received IV Feraheme (x) 2 at [facility]  . Dysarthria 03/31/13    Speech is almost a little bit dysarthritic  . Unsteady     Stronger since she came out of nursing home & is walking with a cane  . Edema 03/31/13    Gained 9 pounds overnight & telephone nurse instructed her to take 20mg  of Lasix bid on 03/30/13. Still has swelling in legs.  . Metabolic acidosis     Chronic  . Hypertension     Good control  . CHF (congestive heart failure)     Systolic heart failure EF 25-30% by ECHO 2012  . Altered mental state     Recurrent episodes, with confusion variously related to narcotic use, infection, etc. and has required hospitilization, nursing home stays for rehab related to weakness, encephalopathy, etc.  . History of sepsis 2012    With E.Coli bacteremia  . E coli  bacteremia 2012    History of sepsis & E.Coli bacteremia  . Secondary hyperparathyroidism     Dr. Caryn Section had a sestamibi scan of her neck in May 2014 which was negative for parathyroid activity in her neck.   Past Surgical History:  Past Surgical History  Procedure Laterality Date  . Hip arthroplasty      left; S/P fracture  . Rod left leg  2012    left hip "to almost my knee; for my hip"  . Creation / revision of ileostomy / jejunostomy  1973  . Nephrectomy  1973    right  . Fracture surgery    . Partial colectomy  1970  . Tonsillectomy and adenoidectomy      "when I was a kid"  . Cholecystectomy  2000  . Appendectomy  2000  . Abdominal hysterectomy    . Dilation and curettage of uterus    . Cesarean section  1969    only 4th of 4 children was c-section  . Av fistula placement  1970's    LFA  . Bladder removal  1974  with ileal conduit and urostomy   HPI:  [name] is a 73 y.o. female with a past medical history of dementia, chronic systolic heart failure with improved cardiac function in 2013, hypertension, chronic kidney disease, nephrectomy, bladder resection with right urostomy and ileal conduit, who lives with her husband. Patient has dementia at baseline and is confused at this time. According to nursing reports and ED physician notes, patient was brought in because she has had worsening in her confusion. She had been lying in the couch all day long without doing any activities. She also appeared to be more restless than normal. Patient takes medications for insomnia and it appears than 7 tablets were missing after accounting for her daily dosage. Patient is unable to tell us if she took more tablets than she should have. She was also found to have a low-grade temperature in the emergency department and had elevated WBC and abnormal UA. Workup revealed that she had acute on chronic renal failure and we were called for further management.   Assessment / Plan /  Recommendation Clinical Impression  The Oral Motor Exam was within functional limits. The pt self administered and tolerated all textures and consistencies without s/s of aspiration. Recommend upgrade to regular textures and thin liquids with standard aspiration and reflux precautions. Pt encouraged to drink more fluids as she was admitted with dehydration and husband reports minimal water intake at home. SLP will follow up x1 for diet tolerance.    Aspiration Risk  Mild    Diet Recommendation Thin liquid;Regular   Liquid Administration via: Cup;Straw Medication Administration: Whole meds with liquid Supervision: Patient able to self feed Compensations: Follow solids with liquid Postural Changes and/or Swallow Maneuvers: Seated upright 90 degrees;Upright 30-60 min after meal    Other  Recommendations Oral Care Recommendations: Oral care BID Other Recommendations: Clarify dietary restrictions   Follow Up Recommendations  None    Frequency and Duration min 1 x/week  1 week         Swallow Study Prior Functional Status   Lives at home with husband and consumes regular diet and thin liquids.    General Date of Onset: 07/26/13 HPI: [name] is a 73 y.o. female with a past medical history of dementia, chronic systolic heart failure with improved cardiac function in 2013, hypertension, chronic kidney disease, nephrectomy, bladder resection with right urostomy and ileal conduit, who lives with her husband. Patient has dementia at baseline and is confused at this time. According to nursing reports and ED physician notes, patient was brought in because she has had worsening in her confusion. She had been lying in the couch all day long without doing any activities. She also appeared to be more restless than normal. Patient takes medications for insomnia and it appears than 7 tablets were missing after accounting for her daily dosage. Patient is unable to tell us if she took more tablets  than she should have. She was also found to have a low-grade temperature in the emergency department and had elevated WBC and abnormal UA. Workup revealed that she had acute on chronic renal failure and we were called for further management. Type of Study: Bedside swallow evaluation Previous Swallow Assessment: none Diet Prior to this Study: Thin liquids;Dysphagia 2 (chopped) Respiratory Status: Room air History of Recent Intubation: No Behavior/Cognition: Alert;Cooperative;Pleasant mood Oral Cavity - Dentition:  (Partials missing some dentition) Self-Feeding Abilities: Able to feed self Patient Positioning: Upright in bed Baseline Vocal Quality: Clear Volitional Cough:  Strong Volitional Swallow: Able to elicit    Oral/Motor/Sensory Function Overall Oral Motor/Sensory Function: Appears within functional limits for tasks assessed Labial ROM: Within Functional Limits Labial Symmetry: Within Functional Limits Labial Strength: Within Functional Limits Labial Sensation: Within Functional Limits Lingual ROM: Within Functional Limits Lingual Symmetry: Within Functional Limits Lingual Strength: Within Functional Limits Lingual Sensation: Within Functional Limits Facial ROM: Within Functional Limits Facial Symmetry: Within Functional Limits Facial Strength: Within Functional Limits Facial Sensation: Within Functional Limits Velum: Within Functional Limits Mandible: Within Functional Limits   Ice Chips Ice chips: Within functional limits Presentation: Self Fed;Cup   Thin Liquid Thin Liquid: Within functional limits Presentation: Straw;Self Fed    Nectar Thick Nectar Thick Liquid: Not tested   Honey Thick Honey Thick Liquid: Not tested   Puree Puree: Within functional limits Presentation: Self Fed;Spoon   Solid    Thank you, [facility] Melodee Lupe, SLP-Student [phone]  Solid: Within functional limits Presentation: Self Fed       Jeb Schloemer 07/28/2013,11:40 AM

## 2013-07-28 NOTE — Progress Notes (Addendum)
Subjective: Interval History: has no complaint of nausea or vomiting. She states that she's feeling much better. She denies any difficulty in breathing..  Objective: Vital signs in last 24 hours: Temp:  [98.1 F (36.7 C)-99.9 F (37.7 C)] 99.9 F (37.7 C) (02/03 0648) Pulse Rate:  [70-81] 75 (02/03 0648) Resp:  [18] 18 (02/03 0648) BP: (115-122)/(60-69) 115/66 mmHg (02/03 0648) SpO2:  [94 %-98 %] 96 % (02/03 0648) Weight change:   Intake/Output from previous day: 02/02 0701 - 02/03 0700 In: 360 [P.O.:360] Out: 350 [Urine:350] Intake/Output this shift:    General appearance: alert, cooperative and no distress Resp: clear to auscultation bilaterally Cardio: regular rate and rhythm, S1, S2 normal, no murmur, click, rub or gallop GI: soft, non-tender; bowel sounds normal; no masses,  no organomegaly Extremities: extremities normal, atraumatic, no cyanosis or edema  Lab Results:  Recent Labs  07/27/13 0601 07/28/13 0612  WBC 18.7* 16.0*  HGB 10.4* 9.6*  HCT 29.0* 27.9*  PLT 210 205   BMET:  Recent Labs  07/27/13 0601 07/28/13 0612  NA 141 142  K 3.1* 4.0  CL 106 112  CO2 18* 17*  GLUCOSE 101* 100*  BUN 77* 54*  CREATININE 2.38* 1.90*  CALCIUM 9.1 8.4   No results found for this basename: PTH,  in the last 72 hours Iron Studies: No results found for this basename: IRON, TIBC, TRANSFERRIN, FERRITIN,  in the last 72 hours  Studies/Results: Ct Head Wo Contrast  07/26/2013   CLINICAL DATA:  Dementia.  Altered mental status.  EXAM: CT HEAD WITHOUT CONTRAST  TECHNIQUE: Contiguous axial images were obtained from the base of the skull through the vertex without intravenous contrast.  COMPARISON:  Head CT scan and 04/29/2013.  FINDINGS: Again seen are atrophy and chronic microvascular ischemic change. No evidence of acute abnormality including infarction, hemorrhage, mass lesion, mass effect, midline shift or abnormal extra axial fluid collection. Cavum septum pellucidum is  noted. The calvarium is intact.  IMPRESSION: No acute finding. Atrophy and chronic microvascular ischemic change.   Electronically Signed   By: Drusilla Kanner M.D.   On: 07/26/2013 21:59   Dg Chest Portable 1 View  07/26/2013   CLINICAL DATA:  Altered mental status  EXAM: PORTABLE CHEST - 1 VIEW  COMPARISON:  DG THORACIC SPINE dated 01/15/2013; DG CHEST 1V PORT dated 05/27/2012  FINDINGS: The heart size and mediastinal contours are within normal limits. Both lungs are clear. The visualized skeletal structures are unremarkable.  IMPRESSION: No active disease.   Electronically Signed   By: Elige Ko   On: 07/26/2013 19:10    I have reviewed the patient's current medications.  Assessment/Plan: Problem #1 acute kidney injury: Her BUN is 54 and creatinine is 1.90. Her renal function has this moment seems to be improving. Problem #2 hypokalemia: Her potassium has corrected Problem #3 low CO2 possibly metabolic he CO2 17 declining Problem #4 altered mental status has improved Problem #5 history of UTI: Patient her febrile and her white blood cell count is improving. 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 Problem #9 hypophosphatemia: Possibly secondary to poor nutrition and phosphorus loss. Plan: We'll increase her  sodium bicarbonate 650 mg 2 tablets 3 times  a day Will start patient on K-Phos Neutral one tablet by mouth twice a day for 2 days We'll check her phosphorus in the morning.  We'll continue his IV fluid We'll check her basic metabolic panel in the morning.    LOS: 2 days   Portia Wisdom S 07/28/2013,7:22 AM

## 2013-07-28 NOTE — Progress Notes (Signed)
[facility] PROGRESS NOTE  [name]:[id number] DOB: Jun 20, 1941 DOA: 07/26/2013 PCP: Malissa Hippo, MD  Assessment/Plan: #1 acute renal failure on chronic kidney disease, stage III: Usual baseline creatinine, appears to be around 1.5-1.6. BUN is also significantly elevated. BUN and creatinine continue to trend down this am. Continue IV fluids per nephrology. Holding diuretics. Volume status -166cc. Echocardiogram from 2013 showed improvement in her systolic function. Urine output good with clear golden urine.    #2 urinary tract infection with leukocytosis in the setting of urostomy and ileal conduit: Leukocytosis trending down. Max temp 99.9 Urine cultures pending. Continue IV Rocephin day #3.   #3 acute encephalopathy: Resolved. Likely multifactorial i.e. Infectious process in setting of medications. CT head without any acute changes. Continue to hold all of her psychotropic agents. Hold any other medication that may cause impairment of her mental status.   #4 history of chronic systolic heart failure: This appears to have improved based on echocardiogram from 2013. Continue with beta blocker at lower dose than normal due to her borderline low blood pressure. Holding parameters. Monitor volume status closely. EKG was noted to be abnormal. Repeat EKG this morning NSR without acute changes. Troponin is reassuringly normal. Patient denies chest pain. Continue to monitor.   #5 hypokalemia: resolved.  Will not be too aggressive due to her renal failure.  #6 history of hypertension:SBP range 116-122. Holding her amlodipine and hydralazine for now.   #metabolic acidosis: related to renal function. Sodium bicarb increased per renal  A left femoral vein central line was placed by Dr. Lovell Sheehan due to poor venous access.     Code Status:  full Family Communication: none present Disposition Plan: home hopefully tomorrow   Consultants:  Dr. Kristian Covey  nephrology  Procedures:  none  Antibiotics:  Rocephin 07/26/13>>  HPI/Subjective: Awake alert. Denies pain/discomfort. Reports feeling better  Objective: Filed Vitals:   07/28/13 0648  BP: 115/66  Pulse: 75  Temp: 99.9 F (37.7 C)  Resp: 18    Intake/Output Summary (Last 24 hours) at 07/28/13 0924 Last data filed at 07/27/13 1700  Gross per 24 hour  Intake    240 ml  Output    350 ml  Net   -110 ml   Filed Weights   07/27/13 0300  Weight: 54.749 kg (120 lb 11.2 oz)    Exam:   General:  Well nourished NAD  Cardiovascular: RRR No MGR No LE edema  Respiratory: normal effort BS clear bilaterally no wheeze no rhonchi  Abdomen: non-distended. Urostomy bag intact with clear urine. Non-tender with +BS   Musculoskeletal: no clubbing or cyanosis   Data Reviewed: Basic Metabolic Panel:  Recent Labs Lab 07/26/13 2033 07/27/13 0601 07/28/13 0612  NA 133* 141 142  K 2.8* 3.1* 4.0  CL 96 106 112  CO2 20 18* 17*  GLUCOSE 116* 101* 100*  BUN 83* 77* 54*  CREATININE 2.69* 2.38* 1.90*  CALCIUM 10.2 9.1 8.4  PHOS  --   --  1.8*   Liver Function Tests:  Recent Labs Lab 07/26/13 2033 07/27/13 0601  AST 12 12  ALT 10 8  ALKPHOS 62 77  BILITOT 0.3 0.3  PROT 6.9 6.0  ALBUMIN 2.7* 2.3*   No results found for this basename: LIPASE, AMYLASE,  in the last 168 hours No results found for this basename: AMMONIA,  in the last 168 hours CBC:  Recent Labs Lab 07/26/13 2033 07/27/13 0601 07/28/13 0612  WBC 22.2* 18.7* 16.0*  NEUTROABS 18.7*  --   --  HGB 11.8* 10.4* 9.6*  HCT 33.4* 29.0* 27.9*  MCV 96.0 96.3 98.2  PLT 232 210 205   Cardiac Enzymes:  Recent Labs Lab 07/26/13 2033 07/27/13 0601 07/27/13 1052  CKTOTAL 41  --   --   TROPONINI <0.30 <0.30 <0.30   BNP (last 3 results) No results found for this basename: PROBNP,  in the last 8760 hours CBG: No results found for this basename: GLUCAP,  in the last 168 hours  Recent Results (from the  past 240 hour(s))  CULTURE, BLOOD (ROUTINE X 2)     Status: None   Collection Time    07/26/13  8:00 PM      Result Value Range Status   Specimen Description Blood BLOOD LEFT ARM   Final   Special Requests BOTTLES DRAWN AEROBIC ONLY 5CC   Final   Culture NO GROWTH 1 DAY   Final   Report Status PENDING   Incomplete  CULTURE, BLOOD (ROUTINE X 2)     Status: None   Collection Time    07/26/13  8:34 PM      Result Value Range Status   Specimen Description Blood LEFT ANTECUBITAL   Final   Special Requests BOTTLES DRAWN AEROBIC AND ANAEROBIC 6CC   Final   Culture NO GROWTH 1 DAY   Final   Report Status PENDING   Incomplete  URINE CULTURE     Status: None   Collection Time    07/26/13  9:14 PM      Result Value Range Status   Specimen Description URINE, CATHETERIZED ILEAL LOOP UROSTOMY   Final   Special Requests NONE   Final   Culture  Setup Time     Final   Value: 07/26/2013 22:00     [facility]formed [facility]   Colony Count PENDING   Incomplete   Culture     Final   Value: Culture reincubated for better growth     [facility]formed at [facility]   Report Status PENDING   Incomplete     Studies: Ct Head Wo Contrast  07/26/2013   CLINICAL DATA:  Dementia.  Altered mental status.  EXAM: CT HEAD WITHOUT CONTRAST  TECHNIQUE: Contiguous axial images were obtained from the base of the skull through the vertex without intravenous contrast.  COMPARISON:  Head CT scan and 04/29/2013.  FINDINGS: Again seen are atrophy and chronic microvascular ischemic change. No evidence of acute abnormality including infarction, hemorrhage, mass lesion, mass effect, midline shift or abnormal extra axial fluid collection. Cavum septum pellucidum is noted. The calvarium is intact.  IMPRESSION: No acute finding. Atrophy and chronic microvascular ischemic change.   Electronically Signed   By: Drusilla Kannerhomas  Dalessio M.D.   On: 07/26/2013 21:59   Dg Chest Portable 1 View  07/26/2013   CLINICAL DATA:  Altered  mental status  EXAM: PORTABLE CHEST - 1 VIEW  COMPARISON:  DG THORACIC SPINE dated 01/15/2013; DG CHEST 1V PORT dated 05/27/2012  FINDINGS: The heart size and mediastinal contours are within normal limits. Both lungs are clear. The visualized skeletal structures are unremarkable.  IMPRESSION: No active disease.   Electronically Signed   By: Elige KoHetal  Patel   On: 07/26/2013 19:10    Scheduled Meds: . aspirin EC  81 mg Oral Daily  . calcitRIOL  0.5 mcg Oral Daily  . carvedilol  6.25 mg Oral BID WC  . cefTRIAXone (ROCEPHIN)  IV  1 g Intravenous Q24H  . docusate sodium  100 mg  Oral BID  . heparin  5,000 Units Subcutaneous Q8H  . phosphorus  500 mg Oral BID  . sodium bicarbonate  1,300 mg Oral TID  . sodium chloride  3 mL Intravenous Q12H   Continuous Infusions: . sodium chloride 125 mL/hr at 07/28/13 1610    Principal Problem:   Acute on chronic renal insufficiency Active Problems:   Dementia   UTI (lower urinary tract infection)   Hypokalemia   Encephalopathy, toxic   Abdominal pain, chronic, left lower quadrant   Leukocytosis    Time spent: 30 minutes   [facility] M  [facility] Pager [phone]. If 7PM-7AM, please contact night-coverage at [email or web address], password [facility] 07/28/2013, 9:24 AM  LOS: 2 days    I have personally examined the patient and reviewed the entire database.  Somewhat improving today, creatinine function improving to 1.9, nephrology following  Mental status is also improving, confirmed with her husband at the bedside, still not at baseline.  White count trending down, lowgrade temp, continue IV Rocephin, follow urine and bld cultures.  Agree with the above note and plan as outlined by Ms [facility], any necessary changes made. Likely DC in 1-2 days to home.     RAI,RIPUDEEP M.D. [facility] 07/28/2013, 1:04 PM Pager: [phone]  If 7PM-7AM, please contact night-coverage [email or web address] Password [email or web address]

## 2013-07-28 NOTE — Evaluation (Signed)
Reviewed and in agreement with treatment provided  Thank you,  Dabney Porter, CCC-SLP [phone]   

## 2013-07-29 LAB — CBC
HCT: 24.9 % — ABNORMAL LOW (ref 36.0–46.0)
HEMOGLOBIN: 8.8 g/dL — AB (ref 12.0–15.0)
MCH: 34.8 pg — AB (ref 26.0–34.0)
MCHC: 35.3 g/dL (ref 30.0–36.0)
MCV: 98.4 fL (ref 78.0–100.0)
Platelets: 201 10*3/uL (ref 150–400)
RBC: 2.53 MIL/uL — AB (ref 3.87–5.11)
RDW: 17.3 % — ABNORMAL HIGH (ref 11.5–15.5)
WBC: 11.1 10*3/uL — ABNORMAL HIGH (ref 4.0–10.5)

## 2013-07-29 LAB — BASIC METABOLIC PANEL
BUN: 36 mg/dL — AB (ref 6–23)
CHLORIDE: 114 meq/L — AB (ref 96–112)
CO2: 17 meq/L — AB (ref 19–32)
Calcium: 7.7 mg/dL — ABNORMAL LOW (ref 8.4–10.5)
Creatinine, Ser: 1.53 mg/dL — ABNORMAL HIGH (ref 0.50–1.10)
GFR calc Af Amer: 38 mL/min — ABNORMAL LOW (ref >90)
GFR calc non Af Amer: 33 mL/min — ABNORMAL LOW (ref >90)
GLUCOSE: 96 mg/dL (ref 70–99)
Potassium: 3.2 mEq/L — ABNORMAL LOW (ref 3.7–5.3)
Sodium: 144 mEq/L (ref 137–147)

## 2013-07-29 LAB — PHOSPHORUS: Phosphorus: 2 mg/dL — ABNORMAL LOW (ref 2.3–4.6)

## 2013-07-29 LAB — URINE CULTURE

## 2013-07-29 MED ORDER — POTASSIUM CHLORIDE ER 10 MEQ PO TBCR: 20.0000 meq | EXTENDED_RELEASE_TABLET | Freq: Every day | ORAL | Status: DC

## 2013-07-29 MED ORDER — CIPROFLOXACIN HCL 250 MG PO TABS: 250.0000 mg | ORAL_TABLET | Freq: Two times a day (BID) | ORAL | Status: DC

## 2013-07-29 MED ORDER — POTASSIUM CHLORIDE CRYS ER 20 MEQ PO TBCR
40.0000 meq | EXTENDED_RELEASE_TABLET | Freq: Once | ORAL | Status: AC
  Administered 2013-07-29: 40 meq via ORAL
  Filled 2013-07-29: qty 2

## 2013-07-29 MED ORDER — ALPRAZOLAM 0.5 MG PO TABS: 0.5000 mg | ORAL_TABLET | Freq: Three times a day (TID) | ORAL | Status: DC

## 2013-07-29 MED ORDER — K PHOS MONO-SOD PHOS DI & MONO 155-852-130 MG PO TABS: 500.0000 mg | ORAL_TABLET | Freq: Two times a day (BID) | ORAL | Status: DC

## 2013-07-29 MED ORDER — CEFUROXIME AXETIL 250 MG PO TABS: 250.0000 mg | ORAL_TABLET | Freq: Two times a day (BID) | ORAL | Status: DC

## 2013-07-29 NOTE — Progress Notes (Signed)
The patient was seen and examined. She was discussed with nurse practitioner, Ms. Vedia CofferBlack. Agree with her assessment and plan. (See discharge summary).

## 2013-07-29 NOTE — Progress Notes (Addendum)
Pt and her husband verbalize understanding of d/c instructions. They know that pt is to stop 3 medications and is not to restart them unless advised to by her cardiologist- Dr. Elease HashimotoNahser, whom we set up an appt with on 08/03/13. Pt will follow up with Dr. Karilyn Cotaehman as well. Pt has a follow up appt with Nephrology which was already established prior to d/c. Pts husband verbalizes a clear understanding of appts, and was given paperwork which tells him when to attend the visits. pts femoral line was removed by Norva KarvonenJessica Buckner, RN. Pt received prescriptions at time of d/c. Pt will be starting an antibiotic and was advised to take with food to decrease GI upset. Pt has had 3 loose/watery BMs today per pt. She had been taking Colace BID while here, which she will not be taking at d/c. Dr. Sherrie MustacheFisher aware and no further follow up needed at this time. Pt d/c via wheelchair accompanied by myself. Pts husband will be driving her home. Sheryn BisonGordon, Chazz Philson Warner

## 2013-07-29 NOTE — Discharge Summary (Signed)
The patient is a 73 year old woman who has chronic kidney disease and chronic systolic heart failure, who was admitted for a urinary tract infection. During the course of the hospitalization, she was found to be encephalopathic which was likely the result of the urinary tract infection and possibly miss use of Xanax. She was noted to have acute on chronic renal failure with a baseline creatinine of around 1.5-1.6. Nephrology was consulted and provided recommendations regarding hydration. She was given IV fluids throughout the hospitalization while her Lasix was being held. Her renal function returned to baseline, but she was still acidotic with a bicarbonate of 17. Therefore, she was discharged on oral sodium bicarbonate. Her potassium was mildly low, and therefore, she was discharged on a short regimen of potassium chloride for one week. She will need close followup of her renal function and her serum potassium. Because of low-normal blood pressures, Norvasc, furosemide, and hydralazine were withheld at the time of discharge, but they can certainly be restarted if need be per her primary care provider and primary nephrologist. Her anemia is chronic and likely secondary to chronic kidney disease. The decrease in her hemoglobin was likely from hemodilution from IV fluid hydration. Her urine culture grew out Klebsiella pneumoniae and Escherichia coli, both susceptible to ceftriaxone. She was discharged on Ceftin.

## 2013-07-29 NOTE — Progress Notes (Signed)
[name]  MRN: [id number]  DOB/AGE: 74/12/1940 74 y.o.  Primary Care Physician:REHMAN,NAJEEB U, MD  Admit date: 07/26/2013  Chief Complaint:  Chief Complaint  Patient presents with  . Altered Mental Status    S-Pt presented on  07/26/2013 with  Chief Complaint  Patient presents with  . Altered Mental Status  .    Pt today feels better.pt is asking if she can go home.    Marland Kitchen. aspirin EC  81 mg Oral Daily  . calcitRIOL  0.5 mcg Oral Daily  . carvedilol  6.25 mg Oral BID WC  . cefTRIAXone (ROCEPHIN)  IV  1 g Intravenous Q24H  . docusate sodium  100 mg Oral BID  . heparin  5,000 Units Subcutaneous Q8H  . phosphorus  500 mg Oral BID  . potassium chloride  40 mEq Oral Once  . sodium bicarbonate  1,300 mg Oral TID  . sodium chloride  3 mL Intravenous Q12H      Physical Exam: Vital signs in last 24 hours: Temp:  [97.6 F (36.4 C)-99.2 F (37.3 C)] 99.2 F (37.3 C) (02/04 0526) Pulse Rate:  [88-94] 94 (02/04 0526) Resp:  [18] 18 (02/04 0526) BP: (117-119)/(57-66) 119/66 mmHg (02/04 0526) SpO2:  [96 %-98 %] 96 % (02/04 0526) Weight change:  Last BM Date: 07/29/13  Intake/Output from previous day: 02/03 0701 - 02/04 0700 In: 200 [P.O.:200] Out: -  Total I/O In: 6225.4 [I.V.:6225.4] Out: -    Physical Exam: General- pt is awake,alert, oriented to time place and person Resp- No acute REsp distress, CTA B/L NO Rhonchi CVS- S1S2 regular in rate and rhythm GIT- BS+, soft, NT, ND EXT- NO LE Edema, Cyanosis   Lab Results: CBC  Recent Labs  07/28/13 0612 07/29/13 0608  WBC 16.0* 11.1*  HGB 9.6* 8.8*  HCT 27.9* 24.9*  PLT 205 201    BMET  Recent Labs  07/28/13 0612 07/29/13 0608  NA 142 144  K 4.0 3.2*  CL 112 114*  CO2 17* 17*  GLUCOSE 100* 96  BUN 54* 36*  CREATININE 1.90* 1.53*  CALCIUM 8.4 7.7*   Trend Creat 2015  2.69=>1.9==>1.53 2014  1.5--2.8 2013   1.3--2.5 2012   1.5--2.9 2011    1.5--5.3   MICRO Recent Results (from the  past 240 hour(s))  CULTURE, BLOOD (ROUTINE X 2)     Status: None   Collection Time    07/26/13  8:00 PM      Result Value Range Status   Specimen Description BLOOD LEFT ARM   Final   Special Requests BOTTLES DRAWN AEROBIC ONLY 5CC   Final   Culture NO GROWTH 2 DAYS   Final   Report Status PENDING   Incomplete  CULTURE, BLOOD (ROUTINE X 2)     Status: None   Collection Time    07/26/13  8:34 PM      Result Value Range Status   Specimen Description BLOOD LEFT ANTECUBITAL   Final   Special Requests BOTTLES DRAWN AEROBIC AND ANAEROBIC 6CC   Final   Culture NO GROWTH 2 DAYS   Final   Report Status PENDING   Incomplete  URINE CULTURE     Status: None   Collection Time    07/26/13  9:14 PM      Result Value Range Status   Specimen Description URINE, CATHETERIZED ILEAL LOOP UROSTOMY   Final   Special Requests NONE   Final   Culture  Setup Time  Final   Value: 07/26/2013 22:00     Performed at [facility] Count     Final   Value: >=100,000 COLONIES/ML     Performed at [facility]   Culture     Final   Value: GRAM NEGATIVE RODS     Performed at [facility]   Report Status PENDING   Incomplete      Lab Results  Component Value Date   PTH 11.4* 07/27/2013   CALCIUM 7.7* 07/29/2013   PHOS 2.0* 07/29/2013       Impression: 1)Renal  AKI secondary to Prerenal/ ATN                AKI on CKD                AKI now improving                Creat better               CKD stage 4.               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 Phosphorus not at goal.    Low- on replacement   5)ID-UTI On ABX Primary MD following  6)Electrolytes  Hypokalemic Being repleted  NOrmonatremic   7)Acid base Co2 not at goal On PO bicarb    Plan:  Will replete Kcl Will continue current therapy If plan to d/c pt will benefit from nephrology follow up.      Ky Moskowitz S 07/29/2013, 11:24 AM

## 2013-07-29 NOTE — Progress Notes (Signed)
[facility] PROGRESS NOTE  Melody CeriseBetty F [name] ONG:[id number] DOB: 11/07/1940 DOA: 07/26/2013 PCP: Malissa HippoEHMAN,NAJEEB U, MD  Assessment/Plan: #1 acute renal failure on chronic kidney disease, stage III: Usual baseline creatinine, appears to be around 1.5-1.6. BUN is also significantly elevated. BUN and creatinine continue to trend down this am. Continue IV fluids per nephrology. Holding diuretics. Volume status unclear as patient emptying urostomy bag herself. Echocardiogram from 2013 showed improvement in her systolic function. Will defer management to renal.  #2 urinary tract infection with leukocytosis in the setting of urostomy and ileal conduit: Leukocytosis continues to trend down. Max temp 99.2 Urine cultures with gram neg rods. Continue IV Rocephin day #4.   #3 acute encephalopathy: Resolved. Likely multifactorial i.e. Infectious process in setting of medications. CT head without any acute changes. Continue to hold all of her psychotropic agents. Continue xanax prn to avoid withdrawal.  Hold any other medication that may cause impairment of her mental status.   #4 history of chronic systolic heart failure: This appears to have improved based on echocardiogram from 2013. Continue with beta blocker at lower dose than normal due to her borderline low blood pressure. Holding parameters. EKG was noted to be abnormal. Repeat EKG NSR without acute changes. Troponin is reassuringly normal. Patient denies chest pain. Continue to monitor.   #5 hypokalemia: will replete but  not be too aggressive due to her renal failure.  #6 history of hypertension:SBP range 116-119. Holding her amlodipine and hydralazine for now.  #7 metabolic acidosis: related to renal function. Sodium bicarb increased per renal   #8. Anemia: hx of IDA. Current level likely dilutional. Chart review indicates baseline Hg 10-11. Will obtain FOBT. No s/sx bleeding. Will monitor.   A left femoral vein central line was placed by Dr.  Lovell SheehanJenkins due to poor venous access.    Code Status: full Family Communication: none present Disposition Plan: home hopefully tomorrow   Consultants:  Renal Dr Kristian CoveyBefekadu  Procedures:  none  Antibiotics: Rocephin 07/26/13>> HPI/Subjective: Lying in bed. Oriented to self and place. Denies pain or discomfort  Objective: Filed Vitals:   07/29/13 0526  BP: 119/66  Pulse: 94  Temp: 99.2 F (37.3 C)  Resp: 18    Intake/Output Summary (Last 24 hours) at 07/29/13 1034 Last data filed at 07/29/13 0954  Gross per 24 hour  Intake 6425.42 ml  Output      0 ml  Net 6425.42 ml   Filed Weights   07/27/13 0300  Weight: 54.749 kg (120 lb 11.2 oz)    Exam:   General:  Appears comfortable, somewhat pale  Cardiovascular: S1 and S2. i hear no murmur gallup or rub. No LE edema  Respiratory: normal effort BS are clear to auscultation bilaterally. No wheeze  Abdomen: non-distended. Non-tender to palpation. Urostomy bag intact with clear gold urine. +BS throughout  Musculoskeletal: no clubbing or cyanosis   Data Reviewed: Basic Metabolic Panel:  Recent Labs Lab 07/26/13 2033 07/27/13 0601 07/27/13 1141 07/28/13 0612 07/29/13 0608  NA 133* 141  --  142 144  K 2.8* 3.1*  --  4.0 3.2*  CL 96 106  --  112 114*  CO2 20 18*  --  17* 17*  GLUCOSE 116* 101*  --  100* 96  BUN 83* 77*  --  54* 36*  CREATININE 2.69* 2.38*  --  1.90* 1.53*  CALCIUM 10.2 9.1 9.1 8.4 7.7*  PHOS  --   --   --  1.8* 2.0*   Liver Function  Tests:  Recent Labs Lab 07/26/13 2033 07/27/13 0601  AST 12 12  ALT 10 8  ALKPHOS 62 77  BILITOT 0.3 0.3  PROT 6.9 6.0  ALBUMIN 2.7* 2.3*   No results found for this basename: LIPASE, AMYLASE,  in the last 168 hours No results found for this basename: AMMONIA,  in the last 168 hours CBC:  Recent Labs Lab 07/26/13 2033 07/27/13 0601 07/28/13 0612 07/29/13 0608  WBC 22.2* 18.7* 16.0* 11.1*  NEUTROABS 18.7*  --   --   --   HGB 11.8* 10.4* 9.6* 8.8*   HCT 33.4* 29.0* 27.9* 24.9*  MCV 96.0 96.3 98.2 98.4  PLT 232 210 205 201   Cardiac Enzymes:  Recent Labs Lab 07/26/13 2033 07/27/13 0601 07/27/13 1052  CKTOTAL 41  --   --   TROPONINI <0.30 <0.30 <0.30   BNP (last 3 results) No results found for this basename: PROBNP,  in the last 8760 hours CBG: No results found for this basename: GLUCAP,  in the last 168 hours  Recent Results (from the past 240 hour(s))  CULTURE, BLOOD (ROUTINE X 2)     Status: None   Collection Time    07/26/13  8:00 PM      Result Value Range Status   Specimen Description BLOOD LEFT ARM   Final   Special Requests BOTTLES DRAWN AEROBIC ONLY 5CC   Final   Culture NO GROWTH 2 DAYS   Final   Report Status PENDING   Incomplete  CULTURE, BLOOD (ROUTINE X 2)     Status: None   Collection Time    07/26/13  8:34 PM      Result Value Range Status   Specimen Description BLOOD LEFT ANTECUBITAL   Final   Special Requests BOTTLES DRAWN AEROBIC AND ANAEROBIC 6CC   Final   Culture NO GROWTH 2 DAYS   Final   Report Status PENDING   Incomplete  URINE CULTURE     Status: None   Collection Time    07/26/13  9:14 PM      Result Value Range Status   Specimen Description URINE, CATHETERIZED ILEAL LOOP UROSTOMY   Final   Special Requests NONE   Final   Culture  Setup Time     Final   Value: 07/26/2013 22:00     Performed at [facility] Count     Final   Value: >=100,000 COLONIES/ML     Performed at [facility]   Culture     Final   Value: GRAM NEGATIVE RODS     Performed at [facility]   Report Status PENDING   Incomplete     Studies: No results found.  Scheduled Meds: . aspirin EC  81 mg Oral Daily  . calcitRIOL  0.5 mcg Oral Daily  . carvedilol  6.25 mg Oral BID WC  . cefTRIAXone (ROCEPHIN)  IV  1 g Intravenous Q24H  . docusate sodium  100 mg Oral BID  . heparin  5,000 Units Subcutaneous Q8H  . phosphorus  500 mg Oral BID  . sodium bicarbonate  1,300 mg Oral TID   . sodium chloride  3 mL Intravenous Q12H   Continuous Infusions: . sodium chloride 125 mL/hr at 07/28/13 1620    Principal Problem:   Acute on chronic renal insufficiency Active Problems:   Dementia   UTI (lower urinary tract infection)   Hypokalemia   Metabolic acidosis   Encephalopathy, toxic  Anemia   Abdominal pain, chronic, left lower quadrant   Leukocytosis    Time spent: 30 minutes    [facility]  [facility] Pager [phone]. If 7PM-7AM, please contact night-coverage at [email or web address], password [facility] 07/29/2013, 10:34 AM  LOS: 3 days

## 2013-07-29 NOTE — Discharge Summary (Signed)
Physician Discharge Summary  [name] ZOX:[id number] DOB: [birth year] DOA: 07/26/2013  PCP: Malissa Hippo, MD  Admit date: 07/26/2013 Discharge date: 07/29/2013  Time spent: 45 minutes  Recommendations for Outpatient Follow-up:  1. Follow up with Dr. Karilyn Cota PCP in 1 week for evaluation of symptoms, review of medications. Recommend BMET for evaluation of potassium level. Recommend BP evaluation as medications adjusted.  2. Has appointment with Dr. Darrick Penna 08/11/13 for renal follow up  Discharge Diagnoses:    Acute on chronic renal insufficiency   UTI (lower urinary tract infection)  Encephalopathy, toxic   Hypokalemia   Metabolic acidosis  chronic systolic heart failure   Anemia   Abdominal pain, chronic, left lower quadrant   Leukocytosis HTN   Discharge Condition: stable  Diet recommendation: regular  Filed Weights   07/27/13 0300  Weight: 54.749 kg (120 lb 11.2 oz)    History of present illness:  [name] is a 73 y.o. 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  Patient has dementia at baseline and is confused at presentation. According to nursing reports and ED physician notes, patient was brought in because she has had worsening in her confusion. She had been lying in the couch all day long without doing any activities. She also appeared to be more restless than normal. Patient takes medications for insomnia and it appears than 7 tablets were missing after accounting for her daily dosage. Patient was unable to tell us if she took more tablets than she should have. She was also found to have a low-grade temperature in the emergency department and had elevated WBC and abnormal UA. Workup revealed that she had acute on  chronic renal failure and we were called for further management.    Hospital Course:  #1 acute renal failure on chronic kidney disease, stage III: Usual baseline creatinine, appears to be around 1.5-1.6. Likely multifactorial i.e. Decreased po intake in setting of UTI, medication administration error at home.  BUN was also significantly elevated. Provided with IV fluids and nephrotoxins were held. Evaluated by nephrology.  BUN and creatinine trended down. Urine output remained good. At discharge creatinine 1.53. She has appointment with renal MD 08/11/13.   #2 urinary tract infection with leukocytosis in the setting of urostomy and ileal conduit: white count 22.2 on admission. Provided with rocephin for 3 days. Leukocytosis continues to trend down. Max temp 99.2 Urine cultures with e.coli and klebsiella pneumoniae sensitive to ceftin. Will discharge on ceftub for 5 days to complete 7 day course.    #3 acute encephalopathy: Resolved at discharge. Likely multifactorial i.e. Infectious process in setting of medications. CT head without any acute changes. Continue to hold all of her psychotropic agents. Continue xanax prn to avoid withdrawal. On admission report of 7 xanax pills being missing. Husband reports administering her meds. Educated patient and family to xanax administration.   #4 history of chronic systolic heart failure: This appears to have improved based on echocardiogram from 2013. Continue with beta blocker at lower dose than normal due to her borderline low blood pressure. EKG was noted to be abnormal. Repeat EKG NSR without acute changes. Troponin is reassuringly normal. Patient denied chest pain.  #5 hypokalemia: Repleted. Will need BMET Op to track potassium level.   #6 history of hypertension:SBP  range 116-119.  Home meds include norvasc and lasix. These have been on hold. Have been giving home coreg. Will continue this regimen at discharge. Will need close OP follow up for optimal BP  control.   #7 metabolic acidosis: related to renal function. Sodium bicarb increased per renal  #8. Anemia: hx of IDA. Current level likely dilutional. Chart review indicates baseline Hg 10-11. no s/sx bleeding. Will monitor.  A left femoral vein central line was placed by Dr. Lovell Sheehan due to poor venous access.    Procedures:  none  Consultations:  Renal  Discharge Exam: Filed Vitals:   07/29/13 0526  BP: 119/66  Pulse: 94  Temp: 99.2 F (37.3 C)  Resp: 18    General: well nourished NAD Cardiovascular: S1 and S2. No murmur gallup or rub no LE edema Respiratory: normal effort BS are clear bilaterally. No wheeze no rhonchi  Discharge Instructions       Future Appointments Provider Department Dept Phone   08/04/2013 9:30 AM Len Blalock, NP [facility] [phone]   10/19/2013 2:30 PM Malissa Hippo, MD [facility] [phone]       Medication List    STOP taking these medications       amLODipine 5 MG tablet  Commonly known as:  NORVASC     furosemide 40 MG tablet  Commonly known as:  LASIX     hydrALAZINE 10 MG tablet  Commonly known as:  APRESOLINE      TAKE these medications       alendronate 70 MG tablet  Commonly known as:  FOSAMAX  Take 70 mg by mouth every 7 (seven) days. Take with a full glass of water on an empty stomach. Takes on Sunday.     ALPRAZolam 0.5 MG tablet  Commonly known as:  XANAX  Take 1 tablet (0.5 mg total) by mouth 3 (three) times daily.     aspirin EC 81 MG tablet  Take 81 mg by mouth daily.     calcitRIOL 0.5 MCG capsule  Commonly known as:  ROCALTROL  Take 0.5 mcg by mouth daily.     calcium citrate-vitamin D 200-200 MG-UNIT Tabs  Take 1 tablet by mouth daily.     carvedilol 25 MG tablet  Commonly known as:  COREG  Take 25 mg by mouth 2 (two) times daily.     cefUROXime 250 MG tablet  Commonly known as:  CEFTIN  Take 1 tablet (250 mg total) by mouth 2 (two) times  daily with a meal.     doxepin 50 MG capsule  Commonly known as:  SINEQUAN  Take 50 mg by mouth at bedtime.     HYDROcodone-acetaminophen 5-325 MG per tablet  Commonly known as:  NORCO/VICODIN  Take 1 tablet by mouth every 8 (eight) hours as needed. pain     multivitamin tablet  Take 1 tablet by mouth daily.     nitroGLYCERIN 0.4 MG SL tablet  Commonly known as:  NITROSTAT  Place 1 tablet (0.4 mg total) under the tongue every 5 (five) minutes as needed. For chest pain     potassium chloride 10 MEQ tablet  Commonly known as:  K-DUR  Take 2 tablets (20 mEq total) by mouth daily.     sodium bicarbonate 650 MG tablet  Take 1,300 mg by mouth 2 (two) times daily.       Allergies  Allergen Reactions  . Ketorolac Tromethamine Rash   Follow-up Information  Follow up with Malissa HippoEHMAN,NAJEEB U, MD On 08/04/2013. (recommend bmet and cbc as well as medication review at 9:30 am)    Specialty:  Gastroenterology   Contact information:   [address], [address] [place] [address] [phone]        The results of significant diagnostics from this hospitalization (including imaging, microbiology, ancillary and laboratory) are listed below for reference.    Significant Diagnostic Studies: Ct Head Wo Contrast  07/26/2013   CLINICAL DATA:  Dementia.  Altered mental status.  EXAM: CT HEAD WITHOUT CONTRAST  TECHNIQUE: Contiguous axial images were obtained from the base of the skull through the vertex without intravenous contrast.  COMPARISON:  Head CT scan and 04/29/2013.  FINDINGS: Again seen are atrophy and chronic microvascular ischemic change. No evidence of acute abnormality including infarction, hemorrhage, mass lesion, mass effect, midline shift or abnormal extra axial fluid collection. Cavum septum pellucidum is noted. The calvarium is intact.  IMPRESSION: No acute finding. Atrophy and chronic microvascular ischemic change.   Electronically Signed   By: Drusilla Kannerhomas  Dalessio M.D.   On:  07/26/2013 21:59   Dg Chest Portable 1 View  07/26/2013   CLINICAL DATA:  Altered mental status  EXAM: PORTABLE CHEST - 1 VIEW  COMPARISON:  DG THORACIC SPINE dated 01/15/2013; DG CHEST 1V PORT dated 05/27/2012  FINDINGS: The heart size and mediastinal contours are within normal limits. Both lungs are clear. The visualized skeletal structures are unremarkable.  IMPRESSION: No active disease.   Electronically Signed   By: Elige KoHetal  Patel   On: 07/26/2013 19:10    Microbiology: Recent Results (from the past 240 hour(s))  CULTURE, BLOOD (ROUTINE X 2)     Status: None   Collection Time    07/26/13  8:00 PM      Result Value Range Status   Specimen Description BLOOD LEFT ARM   Final   Special Requests BOTTLES DRAWN AEROBIC ONLY 5CC   Final   Culture NO GROWTH 3 DAYS   Final   Report Status PENDING   Incomplete  CULTURE, BLOOD (ROUTINE X 2)     Status: None   Collection Time    07/26/13  8:34 PM      Result Value Range Status   Specimen Description BLOOD LEFT ANTECUBITAL   Final   Special Requests BOTTLES DRAWN AEROBIC AND ANAEROBIC 6CC   Final   Culture NO GROWTH 3 DAYS   Final   Report Status PENDING   Incomplete  URINE CULTURE     Status: None   Collection Time    07/26/13  9:14 PM      Result Value Range Status   Specimen Description URINE, CATHETERIZED ILEAL LOOP UROSTOMY   Final   Special Requests NONE   Final   Culture  Setup Time     Final   Value: 07/26/2013 22:00     [facility]formed [facility]   [place] Count     Final   Value: >=100,000 COLONIES/ML     [facility]formed at [facility]   Culture     Final   Value: ESCHERICHIA COLI     KLEBSIELLA PNEUMONIAE     [facility]formed at [facility]   Report Status 07/29/2013 FINAL   Final   Organism ID, Bacteria ESCHERICHIA COLI   Final   Organism ID, Bacteria KLEBSIELLA PNEUMONIAE   Final     Labs: Basic Metabolic Panel:  Recent Labs Lab 07/26/13 2033 07/27/13 0601 07/27/13 1141 07/28/13  1610 07/29/13 0608  NA  133* 141  --  142 144  K 2.8* 3.1*  --  4.0 3.2*  CL 96 106  --  112 114*  CO2 20 18*  --  17* 17*  GLUCOSE 116* 101*  --  100* 96  BUN 83* 77*  --  54* 36*  CREATININE 2.69* 2.38*  --  1.90* 1.53*  CALCIUM 10.2 9.1 9.1 8.4 7.7*  PHOS  --   --   --  1.8* 2.0*   Liver Function Tests:  Recent Labs Lab 07/26/13 2033 07/27/13 0601  AST 12 12  ALT 10 8  ALKPHOS 62 77  BILITOT 0.3 0.3  PROT 6.9 6.0  ALBUMIN 2.7* 2.3*   No results found for this basename: LIPASE, AMYLASE,  in the last 168 hours No results found for this basename: AMMONIA,  in the last 168 hours CBC:  Recent Labs Lab 07/26/13 2033 07/27/13 0601 07/28/13 0612 07/29/13 0608  WBC 22.2* 18.7* 16.0* 11.1*  NEUTROABS 18.7*  --   --   --   HGB 11.8* 10.4* 9.6* 8.8*  HCT 33.4* 29.0* 27.9* 24.9*  MCV 96.0 96.3 98.2 98.4  PLT 232 210 205 201   Cardiac Enzymes:  Recent Labs Lab 07/26/13 2033 07/27/13 0601 07/27/13 1052  CKTOTAL 41  --   --   TROPONINI <0.30 <0.30 <0.30   BNP: BNP (last 3 results) No results found for this basename: PROBNP,  in the last 8760 hours CBG: No results found for this basename: GLUCAP,  in the last 168 hours     Signed:  Gwenyth Bender  [facility] 07/29/2013, 2:49 PM

## 2013-07-31 LAB — CULTURE, BLOOD (ROUTINE X 2)
Culture: NO GROWTH
Culture: NO GROWTH

## 2013-08-01 ENCOUNTER — Encounter (HOSPITAL_COMMUNITY): Payer: Self-pay | Admitting: Emergency Medicine

## 2013-08-01 ENCOUNTER — Emergency Department (HOSPITAL_COMMUNITY)
Admission: EM | Admit: 2013-08-01 | Discharge: 2013-08-01 | Disposition: A | Discharge status: Home / Self Care | Payer: Medicare Other | Attending: Emergency Medicine | Admitting: Emergency Medicine

## 2013-08-01 DIAGNOSIS — N189 Chronic kidney disease, unspecified: Secondary | ICD-10-CM | POA: Insufficient documentation

## 2013-08-01 DIAGNOSIS — G8929 Other chronic pain: Secondary | ICD-10-CM | POA: Insufficient documentation

## 2013-08-01 DIAGNOSIS — Z5189 Encounter for other specified aftercare: Secondary | ICD-10-CM

## 2013-08-01 DIAGNOSIS — I252 Old myocardial infarction: Secondary | ICD-10-CM | POA: Insufficient documentation

## 2013-08-01 DIAGNOSIS — F411 Generalized anxiety disorder: Secondary | ICD-10-CM | POA: Insufficient documentation

## 2013-08-01 DIAGNOSIS — R011 Cardiac murmur, unspecified: Secondary | ICD-10-CM | POA: Insufficient documentation

## 2013-08-01 DIAGNOSIS — Z936 Other artificial openings of urinary tract status: Secondary | ICD-10-CM | POA: Insufficient documentation

## 2013-08-01 DIAGNOSIS — Z4801 Encounter for change or removal of surgical wound dressing: Secondary | ICD-10-CM | POA: Insufficient documentation

## 2013-08-01 DIAGNOSIS — Z9181 History of falling: Secondary | ICD-10-CM | POA: Insufficient documentation

## 2013-08-01 DIAGNOSIS — F329 Major depressive disorder, single episode, unspecified: Secondary | ICD-10-CM | POA: Insufficient documentation

## 2013-08-01 DIAGNOSIS — Z8701 Personal history of pneumonia (recurrent): Secondary | ICD-10-CM | POA: Insufficient documentation

## 2013-08-01 DIAGNOSIS — M129 Arthropathy, unspecified: Secondary | ICD-10-CM | POA: Insufficient documentation

## 2013-08-01 DIAGNOSIS — Z7982 Long term (current) use of aspirin: Secondary | ICD-10-CM | POA: Insufficient documentation

## 2013-08-01 DIAGNOSIS — I129 Hypertensive chronic kidney disease with stage 1 through stage 4 chronic kidney disease, or unspecified chronic kidney disease: Secondary | ICD-10-CM | POA: Insufficient documentation

## 2013-08-01 DIAGNOSIS — F3289 Other specified depressive episodes: Secondary | ICD-10-CM | POA: Insufficient documentation

## 2013-08-01 DIAGNOSIS — Z8719 Personal history of other diseases of the digestive system: Secondary | ICD-10-CM | POA: Insufficient documentation

## 2013-08-01 DIAGNOSIS — I509 Heart failure, unspecified: Secondary | ICD-10-CM | POA: Insufficient documentation

## 2013-08-01 DIAGNOSIS — F0390 Unspecified dementia without behavioral disturbance: Secondary | ICD-10-CM | POA: Insufficient documentation

## 2013-08-01 DIAGNOSIS — Z8781 Personal history of (healed) traumatic fracture: Secondary | ICD-10-CM | POA: Insufficient documentation

## 2013-08-01 DIAGNOSIS — N2581 Secondary hyperparathyroidism of renal origin: Secondary | ICD-10-CM | POA: Insufficient documentation

## 2013-08-01 DIAGNOSIS — Z79899 Other long term (current) drug therapy: Secondary | ICD-10-CM | POA: Insufficient documentation

## 2013-08-01 DIAGNOSIS — Z862 Personal history of diseases of the blood and blood-forming organs and certain disorders involving the immune mechanism: Secondary | ICD-10-CM | POA: Insufficient documentation

## 2013-08-01 DIAGNOSIS — Z932 Ileostomy status: Secondary | ICD-10-CM | POA: Insufficient documentation

## 2013-08-01 DIAGNOSIS — Z8619 Personal history of other infectious and parasitic diseases: Secondary | ICD-10-CM | POA: Insufficient documentation

## 2013-08-01 DIAGNOSIS — Z87891 Personal history of nicotine dependence: Secondary | ICD-10-CM | POA: Insufficient documentation

## 2013-08-01 DIAGNOSIS — Z872 Personal history of diseases of the skin and subcutaneous tissue: Secondary | ICD-10-CM | POA: Insufficient documentation

## 2013-08-01 DIAGNOSIS — Z792 Long term (current) use of antibiotics: Secondary | ICD-10-CM | POA: Insufficient documentation

## 2013-08-01 DIAGNOSIS — Z87440 Personal history of urinary (tract) infections: Secondary | ICD-10-CM | POA: Insufficient documentation

## 2013-08-01 NOTE — ED Notes (Signed)
Wound to left lower groin area dressed with telfa, 4X4's and tape, pt tolerated well,

## 2013-08-01 NOTE — Discharge Instructions (Signed)
Wound Check  Your wound appears healthy today. Your wound will heal gradually over time. Eventually a scar will form that will fade with time.  FACTORS THAT AFFECT SCAR FORMATION:   People differ in the severity in which they scar.   Scar severity varies according to location, size, and the traits you inherited from your parents (genetic predisposition).   Irritation to the wound from infection, rubbing, or chemical exposure will increase the amount of scar formation.  HOME CARE INSTRUCTIONS    If you were given a dressing, you should change it at least once a day or as instructed by your caregiver. If the bandage sticks, soak it off with a solution of hydrogen peroxide.   If the bandage becomes wet, dirty, or develops a bad smell, change it as soon as possible.   Look for signs of infection.   Only take over-the-counter or prescription medicines for pain, discomfort, or fever as directed by your caregiver.  SEEK IMMEDIATE MEDICAL CARE IF:    You have redness, swelling, or increasing pain in the wound.   You notice pus coming from the wound.   You have a fever.   You notice a bad smell coming from the wound or dressing.  Document Released: 03/17/2004 Document Revised: 09/03/2011 Document Reviewed: 06/11/2005  [employer] Patient Information 2014 [employer]

## 2013-08-01 NOTE — ED Provider Notes (Signed)
CSN: [id number]     Arrival date & time 08/01/13  1029 History   First MD Initiated Contact with Patient 08/01/13 1305     Chief Complaint  Patient presents with  . Wound Check   (Consider location/radiation/quality/duration/timing/severity/associated sxs/prior Treatment) HPI  73 year old female presenting with her family for evaluation of a wound to her left groin region. Patient recently admitted to the hospital. She was difficult to obtain peripheral IV access on. She ultimately had a left femoral central line placed. Husband is concerned because there has been persistent clear drainage from the site. Line was removed on Thursday. No increasing pain. No surrounding redness.  Past Medical History  Diagnosis Date  . Chronic kidney disease   . Gastroesophageal reflux   . Chronic UTI   . Hydronephrosis     She has chronic mild left   . AV fistula     clotted in her left forearm  . Cellulitis   . Pneumothorax     secondary to central line placement  . Single kidney     post right nephrectomy  . Myocardial infarction 2011; 2012  . Numbness and tingling of both legs   . Anginal pain   . Heart murmur   . Dysrhythmia     "heart beats real fast"  . Chronic anemia   . Pneumonia     "one time"  . Chronic chest pain 01/14/12    1-2 X/day/H&P  . Arthritis     "at different places"  . Anxiety   . Depression   . Headache(784.0)     "alot; not daily"  . Frequent falls   . Dementia   . Collagen vascular disease   . Renal insufficiency   . S/P ileal conduit     post cystectomy/ileal conduit  . Anemia     [facility]  . Iron deficiency     [facility]  . Dysarthria 03/31/13    Speech is almost a little bit dysarthritic  . Unsteady     Stronger since she came out of nursing home & is walking with a cane  . Edema 03/31/13    Gained 9 pounds overnight & telephone nurse instructed her to take 20mg  of Lasix bid on 03/30/13.  Still has swelling in legs.  . Metabolic acidosis     Chronic  . Hypertension     Good control  . CHF (congestive heart failure)     Systolic heart failure EF 25-30% by ECHO 2012  . Altered mental state     Recurrent episodes, with confusion variously related to narcotic use, infection, etc. and has required hospitilization, nursing home stays for rehab related to weakness, encephalopathy, etc.  . History of sepsis 2012    With E.Coli bacteremia  . E coli bacteremia 2012    History of sepsis & E.Coli bacteremia  . Secondary hyperparathyroidism     Dr. Caryn SectionFox had a sestamibi scan of her neck in May 2014 which was negative for parathyroid activity in her neck.   Past Surgical History  Procedure Laterality Date  . Hip arthroplasty      left; S/P fracture  . Rod left leg  2012    left hip "to almost my knee; for my hip"  . Creation / revision of ileostomy / jejunostomy  1973  . Nephrectomy  1973    right  . Fracture surgery    . Partial colectomy  1970  . Tonsillectomy and adenoidectomy      "when I was a kid"  . Cholecystectomy  2000  . Appendectomy  2000  . Abdominal hysterectomy    . Dilation and curettage of uterus    . Cesarean section  1969    only 4th of 4 children was c-section  . Av fistula placement  1970's    LFA  . Bladder removal  1974    with ileal conduit and urostomy   Family History  Problem Relation Age of Onset  . Heart attack Mother   . Healthy Daughter   . Healthy Daughter   . Healthy Son    History  Substance Use Topics  . Smoking status: Former Smoker -- 2 years    Types: Cigarettes    Quit date: 06/25/2004  . Smokeless tobacco: Never Used     Comment: 01/14/12 "just smoked one cigarette, once in awhile for ~ 2 years"  . Alcohol Use: No   OB History   Grav Para Term Preterm Abortions TAB SAB Ect Mult Living                 Review of Systems  All systems reviewed and negative, other than as noted in HPI.   Allergies  Ketorolac  tromethamine  Home Medications   Current Outpatient Rx  Name  Route  Sig  Dispense  Refill  . alendronate (FOSAMAX) 70 MG tablet   Oral   Take 70 mg by mouth every 7 (seven) days. Take with a full glass of water on an empty stomach. Takes on Sunday.         . ALPRAZolam (XANAX) 0.5 MG tablet   Oral   Take 1 tablet (0.5 mg total) by mouth 3 (three) times daily.   60 tablet   0     Hold for sedation   . aspirin EC 81 MG tablet   Oral   Take 81 mg by mouth daily.         . calcitRIOL (ROCALTROL) 0.5 MCG capsule   Oral   Take 0.5 mcg by mouth daily.         . calcium citrate-vitamin D 200-200 MG-UNIT TABS   Oral   Take 1 tablet by mouth daily.           . carvedilol (COREG) 25 MG tablet   Oral   Take 25 mg by mouth 2 (two) times daily.         . cefUROXime (CEFTIN) 250 MG tablet   Oral   Take 1 tablet (250 mg total) by mouth 2 (two) times daily with a meal.   10 tablet   0   . doxepin (SINEQUAN) 50 MG capsule   Oral   Take 50 mg by mouth at bedtime.         Marland Kitchen HYDROcodone-acetaminophen (NORCO/VICODIN) 5-325 MG per tablet   Oral   Take 1 tablet by mouth every 8 (eight) hours as needed. pain         . Multiple Vitamin (MULTIVITAMIN) tablet   Oral   Take 1 tablet by mouth daily.           . nitroGLYCERIN (NITROSTAT) 0.4 MG SL tablet   Sublingual   Place 1 tablet (0.4 mg total) under the tongue every 5 (five) minutes as needed. For chest pain   25 tablet   5   . potassium chloride (K-DUR) 10 MEQ tablet   Oral   Take 2  tablets (20 mEq total) by mouth daily.   14 tablet   0   . sodium bicarbonate 650 MG tablet   Oral   Take 1,300 mg by mouth 2 (two) times daily.           BP 127/46  Pulse 65  Temp(Src) 97.9 F (36.6 C) (Oral)  Resp 18  Ht 5\' 5"  (1.651 m)  Wt 120 lb (54.432 kg)  BMI 19.97 kg/m2  SpO2 100% Physical Exam  Nursing note and vitals reviewed. Constitutional: She appears well-developed and well-nourished. No distress.   HENT:  Head: Normocephalic and atraumatic.  Eyes: Conjunctivae are normal. Right eye exhibits no discharge. Left eye exhibits no discharge.  Neck: Neck supple.  Cardiovascular: Normal rate, regular rhythm and normal heart sounds.  Exam reveals no gallop and no friction rub.   No murmur heard. Pulmonary/Chest: Effort normal and breath sounds normal. No respiratory distress.  Abdominal: Soft. She exhibits no distension. There is no tenderness.  Right-sided urostomy with pale yellow urine in bed. Evidence of prior multiple abdominal surgeries. Abdomen is nontender.  Musculoskeletal: She exhibits no edema and no tenderness.  Neurological: She is alert.  Skin: Skin is warm and dry.  Punctate wound to the left angle region consistent with recent left femoral central line placement. Small amount of clear drainage noted from this wound. Nontender. No surrounding redness. Evidence of prior skin grafting from b/l thighs.   Psychiatric: She has a normal mood and affect. Her behavior is normal. Thought content normal.    ED Course  Procedures (including critical care time) Labs Review Labs Reviewed - No data to display Imaging Review No results found.  EKG Interpretation   None       MDM   1. Visit for wound check    73 year old female presenting for wound check. There is some persistent drainage from the site of her recent central line.  This is clear to serous. No evidence of infection.  Discussed continued wound care and return precautions.    Raeford Razor, MD 08/01/13 1343

## 2013-08-01 NOTE — ED Notes (Signed)
Patient c/o left groin pain. Recent admission for UTI and had central line in left groin. C/o drainage, clear at present, but started out serosanguinous per family. Family states requires multiple dressings. Patient c/o pain with walking.

## 2013-08-03 ENCOUNTER — Encounter: Payer: Self-pay | Admitting: Cardiovascular Disease

## 2013-08-03 ENCOUNTER — Ambulatory Visit (INDEPENDENT_AMBULATORY_CARE_PROVIDER_SITE_OTHER): Payer: Medicare Other | Admitting: Cardiovascular Disease

## 2013-08-03 VITALS — BP 126/62 | HR 62 | Ht 65.5 in | Wt 124.0 lb

## 2013-08-03 DIAGNOSIS — I509 Heart failure, unspecified: Secondary | ICD-10-CM

## 2013-08-03 NOTE — Patient Instructions (Signed)
Your physician wants you to follow-up in: 6 MONTHS.  You will receive a reminder letter in the mail two months in advance. If you don't receive a letter, please call our office to schedule the follow-up appointment.  Your physician recommends that you continue on your current medications as directed. Please refer to the Current Medication list given to you today.  

## 2013-08-03 NOTE — Assessment & Plan Note (Signed)
Mrs. [name] status remains fairly tenuous. Her congestive heart failure appears to be satisfactory control. She continues to have other medical issues. She was recently hospitalized with generalized weakness and acute renal insufficiency. She's done much better.  Her prognosis overall still remains somewhat limited.  I'll see her in 6 months for followup

## 2013-08-03 NOTE — Progress Notes (Signed)
[name] Date of Birth  Nov 09, 1940 [name] [address]    [address] [place], [place]  [address] [phone]  Fax  [phone]  History of Present Illness:  73 year old female with a history of left ventricular systolic heart failure after hip fracture.  She complains of some intermittent episodes of chest pain. She states that it feels like soreness  in the chest. It lasts for several minutes. It causes her some diaphoresis.  She has a history of congestive heart failure. Her ejection fraction in December 2001 was normal. Her most recent ejection fraction is now 25%.  Has a history of chronic renal insufficiency. When I saw her in November, 2011, she had acute renal insufficiency with a creatinine of 5.5. She also had mildly positive cardiac enzymes thought to be to her underlying medical illnesses.  She has a history of chronic renal insufficiency. She's followed by the nephrologists Marina Gravel, MD). She has had several episodes of acute renal failure and several attempts have been made to place an AV fistula in her. Had not been able to place an AV fistula because of her poor veins.   She continues to have some intermittent episodes of chest pain. These occur particularly when she bends over to make the bed.  She does not get any regular exercise. She does bring in the groceries on occasion she does not typically have any chest pain with activity.  Her bedroom is on the first floor of her house so she does not climb stairs.    Is fairly unsteady on her feet. She falls fairly frequently against the walls. She has a large bruise on her left arm.  She complains of being profoundly weak. She doesn't have much chest pain and it does not seem to limit her daily activities. She appears to be generally stronger today than she was the last time I saw her.  December 22, 2012: [name] is doing about the same.  She feels weak on occasion.   She has occasional CP.  Typically in the  left side of her chest ( she had to ask her husband about where her pains were) , no dyspnea.  She seems to have some memory issues.  Needs assurance from her husband for most of her answers.    Dec. 23, 2014: She is doing well from a cardiac standpoint.  She broke her right wrist.   Was changing her bed and mattress knocked her down.    Feb. 9, 2015:  That he was recently hospitalized for confusion. She has a history of dementia, chronic systolic heart failure, hypertension, chronic kidney disease. She presented today for routine followup and complained of having some chest pain while in the lobby. She was given one sublingual nitroglycerin.  She is pain free at this point.   It turns out that this was due to anxiety.  No active cardiac issues  She had a left femoral IV line placed.  This site has been oozing.  She continues to have some drainage from the site.   She was seen in the ER for this.  The draigage continues to improve.      Current Outpatient Prescriptions on File Prior to Visit  Medication Sig Dispense Refill  . alendronate (FOSAMAX) 70 MG tablet Take 70 mg by mouth every 7 (seven) days. Take with a full glass of water on an empty stomach. Takes on Sunday.      . ALPRAZolam (XANAX) 0.5 MG tablet  Take 1 tablet (0.5 mg total) by mouth 3 (three) times daily.  60 tablet  0  . aspirin EC 81 MG tablet Take 81 mg by mouth daily.      . calcitRIOL (ROCALTROL) 0.5 MCG capsule Take 0.5 mcg by mouth daily.      . calcium citrate-vitamin D 200-200 MG-UNIT TABS Take 1 tablet by mouth daily.        . carvedilol (COREG) 25 MG tablet Take 25 mg by mouth 2 (two) times daily.      Marland Kitchen. doxepin (SINEQUAN) 50 MG capsule Take 50 mg by mouth at bedtime.      Marland Kitchen. HYDROcodone-acetaminophen (NORCO/VICODIN) 5-325 MG per tablet Take 1 tablet by mouth every 8 (eight) hours as needed. pain      . Multiple Vitamin (MULTIVITAMIN) tablet Take 1 tablet by mouth daily.        . nitroGLYCERIN (NITROSTAT) 0.4 MG SL  tablet Place 1 tablet (0.4 mg total) under the tongue every 5 (five) minutes as needed. For chest pain  25 tablet  5  . sodium bicarbonate 650 MG tablet Take 1,300 mg by mouth 2 (two) times daily.        No current facility-administered medications on file prior to visit.  she is not taking the Atenolol  Allergies  Allergen Reactions  . Ketorolac Tromethamine Rash    Past Medical History  Diagnosis Date  . Chronic kidney disease   . Gastroesophageal reflux   . Chronic UTI   . Hydronephrosis     She has chronic mild left   . AV fistula     clotted in her left forearm  . Cellulitis   . Pneumothorax     secondary to central line placement  . Single kidney     post right nephrectomy  . Myocardial infarction 2011; 2012  . Numbness and tingling of both legs   . Anginal pain   . Heart murmur   . Dysrhythmia     "heart beats real fast"  . Chronic anemia   . Pneumonia     "one time"  . Chronic chest pain 01/14/12    1-2 X/day/H&P  . Arthritis     "at different places"  . Anxiety   . Depression   . Headache(784.0)     "alot; not daily"  . Frequent falls   . Dementia   . Collagen vascular disease   . Renal insufficiency   . S/P ileal conduit     post cystectomy/ileal conduit  . Anemia     [facility]  . Iron deficiency     [facility]  . Dysarthria 03/31/13    Speech is almost a little bit dysarthritic  . Unsteady     Stronger since she came out of nursing home & is walking with a cane  . Edema 03/31/13    Gained 9 pounds overnight & telephone nurse instructed her to take 20mg  of Lasix bid on 03/30/13. Still has swelling in legs.  . Metabolic acidosis     Chronic  . Hypertension     Good control  . CHF (congestive heart failure)     Systolic heart failure EF 25-30% by ECHO 2012  . Altered mental state     Recurrent episodes, with confusion variously related to narcotic use, infection, etc. and has required  hospitilization, nursing home stays for rehab related to weakness, encephalopathy, etc.  . History  of sepsis 2012    With E.Coli bacteremia  . E coli bacteremia 2012    History of sepsis & E.Coli bacteremia  . Secondary hyperparathyroidism     Dr. Caryn Section had a sestamibi scan of her neck in May 2014 which was negative for parathyroid activity in her neck.    Past Surgical History  Procedure Laterality Date  . Hip arthroplasty      left; S/P fracture  . Rod left leg  2012    left hip "to almost my knee; for my hip"  . Creation / revision of ileostomy / jejunostomy  1973  . Nephrectomy  1973    right  . Fracture surgery    . Partial colectomy  1970  . Tonsillectomy and adenoidectomy      "when I was a kid"  . Cholecystectomy  2000  . Appendectomy  2000  . Abdominal hysterectomy    . Dilation and curettage of uterus    . Cesarean section  1969    only 4th of 4 children was c-section  . Av fistula placement  1970's    LFA  . Bladder removal  1974    with ileal conduit and urostomy    History  Smoking status  . Former Smoker -- 2 years  . Types: Cigarettes  . Quit date: 06/25/2004  Smokeless tobacco  . Never Used    Comment: 01/14/12 "just smoked one cigarette, once in awhile for ~ 2 years"    History  Alcohol Use No    Family History  Problem Relation Age of Onset  . Heart attack Mother   . Healthy Daughter   . Healthy Daughter   . Healthy Son     Reviw of Systems:  Reviewed in the HPI.  All other systems are negative.  Physical Exam: BP 126/62  Pulse 62  Ht 5' 5.5" (1.664 m)  Wt 124 lb (56.246 kg)  BMI 20.31 kg/m2 The patient is alert and oriented x 3.  The mood and affect are normal.   Skin: warm and dry.  She is very weak.    HEENT:   Normal carotids, no JVD Lungs: clear  Heart: RR, no murmurs   Abdomen: soft, good BS Extremities:  No leg edema Neuro:  Generally improving ECG: Feb. 9, 2015:  NSR at 64.  No ST or T wave changes.   Assessment /  Plan:

## 2013-08-04 ENCOUNTER — Encounter (INDEPENDENT_AMBULATORY_CARE_PROVIDER_SITE_OTHER): Payer: Self-pay | Admitting: Internal Medicine

## 2013-08-04 ENCOUNTER — Ambulatory Visit (INDEPENDENT_AMBULATORY_CARE_PROVIDER_SITE_OTHER): Payer: Medicare Other | Admitting: Internal Medicine

## 2013-08-04 VITALS — BP 100/52 | HR 64 | Temp 97.7°F | Ht 65.5 in | Wt 126.5 lb

## 2013-08-04 DIAGNOSIS — N390 Urinary tract infection, site not specified: Secondary | ICD-10-CM

## 2013-08-04 DIAGNOSIS — R109 Unspecified abdominal pain: Secondary | ICD-10-CM

## 2013-08-04 DIAGNOSIS — D638 Anemia in other chronic diseases classified elsewhere: Secondary | ICD-10-CM

## 2013-08-04 LAB — BASIC METABOLIC PANEL
BUN: 38 mg/dL — AB (ref 6–23)
CO2: 22 mEq/L (ref 19–32)
Calcium: 9.6 mg/dL (ref 8.4–10.5)
Chloride: 100 mEq/L (ref 96–112)
Creat: 2.12 mg/dL — ABNORMAL HIGH (ref 0.50–1.10)
Glucose, Bld: 62 mg/dL — ABNORMAL LOW (ref 70–99)
POTASSIUM: 5 meq/L (ref 3.5–5.3)
Sodium: 135 mEq/L (ref 135–145)

## 2013-08-04 LAB — CBC
HCT: 33.1 % — ABNORMAL LOW (ref 36.0–46.0)
Hemoglobin: 10.7 g/dL — ABNORMAL LOW (ref 12.0–15.0)
MCH: 32.8 pg (ref 26.0–34.0)
MCHC: 32.3 g/dL (ref 30.0–36.0)
MCV: 101.5 fL — AB (ref 78.0–100.0)
Platelets: 415 10*3/uL — ABNORMAL HIGH (ref 150–400)
RBC: 3.26 MIL/uL — AB (ref 3.87–5.11)
RDW: 16 % — ABNORMAL HIGH (ref 11.5–15.5)
WBC: 11.6 10*3/uL — AB (ref 4.0–10.5)

## 2013-08-04 NOTE — Patient Instructions (Signed)
Labs today

## 2013-08-04 NOTE — Progress Notes (Signed)
Subjective:     Patient ID: [name], female   DOB: 12/17/1940, 73 y.o.   MRN: [id number]  [id number] yr old female here today for f/u after recent admission to hospital. Admitted x 2 days.  Hx of chronic kidney disease and chronic systolic heart failure. Admitted for a UTI. She was also dehydrated. She was evaluated by Nephrology. Hx of anemia.  Urine culture grew Klebsiella. She was discharged with an Rx for Ceftin x 3 day. She has finished the antibiotic. She was also d/c with po potassium.  Hx of GERD.  She tells me she is weak but is doing better. She tells me she voided about 500 cc of clear urine. She says  he urine did have a smell.  Her appetite is good.  She has gained 5 pounds since her last visit in 2013. Her bowels move regularly.  Saw Dr. Lovell SheehanJenkins while in hospital for placement of femoral line.Has seen  Dr. Caryn SectionFox for anemia/chronic kidney disease and periodically has iron infusions. Dr. Caryn SectionFox has now retired. She was evaluated by Dr. Fausto SkillernBefakadu while in the hospital this last admission.  Requesting to have her Hydrocodone and Xanax amt increased.  CBC    Component Value Date/Time   WBC 11.1* 07/29/2013 0608   RBC 2.53* 07/29/2013 0608   RBC 3.22* 12/18/2009 1326   HGB 8.8* 07/29/2013 0608   HCT 24.9* 07/29/2013 0608   PLT 201 07/29/2013 0608   MCV 98.4 07/29/2013 0608   MCH 34.8* 07/29/2013 0608   MCHC 35.3 07/29/2013 0608   RDW 17.3* 07/29/2013 0608   LYMPHSABS 1.8 07/26/2013 2033   MONOABS 1.7* 07/26/2013 2033   EOSABS 0.0 07/26/2013 2033   BASOSABS 0.0 07/26/2013 2033    BMET    Component Value Date/Time   NA 144 07/29/2013 0608   K 3.2* 07/29/2013 0608   CL 114* 07/29/2013 0608   CO2 17* 07/29/2013 0608   GLUCOSE 96 07/29/2013 0608   BUN 36* 07/29/2013 0608   CREATININE 1.53* 07/29/2013 0608   CREATININE 2.51* 07/09/2011 1215   CALCIUM 7.7* 07/29/2013 0608   CALCIUM 9.1 07/27/2013 1141   GFRNONAA 33* 07/29/2013 0608   GFRAA 38* 07/29/2013 0608               Review of Systemssee hpi Current  Outpatient Prescriptions  Medication Sig Dispense Refill  . alendronate (FOSAMAX) 70 MG tablet Take 70 mg by mouth every 7 (seven) days. Take with a full glass of water on an empty stomach. Takes on Sunday.      . ALPRAZolam (XANAX) 0.5 MG tablet Take 1 tablet (0.5 mg total) by mouth 3 (three) times daily.  60 tablet  0  . aspirin EC 81 MG tablet Take 81 mg by mouth daily.      . calcium citrate-vitamin D 200-200 MG-UNIT TABS Take 1 tablet by mouth daily.        . carvedilol (COREG) 25 MG tablet Take 25 mg by mouth 2 (two) times daily.      Marland Kitchen. doxepin (SINEQUAN) 50 MG capsule Take 50 mg by mouth at bedtime.      . furosemide (LASIX) 40 MG tablet Take 40 mg by mouth once a week.       Marland Kitchen. HYDROcodone-acetaminophen (NORCO/VICODIN) 5-325 MG per tablet Take 1 tablet by mouth every 8 (eight) hours as needed. pain      . Multiple Vitamin (MULTIVITAMIN) tablet Take 1 tablet by mouth daily.        .Marland Kitchen  nitroGLYCERIN (NITROSTAT) 0.4 MG SL tablet Place 1 tablet (0.4 mg total) under the tongue every 5 (five) minutes as needed. For chest pain  25 tablet  5  . sodium bicarbonate 650 MG tablet Take 1,300 mg by mouth 2 (two) times daily.        No current facility-administered medications for this visit.   Past Medical History  Diagnosis Date  . Chronic kidney disease   . Gastroesophageal reflux   . Chronic UTI   . Hydronephrosis     She has chronic mild left   . AV fistula     clotted in her left forearm  . Cellulitis   . Pneumothorax     secondary to central line placement  . Single kidney     post right nephrectomy  . Myocardial infarction 2011; 2012  . Numbness and tingling of both legs   . Anginal pain   . Heart murmur   . Dysrhythmia     "heart beats real fast"  . Chronic anemia   . Pneumonia     "one time"  . Chronic chest pain 01/14/12    1-2 X/day/H&P  . Arthritis     "at different places"  . Anxiety   . Depression   . Headache(784.0)     "alot; not daily"  . Frequent falls   .  Dementia   . Collagen vascular disease   . Renal insufficiency   . S/P ileal conduit     post cystectomy/ileal conduit  . Anemia     Received [facility] (x[facility] 2 at [facility]  . Iron deficiency     Received IV Feraheme (x) 2 at [facility].  . Dysarthria 03/31/13    Speech is almost a little bit dysarthritic  . Unsteady     Stronger since she came out of nursing home & is walking with a cane  . Edema 03/31/13    Gained 9 pounds overnight & telephone nurse instructed her to take 20mg  of Lasix bid on 03/30/13. Still has swelling in legs.  . Metabolic acidosis     Chronic  . Hypertension     Good control  . CHF (congestive heart failure)     Systolic heart failure EF 25-30% by ECHO 2012  . Altered mental state     Recurrent episodes, with confusion variously related to narcotic use, infection, etc. and has required hospitilization, nursing home stays for rehab related to weakness, encephalopathy, etc.  . History of sepsis 2012    With E.Coli bacteremia  . E coli bacteremia 2012    History of sepsis & E.Coli bacteremia  . Secondary hyperparathyroidism     Dr. Caryn Section had a sestamibi scan of her neck in May 2014 which was negative for parathyroid activity in her neck.   Past Surgical History  Procedure Laterality Date  . Hip arthroplasty      left; S/P fracture  . Rod left leg  2012    left hip "to almost my knee; for my hip"  . Creation / revision of ileostomy / jejunostomy  1973  . Nephrectomy  1973    right  . Fracture surgery    . Partial colectomy  1970  . Tonsillectomy and adenoidectomy      "when I was a kid"  . Cholecystectomy  2000  . Appendectomy  2000  . Abdominal hysterectomy    . Dilation and curettage of uterus    . Cesarean section  1969  only 4th of 4 children was c-section  . Av fistula placement  1970's    LFA  . Bladder removal  1974    with ileal conduit and urostomy   Allergies  Allergen Reactions  . Ketorolac Tromethamine Rash        Objective:    Physical Exam  Filed Vitals:   08/04/13 0904  BP: 100/52  Pulse: 64  Temp: 97.7 F (36.5 C)  Height: 5' 5.5" (1.664 m)  Weight: 126 lb 8 oz (57.38 kg)  Alert and oriented. Skin warm and dry. Oral mucosa is dry  . Sclera anicteric, conjunctivae is pink. Thyroid not enlarged. No cervical lymphadenopathy. Lungs clear. Heart regular rate and rhythm.  Abdomen is soft. Bowel sounds are positive. No hepatomegaly. No abdominal masses felt. No tenderness.  No edema to lower extremities.   Multiple abdominal scars.  Urostomy in place draining clear yellow urine.      Assessment:    Chronic abdominal pain. I spoke with Dr. Karilyn Cota. Will not increase amt of her pain medication or xanax and I agree    Plan:    CBC, Bmet today. OV in 6 months. Follow up with Nephrologist 08/11/2013

## 2013-08-11 ENCOUNTER — Ambulatory Visit (INDEPENDENT_AMBULATORY_CARE_PROVIDER_SITE_OTHER): Payer: Medicare Other | Admitting: Internal Medicine

## 2013-08-18 ENCOUNTER — Other Ambulatory Visit: Payer: Self-pay | Admitting: Cardiovascular Disease

## 2013-08-28 ENCOUNTER — Other Ambulatory Visit (INDEPENDENT_AMBULATORY_CARE_PROVIDER_SITE_OTHER): Payer: Self-pay | Admitting: Internal Medicine

## 2013-08-28 DIAGNOSIS — K219 Gastro-esophageal reflux disease without esophagitis: Secondary | ICD-10-CM

## 2013-08-28 MED ORDER — ALPRAZOLAM 0.5 MG PO TABS: 0.5000 mg | ORAL_TABLET | Freq: Three times a day (TID) | ORAL | Status: DC

## 2013-09-15 ENCOUNTER — Emergency Department (HOSPITAL_COMMUNITY): Payer: Medicare Other

## 2013-09-15 ENCOUNTER — Inpatient Hospital Stay (HOSPITAL_COMMUNITY)
Admission: EM | Admit: 2013-09-15 | Discharge: 2013-09-18 | DRG: 885 | Disposition: A | Discharge status: Skilled Nursing Facility | Payer: Medicare Other | Attending: Internal Medicine | Admitting: Internal Medicine

## 2013-09-15 ENCOUNTER — Encounter (HOSPITAL_COMMUNITY): Payer: Self-pay | Admitting: Emergency Medicine

## 2013-09-15 DIAGNOSIS — R1032 Left lower quadrant pain: Secondary | ICD-10-CM

## 2013-09-15 DIAGNOSIS — F19939 Other psychoactive substance use, unspecified with withdrawal, unspecified: Secondary | ICD-10-CM | POA: Diagnosis present

## 2013-09-15 DIAGNOSIS — F419 Anxiety disorder, unspecified: Secondary | ICD-10-CM | POA: Diagnosis present

## 2013-09-15 DIAGNOSIS — E860 Dehydration: Secondary | ICD-10-CM

## 2013-09-15 DIAGNOSIS — W19XXXA Unspecified fall, initial encounter: Secondary | ICD-10-CM

## 2013-09-15 DIAGNOSIS — K219 Gastro-esophageal reflux disease without esophagitis: Secondary | ICD-10-CM

## 2013-09-15 DIAGNOSIS — F29 Unspecified psychosis not due to a substance or known physiological condition: Principal | ICD-10-CM | POA: Diagnosis present

## 2013-09-15 DIAGNOSIS — Z9089 Acquired absence of other organs: Secondary | ICD-10-CM

## 2013-09-15 DIAGNOSIS — Z8249 Family history of ischemic heart disease and other diseases of the circulatory system: Secondary | ICD-10-CM

## 2013-09-15 DIAGNOSIS — N189 Chronic kidney disease, unspecified: Secondary | ICD-10-CM

## 2013-09-15 DIAGNOSIS — F32A Depression, unspecified: Secondary | ICD-10-CM | POA: Diagnosis present

## 2013-09-15 DIAGNOSIS — I509 Heart failure, unspecified: Secondary | ICD-10-CM

## 2013-09-15 DIAGNOSIS — I10 Essential (primary) hypertension: Secondary | ICD-10-CM

## 2013-09-15 DIAGNOSIS — F1320 Sedative, hypnotic or anxiolytic dependence, uncomplicated: Secondary | ICD-10-CM | POA: Diagnosis present

## 2013-09-15 DIAGNOSIS — G929 Unspecified toxic encephalopathy: Secondary | ICD-10-CM

## 2013-09-15 DIAGNOSIS — G8929 Other chronic pain: Secondary | ICD-10-CM

## 2013-09-15 DIAGNOSIS — D72829 Elevated white blood cell count, unspecified: Secondary | ICD-10-CM

## 2013-09-15 DIAGNOSIS — R252 Cramp and spasm: Secondary | ICD-10-CM | POA: Diagnosis present

## 2013-09-15 DIAGNOSIS — Z905 Acquired absence of kidney: Secondary | ICD-10-CM

## 2013-09-15 DIAGNOSIS — Z634 Disappearance and death of family member: Secondary | ICD-10-CM

## 2013-09-15 DIAGNOSIS — I5022 Chronic systolic (congestive) heart failure: Secondary | ICD-10-CM | POA: Diagnosis present

## 2013-09-15 DIAGNOSIS — G92 Toxic encephalopathy: Secondary | ICD-10-CM

## 2013-09-15 DIAGNOSIS — N183 Chronic kidney disease, stage 3 unspecified: Secondary | ICD-10-CM

## 2013-09-15 DIAGNOSIS — E871 Hypo-osmolality and hyponatremia: Secondary | ICD-10-CM | POA: Diagnosis present

## 2013-09-15 DIAGNOSIS — G3184 Mild cognitive impairment, so stated: Secondary | ICD-10-CM | POA: Diagnosis present

## 2013-09-15 DIAGNOSIS — Z87891 Personal history of nicotine dependence: Secondary | ICD-10-CM

## 2013-09-15 DIAGNOSIS — K5900 Constipation, unspecified: Secondary | ICD-10-CM | POA: Diagnosis present

## 2013-09-15 DIAGNOSIS — E872 Acidosis, unspecified: Secondary | ICD-10-CM

## 2013-09-15 DIAGNOSIS — N289 Disorder of kidney and ureter, unspecified: Secondary | ICD-10-CM

## 2013-09-15 DIAGNOSIS — M546 Pain in thoracic spine: Secondary | ICD-10-CM

## 2013-09-15 DIAGNOSIS — IMO0002 Reserved for concepts with insufficient information to code with codable children: Secondary | ICD-10-CM | POA: Diagnosis present

## 2013-09-15 DIAGNOSIS — N1330 Unspecified hydronephrosis: Secondary | ICD-10-CM | POA: Diagnosis present

## 2013-09-15 DIAGNOSIS — F329 Major depressive disorder, single episode, unspecified: Secondary | ICD-10-CM | POA: Diagnosis present

## 2013-09-15 DIAGNOSIS — Z936 Other artificial openings of urinary tract status: Secondary | ICD-10-CM

## 2013-09-15 DIAGNOSIS — N390 Urinary tract infection, site not specified: Secondary | ICD-10-CM

## 2013-09-15 DIAGNOSIS — I252 Old myocardial infarction: Secondary | ICD-10-CM

## 2013-09-15 DIAGNOSIS — I129 Hypertensive chronic kidney disease with stage 1 through stage 4 chronic kidney disease, or unspecified chronic kidney disease: Secondary | ICD-10-CM | POA: Diagnosis present

## 2013-09-15 DIAGNOSIS — Z602 Problems related to living alone: Secondary | ICD-10-CM

## 2013-09-15 DIAGNOSIS — R531 Weakness: Secondary | ICD-10-CM

## 2013-09-15 DIAGNOSIS — F1920 Other psychoactive substance dependence, uncomplicated: Secondary | ICD-10-CM | POA: Diagnosis present

## 2013-09-15 DIAGNOSIS — F411 Generalized anxiety disorder: Secondary | ICD-10-CM | POA: Diagnosis present

## 2013-09-15 DIAGNOSIS — G9349 Other encephalopathy: Secondary | ICD-10-CM | POA: Diagnosis present

## 2013-09-15 DIAGNOSIS — D509 Iron deficiency anemia, unspecified: Secondary | ICD-10-CM

## 2013-09-15 DIAGNOSIS — Z9181 History of falling: Secondary | ICD-10-CM

## 2013-09-15 DIAGNOSIS — G9340 Encephalopathy, unspecified: Secondary | ICD-10-CM | POA: Diagnosis present

## 2013-09-15 DIAGNOSIS — G4700 Insomnia, unspecified: Secondary | ICD-10-CM

## 2013-09-15 DIAGNOSIS — D649 Anemia, unspecified: Secondary | ICD-10-CM

## 2013-09-15 DIAGNOSIS — N179 Acute kidney failure, unspecified: Secondary | ICD-10-CM

## 2013-09-15 DIAGNOSIS — E876 Hypokalemia: Secondary | ICD-10-CM

## 2013-09-15 DIAGNOSIS — Y92009 Unspecified place in unspecified non-institutional (private) residence as the place of occurrence of the external cause: Secondary | ICD-10-CM

## 2013-09-15 DIAGNOSIS — F0390 Unspecified dementia without behavioral disturbance: Secondary | ICD-10-CM

## 2013-09-15 DIAGNOSIS — N2581 Secondary hyperparathyroidism of renal origin: Secondary | ICD-10-CM | POA: Diagnosis present

## 2013-09-15 HISTORY — DX: Chronic kidney disease, stage 3 unspecified: N18.30

## 2013-09-15 HISTORY — DX: Chronic kidney disease, stage 3 (moderate): N18.3

## 2013-09-15 LAB — CBC
HCT: 35.3 % — ABNORMAL LOW (ref 36.0–46.0)
HEMOGLOBIN: 12.5 g/dL (ref 12.0–15.0)
MCH: 34.3 pg — AB (ref 26.0–34.0)
MCHC: 35.4 g/dL (ref 30.0–36.0)
MCV: 97 fL (ref 78.0–100.0)
Platelets: 243 10*3/uL (ref 150–400)
RBC: 3.64 MIL/uL — AB (ref 3.87–5.11)
RDW: 13.6 % (ref 11.5–15.5)
WBC: 9 10*3/uL (ref 4.0–10.5)

## 2013-09-15 LAB — URINALYSIS, ROUTINE W REFLEX MICROSCOPIC
BILIRUBIN URINE: NEGATIVE
Glucose, UA: NEGATIVE mg/dL
Ketones, ur: NEGATIVE mg/dL
NITRITE: NEGATIVE
Protein, ur: NEGATIVE mg/dL
Specific Gravity, Urine: 1.009 (ref 1.005–1.030)
UROBILINOGEN UA: 0.2 mg/dL (ref 0.0–1.0)
pH: 6.5 (ref 5.0–8.0)

## 2013-09-15 LAB — COMPREHENSIVE METABOLIC PANEL
ALT: 17 U/L (ref 0–35)
AST: 20 U/L (ref 0–37)
Albumin: 3.5 g/dL (ref 3.5–5.2)
Alkaline Phosphatase: 61 U/L (ref 39–117)
BUN: 23 mg/dL (ref 6–23)
CO2: 23 meq/L (ref 19–32)
Calcium: 9.5 mg/dL (ref 8.4–10.5)
Chloride: 87 mEq/L — ABNORMAL LOW (ref 96–112)
Creatinine, Ser: 1.37 mg/dL — ABNORMAL HIGH (ref 0.50–1.10)
GFR calc Af Amer: 43 mL/min — ABNORMAL LOW (ref >90)
GFR calc non Af Amer: 37 mL/min — ABNORMAL LOW (ref >90)
GLUCOSE: 99 mg/dL (ref 70–99)
POTASSIUM: 3 meq/L — AB (ref 3.7–5.3)
Sodium: 128 mEq/L — ABNORMAL LOW (ref 137–147)
Total Bilirubin: 0.4 mg/dL (ref 0.3–1.2)
Total Protein: 7.5 g/dL (ref 6.0–8.3)

## 2013-09-15 LAB — RAPID URINE DRUG SCREEN, HOSP PERFORMED
Amphetamines: NOT DETECTED
BARBITURATES: NOT DETECTED
BENZODIAZEPINES: NOT DETECTED
Cocaine: NOT DETECTED
Opiates: NOT DETECTED
Tetrahydrocannabinol: NOT DETECTED

## 2013-09-15 LAB — URINE MICROSCOPIC-ADD ON

## 2013-09-15 LAB — CBG MONITORING, ED: Glucose-Capillary: 120 mg/dL — ABNORMAL HIGH (ref 70–99)

## 2013-09-15 LAB — LACTIC ACID, PLASMA: LACTIC ACID, VENOUS: 1 mmol/L (ref 0.5–2.2)

## 2013-09-15 LAB — AMMONIA: AMMONIA: 33 umol/L (ref 11–60)

## 2013-09-15 MED ORDER — ASPIRIN EC 81 MG PO TBEC
81.0000 mg | DELAYED_RELEASE_TABLET | Freq: Every day | ORAL | Status: DC
  Administered 2013-09-16 – 2013-09-18 (×3): 81 mg via ORAL
  Filled 2013-09-15 (×3): qty 1

## 2013-09-15 MED ORDER — ACETAMINOPHEN 325 MG PO TABS
650.0000 mg | ORAL_TABLET | Freq: Four times a day (QID) | ORAL | Status: DC | PRN
  Administered 2013-09-16: 650 mg via ORAL
  Filled 2013-09-15: qty 2

## 2013-09-15 MED ORDER — SODIUM CHLORIDE 0.9 % IJ SOLN
3.0000 mL | Freq: Two times a day (BID) | INTRAMUSCULAR | Status: DC
  Administered 2013-09-15 – 2013-09-17 (×2): 3 mL via INTRAVENOUS

## 2013-09-15 MED ORDER — ALPRAZOLAM 0.5 MG PO TABS
0.5000 mg | ORAL_TABLET | Freq: Three times a day (TID) | ORAL | Status: DC
  Administered 2013-09-15 – 2013-09-17 (×6): 0.5 mg via ORAL
  Filled 2013-09-15 (×6): qty 1

## 2013-09-15 MED ORDER — ONDANSETRON HCL 4 MG PO TABS
4.0000 mg | ORAL_TABLET | Freq: Four times a day (QID) | ORAL | Status: DC | PRN
Start: 2013-09-15 — End: 2013-09-18

## 2013-09-15 MED ORDER — ACETAMINOPHEN 650 MG RE SUPP: 650.0000 mg | Freq: Four times a day (QID) | RECTAL | Status: DC | PRN

## 2013-09-15 MED ORDER — CALCITRIOL 0.5 MCG PO CAPS
0.5000 ug | ORAL_CAPSULE | Freq: Every day | ORAL | Status: DC
  Administered 2013-09-15 – 2013-09-18 (×4): 0.5 ug via ORAL
  Filled 2013-09-15 (×4): qty 1

## 2013-09-15 MED ORDER — HALOPERIDOL LACTATE 5 MG/ML IJ SOLN: 5.0000 mg | Freq: Four times a day (QID) | INTRAMUSCULAR | Status: DC | PRN

## 2013-09-15 MED ORDER — SODIUM CHLORIDE 0.9 % IV SOLN
INTRAVENOUS | Status: DC
  Administered 2013-09-15 – 2013-09-16 (×2): via INTRAVENOUS
  Filled 2013-09-15 (×3): qty 1000

## 2013-09-15 MED ORDER — ACETAMINOPHEN 325 MG PO TABS
650.0000 mg | ORAL_TABLET | Freq: Once | ORAL | Status: AC
  Administered 2013-09-15: 650 mg via ORAL
  Filled 2013-09-15: qty 2

## 2013-09-15 MED ORDER — CYCLOBENZAPRINE HCL 10 MG PO TABS
10.0000 mg | ORAL_TABLET | Freq: Once | ORAL | Status: AC
  Administered 2013-09-16: 10 mg via ORAL
  Filled 2013-09-15: qty 1

## 2013-09-15 MED ORDER — ONDANSETRON HCL 4 MG/2ML IJ SOLN: 4.0000 mg | Freq: Four times a day (QID) | INTRAMUSCULAR | Status: DC | PRN

## 2013-09-15 MED ORDER — ENOXAPARIN SODIUM 40 MG/0.4ML ~~LOC~~ SOLN
40.0000 mg | SUBCUTANEOUS | Status: DC
  Administered 2013-09-15: 40 mg via SUBCUTANEOUS
  Filled 2013-09-15 (×2): qty 0.4

## 2013-09-15 MED ORDER — DIPHENHYDRAMINE HCL 50 MG PO CAPS
50.0000 mg | ORAL_CAPSULE | Freq: Once | ORAL | Status: AC
  Administered 2013-09-15: 50 mg via ORAL
  Filled 2013-09-15: qty 1

## 2013-09-15 MED ORDER — POTASSIUM CHLORIDE CRYS ER 20 MEQ PO TBCR
20.0000 meq | EXTENDED_RELEASE_TABLET | Freq: Once | ORAL | Status: AC
  Administered 2013-09-15: 20 meq via ORAL
  Filled 2013-09-15: qty 1

## 2013-09-15 MED ORDER — CARVEDILOL 25 MG PO TABS
25.0000 mg | ORAL_TABLET | Freq: Two times a day (BID) | ORAL | Status: DC
  Administered 2013-09-15 – 2013-09-18 (×6): 25 mg via ORAL
  Filled 2013-09-15 (×8): qty 1

## 2013-09-15 NOTE — H&P (Signed)
[facility] History and Physical  [name] ZOX:[id number] DOB: [birth year] DOA: 09/15/2013   PCP: Malissa Hippo, MD   Chief Complaint: Aggitation, confusion  HPI:  73 year old female with history of dementia, chronic systolic heart failure with improved cardiac function in 2013, hypertension, chronic kidney disease, nephrectomy, bladder resection with right urostomy and ileal conduit, who lives by herself presented to ED with one week history of increasing confusion. The patient's husband died approximately 12 days ago, and since then the patient has had increasing confusion, agitation, and hallucinations. As a result, the patient's daughters had been staying with her since the death of the patient's husband. From the advice of her nephrologist, the patient was brought to the ED for further evaluation today because of the agitation. There has been no reports of fevers, chest discomfort, shortness of breath, vomiting, diarrhea. The patient is unable to provide any history due to her confusion. All of this history is obtained from review of the chart and speaking with the patient's daughters at the bedside. There has not been any new medications, but the patient has been dispensing her own medications since the death of her husband. Her husband previously dispensed medications for the patient. The daughters expressed concern that the patient took an abundance of her Xanax during the funeral, and has run out of Xanax as a result. The patient has not had any Xanax for 5 days. In addition, the patient has of her doxepin for the past 5 days. The daughters expressed concern that the patient has taken extra Xanax in the past. The patient has not had any syncope, but she had a mechanical fall a few days prior to admission as she was getting out of bed. There is no history of decreased by mouth intake and increasing confusion for the past week. In the ED, the patient was agitated and continuously  complained of leg cramps. She was hemodynamically stable and afebrile. Sodium was 128, potassium 3.0, serum creatinine 1.37. Hemoglobin of 12.5, WBC 9.0. Hepatic panel was negative. Lactic acid was 1.0. CT of the brain was negative for any acute findings. Chest x-ray was negative. EKG was sinus rhythm without any ST-T wave changes.    Assessment/Plan: Acute encephalopathy -Multifactorial including hyponatremia, dehydration, and possibly benzodiazepine withdrawal -Urinalysis was negative for pyuria -Urine drug screen -TSH, serum B12, ammonia, RPR, RBC folate -Urine cultures are unreliable in the setting of ileal conduit Hyponatremia -This is likely volume depletion given history of decreased po intake and hemoconcentration (baseline Hgb= 10) -IV fluids CKD stage III -Baseline creatinine 1.3-1.6 -Patient has history of nephrectomy -Continue calcitriol although not on med rec, pt has bottle of pills Chronic systolic CHF -Stable -EF 55-60%   hypertension  -Continue carvedilol  -Hold amlodipine 5mg --although not on med rec--pt has recent refill Metabolic acidosis -Likely due to the patient's chronic renal failure -Continue bicarbonate Leg pain/cramping -duplex to r/o DVT      Past Medical History  Diagnosis Date  . Chronic kidney disease   . Gastroesophageal reflux   . Chronic UTI   . Hydronephrosis     She has chronic mild left   . AV fistula     clotted in her left forearm  . Cellulitis   . Pneumothorax     secondary to central line placement  . Single kidney     post right nephrectomy  . Myocardial infarction 2011; 2012  . Numbness and tingling of both legs   . Anginal pain   . Heart  murmur   . Dysrhythmia     "heart beats real fast"  . Chronic anemia   . Pneumonia     "one time"  . Chronic chest pain 01/14/12    1-2 X/day/H&P  . Arthritis     "at different places"  . Anxiety   . Depression   . Headache(784.0)     "alot; not daily"  . Frequent falls   .  Dementia   . Collagen vascular disease   . Renal insufficiency   . S/P ileal conduit     post cystectomy/ileal conduit  . Anemia     Received IV Feraheme (x) 2 at [facility]  . Iron deficiency     Received IV Fera[facility] (x) 2 at [facility].  . Dysarthria 03/31/13    Speech is almost a little bit dysarthritic  . Unsteady     Stronger since she came out of nursing home & is walking with a cane  . Edema 03/31/13    Gained 9 pounds overnight & telephone nurse instructed her to take 20mg  of Lasix bid on 03/30/13. Still has swelling in legs.  . Metabolic acidosis     Chronic  . Hypertension     Good control  . CHF (congestive heart failure)     Systolic heart failure EF 25-30% by ECHO 2012  . Altered mental state     Recurrent episodes, with confusion variously related to narcotic use, infection, etc. and has required hospitilization, nursing home stays for rehab related to weakness, encephalopathy, etc.  . History of sepsis 2012    With E.Coli bacteremia  . E coli bacteremia 2012    History of sepsis & E.Coli bacteremia  . Secondary hyperparathyroidism     Dr. Caryn Section had a sestamibi scan of her neck in May 2014 which was negative for parathyroid activity in her neck.   Past Surgical History  Procedure Laterality Date  . Hip arthroplasty      left; S/P fracture  . Rod left leg  2012    left hip "to almost my knee; for my hip"  . Creation / revision of ileostomy / jejunostomy  1973  . Nephrectomy  1973    right  . Fracture surgery    . Partial colectomy  1970  . Tonsillectomy and adenoidectomy      "when I was a kid"  . Cholecystectomy  2000  . Appendectomy  2000  . Abdominal hysterectomy    . Dilation and curettage of uterus    . Cesarean section  1969    only 4th of 4 children was c-section  . Av fistula placement  1970's    LFA  . Bladder removal  1974    with ileal conduit and urostomy   Social History:  reports that she quit smoking about 9 years ago. Her smoking use  included Cigarettes. She smoked 0.00 packs per day for 2 years. She has never used smokeless tobacco. She reports that she does not drink alcohol or use illicit drugs.   Family History  Problem Relation Age of Onset  . Heart attack Mother   . Healthy Daughter   . Healthy Daughter   . Healthy Son      Allergies  Allergen Reactions  . Ketorolac Tromethamine Rash      Prior to Admission medications   Medication Sig Start Date End Date Taking? Authorizing Provider  ALPRAZolam Prudy Feeler) 0.5 MG tablet Take 0.5 mg by mouth 3 (three) times daily as  needed for anxiety. 08/28/13  Yes Len Blalock, NP  carvedilol (COREG) 25 MG tablet Take 25 mg by mouth 2 (two) times daily.   Yes Historical Provider, MD  doxepin (SINEQUAN) 50 MG capsule Take 50 mg by mouth at bedtime.   Yes Historical Provider, MD  furosemide (LASIX) 40 MG tablet Take 40 mg by mouth once a week. Takes only for swelling   Yes Historical Provider, MD  aspirin EC 81 MG tablet Take 81 mg by mouth daily.    Historical Provider, MD  calcium citrate-vitamin D 200-200 MG-UNIT TABS Take 1 tablet by mouth daily.      Historical Provider, MD  HYDROcodone-acetaminophen (NORCO/VICODIN) 5-325 MG per tablet Take 1 tablet by mouth every 8 (eight) hours as needed. pain    Historical Provider, MD  Multiple Vitamin (MULTIVITAMIN) tablet Take 1 tablet by mouth daily.      Historical Provider, MD  nitroGLYCERIN (NITROSTAT) 0.4 MG SL tablet Place 1 tablet (0.4 mg total) under the tongue every 5 (five) minutes as needed. For chest pain 06/16/12   Vesta Mixer, MD  sodium bicarbonate 650 MG tablet Take 650 mg by mouth daily.     Historical Provider, MD    Review of Systems:  Unobtainable secondary to patient's acute encephalopathy Physical Exam: Filed Vitals:   09/15/13 1438 09/15/13 1600 09/15/13 1703 09/15/13 1730  BP: 145/72 148/74 135/69   Pulse: 68 71 77 73  Temp: 98.8 F (37.1 C)     TempSrc: Oral     Resp: 16  15 15   SpO2: 100% 98%  100% 100%   General:  Alert and awak, NAD, nontoxic,aggitated, cooperative Head/Eye: No conjunctival hemorrhage, no icterus, [place]/AT, No nystagmus ENT:  No icterus,  No thrush, good dentition, no pharyngeal exudate Neck:  No masses, no lymphadenpathy, no meningismus CV:  RRR, no rub, no gallop, no S3 Lung:  CTAB, good air movement, no wheeze, no rhonchi Abdomen: soft/NT, +BS, nondistended, no peritoneal signs Ext: No cyanosis, No rashes, No petechiae, No lymphangitis, trace LE edema   Labs on Admission:  Basic Metabolic Panel:  Recent Labs Lab 09/15/13 1600  NA 128*  K 3.0*  CL 87*  CO2 23  GLUCOSE 99  BUN 23  CREATININE 1.37*  CALCIUM 9.5   Liver Function Tests:  Recent Labs Lab 09/15/13 1600  AST 20  ALT 17  ALKPHOS 61  BILITOT 0.4  PROT 7.5  ALBUMIN 3.5   No results found for this basename: LIPASE, AMYLASE,  in the last 168 hours No results found for this basename: AMMONIA,  in the last 168 hours CBC:  Recent Labs Lab 09/15/13 1600  WBC 9.0  HGB 12.5  HCT 35.3*  MCV 97.0  PLT 243   Cardiac Enzymes: No results found for this basename: CKTOTAL, CKMB, CKMBINDEX, TROPONINI,  in the last 168 hours BNP: No components found with this basename: POCBNP,  CBG:  Recent Labs Lab 09/15/13 1528  GLUCAP 120*    Radiological Exams on Admission: Dg Chest 2 View  09/15/2013   CLINICAL DATA:  Altered mental status.  History of dementia  EXAM: CHEST  2 VIEW  COMPARISON:  07/26/2013  FINDINGS: Normal heart size. Unremarkable upper mediastinal contours. Pulmonary hyperinflation without edema or consolidation. No effusion or pneumothorax. Exaggerated thoracic kyphosis.  IMPRESSION: No active cardiopulmonary disease.   Electronically Signed   By: Tiburcio Pea M.D.   On: 09/15/2013 16:41   Ct Head Wo Contrast  09/15/2013   CLINICAL  DATA:  Altered mental status.  Confusion.  EXAM: CT HEAD WITHOUT CONTRAST  TECHNIQUE: Contiguous axial images were obtained from the base  of the skull through the vertex without intravenous contrast.  COMPARISON:  CT HEAD W/O CM dated 07/26/2013  FINDINGS: Mild atrophy and diffuse white matter disease is similar to the prior exams. No acute cortical infarct, hemorrhage, or mass lesion is present. 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 The ventricles are otherwise of normal size. No significant extra-axial fluid collection is present. The paranasal sinuses and mastoid air cells are clear.  IMPRESSION: 1. Stable atrophy and white matter disease. 2. No acute intracranial abnormality or significant interval change.   Electronically Signed   By: Gennette Pachris  Mattern M.D.   On: 09/15/2013 16:51    EKG: Independently reviewed. Sinus rhythm, no ST-T wave change     Time spent:60 minutes Code Status:   FULL Family Communication:   Daughters at bedside   Mercia Dowe, DO  [facility] Pager [phone]  If 7PM-7AM, please contact night-coverage [email or web address] Password [email or web address] 09/15/2013, 6:22 PM

## 2013-09-15 NOTE — ED Notes (Signed)
Family brought pt to the ED for confusion and altered mental status, unsure of last seen normal. Daughter checks on pt daily and found her altered today. Pt states she feels out of it and foul smelling urine. ekg done at triage. Reports husband recently died and high stress levels.

## 2013-09-15 NOTE — ED Provider Notes (Signed)
CSN: [id number]     Arrival date & time 09/15/13  1434 History   First MD Initiated Contact with Patient 09/15/13 1510     Chief Complaint  Patient presents with  . Altered Mental Status     (Consider location/radiation/quality/duration/timing/severity/associated sxs/prior Treatment) Patient is a 73 y.o. female presenting with altered mental status. The history is provided by the patient.  Altered Mental Status Presenting symptoms: behavior changes, confusion and disorientation   Presenting symptoms: no partial responsiveness and no unresponsiveness   Severity:  Unable to specify Most recent episode:  More than 2 days ago Episode history:  Continuous Timing:  Constant Progression:  Worsening Chronicity:  New Context: not dementia, not homeless and not a nursing home resident   Context comment:  Pt has mild cognitive impairment at baseline, recently 1-2 weeks ago had death of her spouse and since then has been worse Associated symptoms: no abdominal pain, no fever, no rash and no vomiting     Past Medical History  Diagnosis Date  . Gastroesophageal reflux   . Chronic UTI   . AV fistula     clotted in her left forearm  . Cellulitis   . Pneumothorax     secondary to central line placement  . Myocardial infarction 2011; 2012  . Numbness and tingling of both legs   . Anginal pain   . Heart murmur   . Dysrhythmia     "heart beats real fast"  . Chronic anemia   . Pneumonia     "one time"  . Chronic chest pain 01/14/12    1-2 X/day/H&P  . Arthritis     "at different places"  . Anxiety   . Depression   . Headache(784.0)     "alot; not daily"  . Frequent falls   . Dementia   . Collagen vascular disease   . S/P ileal conduit     post cystectomy/ileal conduit  . Anemia     Received IV Feraheme (x) 2 at [facility]  . Iron deficiency     Received IV Feraheme (x) 2 at [facility]  . Dysarthria 03/31/13    Speech is almost a little bit dysarthritic  . Unsteady    Stronger since she came out of nursing home & is walking with a cane  . Edema 03/31/13    Gained 9 pounds overnight & telephone nurse instructed her to take 20mg  of Lasix bid on 03/30/13. Still has swelling in legs.  . Metabolic acidosis     Chronic  . Hypertension     Good control  . CHF (congestive heart failure)     Systolic heart failure EF 25-30% by ECHO 2012  . Altered mental state     Recurrent episodes, with confusion variously related to narcotic use, infection, etc. and has required hospitilization, nursing home stays for rehab related to weakness, encephalopathy, etc.  . History of sepsis 2012    With E.Coli bacteremia  . E coli bacteremia 2012    History of sepsis & E.Coli bacteremia  . Secondary hyperparathyroidism     Dr. Caryn Section had a sestamibi scan of her neck in May 2014 which was negative for parathyroid activity in her neck.  . Chronic kidney disease (CKD), stage III (moderate)     [name] 09/15/2013  . Hydronephrosis     She has chronic mild left   . Single kidney     post right nephrectomy  . Renal insufficiency    Past Surgical History  Procedure Laterality Date  . Hip arthroplasty      left; S/P fracture  . Rod left leg  2012    left hip "to almost my knee; for my hip"  . Creation / revision of ileostomy / jejunostomy  1973  . Nephrectomy  1973    right  . Fracture surgery    . Partial colectomy  1970  . Tonsillectomy and adenoidectomy      "when I was a kid"  . Cholecystectomy  2000  . Appendectomy  2000  . Abdominal hysterectomy    . Dilation and curettage of uterus    . Cesarean section  1969    only 4th of 4 children was c-section  . Av fistula placement  1970's    LFA  . Bladder removal  1974    with ileal conduit and urostomy   Family History  Problem Relation Age of Onset  . Heart attack Mother   . Healthy Daughter   . Healthy Daughter   . Healthy Son    History  Substance Use Topics  . Smoking status: Former Smoker -- 2 years    Types:  Cigarettes    Quit date: 06/25/2004  . Smokeless tobacco: Never Used     Comment: 09/15/2013  "just smoked one cigarette, once in awhile for ~ 2 years"  . Alcohol Use: No   OB History   Grav Para Term Preterm Abortions TAB SAB Ect Mult Living                 Review of Systems  Unable to perform ROS: Mental status change  Constitutional: Negative for fever.  Gastrointestinal: Negative for vomiting and abdominal pain.  Skin: Negative for rash.  Psychiatric/Behavioral: Positive for confusion.      Allergies  Ketorolac tromethamine  Home Medications   No current outpatient prescriptions on file. BP 133/60  Pulse 71  Temp(Src) 98.7 F (37.1 C) (Oral)  Resp 18  Ht 5\' 5"  (1.651 m)  Wt 112 lb 10.5 oz (51.1 kg)  BMI 18.75 kg/m2  SpO2 97% Physical Exam  Constitutional: She is oriented to person, place, and time. She appears well-developed and well-nourished. No distress.  Pt resting in bed, answers basic questions but cannot answer complex questions. Not oriented  HENT:  Head: Normocephalic and atraumatic.  Mouth/Throat: Oropharynx is clear and moist.  Eyes: Conjunctivae and EOM are normal. Pupils are equal, round, and reactive to light. Right eye exhibits no discharge. Left eye exhibits no discharge. No scleral icterus.  Neck: Normal range of motion. Neck supple.  Cardiovascular: Normal rate, regular rhythm and intact distal pulses.  Exam reveals no gallop and no friction rub.   No murmur heard. Pulmonary/Chest: Effort normal and breath sounds normal. No respiratory distress. She has no wheezes. She has no rales.  Abdominal: Soft. She exhibits no distension and no mass. There is no tenderness.  Musculoskeletal: Normal range of motion.  Neurological: She is alert and oriented to person, place, and time. No cranial nerve deficit. She exhibits normal muscle tone. Coordination normal.  Skin: She is not diaphoretic.    ED Course  Procedures (including critical care time) Labs  Review Labs Reviewed  CBC - Abnormal; Notable for the following:    RBC 3.64 (*)    HCT 35.3 (*)    MCH 34.3 (*)    All other components within normal limits  COMPREHENSIVE METABOLIC PANEL - Abnormal; Notable for the following:    Sodium 128 (*)  Potassium 3.0 (*)    Chloride 87 (*)    Creatinine, Ser 1.37 (*)    GFR calc non Af Amer 37 (*)    GFR calc Af Amer 43 (*)    All other components within normal limits  URINALYSIS, ROUTINE W REFLEX MICROSCOPIC - Abnormal; Notable for the following:    Hgb urine dipstick TRACE (*)    Leukocytes, UA MODERATE (*)    All other components within normal limits  URINE MICROSCOPIC-ADD ON - Abnormal; Notable for the following:    Bacteria, UA FEW (*)    All other components within normal limits  CBG MONITORING, ED - Abnormal; Notable for the following:    Glucose-Capillary 120 (*)    All other components within normal limits  URINE CULTURE  LACTIC ACID, PLASMA  BASIC METABOLIC PANEL  CBC  VITAMIN B12  FOLATE RBC  RPR  TSH  URINE RAPID DRUG SCREEN (HOSP PERFORMED)  AMMONIA  HIV ANTIBODY (ROUTINE TESTING)   Imaging Review Dg Chest 2 View  09/15/2013   CLINICAL DATA:  Altered mental status.  History of dementia  EXAM: CHEST  2 VIEW  COMPARISON:  07/26/2013  FINDINGS: Normal heart size. Unremarkable upper mediastinal contours. Pulmonary hyperinflation without edema or consolidation. No effusion or pneumothorax. Exaggerated thoracic kyphosis.  IMPRESSION: No active cardiopulmonary disease.   Electronically Signed   By: Tiburcio PeaJonathan  Watts M.D.   On: 09/15/2013 16:41   Ct Head Wo Contrast  09/15/2013   CLINICAL DATA:  Altered mental status.  Confusion.  EXAM: CT HEAD WITHOUT CONTRAST  TECHNIQUE: Contiguous axial images were obtained from the base of the skull through the vertex without intravenous contrast.  COMPARISON:  CT HEAD W/O CM dated 07/26/2013  FINDINGS: Mild atrophy and diffuse white matter disease is similar to the prior exams. No acute  cortical infarct, hemorrhage, or mass lesion is present. 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 The ventricles are otherwise of normal size. No significant extra-axial fluid collection is present. The paranasal sinuses and mastoid air cells are clear.  IMPRESSION: 1. Stable atrophy and white matter disease. 2. No acute intracranial abnormality or significant interval change.   Electronically Signed   By: Gennette Pachris  Mattern M.D.   On: 09/15/2013 16:51     EKG Interpretation None      MDM   MDM: [age] y.o. WF w/ PMHx of MI, CKD, CHF, HTNm, Sepsis w/ cc: of AMS. Normally has mild dementia, needs assistance with ADLs but is alert and oriented. 1-2 weeks ago husband died, and since then has been rapidly worsening. Not oriented, confusion, aggressive per family. Not complaining of any sxs. Pt with no complaints. Denies pain. No fevers at home. AFVSS, well appaering, NAD. Pt altered, not oriented. No localizing symptoms. Labs, urine, CXR, Head CT WNL. Pt remained HDS. Possibly psychosis from stress but high risk, will need inpatient workup. D/w inpatient team who will admit. Pt remained HDS while in ED. Care of case d/w my attending.  Final diagnoses:  Acute encephalopathy  Dehydration  Hypokalemia  Hyponatremia    Admit   Pilar Jarvisoug Moroni Nester, MD 09/15/13 [phone]

## 2013-09-15 NOTE — ED Notes (Signed)
[place], MD aware of pain in bil legs, lower back bil lower & hands bil

## 2013-09-15 NOTE — Progress Notes (Signed)
Pt complaining of leg cramps. On call NP notified, Flexeril ordered. Will continue to monitor.

## 2013-09-15 NOTE — ED Notes (Signed)
Floor unable to take report at this time.

## 2013-09-16 DIAGNOSIS — N189 Chronic kidney disease, unspecified: Secondary | ICD-10-CM

## 2013-09-16 DIAGNOSIS — N289 Disorder of kidney and ureter, unspecified: Secondary | ICD-10-CM

## 2013-09-16 DIAGNOSIS — I10 Essential (primary) hypertension: Secondary | ICD-10-CM

## 2013-09-16 DIAGNOSIS — M79609 Pain in unspecified limb: Secondary | ICD-10-CM

## 2013-09-16 LAB — RPR: RPR Ser Ql: NONREACTIVE

## 2013-09-16 LAB — BASIC METABOLIC PANEL
BUN: 21 mg/dL (ref 6–23)
CALCIUM: 9 mg/dL (ref 8.4–10.5)
CO2: 23 mEq/L (ref 19–32)
CREATININE: 1.43 mg/dL — AB (ref 0.50–1.10)
Chloride: 95 mEq/L — ABNORMAL LOW (ref 96–112)
GFR, EST AFRICAN AMERICAN: 41 mL/min — AB (ref >90)
GFR, EST NON AFRICAN AMERICAN: 35 mL/min — AB (ref >90)
Glucose, Bld: 91 mg/dL (ref 70–99)
Potassium: 3.4 mEq/L — ABNORMAL LOW (ref 3.7–5.3)
Sodium: 134 mEq/L — ABNORMAL LOW (ref 137–147)

## 2013-09-16 LAB — FOLATE RBC: RBC FOLATE: 598 ng/mL (ref >280)

## 2013-09-16 LAB — CBC
HCT: 33.9 % — ABNORMAL LOW (ref 36.0–46.0)
Hemoglobin: 11.8 g/dL — ABNORMAL LOW (ref 12.0–15.0)
MCH: 33.9 pg (ref 26.0–34.0)
MCHC: 34.8 g/dL (ref 30.0–36.0)
MCV: 97.4 fL (ref 78.0–100.0)
PLATELETS: 248 10*3/uL (ref 150–400)
RBC: 3.48 MIL/uL — ABNORMAL LOW (ref 3.87–5.11)
RDW: 13.6 % (ref 11.5–15.5)
WBC: 7 10*3/uL (ref 4.0–10.5)

## 2013-09-16 LAB — TSH: TSH: 0.821 u[IU]/mL (ref 0.350–4.500)

## 2013-09-16 LAB — VITAMIN B12: VITAMIN B 12: 612 pg/mL (ref 211–911)

## 2013-09-16 LAB — HIV ANTIBODY (ROUTINE TESTING W REFLEX): HIV: NONREACTIVE

## 2013-09-16 MED ORDER — DOXEPIN HCL 50 MG PO CAPS
50.0000 mg | ORAL_CAPSULE | Freq: Every day | ORAL | Status: DC
  Filled 2013-09-16: qty 1

## 2013-09-16 MED ORDER — ADULT MULTIVITAMIN W/MINERALS CH
1.0000 | ORAL_TABLET | Freq: Every day | ORAL | Status: DC
  Administered 2013-09-16 – 2013-09-18 (×3): 1 via ORAL
  Filled 2013-09-16 (×3): qty 1

## 2013-09-16 MED ORDER — CYCLOBENZAPRINE HCL 10 MG PO TABS
5.0000 mg | ORAL_TABLET | Freq: Three times a day (TID) | ORAL | Status: DC | PRN
  Administered 2013-09-16: 5 mg via ORAL
  Filled 2013-09-16: qty 1

## 2013-09-16 MED ORDER — POLYETHYLENE GLYCOL 3350 17 G PO PACK
17.0000 g | PACK | Freq: Three times a day (TID) | ORAL | Status: DC
  Filled 2013-09-16 (×3): qty 1

## 2013-09-16 MED ORDER — POTASSIUM CHLORIDE CRYS ER 20 MEQ PO TBCR
40.0000 meq | EXTENDED_RELEASE_TABLET | Freq: Once | ORAL | Status: AC
  Administered 2013-09-16: 40 meq via ORAL
  Filled 2013-09-16: qty 2

## 2013-09-16 MED ORDER — ENOXAPARIN SODIUM 30 MG/0.3ML ~~LOC~~ SOLN
30.0000 mg | SUBCUTANEOUS | Status: DC
  Administered 2013-09-16 – 2013-09-17 (×2): 30 mg via SUBCUTANEOUS
  Filled 2013-09-16 (×3): qty 0.3

## 2013-09-16 MED ORDER — POLYETHYLENE GLYCOL 3350 17 G PO PACK
17.0000 g | PACK | Freq: Three times a day (TID) | ORAL | Status: DC
  Administered 2013-09-17: 17 g via ORAL
  Filled 2013-09-16 (×3): qty 1

## 2013-09-16 MED ORDER — HYDROCODONE-ACETAMINOPHEN 5-325 MG PO TABS
1.0000 | ORAL_TABLET | Freq: Three times a day (TID) | ORAL | Status: DC | PRN
  Administered 2013-09-17: 1 via ORAL
  Filled 2013-09-16: qty 1

## 2013-09-16 MED ORDER — BOOST / RESOURCE BREEZE PO LIQD
1.0000 | ORAL | Status: DC
  Administered 2013-09-16 – 2013-09-17 (×2): 1 via ORAL

## 2013-09-16 MED ORDER — MIRTAZAPINE 7.5 MG PO TABS
7.5000 mg | ORAL_TABLET | Freq: Every day | ORAL | Status: DC
  Administered 2013-09-16: 7.5 mg via ORAL
  Filled 2013-09-16 (×2): qty 1

## 2013-09-16 MED ORDER — ESCITALOPRAM OXALATE 5 MG PO TABS
5.0000 mg | ORAL_TABLET | Freq: Every day | ORAL | Status: DC
  Administered 2013-09-17: 5 mg via ORAL
  Filled 2013-09-16 (×2): qty 1

## 2013-09-16 NOTE — Progress Notes (Addendum)
PROGRESS NOTE  Melody LOZOYA [name]:[id number] DOB: 1940/10/15 DOA: 09/15/2013 PCP: Malissa Hippo, MD  73 year old female with history of dementia, chronic systolic heart failure, hypertension, chronic kidney disease, nephrectomy, bladder resection with right urostomy and ileal conduit, who lives by herself presented to ED with one week history of increasing confusion. The patient's husband died approximately 12 days ago, and since then the patient has had increasing confusion, agitation, and hallucinations.  From the advice of her nephrologist, the patient was brought to the ED for further evaluation today because of the agitation.  The patient has been dispensing her own medications since the death of her husband. Her husband previously dispensed medications for the patient. The daughters expressed concern that the patient took an abundance of her Xanax during the funeral, and has run out of Xanax as a result. The patient has not had any Xanax for 5 days.  Assessment/Plan:  Acute encephalopathy/Psychosis -Multifactorial including bereavement, dehydration, and possibly benzodiazepine withdrawal  -Urinalysis was negative for pyuria,CT head negative,Urine drug screen is negative. -TSH, serum B12, ammonia, RPR, RBC folate are all normal -Urine cultures are unreliable in the setting of ileal conduit  - Much better today, crying easily. Still somewhat confused but overall better. - Psych consulted.  Anxiety and depression - Worsened with recent death of her spouse is 60 years - Ran out of Xanax for the past few days. - Resumed doxepin and Xanax, await psych consult.  Hyponatremia  -This is likely volume depletion given history of decreased po intake and hemoconcentration (baseline Hgb= 10)  -nearly resolved with IV fluids -monitor periodically  CKD stage III  -Baseline creatinine 1.3-1.6.  Currently at baseline. -Patient has history of nephrectomy, cystectomy and ileal conduit -Continue  calcitriol although not on med rec, pt has bottle of pills   Chronic systolic CHF  -Stable and compensated continue beta blocker. -EF 55-60%   Hypertension  -Continue carvedilol. BP wnl and stable. -Hold amlodipine 5mg --although not on med rec--pt has recent refill   Leg pain/cramping  -doppler u/s negative for DVT. -flexeril daily prn for spasms.  DVT Prophylaxis:  lovenox  Code Status: full Family Communication: Talked with daughter.   Disposition Plan: inpatient.  Family would like her to go to the [facility] in [place] after d/c if possible (she has been there before)   Consultants:  Psychiatry called 3/25.  Procedures:    Antibiotics: Anti-infectives   None        HPI/Subjective: Patient complaining of hand pain, back pain, abdominal pain.  Reports constipation.  Objective: Filed Vitals:   09/15/13 2000 09/15/13 2036 09/16/13 0432 09/16/13 1300  BP: 117/70 133/60 118/71 120/66  Pulse: 56 71 68 64  Temp:  98.7 F (37.1 C) 98.6 F (37 C) 99.7 F (37.6 C)  TempSrc:  Oral Oral Oral  Resp: 15 18 18 18   Height:  5\' 5"  (1.651 m)    Weight:  51.1 kg (112 lb 10.5 oz)    SpO2:  97% 98% 96%    Intake/Output Summary (Last 24 hours) at 09/16/13 1511 Last data filed at 09/16/13 0622  Gross per 24 hour  Intake 596.25 ml  Output   1565 ml  Net -968.75 ml   Filed Weights   09/15/13 2036  Weight: 51.1 kg (112 lb 10.5 oz)    Exam: General: thin, lying in bed, well developed.  Confused. HEENT:  PERR, EOMI, Anicteic Sclera, MMM. No pharyngeal erythema or exudates  Neck: Supple, no JVD, no masses  Cardiovascular: RRR, S1 S2 auscultated, no rubs, murmurs or gallops.   Respiratory: Clear to auscultation bilaterally with equal chest rise  Abdomen: firm, tender, nondistended, + bowel sounds. Multiple well healed scars. Extremities: warm dry without cyanosis clubbing or edema.  Skin: Without rashes exudates or nodules.   Psych: Patient confused, tearful, afraid,  not orientated to place or time.   Data Reviewed: Basic Metabolic Panel:  Recent Labs Lab 09/15/13 1600 09/16/13 0532  NA 128* 134*  K 3.0* 3.4*  CL 87* 95*  CO2 23 23  GLUCOSE 99 91  BUN 23 21  CREATININE 1.37* 1.43*  CALCIUM 9.5 9.0   Liver Function Tests:  Recent Labs Lab 09/15/13 1600  AST 20  ALT 17  ALKPHOS 61  BILITOT 0.4  PROT 7.5  ALBUMIN 3.5    Recent Labs Lab 09/15/13 2226  AMMONIA 33   CBC:  Recent Labs Lab 09/15/13 1600 09/16/13 0532  WBC 9.0 7.0  HGB 12.5 11.8*  HCT 35.3* 33.9*  MCV 97.0 97.4  PLT 243 248  CBG:  Recent Labs Lab 09/15/13 1528  GLUCAP 120*    Recent Results (from the past 240 hour(s))  URINE CULTURE     Status: None   Collection Time    09/15/13  3:53 PM      Result Value Ref Range Status   Specimen Description URINE, CLEAN CATCH   Final   Special Requests NONE   Final   Culture  Setup Time     Final   Value: 09/15/2013 21:43     Performed at [facility] Count PENDING   Incomplete   Culture     Final   Value: Culture reincubated for better growth     Performed at [facility]   Report Status PENDING   Incomplete     Studies: Dg Chest 2 View  09/15/2013   CLINICAL DATA:  Altered mental status.  History of dementia  EXAM: CHEST  2 VIEW  COMPARISON:  07/26/2013  FINDINGS: Normal heart size. Unremarkable upper mediastinal contours. Pulmonary hyperinflation without edema or consolidation. No effusion or pneumothorax. Exaggerated thoracic kyphosis.  IMPRESSION: No active cardiopulmonary disease.   Electronically Signed   By: Tiburcio Pea M.D.   On: 09/15/2013 16:41   Ct Head Wo Contrast  09/15/2013   CLINICAL DATA:  Altered mental status.  Confusion.  EXAM: CT HEAD WITHOUT CONTRAST  TECHNIQUE: Contiguous axial images were obtained from the base of the skull through the vertex without intravenous contrast.  COMPARISON:  CT HEAD W/O CM dated 07/26/2013  FINDINGS: Mild atrophy and diffuse  white matter disease is similar to the prior exams. No acute cortical infarct, hemorrhage, or mass lesion is present. 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 The ventricles are otherwise of normal size. No significant extra-axial fluid collection is present. The paranasal sinuses and mastoid air cells are clear.  IMPRESSION: 1. Stable atrophy and white matter disease. 2. No acute intracranial abnormality or significant interval change.   Electronically Signed   By: Gennette Pac M.D.   On: 09/15/2013 16:51    Scheduled Meds: . ALPRAZolam  0.5 mg Oral TID  . aspirin EC  81 mg Oral Daily  . calcitRIOL  0.5 mcg Oral Daily  . carvedilol  25 mg Oral BID WC  . enoxaparin (LOVENOX) injection  30 mg Subcutaneous Q24H  . feeding supplement (RESOURCE BREEZE)  1 Container Oral Q24H  . multivitamin with minerals  1 tablet Oral Daily  . polyethylene glycol  17 g Oral TID  . sodium chloride  3 mL Intravenous Q12H   Continuous Infusions: . sodium chloride 0.9 % 1,000 mL with potassium chloride 20 mEq infusion 75 mL/hr at 09/15/13 2225    Active Problems:   Anxiety   Depression   Hyponatremia   Chronic systolic heart failure   Acute encephalopathy   Dehydration   CKD (chronic kidney disease) stage 3, GFR 30-59 ml/min    Conley CanalYork, Marianne L, PA-C  [facility] Pager [phone]. If 7PM-7AM, please contact night-coverage at [email or web address], password [email or web address] 09/16/2013, 3:11 PM  LOS: 1 day   Attending Seen and examined, admitted with confusion/psychosis-likely multifactorial predominantly secondary to Bereavement, worsening anxiety and depression, also be some amount of Xanax withdrawal. Did have mild hyponatremia that has now resolved. Await psychiatric evaluation. Disposition is not clear at this time.  Windell NorfolkS Mirela Parsley MD

## 2013-09-16 NOTE — Progress Notes (Signed)
Pt arrive to unit in no s/s of distress. Pt oriented to room. Pt oriented x1. VS stable. Tele applied. Daughters at bedside. Callbell within reach. Bed alarm on. Whiteboard updated. Report received from [place], RN prior to pt's arrival.

## 2013-09-16 NOTE — Progress Notes (Addendum)
INITIAL NUTRITION ASSESSMENT  DOCUMENTATION CODES Per approved criteria  -Severe  malnutrition in the context of social or environmental circumstances   INTERVENTION: Monitor magnesium, potassium, and phosphorus daily for at least 3 days, MD to replete as needed, as pt is at risk for refeeding syndrome given severe malnutrition. Add Resource Breeze po daily, each supplement provides 250 kcal and 9 grams of protein. Add MVI daily. RD to continue to follow nutrition care plan.  NUTRITION DIAGNOSIS: Inadequate oral intake related to grieving process as evidenced by poor intake and weight loss since death of husband.   Goal: Intake to meet >90% of estimated nutrition needs.  Monitor:  weight trends, lab trends, I/O's, PO intake, supplement tolerance  Reason for Assessment: Malnutrition Screening Tool  73 y.o. female  Admitting Dx: AMS  ASSESSMENT: PMHx significant for dementia, HF, HTN, CKD, nephrectomy, bladder resection with R urostomy and ileal condiuit. Admitted with increased confusion. Of note, patient's husband died 12 days ago. Work-up reveals acute encephalopathy.  Pt with decreased PO intake since husband's passing, 12 days PTA.  Currently ordered for a Heart Healthy diet. Pt with 11% wt loss x 1 month. Pt confirms weight loss since husband's death. Very emotional during our conversation. She cannot tell me if or how much she was eating PTA. Pt with severe temporal and clavicle wasting. Ate cereal for breakfast. Stated that she doesn't like Ensure but she is agreeable to [employer].  Pt meets criteria for severe MALNUTRITION in the context of social/environmental circumstances as evidenced by >5% wt loss x 1 month and severe muscle depletion.  Sodium low at 134 Potassium low at 3.4   Height: Ht Readings from Last 1 Encounters:  09/15/13 5\' 5"  (1.651 m)    Weight: Wt Readings from Last 1 Encounters:  09/15/13 112 lb 10.5 oz (51.1 kg)    Ideal Body Weight:  125 lb  % Ideal Body Weight: 90%  Wt Readings from Last 10 Encounters:  09/15/13 112 lb 10.5 oz (51.1 kg)  08/04/13 126 lb 8 oz (57.38 kg)  08/03/13 124 lb (56.246 kg)  08/01/13 120 lb (54.432 kg)  07/27/13 120 lb 11.2 oz (54.749 kg)  06/16/13 130 lb (58.968 kg)  06/12/13 134 lb (60.782 kg)  01/19/13 128 lb 1.4 oz (58.1 kg)  12/22/12 127 lb 12.8 oz (57.97 kg)  05/29/12 126 lb 5.2 oz (57.3 kg)    Usual Body Weight: 120 - 125 lb  % Usual Body Weight: 89%  BMI:  Body mass index is 18.75 kg/(m^2). Normal weight  Estimated Nutritional Needs: Kcal: 1500 - 1650  Protein: 45 - 55 g Fluid: at least 1.5 liters daily  Skin: intact  Diet Order: Cardiac  EDUCATION NEEDS: -No education needs identified at this time   Intake/Output Summary (Last 24 hours) at 09/16/13 1108 Last data filed at 09/16/13 0622  Gross per 24 hour  Intake 596.25 ml  Output   1565 ml  Net -968.75 ml    Last BM: 3/24  Labs:   Recent Labs Lab 09/15/13 1600 09/16/13 0532  NA 128* 134*  K 3.0* 3.4*  CL 87* 95*  CO2 23 23  BUN 23 21  CREATININE 1.37* 1.43*  CALCIUM 9.5 9.0  GLUCOSE 99 91    CBG (last 3)   Recent Labs  09/15/13 1528  GLUCAP 120*    Scheduled Meds: . ALPRAZolam  0.5 mg Oral TID  . aspirin EC  81 mg Oral Daily  . calcitRIOL  0.5 mcg  Oral Daily  . carvedilol  25 mg Oral BID WC  . enoxaparin (LOVENOX) injection  40 mg Subcutaneous Q24H  . polyethylene glycol  17 g Oral TID  . sodium chloride  3 mL Intravenous Q12H    Continuous Infusions: . sodium chloride 0.9 % 1,000 mL with potassium chloride 20 mEq infusion 75 mL/hr at 09/15/13 2225    Past Medical History  Diagnosis Date  . Gastroesophageal reflux   . Chronic UTI   . AV fistula     clotted in her left forearm  . Cellulitis   . Pneumothorax     secondary to central line placement  . Myocardial infarction 2011; 2012  . Numbness and tingling of both legs   . Anginal pain   . Heart murmur   .  Dysrhythmia     "heart beats real fast"  . Chronic anemia   . Pneumonia     "one time"  . Chronic chest pain 01/14/12    1-2 X/day/H&P  . Arthritis     "at different places"  . Anxiety   . Depression   . Headache(784.0)     "alot; not daily"  . Frequent falls   . Dementia   . Collagen vascular disease   . S/P ileal conduit     post cystectomy/ileal conduit  . Anemia     Received IV Ferahe[facility]x) 2 at [facility]  . Iron deficiency     Received IV Feraheme (x) 2 at [facility].  . Dysarthria 03/31/13    Speech is almost a little bit dysarthritic  . Unsteady     Stronger since she came out of nursing home & is walking with a cane  . Edema 03/31/13    Gained 9 pounds overnight & telephone nurse instructed her to take 20mg  of Lasix bid on 03/30/13. Still has swelling in legs.  . Metabolic acidosis     Chronic  . Hypertension     Good control  . CHF (congestive heart failure)     Systolic heart failure EF 25-30% by ECHO 2012  . Altered mental state     Recurrent episodes, with confusion variously related to narcotic use, infection, etc. and has required hospitilization, nursing home stays for rehab related to weakness, encephalopathy, etc.  . History of sepsis 2012    With E.Coli bacteremia  . E coli bacteremia 2012    History of sepsis & E.Coli bacteremia  . Secondary hyperparathyroidism     Dr. Caryn Section had a sestamibi scan of her neck in May 2014 which was negative for parathyroid activity in her neck.  . Chronic kidney disease (CKD), stage III (moderate)     [name] 09/15/2013  . Hydronephrosis     She has chronic mild left   . Single kidney     post right nephrectomy  . Renal insufficiency     Past Surgical History  Procedure Laterality Date  . Hip arthroplasty      left; S/P fracture  . Rod left leg  2012    left hip "to almost my knee; for my hip"  . Creation / revision of ileostomy / jejunostomy  1973  . Nephrectomy  1973    right  . Fracture surgery    . Partial  colectomy  1970  . Tonsillectomy and adenoidectomy      "when I was a kid"  . Cholecystectomy  2000  . Appendectomy  2000  . Abdominal hysterectomy    . Dilation  and curettage of uterus    . Cesarean section  1969    only 4th of 4 children was c-section  . Av fistula placement  1970's    LFA  . Bladder removal  1974    with ileal conduit and urostomy    Jarold MottoSamantha Elnor Renovato MS, RD, LDN Inpatient Registered Dietitian Pager: [phone] After-hours pager: [phone]

## 2013-09-16 NOTE — Progress Notes (Signed)
VASCULAR LAB PRELIMINARY  PRELIMINARY  PRELIMINARY  PRELIMINARY  Bilateral lower extremity venous duplex completed.    Preliminary report:  Bilateral:  No evidence of DVT, superficial thrombosis, or Baker's Cyst.   [name], RVS 09/16/2013, 9:23 AM

## 2013-09-16 NOTE — Clinical Social Work Psychosocial (Signed)
Clinical Social Work Department BRIEF PSYCHOSOCIAL ASSESSMENT 09/16/2013  Patient:  [name],[name]     Account Number:  [id number]     Admit date:  09/15/2013  Clinical Social Worker:  Lavell LusterAMPBELL,Liyah Higham BRYANT, LCSWA  Date/Time:  09/16/2013 03:00 PM  Referred by:  Physician  Date Referred:  09/16/2013 Referred for  SNF Placement   Other Referral:   Interview type:  Family Other interview type:   Patient not oriented at time of assessment. CSW interviewed patient's daughter [name] to complete assessment.    PSYCHOSOCIAL DATA Living Status:  ALONE Admitted from facility:   Level of care:   Primary support name:  [name] Primary support relationship to patient:  CHILD, ADULT Degree of support available:   Support is adequate.    CURRENT CONCERNS Current Concerns  Post-Acute Placement   Other Concerns:    SOCIAL WORK ASSESSMENT / PLAN CSW spoke with patient's daughter [name] to complete assessment. [name] states that she lost her father (patient's husband) 13 days ago to brain cancer. She states that her mother has not been the same since. [name] states that her mother has been acting very strange. She states that her mother called the police on her daughter because she thought someone was breaking into her home. [name] states that the patient got very aggressive last Friday night. [name] is a Child psychotherap[place]ocial worker [place] [employer] and has good insight into how traumatic losing her husband of 56 years could be for the patient. [name] feels that her behavior is a combination of this recent loss and her medications.    [name] states that she would like her [facility] [place] because she does not feel that patient is safe to return home at this time. CSW explained that a [facility] can be [facility] but [facility] [place] cannot be guaranteed. [name] verbalized understand but feels confident patient will have a bed at the facility since she  has been there two times in the past. CSW explained Medicare SNF benefits and how patient will need qualifying 3 night stay and must have a skilled need for SNF. MD has ordered PT to evaluate patient. CSW explained SNF search/placement process. [name] states that she and her sisters are great support for patient but that providing the level of care she needs at this time is too stressful and difficult for them at this time. CSW offered emotional support to [name].    CSW inquired about [name]'s plan for patient after SNF. [name] states that they are unsure about what will happen with patient after SNF stay. CSW encouraged [name] to be looking forward and planning for her mother's needs post SNF. CSW asked if [name] would like a list of ALFs for [facility] [place]   Assessment/plan status:  Psychosocial Support/Ongoing Assessment of Needs Other assessment/ plan:   Complete FL2, Fax, PASRR   Information/referral to community resources:   CSW contact information, SNF list, and ALF list given to [name].    PATIENT'S/FAMILY'S RESPONSE TO PLAN OF CARE: Patient's daughter [name] states that she would like patient to [facility] [place]. [name] was pleasant, appropriate, and appreciative of CSW contact. [name] seems somewhat overwhelmed by the recent changes in her family (losing her father, and her mother's current condition). CSW will continue to offer support to family and assist with DC needs.       [name] Jadien Lehigh, WallaceLCSWA, [place], [phone]

## 2013-09-16 NOTE — Consult Note (Signed)
Reason for Consult:depression Referring Physician: Jonetta Osgood, MD   [name] is an 73 y.o. female.  HPI: The patient was seen and chart reviewed. Patient stated she does not know why she was hospitalized and stated her daughter does not even know that she has been depressed and crying regularly since she last her husband for brain cancer days ago. Patient has been struggling with depression, anxiety, nervousness, distance of sleep and appetite. Reportedly patient also self-medicating with her medication is Xanax but has no current withdrawal symptoms. Patient denies suicidal/homicidal ideation intentions or plans. Patient denies psychotic symptoms. Patient is a poor historian because of her cognitive deficits. Patient knows her first and last name and date of birth, and names of her daughter. She does not know the name of the hospital, her medications and she stated she was about 73 years old and her husband died in October 07, 2022 and actually 4 days ago.   Medical history: 73 year old female with history of dementia, chronic systolic heart failure, hypertension, chronic kidney disease, nephrectomy, bladder resection with right urostomy and ileal conduit, who lives by herself presented to ED with one week history of increasing confusion. The patient's husband died approximately 12 days ago, and since then the patient has had increasing confusion, agitation, and hallucinations. From the advice of her nephrologist, the patient was brought to the ED for further evaluation today because of the agitation. The patient has been dispensing her own medications since the death of her husband. Her husband previously dispensed medications for the patient. The daughters expressed concern that the patient took an abundance of her Xanax during the funeral, and has run out of Xanax as a result. The patient has not had any Xanax for 5 days.   Mental Status Examination: Patient appeared as per his stated age, but  this is psychomotor activity and fair ye contact. Patient has depressed mood and affect was constricted.  She  has  soft butnormal rate, rhythm, and  low volume of speech.  Her  thought process is linear and goal directed and additionally confused. Patient has denied suicidal, homicidal ideations, intentions or plans. Patient has no evidence of auditory or visual hallucinations, delusions, and paranoia. Patient has decreased cognitive functions and has poor insight judgment and impulse control.  Past Medical History  Diagnosis Date  . Gastroesophageal reflux   . Chronic UTI   . AV fistula     clotted in her left forearm  . Cellulitis   . Pneumothorax     secondary to central line placement  . Myocardial infarction 2011; 2012  . Numbness and tingling of both legs   . Anginal pain   . Heart murmur   . Dysrhythmia     "heart beats real fast"  . Chronic anemia   . Pneumonia     "one time"  . Chronic chest pain 01/14/12    1-2 X/day/H&P  . Arthritis     "at different places"  . Anxiety   . Depression   . Headache(784.0)     "alot; not daily"  . Frequent falls   . Dementia   . Collagen vascular disease   . S/P ileal conduit     post cystectomy/ileal conduit  . Anemia     Received IV Feraheme (x) 2 at [facility]  . Iron deficiency     Received IV Feraheme (x) 2 at [facility].  . Dysarthria 03/31/13    Speech is almost a little bit dysarthritic  .  Unsteady     Stronger since she came out of nursing home & is walking with a cane  . Edema 03/31/13    Gained 9 pounds overnight & telephone nurse instructed her to take 45m of Lasix bid on 03/30/13. Still has swelling in legs.  . Metabolic acidosis     Chronic  . Hypertension     Good control  . CHF (congestive heart failure)     Systolic heart failure EF 25-30% by ECHO 2012  . Altered mental state     Recurrent episodes, with confusion variously related to narcotic use, infection, etc. and has required hospitilization, nursing home  stays for rehab related to weakness, encephalopathy, etc.  . History of sepsis 2012    With E.Coli bacteremia  . E coli bacteremia 2012    History of sepsis & E.Coli bacteremia  . Secondary hyperparathyroidism     Dr. FHassell Donehad a sestamibi scan of her neck in May 2014 which was negative for parathyroid activity in her neck.  . Chronic kidney disease (CKD), stage III (moderate)     /Archie Endo3/24/2015  . Hydronephrosis     She has chronic mild left   . Single kidney     post right nephrectomy  . Renal insufficiency     Past Surgical History  Procedure Laterality Date  . Hip arthroplasty      left; S/P fracture  . Rod left leg  2012    left hip "to almost my knee; for my hip"  . Creation / revision of ileostomy / jejunostomy  1973  . Nephrectomy  1973    right  . Fracture surgery    . Partial colectomy  1970  . Tonsillectomy and adenoidectomy      "when I was a kid"  . Cholecystectomy  2000  . Appendectomy  2000  . Abdominal hysterectomy    . Dilation and curettage of uterus    . Cesarean section  1969    only 4th of 4 children was c-section  . Av fistula placement  1970's    LFA  . Bladder removal  1974    with ileal conduit and urostomy    Family History  Problem Relation Age of Onset  . Heart attack Mother   . Healthy Daughter   . Healthy Daughter   . Healthy Son     Social History:  reports that she quit smoking about 9 years ago. Her smoking use included Cigarettes. She smoked 0.00 packs per day for 2 years. She has never used smokeless tobacco. She reports that she does not drink alcohol or use illicit drugs.  Allergies:  Allergies  Allergen Reactions  . Ketorolac Tromethamine Rash    Medications: I have reviewed the patient's current medications.  Results for orders placed during the hospital encounter of 09/15/13 (from the past 48 hour(s))  URINALYSIS, ROUTINE W REFLEX MICROSCOPIC     Status: Abnormal   Collection Time    09/15/13  3:24 PM      Result  Value Ref Range   Color, Urine YELLOW  YELLOW   APPearance CLEAR  CLEAR   Specific Gravity, Urine 1.009  1.005 - 1.030   pH 6.5  5.0 - 8.0   Glucose, UA NEGATIVE  NEGATIVE mg/dL   Hgb urine dipstick TRACE (*) NEGATIVE   Bilirubin Urine NEGATIVE  NEGATIVE   Ketones, ur NEGATIVE  NEGATIVE mg/dL   Protein, ur NEGATIVE  NEGATIVE mg/dL   Urobilinogen, UA 0.2  0.0 - 1.0 mg/dL   Nitrite NEGATIVE  NEGATIVE   Leukocytes, UA MODERATE (*) NEGATIVE  URINE MICROSCOPIC-ADD ON     Status: Abnormal   Collection Time    09/15/13  3:24 PM      Result Value Ref Range   Squamous Epithelial / LPF RARE  RARE   WBC, UA 3-6  <3 WBC/hpf   RBC / HPF 0-2  <3 RBC/hpf   Bacteria, UA FEW (*) RARE   Urine-Other AMORPHOUS URATES/PHOSPHATES    CBG MONITORING, ED     Status: Abnormal   Collection Time    09/15/13  3:28 PM      Result Value Ref Range   Glucose-Capillary 120 (*) 70 - 99 mg/dL  URINE CULTURE     Status: None   Collection Time    09/15/13  3:53 PM      Result Value Ref Range   Specimen Description URINE, CLEAN CATCH     Special Requests NONE     Culture  Setup Time       Value: 09/15/2013 21:43     Performed at [facility] PENDING     Culture       Value: Culture reincubated for better growth     Performed at [facility]   Report Status PENDING    CBC     Status: Abnormal   Collection Time    09/15/13  4:00 PM      Result Value Ref Range   WBC 9.0  4.0 - 10.5 K/uL   RBC 3.64 (*) 3.87 - 5.11 MIL/uL   Hemoglobin 12.5  12.0 - 15.0 g/dL   HCT 35.3 (*) 36.0 - 46.0 %   MCV 97.0  78.0 - 100.0 fL   MCH 34.3 (*) 26.0 - 34.0 pg   MCHC 35.4  30.0 - 36.0 g/dL   RDW 13.6  11.5 - 15.5 %   Platelets 243  150 - 400 K/uL  COMPREHENSIVE METABOLIC PANEL     Status: Abnormal   Collection Time    09/15/13  4:00 PM      Result Value Ref Range   Sodium 128 (*) 137 - 147 mEq/L   Potassium 3.0 (*) 3.7 - 5.3 mEq/L   Chloride 87 (*) 96 - 112 mEq/L   CO2 23  19 - 32 mEq/L    Glucose, Bld 99  70 - 99 mg/dL   BUN 23  6 - 23 mg/dL   Creatinine, Ser 1.37 (*) 0.50 - 1.10 mg/dL   Calcium 9.5  8.4 - 10.5 mg/dL   Total Protein 7.5  6.0 - 8.3 g/dL   Albumin 3.5  3.5 - 5.2 g/dL   AST 20  0 - 37 U/L   ALT 17  0 - 35 U/L   Alkaline Phosphatase 61  39 - 117 U/L   Total Bilirubin 0.4  0.3 - 1.2 mg/dL   GFR calc non Af Amer 37 (*) >90 mL/min   GFR calc Af Amer 43 (*) >90 mL/min   Comment: (NOTE)     The eGFR has been calculated using the CKD EPI equation.     This calculation has not been validated in all clinical situations.     eGFR's persistently <90 mL/min signify possible Chronic Kidney     Disease.  LACTIC ACID, PLASMA     Status: None   Collection Time    09/15/13  4:00 PM  Result Value Ref Range   Lactic Acid, Venous 1.0  0.5 - 2.2 mmol/L  VITAMIN B12     Status: None   Collection Time    09/15/13 10:26 PM      Result Value Ref Range   Vitamin B-12 612  211 - 911 pg/mL   Comment: Performed at [facility]     Status: None   Collection Time    09/15/13 10:26 PM      Result Value Ref Range   RBC Folate 598  >280 ng/mL   Comment: Reference range not established for pediatric patients.     Performed at [facility]  RPR     Status: None   Collection Time    09/15/13 10:26 PM      Result Value Ref Range   RPR NON REACTIVE  NON REACTIVE   Comment: Performed at [facility]  TSH     Status: None   Collection Time    09/15/13 10:26 PM      Result Value Ref Range   TSH 0.821  0.350 - 4.500 uIU/mL   Comment: Performed at [facility]     Status: None   Collection Time    09/15/13 10:26 PM      Result Value Ref Range   Ammonia 33  11 - 60 umol/L  HIV ANTIBODY (ROUTINE TESTING)     Status: None   Collection Time    09/15/13 10:26 PM      Result Value Ref Range   HIV NON REACTIVE  NON REACTIVE   Comment: (NOTE)     Effective September 28, 2013, [facility] will no longer offer the      current 3rd Generation HIV diagnostic screening assay, HIV Antibodies,     HIV-1/2 EIA, with reflexes. At that time, [facility] will     only offer HIV-1/2 Ag/Ab, 4th Gen, w/ Reflexes as recommended by the     CDC. This HIV diagnostic screening assay tests for antibodies to HIV-1     and HIV-2 as well as HIV p24 antigen and provides greater sensitivity     for the detection of recent infection. Any orders for the 3rd     Generation assay will automatically be referred to the 4th Generation     assay.     Performed at [facility]     Status: None   Collection Time    09/15/13 10:54 PM      Result Value Ref Range   Opiates NONE DETECTED  NONE DETECTED   Cocaine NONE DETECTED  NONE DETECTED   Benzodiazepines NONE DETECTED  NONE DETECTED   Amphetamines NONE DETECTED  NONE DETECTED   Tetrahydrocannabinol NONE DETECTED  NONE DETECTED   Barbiturates NONE DETECTED  NONE DETECTED   Comment:            DRUG SCREEN FOR MEDICAL PURPOSES     ONLY.  IF CONFIRMATION IS NEEDED     FOR ANY PURPOSE, NOTIFY LAB     WITHIN 5 DAYS.                LOWEST DETECTABLE LIMITS     FOR URINE DRUG SCREEN     Drug Class       Cutoff (ng/mL)     Amphetamine      1000     Barbiturate  200     Benzodiazepine   696     Tricyclics       295     Opiates          300     Cocaine          300     THC              50  BASIC METABOLIC PANEL     Status: Abnormal   Collection Time    09/16/13  5:32 AM      Result Value Ref Range   Sodium 134 (*) 137 - 147 mEq/L   Potassium 3.4 (*) 3.7 - 5.3 mEq/L   Chloride 95 (*) 96 - 112 mEq/L   CO2 23  19 - 32 mEq/L   Glucose, Bld 91  70 - 99 mg/dL   BUN 21  6 - 23 mg/dL   Creatinine, Ser 1.43 (*) 0.50 - 1.10 mg/dL   Calcium 9.0  8.4 - 10.5 mg/dL   GFR calc non Af Amer 35 (*) >90 mL/min   GFR calc Af Amer 41 (*) >90 mL/min   Comment: (NOTE)     The eGFR has been calculated using the CKD EPI equation.      This calculation has not been validated in all clinical situations.     eGFR's persistently <90 mL/min signify possible Chronic Kidney     Disease.  CBC     Status: Abnormal   Collection Time    09/16/13  5:32 AM      Result Value Ref Range   WBC 7.0  4.0 - 10.5 K/uL   RBC 3.48 (*) 3.87 - 5.11 MIL/uL   Hemoglobin 11.8 (*) 12.0 - 15.0 g/dL   HCT 33.9 (*) 36.0 - 46.0 %   MCV 97.4  78.0 - 100.0 fL   MCH 33.9  26.0 - 34.0 pg   MCHC 34.8  30.0 - 36.0 g/dL   RDW 13.6  11.5 - 15.5 %   Platelets 248  150 - 400 K/uL    Dg Chest 2 View  09/15/2013   CLINICAL DATA:  Altered mental status.  History of dementia  EXAM: CHEST  2 VIEW  COMPARISON:  07/26/2013  FINDINGS: Normal heart size. Unremarkable upper mediastinal contours. Pulmonary hyperinflation without edema or consolidation. No effusion or pneumothorax. Exaggerated thoracic kyphosis.  IMPRESSION: No active cardiopulmonary disease.   Electronically Signed   By: Jorje Guild M.D.   On: 09/15/2013 16:41   Ct Head Wo Contrast  09/15/2013   CLINICAL DATA:  Altered mental status.  Confusion.  EXAM: CT HEAD WITHOUT CONTRAST  TECHNIQUE: Contiguous axial images were obtained from the base of the skull through the vertex without intravenous contrast.  COMPARISON:  CT HEAD W/O CM dated 07/26/2013  FINDINGS: Mild atrophy and diffuse white matter disease is similar to the prior exams. No acute cortical infarct, hemorrhage, or mass lesion is present. 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 The ventricles are otherwise of normal size. No significant extra-axial fluid collection is present. The paranasal sinuses and mastoid air cells are clear.  IMPRESSION: 1. Stable atrophy and white matter disease. 2. No acute intracranial abnormality or significant interval change.   Electronically Signed   By: Lawrence Santiago M.D.   On: 09/15/2013 16:51    Positive for anxiety, bad mood, depression, sleep disturbance and Grief Blood pressure 120/66, pulse 64,  temperature 99.7 F (37.6 C), temperature source Oral, resp.  rate 18, height 5' 5"  (1.651 m), weight 51.1 kg (112 lb 10.5 oz), SpO2 96.00%.   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 09/16/2013, 4:23 PM

## 2013-09-17 DIAGNOSIS — R4182 Altered mental status, unspecified: Secondary | ICD-10-CM

## 2013-09-17 DIAGNOSIS — F4321 Adjustment disorder with depressed mood: Secondary | ICD-10-CM

## 2013-09-17 LAB — URINE CULTURE: Colony Count: >=100000

## 2013-09-17 MED ORDER — ZOLPIDEM TARTRATE 5 MG PO TABS
5.0000 mg | ORAL_TABLET | Freq: Every evening | ORAL | Status: DC | PRN
  Administered 2013-09-17: 5 mg via ORAL
  Filled 2013-09-17: qty 1

## 2013-09-17 MED ORDER — ALPRAZOLAM 0.5 MG PO TABS
0.5000 mg | ORAL_TABLET | Freq: Two times a day (BID) | ORAL | Status: DC
  Administered 2013-09-18: 0.5 mg via ORAL
  Filled 2013-09-17: qty 1

## 2013-09-17 MED ORDER — ESCITALOPRAM OXALATE 10 MG PO TABS
10.0000 mg | ORAL_TABLET | Freq: Every day | ORAL | Status: DC
  Administered 2013-09-18: 10 mg via ORAL
  Filled 2013-09-17: qty 1

## 2013-09-17 MED ORDER — MIRTAZAPINE 15 MG PO TABS
15.0000 mg | ORAL_TABLET | Freq: Every day | ORAL | Status: DC
  Administered 2013-09-17: 15 mg via ORAL
  Filled 2013-09-17 (×2): qty 1

## 2013-09-17 MED ORDER — POLYETHYLENE GLYCOL 3350 17 G PO PACK
17.0000 g | PACK | Freq: Every day | ORAL | Status: DC
  Administered 2013-09-18: 17 g via ORAL
  Filled 2013-09-17: qty 1

## 2013-09-17 NOTE — Progress Notes (Signed)
PATIENT DETAILS Name: [name] Age: 73 y.o. Sex: female Date of Birth: May 23, 1941 Admit Date: 09/15/2013 Admitting Physician Catarina Hartshorn, MD ZOX:[id number] U, MD  Subjective: Very tearful, crying.  Assessment/Plan: Acute encephalopathy/Psychosis  -Multifactorial including bereavement, dehydration, and possibly benzodiazepine withdrawal. Confusion has resolved, patient is crying and seems very depressed. -Urinalysis was negative for pyuria,CT head negative,Urine drug screen is negative.  -TSH, serum B12, ammonia, RPR, RBC folate are all normal  -Urine cultures are unreliable in the setting of ileal conduit  - Psych consulted- started on Lexapro and Remeron.  Anxiety and depression  - Worsened with recent death of her spouse is 60 years  - Ran out of Xanax for the past few days. Xanax has now been resumed.  Hyponatremia  -This is likely volume depletion given history of decreased po intake and hemoconcentration (baseline Hgb= 10)  -nearly resolved with IV fluids -monitor periodically   CKD stage III  -Baseline creatinine 1.3-1.6. Currently at baseline.  -Patient has history of nephrectomy, cystectomy and ileal conduit  -Continue calcitriol although not on med rec, pt has bottle of pills   Chronic systolic CHF  -Stable and compensated continue beta blocker.  -EF 55-60%   Hypertension  -Continue carvedilol. BP wnl and stable.  -Hold amlodipine 5mg --although not on med rec--pt has recent refill   Leg pain/cramping  -doppler u/s negative for DVT.  -flexeril daily prn for spasms.  Disposition: Remain inpatient  DVT Prophylaxis: Prophylactic Lovenox   Code Status: Full code  Family Communication None at bedside.  Procedures:  None  CONSULTS:  psychiatry  Time spent 40 minutes-which includes 50% of the time with face-to-face with patient/ family and coordinating care related to the above assessment and plan.    MEDICATIONS: Scheduled  Meds: . ALPRAZolam  0.5 mg Oral TID  . aspirin EC  81 mg Oral Daily  . calcitRIOL  0.5 mcg Oral Daily  . carvedilol  25 mg Oral BID WC  . enoxaparin (LOVENOX) injection  30 mg Subcutaneous Q24H  . escitalopram  5 mg Oral Daily  . feeding supplement (RESOURCE BREEZE)  1 Container Oral Q24H  . mirtazapine  7.5 mg Oral QHS  . multivitamin with minerals  1 tablet Oral Daily  . polyethylene glycol  17 g Oral TID  . sodium chloride  3 mL Intravenous Q12H   Continuous Infusions: . sodium chloride 0.9 % 1,000 mL with potassium chloride 20 mEq infusion 20 mL/hr at 09/16/13 1521   PRN Meds:.acetaminophen, acetaminophen, cyclobenzaprine, haloperidol lactate, HYDROcodone-acetaminophen, ondansetron (ZOFRAN) IV, ondansetron, zolpidem  Antibiotics: Anti-infectives   None       PHYSICAL EXAM: Vital signs in last 24 hours: Filed Vitals:   09/16/13 1300 09/16/13 2043 09/17/13 0520 09/17/13 0900  BP: 120/66 125/66 138/68   Pulse: 64 81 69 80  Temp: 99.7 F (37.6 C) 98.8 F (37.1 C) 98.1 F (36.7 C)   TempSrc: Oral Oral Oral   Resp: 18 18 18    Height:      Weight:      SpO2: 96% 96% 96%     Weight change:  Filed Weights   09/15/13 2036  Weight: 51.1 kg (112 lb 10.5 oz)   Body mass index is 18.75 kg/(m^2).   Gen Exam: Awake and alert with clear speech.  Very tearful and crying. Neck: Supple, No JVD.   Chest: B/L Clear.   CVS: S1 S2 Regular, no murmurs.  Abdomen: soft, BS +, non tender,  non distended.  Extremities: no edema, lower extremities warm to touch. Neurologic: Non Focal.   Skin: No Rash.   Wounds: N/A.    Intake/Output from previous day:  Intake/Output Summary (Last 24 hours) at 09/17/13 1224 Last data filed at 09/17/13 1030  Gross per 24 hour  Intake      0 ml  Output   1050 ml  Net  -1050 ml     LAB RESULTS: CBC  Recent Labs Lab 09/15/13 1600 09/16/13 0532  WBC 9.0 7.0  HGB 12.5 11.8*  HCT 35.3* 33.9*  PLT 243 248  MCV 97.0 97.4  MCH 34.3* 33.9   MCHC 35.4 34.8  RDW 13.6 13.6    Chemistries   Recent Labs Lab 09/15/13 1600 09/16/13 0532  NA 128* 134*  K 3.0* 3.4*  CL 87* 95*  CO2 23 23  GLUCOSE 99 91  BUN 23 21  CREATININE 1.37* 1.43*  CALCIUM 9.5 9.0    CBG:  Recent Labs Lab 09/15/13 1528  GLUCAP 120*    GFR Estimated Creatinine Clearance: 28.3 ml/min (by C-G formula based on Cr of 1.43).  Coagulation profile No results found for this basename: INR, PROTIME,  in the last 168 hours  Cardiac Enzymes No results found for this basename: CK, CKMB, TROPONINI, MYOGLOBIN,  in the last 168 hours  No components found with this basename: POCBNP,  No results found for this basename: DDIMER,  in the last 72 hours No results found for this basename: HGBA1C,  in the last 72 hours No results found for this basename: CHOL, HDL, LDLCALC, TRIG, CHOLHDL, LDLDIRECT,  in the last 72 hours  Recent Labs  09/15/13 2226  TSH 0.821    Recent Labs  09/15/13 2226  VITAMINB12 612   No results found for this basename: LIPASE, AMYLASE,  in the last 72 hours  Urine Studies No results found for this basename: UACOL, UAPR, USPG, UPH, UTP, UGL, UKET, UBIL, UHGB, UNIT, UROB, ULEU, UEPI, UWBC, URBC, UBAC, CAST, CRYS, UCOM, BILUA,  in the last 72 hours  MICROBIOLOGY: Recent Results (from the past 240 hour(s))  URINE CULTURE     Status: None   Collection Time    09/15/13  3:53 PM      Result Value Ref Range Status   Specimen Description URINE, CLEAN CATCH   Final   Special Requests NONE   Final   Culture  Setup Time     Final   Value: 09/15/2013 21:43     Performed at [facility]   Colony Count     Final   Value: >=100,000 COLONIES/ML     [facility]formed at [facility]   Culture     Final   Value: Multiple bacterial morphotypes present, none predominant. Suggest appropriate recollection if clinically indicated.     Performed at [facility]   Report Status 09/17/2013 FINAL   Final    RADIOLOGY  STUDIES/RESULTS: Dg Chest 2 View  09/15/2013   CLINICAL DATA:  Altered mental status.  History of dementia  EXAM: CHEST  2 VIEW  COMPARISON:  07/26/2013  FINDINGS: Normal heart size. Unremarkable upper mediastinal contours. Pulmonary hyperinflation without edema or consolidation. No effusion or pneumothorax. Exaggerated thoracic kyphosis.  IMPRESSION: No active cardiopulmonary disease.   Electronically Signed   By: Tiburcio PeaJonathan  Watts M.D.   On: 09/15/2013 16:41   Ct Head Wo Contrast  09/15/2013   CLINICAL DATA:  Altered mental status.  Confusion.  EXAM: CT HEAD WITHOUT  CONTRAST  TECHNIQUE: Contiguous axial images were obtained from the base of the skull through the vertex without intravenous contrast.  COMPARISON:  CT HEAD W/O CM dated 07/26/2013  FINDINGS: Mild atrophy and diffuse white matter disease is similar to the prior exams. No acute cortical infarct, hemorrhage, or mass lesion is present. 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 The ventricles are otherwise of normal size. No significant extra-axial fluid collection is present. The paranasal sinuses and mastoid air cells are clear.  IMPRESSION: 1. Stable atrophy and white matter disease. 2. No acute intracranial abnormality or significant interval change.   Electronically Signed   By: Gennette Pac M.D.   On: 09/15/2013 16:51    Jeoffrey Massed, MD  [facility] Pager:[phone]  If 7PM-7AM, please contact night-coverage [email or web address] Password [email or web address] 09/17/2013, 12:24 PM   LOS: 2 days

## 2013-09-17 NOTE — Progress Notes (Signed)
Chaplain responded to consult concerning pt who recently lost spouse. Pt shared about caring for her husband in his illness and about their story as a couple: how they met, etc. Pt was tearful when talking about her husband's death, but she stated that he "always took care" of her, so she doesn't have to worry about finances. Pt also processed through other family issues. Chaplain practiced active listening and caring presence.

## 2013-09-17 NOTE — Evaluation (Signed)
Physical Therapy Evaluation Patient Details Name: [name] MRN: [id number] DOB: [birth year] Today's Date: 09/17/2013   History of Present Illness  Pt admitted with confusion/psychosis-likely multifactorial predominantly secondary to Bereavement, worsening anxiety and depression, also be some amount of Xanax withdrawal.  Clinical Impression  Pt admitted with above. Pt currently with functional limitations due to the deficits listed below (see PT Problem List).  Pt will benefit from skilled PT to increase their independence and safety with mobility to allow discharge to the venue listed below.       Follow Up Recommendations SNF    Equipment Recommendations  None recommended by PT    Recommendations for Other Services       Precautions / Restrictions Precautions Precautions: Fall      Mobility  Bed Mobility Overal bed mobility: Modified Independent                Transfers Overall transfer level: Needs assistance Equipment used: Rolling walker (2 wheeled) Transfers: Sit to/from Stand Sit to Stand: Min assist         General transfer comment: Assist to bring hips up and verbal cues for hand placement.  Ambulation/Gait Ambulation/Gait assistance: Min assist Ambulation Distance (Feet): 150 Feet Assistive device: Rolling walker (2 wheeled) Gait Pattern/deviations: Step-through pattern;Decreased step length - left;Decreased step length - right Gait velocity: decr Gait velocity interpretation: Below normal speed for age/gender General Gait Details: Pt with slight knee flexion bilaterally.   Stairs            Wheelchair Mobility    Modified Rankin (Stroke Patients Only)       Balance Overall balance assessment: Needs assistance Sitting-balance support: No upper extremity supported;Feet supported Sitting balance-Leahy Scale: Fair     Standing balance support: Bilateral upper extremity supported Standing balance-Leahy Scale: Poor                        Pertinent Vitals/Pain No pain.    Home Living Family/patient expects to be discharged to:: Skilled nursing facility Living Arrangements: Alone Available Help at Discharge: Family;Available PRN/intermittently Type of Home: House Home Access: Stairs to enter Entrance Stairs-Rails: Right;Left;Can reach both Entrance Stairs-Number of Steps: 1 Home Layout: Two level;Able to live on main level with bedroom/bathroom Home Equipment: Gilmer Mor - single point;Walker - 2 wheels Additional Comments: Pt was living with husband until husband passed away a few weeks ago.    Prior Function Level of Independence: Independent with assistive device(s)         Comments: amb with cane prior to adm per pt     Hand Dominance   Dominant Hand: Right    Extremity/Trunk Assessment   Upper Extremity Assessment: Overall WFL for tasks assessed           Lower Extremity Assessment: Generalized weakness         Communication   Communication: No difficulties  Cognition Arousal/Alertness: Awake/alert Behavior During Therapy: Anxious Overall Cognitive Status: Impaired/Different from baseline Area of Impairment: Orientation Orientation Level: Disoriented to;Place;Situation   Memory: Decreased short-term memory              General Comments      Exercises        Assessment/Plan    PT Assessment Patient needs continued PT services  PT Diagnosis Difficulty walking;Generalized weakness   PT Problem List Decreased strength;Decreased activity tolerance;Decreased balance;Decreased mobility;Decreased cognition;Decreased knowledge of use of DME  PT Treatment Interventions DME instruction;Gait training;Functional mobility training;Therapeutic activities;Therapeutic exercise;Balance  training;Patient/family education   PT Goals (Current goals can be found in the Care Plan section) Acute Rehab PT Goals Patient Stated Goal: Go home PT Goal Formulation: With patient Time For  Goal Achievement: 09/24/13 Potential to Achieve Goals: Good    Frequency Min 2X/week   Barriers to discharge Decreased caregiver support      End of Session Equipment Utilized During Treatment: Gait belt Activity Tolerance: Patient tolerated treatment well Patient left: with call bell/phone within reach;with bed alarm set         Time: 1209-1223 PT Time Calculation (min): 14 min   Charges:   PT Evaluation $Initial PT Evaluation Tier I: 1 Procedure PT Treatments $Gait Training: 8-22 mins   PT G Codes:          Reality Dejonge 09/17/2013, 1:22 PM  [employer] [facility] [phone]

## 2013-09-17 NOTE — Clinical Social Work Placement (Signed)
Clinical Social Work Department CLINICAL SOCIAL WORK PLACEMENT NOTE 09/17/2013  Patient:  [name],[name]  Account Number:  [id number] Admit date:  09/15/2013  Clinical Social Worker:  Cherre BlancJOSEPH BRYANT Tyreik Delahoussaye, [place]  Date/time:  09/17/2013 03:00 PM  Clinical Social Work is seeking post-discharge placement for this patient at the following level of care:   SKILLED NURSING   (*CSW will update this form in Epic as items are completed)   09/17/2013  Patient/family provided with [facility] of Clinical Social Work's list of facilities offering this level of care within the geographic area requested by the patient (or if unable, by the patient's family).  09/17/2013  Patient/family informed of their freedom to choose among providers that offer the needed level of care, that participate in Medicare, Medicaid or managed care program needed by the patient, have an available bed and are willing to accept the patient.  09/17/2013  Patient/family informed of [facility]' ownership interest in [facility], as well as of the fact that they are under no obligation to receive care at this facility.  PASARR submitted to EDS on  PASARR number received from EDS on   FL2 transmitted to all facilities in geographic area requested by pt/family on  09/17/2013 FL2 transmitted to all facilities within larger geographic area on   Patient informed that his/her managed care company has contracts with or will negotiate with  certain facilities, including the following:     Patient/family informed of bed offers received:  09/17/2013 [facility] Physician recommends and patient chooses bed at    [facility]   Patient to be transferred to facility by Daughter's personal vehicle  The following physician request were entered in Epic:   Additional Comments: Patient's daughter will complete paperwork at facility in the  morning and will come to hospital to transport patient.    [name], [name], [place], [phone]

## 2013-09-17 NOTE — Care Management Note (Signed)
    Page 1 of 1   09/18/2013     2:54:00 PM   CARE MANAGEMENT NOTE 09/18/2013  Patient:  [name],[name]   Account Number:  [id number]  Date Initiated:  09/17/2013  Documentation initiated by:  Letha CapeAYLOR,Gearldine Looney  Subjective/Objective Assessment:   dx acute encephalopathy  admit- lives with brother. patient lost spouse recently.     Action/Plan:   monitor mental status, psych consult   Anticipated DC Date:  09/18/2013   Anticipated DC Plan:  SKILLED NURSING FACILITY  In-house referral  Clinical Social Worker      DC Planning Services  CM consult      Choice offered to / List presented to:             Status of service:  Completed, signed off Medicare Important Message given?   (If response is "NO", the following Medicare IM given date fields will be blank) Date Medicare IM given:   Date Additional Medicare IM given:    Discharge Disposition:  SKILLED NURSING FACILITY  Per UR Regulation:  Reviewed for med. necessity/level of care/duration of stay  If discussed at Long Length of Stay Meetings, dates discussed:    Comments:

## 2013-09-17 NOTE — Clinical Social Work Note (Signed)
CSW met with patient at bedside and spent about 45 minutes with her. Per treatment team, patient has been very depressed and cries often. CSW has been in contact with patient's daughter Mariane Masters who is a Education officer, museum with [employer]. Patient appears calm and relaxed. CSW approached patient to discuss the recommendation for SNF placement. Patient states that she is agreeable to going to [facility] at discharge. CSW spent quite a bit of time with patient to explore her feelings regarding her recent loss of her husband. Patient became very tearful during this part of visit. CSW validated patient's feelings of loss and sadness regarding the death of her husband.   Patient was married to her husband for 56 years and spoke extensively about how they were one and never left each other's side. She takes pride in having taken care of her husband during his final days and expressed thankfulness for having a husband that looked after her like Mr. Kendricks did. CSW inquired about the positive memories patient has about her husband, this really seemed to help patient as she smiled and even had a few laughs. CSW inquired about patient's supports and who she feels she can talk openly with. Patient states that her sister "[name]" is the person she feels most comfortable with and can share anything with. Patient states that [facility] also lost her husband and can related to how she is feeling. CSW encouraged patient and daughter to increase the amount of time [facility] and patient spend together as [name] is clearly a solid support for patient.   CSW inquired about patient's plan after SNF stay. Patient states that she plans to return home. Daughter Lynelle Smoke is unsure if this will be an option unless patient improves significantly since her husband was her primary care giver. Patient states that she refuses to leave her home. CSW has provided a list of ALFs for [facility] for daughter and  patient to review. CSW will continue to support patient and follow for DC needs.   Liz Beach, [place], [place], [phone]

## 2013-09-17 NOTE — Consult Note (Signed)
Reason for Consult:depression Referring Physician: Jonetta Osgood, MD   [name] is an 73 y.o. female.  HPI: Patient stated she does not know why she was hospitalized and stated her daughter does not even know that she has been depressed and crying regularly since she last her husband for brain cancer days ago. Patient has been struggling with depression, anxiety, nervousness, distance of sleep and appetite. Reportedly patient also self-medicating with her medication is Xanax but has no current withdrawal symptoms. Patient denies suicidal/homicidal ideation intentions or plans. Patient denies psychotic symptoms. Patient is a poor historian because of her cognitive deficits. Patient knows her first and last name and date of birth, and names of her daughter. She does not know the name of the hospital, her medications and she stated she was about 73 years old and her husband died in 2022-09-21 and actually 4 days ago.   Medical history: 73 year old female with history of dementia, chronic systolic heart failure, hypertension, chronic kidney disease, nephrectomy, bladder resection with right urostomy and ileal conduit, who lives by herself presented to ED with one week history of increasing confusion. The patient's husband died approximately 12 days ago, and since then the patient has had increasing confusion, agitation, and hallucinations. From the advice of her nephrologist, the patient was brought to the ED for further evaluation today because of the agitation. The patient has been dispensing her own medications since the death of her husband. Her husband previously dispensed medications for the patient. The daughters expressed concern that the patient took an abundance of her Xanax during the funeral, and has run out of Xanax as a result. The patient has not had any Xanax for 5 days.   Interval history:The patient was seen and chart reviewed for psychiatric consultation followup. Patient reported  she has been feeling better since she restarted medication management. Patient has been suffering with loss of her husband and a limited support from her children. Patient has a limited cognitions, unable to recall names of the people she knows for long time and unable to express herself. She also been guarded saying that she has a lot of help from the grandchildren even though her daughter reported she has no family help on daily basis her husband use to fix her medications daily and now she does not take her medication because she is not G. date heroin medication without somebody's helping appropriately.    Mental Status Examination: Patient  is calm, quiet and cooperative but insisted she needed to go home himself placement which was discussed with the primary physician. Patient has normal psychomotor activity and fair eye contact. Patient has depressed mood and affect was constricted.  She  has  soft but normal rate, rhythm, and  low volume of speech.  Her  thought process is tangential, circumstantial and rumination about her life with her husband. Patient does occasionally confused. Patient has denied suicidal, homicidal ideations, intentions or plans. Patient has no evidence of auditory or visual hallucinations, delusions, and paranoia. Patient has decreased cognitive functions and has poor insight judgment and impulse control.  Past Medical History  Diagnosis Date  . Gastroesophageal reflux   . Chronic UTI   . AV fistula     clotted in her left forearm  . Cellulitis   . Pneumothorax     secondary to central line placement  . Myocardial infarction 2011; 2012  . Numbness and tingling of both legs   . Anginal pain   . Heart murmur   .  Dysrhythmia     "heart beats real fast"  . Chronic anemia   . Pneumonia     "one time"  . Chronic chest pain 01/14/12    1-2 X/day/H&P  . Arthritis     "at different places"  . Anxiety   . Depression   . Headache(784.0)     "alot; not daily"  .  Frequent falls   . Dementia   . Collagen vascular disease   . S/P ileal conduit     post cystectomy/ileal conduit  . Anemia     Received IV Feraheme (x) 2 at [facility]  . Iron deficiency     Received IV Feraheme (x) 2 at [facility].  . Dysarthria 03/31/13    Speech is almost a little bit dysarthritic  . Unsteady     Stronger since she came out of nursing home & is walking with a cane  . Edema 03/31/13    Gained 9 pounds overnight & telephone nurse instructed her to take 23m of Lasix bid on 03/30/13. Still has swelling in legs.  . Metabolic acidosis     Chronic  . Hypertension     Good control  . CHF (congestive heart failure)     Systolic heart failure EF 25-30% by ECHO 2012  . Altered mental state     Recurrent episodes, with confusion variously related to narcotic use, infection, etc. and has required hospitilization, nursing home stays for rehab related to weakness, encephalopathy, etc.  . History of sepsis 2012    With E.Coli bacteremia  . E coli bacteremia 2012    History of sepsis & E.Coli bacteremia  . Secondary hyperparathyroidism     Dr. FHassell Donehad a sestamibi scan of her neck in May 2014 which was negative for parathyroid activity in her neck.  . Chronic kidney disease (CKD), stage III (moderate)     /Archie Endo3/24/2015  . Hydronephrosis     She has chronic mild left   . Single kidney     post right nephrectomy  . Renal insufficiency     Past Surgical History  Procedure Laterality Date  . Hip arthroplasty      left; S/P fracture  . Rod left leg  2012    left hip "to almost my knee; for my hip"  . Creation / revision of ileostomy / jejunostomy  1973  . Nephrectomy  1973    right  . Fracture surgery    . Partial colectomy  1970  . Tonsillectomy and adenoidectomy      "when I was a kid"  . Cholecystectomy  2000  . Appendectomy  2000  . Abdominal hysterectomy    . Dilation and curettage of uterus    . Cesarean section  1969    only 4th of 4 children was  c-section  . Av fistula placement  1970's    LFA  . Bladder removal  1974    with ileal conduit and urostomy    Family History  Problem Relation Age of Onset  . Heart attack Mother   . Healthy Daughter   . Healthy Daughter   . Healthy Son     Social History:  reports that she quit smoking about 9 years ago. Her smoking use included Cigarettes. She smoked 0.00 packs per day for 2 years. She has never used smokeless tobacco. She reports that she does not drink alcohol or use illicit drugs.  Allergies:  Allergies  Allergen Reactions  . Ketorolac Tromethamine  Rash    Medications: I have reviewed the patient's current medications.  Results for orders placed during the hospital encounter of 09/15/13 (from the past 48 hour(s))  CBC     Status: Abnormal   Collection Time    09/15/13  4:00 PM      Result Value Ref Range   WBC 9.0  4.0 - 10.5 K/uL   RBC 3.64 (*) 3.87 - 5.11 MIL/uL   Hemoglobin 12.5  12.0 - 15.0 g/dL   HCT 35.3 (*) 36.0 - 46.0 %   MCV 97.0  78.0 - 100.0 fL   MCH 34.3 (*) 26.0 - 34.0 pg   MCHC 35.4  30.0 - 36.0 g/dL   RDW 13.6  11.5 - 15.5 %   Platelets 243  150 - 400 K/uL  COMPREHENSIVE METABOLIC PANEL     Status: Abnormal   Collection Time    09/15/13  4:00 PM      Result Value Ref Range   Sodium 128 (*) 137 - 147 mEq/L   Potassium 3.0 (*) 3.7 - 5.3 mEq/L   Chloride 87 (*) 96 - 112 mEq/L   CO2 23  19 - 32 mEq/L   Glucose, Bld 99  70 - 99 mg/dL   BUN 23  6 - 23 mg/dL   Creatinine, Ser 1.37 (*) 0.50 - 1.10 mg/dL   Calcium 9.5  8.4 - 10.5 mg/dL   Total Protein 7.5  6.0 - 8.3 g/dL   Albumin 3.5  3.5 - 5.2 g/dL   AST 20  0 - 37 U/L   ALT 17  0 - 35 U/L   Alkaline Phosphatase 61  39 - 117 U/L   Total Bilirubin 0.4  0.3 - 1.2 mg/dL   GFR calc non Af Amer 37 (*) >90 mL/min   GFR calc Af Amer 43 (*) >90 mL/min   Comment: (NOTE)     The eGFR has been calculated using the CKD EPI equation.     This calculation has not been validated in all clinical situations.      eGFR's persistently <90 mL/min signify possible Chronic Kidney     Disease.  LACTIC ACID, PLASMA     Status: None   Collection Time    09/15/13  4:00 PM      Result Value Ref Range   Lactic Acid, Venous 1.0  0.5 - 2.2 mmol/L  VITAMIN B12     Status: None   Collection Time    09/15/13 10:26 PM      Result Value Ref Range   Vitamin B-12 612  211 - 911 pg/mL   Comment: Performed at [facility]     Status: None   Collection Time    09/15/13 10:26 PM      Result Value Ref Range   RBC Folate 598  >280 ng/mL   Comment: Reference range not established for pediatric patients.     Performed at [facility]  RPR     Status: None   Collection Time    09/15/13 10:26 PM      Result Value Ref Range   RPR NON REACTIVE  NON REACTIVE   Comment: Performed at [facility]  TSH     Status: None   Collection Time    09/15/13 10:26 PM      Result Value Ref Range   TSH 0.821  0.350 - 4.500 uIU/mL   Comment: Performed at [facility]  Status: None   Collection Time    09/15/13 10:26 PM      Result Value Ref Range   Ammonia 33  11 - 60 umol/L  HIV ANTIBODY (ROUTINE TESTING)     Status: None   Collection Time    09/15/13 10:26 PM      Result Value Ref Range   HIV NON REACTIVE  NON REACTIVE   Comment: (NOTE)     Effective September 28, 2013, [facility] will no longer offer the     current 3rd Generation HIV diagnostic screening assay, HIV Antibodies,     HIV-1/2 EIA, with reflexes. At that time, [facility] will     only offer HIV-1/2 Ag/Ab, 4th Gen, w/ Reflexes as recommended by the     CDC. This HIV diagnostic screening assay tests for antibodies to HIV-1     and HIV-2 as well as HIV p24 antigen and provides greater sensitivity     for the detection of recent infection. Any orders for the 3rd     Generation assay will automatically be referred to the 4th Generation     assay.     Performed at [facility] ([place])     Status: None   Collection Time    09/15/13 10:54 PM      Result Value Ref Range   Opiates NONE DETECTED  NONE DETECTED   Cocaine NONE DETECTED  NONE DETECTED   Benzodiazepines NONE DETECTED  NONE DETECTED   Amphetamines NONE DETECTED  NONE DETECTED   Tetrahydrocannabinol NONE DETECTED  NONE DETECTED   Barbiturates NONE DETECTED  NONE DETECTED   Comment:            DRUG SCREEN FOR MEDICAL PURPOSES     ONLY.  IF CONFIRMATION IS NEEDED     FOR ANY PURPOSE, NOTIFY LAB     WITHIN 5 DAYS.                LOWEST DETECTABLE LIMITS     FOR URINE DRUG SCREEN     Drug Class       Cutoff (ng/mL)     Amphetamine      1000     Barbiturate      200     Benzodiazepine   878     Tricyclics       676     Opiates          300     Cocaine          300     THC              50  BASIC METABOLIC PANEL     Status: Abnormal   Collection Time    09/16/13  5:32 AM      Result Value Ref Range   Sodium 134 (*) 137 - 147 mEq/L   Potassium 3.4 (*) 3.7 - 5.3 mEq/L   Chloride 95 (*) 96 - 112 mEq/L   CO2 23  19 - 32 mEq/L   Glucose, Bld 91  70 - 99 mg/dL   BUN 21  6 - 23 mg/dL   Creatinine, Ser 1.43 (*) 0.50 - 1.10 mg/dL   Calcium 9.0  8.4 - 10.5 mg/dL   GFR calc non Af Amer 35 (*) >90 mL/min   GFR calc Af Amer 41 (*) >90 mL/min   Comment: (NOTE)     The eGFR has been  calculated using the CKD EPI equation.     This calculation has not been validated in all clinical situations.     eGFR's persistently <90 mL/min signify possible Chronic Kidney     Disease.  CBC     Status: Abnormal   Collection Time    09/16/13  5:32 AM      Result Value Ref Range   WBC 7.0  4.0 - 10.5 K/uL   RBC 3.48 (*) 3.87 - 5.11 MIL/uL   Hemoglobin 11.8 (*) 12.0 - 15.0 g/dL   HCT 33.9 (*) 36.0 - 46.0 %   MCV 97.4  78.0 - 100.0 fL   MCH 33.9  26.0 - 34.0 pg   MCHC 34.8  30.0 - 36.0 g/dL   RDW 13.6  11.5 - 15.5 %   Platelets 248  150 - 400 K/uL    Dg Chest 2 View  09/15/2013    CLINICAL DATA:  Altered mental status.  History of dementia  EXAM: CHEST  2 VIEW  COMPARISON:  07/26/2013  FINDINGS: Normal heart size. Unremarkable upper mediastinal contours. Pulmonary hyperinflation without edema or consolidation. No effusion or pneumothorax. Exaggerated thoracic kyphosis.  IMPRESSION: No active cardiopulmonary disease.   Electronically Signed   By: Jorje Guild M.D.   On: 09/15/2013 16:41   Ct Head Wo Contrast  09/15/2013   CLINICAL DATA:  Altered mental status.  Confusion.  EXAM: CT HEAD WITHOUT CONTRAST  TECHNIQUE: Contiguous axial images were obtained from the base of the skull through the vertex without intravenous contrast.  COMPARISON:  CT HEAD W/O CM dated 07/26/2013  FINDINGS: Mild atrophy and diffuse white matter disease is similar to the prior exams. No acute cortical infarct, hemorrhage, or mass lesion is present. 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 The ventricles are otherwise of normal size. No significant extra-axial fluid collection is present. The paranasal sinuses and mastoid air cells are clear.  IMPRESSION: 1. Stable atrophy and white matter disease. 2. No acute intracranial abnormality or significant interval change.   Electronically Signed   By: Lawrence Santiago M.D.   On: 09/15/2013 16:51    Positive for anxiety, bad mood, depression, sleep disturbance and Grief Blood pressure 122/72, pulse 70, temperature 98.1 F (36.7 C), temperature source Oral, resp. rate 18, height _0  (1.651 m), weight 51.1 kg (112 lb 10.5 oz), SpO2 99.00%.   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 Grief  Altered mental status and questionable overdose on her medication  Recommendation: Spoke with patient daughter Lynelle Smoke who is being a Education officer, museum in [place] requested out-of-home placement because she cannot care for herself and family was not able to provide care she needed  Reported family's concerns and wishes to Jonetta Osgood, MD in the  form Continue Xanax to prevent withdrawal seizures which she has been taking for a long time  Increase Lexapro 55m daily for depression/ anxiety and Remeron 15 mg at bedtime for insomnia  Appreciate psychiatric consultation and followup as clinically required    Jaymen Fetch,JANARDHAHA R. 09/17/2013, 3:56 PM

## 2013-09-18 DIAGNOSIS — F329 Major depressive disorder, single episode, unspecified: Secondary | ICD-10-CM

## 2013-09-18 DIAGNOSIS — F411 Generalized anxiety disorder: Secondary | ICD-10-CM

## 2013-09-18 DIAGNOSIS — F3289 Other specified depressive episodes: Secondary | ICD-10-CM

## 2013-09-18 MED ORDER — BOOST / RESOURCE BREEZE PO LIQD: 1.0000 | ORAL | Status: DC

## 2013-09-18 MED ORDER — CALCITRIOL 0.5 MCG PO CAPS: 0.5000 ug | ORAL_CAPSULE | Freq: Every day | ORAL | Status: AC

## 2013-09-18 MED ORDER — ESCITALOPRAM OXALATE 10 MG PO TABS: 10.0000 mg | ORAL_TABLET | Freq: Every day | ORAL | Status: DC

## 2013-09-18 MED ORDER — ALPRAZOLAM 0.5 MG PO TABS: 0.5000 mg | ORAL_TABLET | Freq: Two times a day (BID) | ORAL | Status: DC

## 2013-09-18 MED ORDER — MIRTAZAPINE 15 MG PO TABS: 15.0000 mg | ORAL_TABLET | Freq: Every day | ORAL | Status: AC

## 2013-09-18 NOTE — Progress Notes (Signed)
D/C instructions reviewed with pt. & pt. Daughter.  Both verbalized understanding.  Care notes given for new medications. Forbes Cellarelcine Delores Thelen, RN

## 2013-09-18 NOTE — Progress Notes (Signed)
Patient discharge to main lobby to be escorted to Nursing Facility via daughter

## 2013-09-18 NOTE — Discharge Summary (Signed)
PATIENT DETAILS Name: [name] Age: 73 y.o. Sex: female Date of Birth: 1941-06-21 MRN: [id number]. Admit Date: 09/15/2013 Admitting Physician: Melody Hartshorn, MD WUJ:WJXBJY,NWGNFA U, MD  Recommendations for Outpatient Follow-up:  1. Please monitor chemistries and CBC closely-suggest repeat BMET next week  PRIMARY DISCHARGE DIAGNOSIS:  Active Problems:   Anxiety   Depression   Hyponatremia   Chronic systolic heart failure   Acute encephalopathy   Dehydration   CKD (chronic kidney disease) stage 3, GFR 30-59 ml/min      PAST MEDICAL HISTORY: Past Medical History  Diagnosis Date  . Gastroesophageal reflux   . Chronic UTI   . AV fistula     clotted in her left forearm  . Cellulitis   . Pneumothorax     secondary to central line placement  . Myocardial infarction 2011; 2012  . Numbness and tingling of both legs   . Anginal pain   . Heart murmur   . Dysrhythmia     "heart beats real fast"  . Chronic anemia   . Pneumonia     "one time"  . Chronic chest pain 01/14/12    1-2 X/day/H&P  . Arthritis     "at different places"  . Anxiety   . Depression   . Headache(784.0)     "alot; not daily"  . Frequent falls   . Dementia   . Collagen vascular disease   . S/P ileal conduit     post cystectomy/ileal conduit  . Anemia     Received IV Feraheme (x) 2 at [facility]  . Iron deficiency     Received IV Feraheme (x) 2 at [facility].  . Dysarthria 03/31/13    Speech is almost a little bit dysarthritic  . Unsteady     Stronger since she came out of nursing home & is walking with a cane  . Edema 03/31/13    Gained 9 pounds overnight & telephone nurse instructed her to take 20mg  of Lasix bid on 03/30/13. Still has swelling in legs.  . Metabolic acidosis     Chronic  . Hypertension     Good control  . CHF (congestive heart failure)     Systolic heart failure EF 25-30% by ECHO 2012  . Altered mental state     Recurrent episodes, with confusion variously related to  narcotic use, infection, etc. and has required hospitilization, nursing home stays for rehab related to weakness, encephalopathy, etc.  . History of sepsis 2012    With E.Coli bacteremia  . E coli bacteremia 2012    History of sepsis & E.Coli bacteremia  . Secondary hyperparathyroidism     Dr. Caryn [name] had a sestamibi scan of her neck in May 2014 which was negative for parathyroid activity in her neck.  . Chronic kidney disease (CKD), stage III (moderate)     [name] 09/15/2013  . Hydronephrosis     She has chronic mild left   . Single kidney     post right nephrectomy  . Renal insufficiency     DISCHARGE MEDICATIONS:   Medication List    STOP taking these medications       doxepin 50 MG capsule  Commonly known as:  SINEQUAN     HYDROcodone-acetaminophen 5-325 MG per tablet  Commonly known as:  NORCO/VICODIN      TAKE these medications       ALPRAZolam 0.5 MG tablet  Commonly known as:  XANAX  Take 1 tablet (0.5 mg total) by  mouth 2 (two) times daily.     aspirin EC 81 MG tablet  Take 81 mg by mouth daily.     calcitRIOL 0.5 MCG capsule  Commonly known as:  ROCALTROL  Take 1 capsule (0.5 mcg total) by mouth daily.     calcium citrate-vitamin D 200-200 MG-UNIT Tabs  Take 1 tablet by mouth daily.     carvedilol 25 MG tablet  Commonly known as:  COREG  Take 25 mg by mouth 2 (two) times daily.     escitalopram 10 MG tablet  Commonly known as:  LEXAPRO  Take 1 tablet (10 mg total) by mouth daily.     feeding supplement (RESOURCE BREEZE) Liqd  Take 1 Container by mouth daily.     furosemide 40 MG tablet  Commonly known as:  LASIX  Take 40 mg by mouth once a week. Takes only for swelling     mirtazapine 15 MG tablet  Commonly known as:  REMERON  Take 1 tablet (15 mg total) by mouth at bedtime.     multivitamin tablet  Take 1 tablet by mouth daily.     nitroGLYCERIN 0.4 MG SL tablet  Commonly known as:  NITROSTAT  Place 1 tablet (0.4 mg total) under the tongue  every 5 (five) minutes as needed. For chest pain     sodium bicarbonate 650 MG tablet  Take 650 mg by mouth daily.        ALLERGIES:   Allergies  Allergen Reactions  . Ketorolac Tromethamine Rash    BRIEF HPI:  See H&P, Labs, Consult and Test reports for all details in brief, patient was admitted for confusion. Patient has a history of anxiety and depression, was brought in for confusion, her husband passed a few weeks prior to this admission. She also has a history of nephrectomy, cystectomy and has a ileal conduit with a urostomy in place.  CONSULTATIONS:   psychiatry  PERTINENT RADIOLOGIC STUDIES: Dg Chest 2 View  09/15/2013   CLINICAL DATA:  Altered mental status.  History of dementia  EXAM: CHEST  2 VIEW  COMPARISON:  07/26/2013  FINDINGS: Normal heart size. Unremarkable upper mediastinal contours. Pulmonary hyperinflation without edema or consolidation. No effusion or pneumothorax. Exaggerated thoracic kyphosis.  IMPRESSION: No active cardiopulmonary disease.   Electronically Signed   By: Tiburcio Pea M.D.   On: 09/15/2013 16:41   Ct Head Wo Contrast  09/15/2013   CLINICAL DATA:  Altered mental status.  Confusion.  EXAM: CT HEAD WITHOUT CONTRAST  TECHNIQUE: Contiguous axial images were obtained from the base of the skull through the vertex without intravenous contrast.  COMPARISON:  CT HEAD W/O CM dated 07/26/2013  FINDINGS: Mild atrophy and diffuse white matter disease is similar to the prior exams. No acute cortical infarct, hemorrhage, or mass lesion is present. 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 The ventricles are otherwise of normal size. No significant extra-axial fluid collection is present. The paranasal sinuses and mastoid air cells are clear.  IMPRESSION: 1. Stable atrophy and white matter disease. 2. No acute intracranial abnormality or significant interval change.   Electronically Signed   By: Gennette Pac M.D.   On: 09/15/2013 16:51     PERTINENT  LAB RESULTS: CBC:  Recent Labs  09/15/13 1600 09/16/13 0532  WBC 9.0 7.0  HGB 12.5 11.8*  HCT 35.3* 33.9*  PLT 243 248   CMET CMP     Component Value Date/Time   NA 134* 09/16/2013 0532  K 3.4* 09/16/2013 0532   CL 95* 09/16/2013 0532   CO2 23 09/16/2013 0532   GLUCOSE 91 09/16/2013 0532   BUN 21 09/16/2013 0532   CREATININE 1.43* 09/16/2013 0532   CREATININE 2.12* 08/04/2013 0948   CALCIUM 9.0 09/16/2013 0532   CALCIUM 9.1 07/27/2013 1141   PROT 7.5 09/15/2013 1600   ALBUMIN 3.5 09/15/2013 1600   AST 20 09/15/2013 1600   ALT 17 09/15/2013 1600   ALKPHOS 61 09/15/2013 1600   BILITOT 0.4 09/15/2013 1600   GFRNONAA 35* 09/16/2013 0532   GFRAA 41* 09/16/2013 0532    GFR Estimated Creatinine Clearance: 28.3 ml/min (by C-G formula based on Cr of 1.43). No results found for this basename: LIPASE, AMYLASE,  in the last 72 hours No results found for this basename: CKTOTAL, CKMB, CKMBINDEX, TROPONINI,  in the last 72 hours No components found with this basename: POCBNP,  No results found for this basename: DDIMER,  in the last 72 hours No results found for this basename: HGBA1C,  in the last 72 hours No results found for this basename: CHOL, HDL, LDLCALC, TRIG, CHOLHDL, LDLDIRECT,  in the last 72 hours  Recent Labs  09/15/13 2226  TSH 0.821    Recent Labs  09/15/13 2226  VITAMINB12 612   Coags: No results found for this basename: PT, INR,  in the last 72 hours Microbiology: Recent Results (from the past 240 hour(s))  URINE CULTURE     Status: None   Collection Time    09/15/13  3:53 PM      Result Value Ref Range Status   Specimen Description URINE, CLEAN CATCH   Final   Special Requests NONE   Final   Culture  Setup Time     Final   Value: 09/15/2013 21:43     Performed at [facility]   Colony Count     Final   Value: >=100,000 COLONIES/ML     Performed at [facility]   Culture     Final   Value: Multiple bacterial morphotypes present, none  predominant. Suggest appropriate recollection if clinically indicated.     Performed at [facility]   Report Status 09/17/2013 FINAL   Final     BRIEF HOSPITAL COURSE:  Acute encephalopathy/Psychosis  -Multifactorial including bereavement, dehydration, and possibly benzodiazepine withdrawal. Confusion has resolved, although patient is crying and seems very depressed.  -Urinalysis was negative for pyuria,CT head negative,Urine drug screen is negative. TSH, serum B12, ammonia, RPR, RBC folate are all normal.Urine cultures are unreliable in the setting of ileal conduit  - Psych consulted- started on Lexapro and Remeron- please continue to titrate medications while at SNF.   Anxiety and depression  - Worsened with recent death of her spouse is 60 years  - Ran out of Xanax for the past few days. Xanax has now been resumed.   Hyponatremia  -This is likely volume depletion given history of decreased po intake and hemoconcentration (baseline Hgb= 10)  -nearly resolved with IV fluids -monitor periodically   CKD stage III  -Baseline creatinine 1.3-1.6. Currently at baseline.  -Patient has history of nephrectomy, cystectomy and ileal conduit  -Continue calcitriol although not on med rec, pt has bottle of pills   Chronic systolic CHF  -Stable and compensated continue beta blocker.  -EF 55-60%   Hypertension  -Continue carvedilol. BP wnl and stable.   Leg pain/cramping  -doppler u/s negative for DVT.   TODAY-DAY OF DISCHARGE:  Subjective:  [name] today has no headache,no chest abdominal pain,no new weakness tingling or numbness, feels much better. Her mood seems much more stable today, she is not crying this morning.  Objective:   Blood pressure 145/78, pulse 75, temperature 98.7 F (37.1 C), temperature source Oral, resp. rate 18, height 5\' 5"  (1.651 m), weight 51.1 kg (112 lb 10.5 oz), SpO2 96.00%.  Intake/Output Summary (Last 24 hours) at 09/18/13 1009 Last data  filed at 09/18/13 0942  Gross per 24 hour  Intake 1186.75 ml  Output   1900 ml  Net -713.25 ml   Filed Weights   09/15/13 2036  Weight: 51.1 kg (112 lb 10.5 oz)    Exam Awake Alert, Oriented *3, No new F.N deficits, Normal affect [place].AT,PERRAL Supple Neck,No JVD, No cervical lymphadenopathy appriciated.  Symmetrical Chest wall movement, Good air movement bilaterally, CTAB RRR,No Gallops,Rubs or new Murmurs, No Parasternal Heave +ve B.Sounds, Abd Soft, Non tender, No organomegaly appriciated, No rebound -guarding or rigidity. No Cyanosis, Clubbing or edema, No new Rash or bruise  DISCHARGE CONDITION: Stable  DISPOSITION: SNF  DISCHARGE INSTRUCTIONS:    Activity:  As tolerated with Full fall precautions use walker/cane & assistance as needed  Diet recommendation: Heart Healthy diet       Discharge Orders   Future Appointments Provider Department Dept Phone   10/19/2013 2:30 PM Malissa Hippo, MD [facility] [phone]   Future Orders Complete By Expires   Call MD for:  extreme fatigue  As directed    Diet - low sodium heart healthy  As directed    Increase activity slowly  As directed       Follow-up Information   Follow up with REHMAN,NAJEEB U, MD. Schedule an appointment as soon as possible for a visit in 1 week. (after discharge from SNF)    Specialty:  Gastroenterology   Contact information:   [address], [address] [place] [address] [phone]       Total Time spent on discharge equals 45 minutes.  SignedJeoffrey Massed 09/18/2013 10:09 AM

## 2013-09-18 NOTE — ED Provider Notes (Signed)
I saw and evaluated the patient, reviewed the resident's note and I agree with the findings and plan.   .Face to face Exam:  General:  Awake HEENT:  Atraumatic Resp:  Normal effort Abd:  Nondistended Neuro:No focal weakness   Pio Eatherly L Devory Mckinzie, MD 09/18/13 1101 

## 2013-09-18 NOTE — Clinical Social Work Placement (Signed)
Clinical Social Work Department CLINICAL SOCIAL WORK PLACEMENT NOTE 09/18/2013  Patient:  [name],[name]  Account Number:  [id number] Admit date:  09/15/2013  Clinical Social Worker:  Cherre BlancJOSEPH BRYANT [name], [place]  Date/time:  09/17/2013 03:00 PM  Clinical Social Work is seeking post-discharge placement for this patient at the following level of care:   SKILLED NURSING   (*CSW will update this form in Epic as items are completed)   09/17/2013  Patient/family provided with [facility] Department of Clinical Social Work's list of facilities offering this level of care within the geographic area requested by the patient (or if unable, by the patient's family).  09/17/2013  Patient/family informed of their freedom to choose among providers that offer the needed level of care, that participate in Medicare, Medicaid or managed care program needed by the patient, have an available bed and are willing to accept the patient.  09/17/2013  Patient/family informed of [facility]' ownership interest in [facility], as well as of the fact that they are under no obligation to receive care at this facility.  PASARR submitted to EDS on  PASARR number received from EDS on   FL2 transmitted to all facilities in geographic area requested by pt/family on  09/17/2013 FL2 transmitted to all facilities within larger geographic area on   Patient informed that his/her managed care company has contracts with or will negotiate with  certain facilities, including the following:     Patient/family informed of bed offers received:  09/17/2013 [facility] Physician recommends and patient chooses bed at    [facility]   Patient to be transferred to facility by Daughter's personal vehicle  The following physician request were entered in Epic:   Additional Comments: Patient's daughter will complete paperwork at facility in the  morning and will come to hospital to transport patient.   Per MD patient ready to [facility]. RN, patient, daughter, and facility aware of DC. RN given number for report. DC packet on chart. Patient's daughter will transport patient to facility. CSW signing off.    [name], [place], [place], [phone]

## 2013-10-19 ENCOUNTER — Encounter (INDEPENDENT_AMBULATORY_CARE_PROVIDER_SITE_OTHER): Payer: Self-pay | Admitting: Internal Medicine

## 2013-10-19 ENCOUNTER — Ambulatory Visit (INDEPENDENT_AMBULATORY_CARE_PROVIDER_SITE_OTHER): Payer: Medicare Other | Admitting: Internal Medicine

## 2013-10-19 VITALS — BP 120/70 | HR 66 | Temp 98.1°F | Resp 16 | Ht 65.5 in | Wt 114.5 lb

## 2013-10-19 DIAGNOSIS — Z87898 Personal history of other specified conditions: Secondary | ICD-10-CM

## 2013-10-19 DIAGNOSIS — K219 Gastro-esophageal reflux disease without esophagitis: Secondary | ICD-10-CM

## 2013-10-19 MED ORDER — PANTOPRAZOLE SODIUM 40 MG PO TBEC: 40.0000 mg | DELAYED_RELEASE_TABLET | Freq: Every day | ORAL | Status: DC | PRN

## 2013-10-19 NOTE — Progress Notes (Signed)
Presenting complaint;  Followup for abdominal pain and GERD.  Subjective:  Patient is 73 year old Caucasian female who presents for scheduled visit accompanied by her daughter Babette Relicammy. She was last seen on 08/04/2013 [facility]. She was complaining of abdominal pain she's had this pain for several years. She was not doing well following loss of her husband who was diagnosed with advanced lung cancer and died about 6 weeks ago. She was hospitalized one month ago with depression anxiety and dehydration. He was also confused. Some of her symptoms were felt to be due to benzodiazepine withdrawal. At the time of discharge she was advised not to take doxepin and hydrocodone. She was begun on mirtazapine and escitalopram. [facility] later [facility] her home. She saw Dr. Olena LeatherwoodHasanaj recently and escitalopram dose was increased to 20 mg daily. Patient is able to provide history with help from her daughter. She states she is feeling better. Her appetite is improved. Her daughter states she has gained pound and a half in one week. She is having frequent heartburn and regurgitation but denies nausea vomiting melena rectal bleeding or abdominal pain. She states her bowels move daily and she is not constipated anymore. While she is living alone she has support of several family members and they check on her periodically. She hasn't had a recent fall. She is using cane to ambulate. She was seen by a nephrologist Dr. Eliott Nineunham about 10 days ago and her renal function remained stable.    Current Medications: Outpatient Encounter Prescriptions as of 10/19/2013  Medication Sig  . ALPRAZolam (XANAX) 0.5 MG tablet Take 1 tablet (0.5 mg total) by mouth 2 (two) times daily.  Marland Kitchen. amLODipine (NORVASC) 5 MG tablet Take 5 mg by mouth daily.  Marland Kitchen. aspirin EC 81 MG tablet Take 81 mg by mouth daily.  . calcitRIOL (ROCALTROL) 0.5 MCG capsule Take 1 capsule (0.5 mcg  total) by mouth daily.  . calcium citrate-vitamin D 200-200 MG-UNIT TABS Take 1 tablet by mouth daily.    . carvedilol (COREG) 25 MG tablet Take 25 mg by mouth 2 (two) times daily.  . citalopram (CELEXA) 20 MG tablet Take 20 mg by mouth daily.  . feeding supplement, RESOURCE BREEZE, (RESOURCE BREEZE) LIQD Take 1 Container by mouth daily.  . furosemide (LASIX) 40 MG tablet Take 40 mg by mouth once a week. Takes only for swelling  . mirtazapine (REMERON) 15 MG tablet Take 1 tablet (15 mg total) by mouth at bedtime.  . Multiple Vitamin (MULTIVITAMIN) tablet Take 1 tablet by mouth daily.    . nitroGLYCERIN (NITROSTAT) 0.4 MG SL tablet Place 1 tablet (0.4 mg total) under the tongue every 5 (five) minutes as needed. For chest pain  . sodium bicarbonate 650 MG tablet Take 650 mg by mouth 3 (three) times daily.   . [DISCONTINUED] escitalopram (LEXAPRO) 10 MG tablet Take 1 tablet (10 mg total) by mouth daily.    Objective: Blood pressure 120/70, pulse 66, temperature 98.1 F (36.7 C), temperature source Oral, resp. rate 16, height 5' 5.5" (1.664 m), weight 114 lb 8 oz (51.937 kg). The patient is alert and in no acute distress. Conjunctiva is pink. Sclera is nonicteric Oropharyngeal mucosa is normal. No neck masses or thyromegaly noted. Cardiac exam with regular rhythm normal S1 and S2. No murmur or gallop noted. Lungs are clear to auscultation. Abdomen abdomen is flat with extensive scarring and left lower quadrant and ileal conduit on the  right side with clear urine in bag. Abdomen is soft and nontender without organomegaly or masses.  No LE edema or clubbing noted.  Labs/studies Results: Lab data from 10/08/2013. WBC 8.6, H&H 12.4 and 35.5 and platelet count is 251K. Glucose 117, BUN 33, creatinine 1.81, sodium 131, potassium 3.7, chloride 100, CO2 19, calcium 9.2, phosphorus 3.0 and albumin 4.2. PTH was normal at 57.   Assessment:  #1. GERD. She needs to be back on PPI which she can use on  demand or daily. #2. History of abdominal pain which she has had for more than 30 years and now she is pain free. Previous attempts at getting her off pain medications were not helpful including consultation with pain clinic specialist several years ago. Is interesting to note that she is not taking pain medication and has no abdominal pain. 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 #3. Weight loss secondary to depression resulting from husband's demise. She appears to be getting better.   Plan:  Pantoprazole 40 mg by mouth every morning or when necessary. Office visit in 6 months.

## 2013-10-19 NOTE — Patient Instructions (Addendum)
Can take pantoprazole daily or on an as-needed basis. Call if pantoprazole does not work

## 2013-12-18 ENCOUNTER — Encounter (INDEPENDENT_AMBULATORY_CARE_PROVIDER_SITE_OTHER): Payer: Self-pay

## 2013-12-24 ENCOUNTER — Other Ambulatory Visit: Payer: Self-pay | Admitting: Cardiovascular Disease

## 2014-02-01 ENCOUNTER — Encounter (HOSPITAL_COMMUNITY): Payer: Self-pay | Admitting: Emergency Medicine

## 2014-02-01 ENCOUNTER — Inpatient Hospital Stay (HOSPITAL_COMMUNITY)
Admission: EM | Admit: 2014-02-01 | Discharge: 2014-02-08 | DRG: 682 | Disposition: A | Discharge status: Home Health | Payer: Medicare Other | Attending: Internal Medicine | Admitting: Internal Medicine

## 2014-02-01 ENCOUNTER — Emergency Department (HOSPITAL_COMMUNITY): Payer: Medicare Other

## 2014-02-01 DIAGNOSIS — Z681 Body mass index (BMI) 19 or less, adult: Secondary | ICD-10-CM

## 2014-02-01 DIAGNOSIS — G929 Unspecified toxic encephalopathy: Secondary | ICD-10-CM

## 2014-02-01 DIAGNOSIS — Z9049 Acquired absence of other specified parts of digestive tract: Secondary | ICD-10-CM

## 2014-02-01 DIAGNOSIS — G20A1 Parkinson's disease without dyskinesia, without mention of fluctuations: Secondary | ICD-10-CM | POA: Diagnosis present

## 2014-02-01 DIAGNOSIS — I129 Hypertensive chronic kidney disease with stage 1 through stage 4 chronic kidney disease, or unspecified chronic kidney disease: Secondary | ICD-10-CM | POA: Diagnosis present

## 2014-02-01 DIAGNOSIS — Z96649 Presence of unspecified artificial hip joint: Secondary | ICD-10-CM

## 2014-02-01 DIAGNOSIS — R5381 Other malaise: Secondary | ICD-10-CM | POA: Diagnosis not present

## 2014-02-01 DIAGNOSIS — Z9181 History of falling: Secondary | ICD-10-CM

## 2014-02-01 DIAGNOSIS — I4891 Unspecified atrial fibrillation: Secondary | ICD-10-CM | POA: Diagnosis present

## 2014-02-01 DIAGNOSIS — I509 Heart failure, unspecified: Secondary | ICD-10-CM | POA: Diagnosis present

## 2014-02-01 DIAGNOSIS — N179 Acute kidney failure, unspecified: Secondary | ICD-10-CM | POA: Diagnosis not present

## 2014-02-01 DIAGNOSIS — Z808 Family history of malignant neoplasm of other organs or systems: Secondary | ICD-10-CM

## 2014-02-01 DIAGNOSIS — Z87891 Personal history of nicotine dependence: Secondary | ICD-10-CM

## 2014-02-01 DIAGNOSIS — I10 Essential (primary) hypertension: Secondary | ICD-10-CM | POA: Diagnosis present

## 2014-02-01 DIAGNOSIS — F0390 Unspecified dementia without behavioral disturbance: Secondary | ICD-10-CM | POA: Diagnosis present

## 2014-02-01 DIAGNOSIS — M129 Arthropathy, unspecified: Secondary | ICD-10-CM | POA: Diagnosis present

## 2014-02-01 DIAGNOSIS — R531 Weakness: Secondary | ICD-10-CM | POA: Diagnosis present

## 2014-02-01 DIAGNOSIS — I252 Old myocardial infarction: Secondary | ICD-10-CM

## 2014-02-01 DIAGNOSIS — N183 Chronic kidney disease, stage 3 unspecified: Secondary | ICD-10-CM | POA: Diagnosis present

## 2014-02-01 DIAGNOSIS — E860 Dehydration: Secondary | ICD-10-CM | POA: Diagnosis present

## 2014-02-01 DIAGNOSIS — I5023 Acute on chronic systolic (congestive) heart failure: Secondary | ICD-10-CM | POA: Diagnosis present

## 2014-02-01 DIAGNOSIS — N289 Disorder of kidney and ureter, unspecified: Secondary | ICD-10-CM

## 2014-02-01 DIAGNOSIS — F32A Depression, unspecified: Secondary | ICD-10-CM | POA: Diagnosis present

## 2014-02-01 DIAGNOSIS — Z9089 Acquired absence of other organs: Secondary | ICD-10-CM

## 2014-02-01 DIAGNOSIS — N2581 Secondary hyperparathyroidism of renal origin: Secondary | ICD-10-CM | POA: Diagnosis present

## 2014-02-01 DIAGNOSIS — G9349 Other encephalopathy: Secondary | ICD-10-CM | POA: Diagnosis present

## 2014-02-01 DIAGNOSIS — N390 Urinary tract infection, site not specified: Secondary | ICD-10-CM | POA: Diagnosis present

## 2014-02-01 DIAGNOSIS — G20 Parkinson's disease: Secondary | ICD-10-CM

## 2014-02-01 DIAGNOSIS — N189 Chronic kidney disease, unspecified: Secondary | ICD-10-CM | POA: Diagnosis present

## 2014-02-01 DIAGNOSIS — G8929 Other chronic pain: Secondary | ICD-10-CM | POA: Diagnosis present

## 2014-02-01 DIAGNOSIS — K219 Gastro-esophageal reflux disease without esophagitis: Secondary | ICD-10-CM | POA: Diagnosis present

## 2014-02-01 DIAGNOSIS — F329 Major depressive disorder, single episode, unspecified: Secondary | ICD-10-CM | POA: Diagnosis present

## 2014-02-01 DIAGNOSIS — Z8249 Family history of ischemic heart disease and other diseases of the circulatory system: Secondary | ICD-10-CM

## 2014-02-01 DIAGNOSIS — G92 Toxic encephalopathy: Secondary | ICD-10-CM

## 2014-02-01 DIAGNOSIS — Z905 Acquired absence of kidney: Secondary | ICD-10-CM

## 2014-02-01 DIAGNOSIS — R1032 Left lower quadrant pain: Secondary | ICD-10-CM

## 2014-02-01 DIAGNOSIS — R251 Tremor, unspecified: Secondary | ICD-10-CM

## 2014-02-01 DIAGNOSIS — E43 Unspecified severe protein-calorie malnutrition: Secondary | ICD-10-CM | POA: Diagnosis present

## 2014-02-01 DIAGNOSIS — G9340 Encephalopathy, unspecified: Secondary | ICD-10-CM | POA: Diagnosis present

## 2014-02-01 LAB — COMPREHENSIVE METABOLIC PANEL
ALBUMIN: 3.1 g/dL — AB (ref 3.5–5.2)
ALT: 14 U/L (ref 0–35)
AST: 15 U/L (ref 0–37)
Alkaline Phosphatase: 111 U/L (ref 39–117)
Anion gap: 15 (ref 5–15)
BUN: 51 mg/dL — ABNORMAL HIGH (ref 6–23)
CALCIUM: 9.1 mg/dL (ref 8.4–10.5)
CO2: 24 mEq/L (ref 19–32)
CREATININE: 2.19 mg/dL — AB (ref 0.50–1.10)
Chloride: 95 mEq/L — ABNORMAL LOW (ref 96–112)
GFR calc Af Amer: 24 mL/min — ABNORMAL LOW (ref >90)
GFR calc non Af Amer: 21 mL/min — ABNORMAL LOW (ref >90)
Glucose, Bld: 121 mg/dL — ABNORMAL HIGH (ref 70–99)
Potassium: 3.7 mEq/L (ref 3.7–5.3)
Sodium: 134 mEq/L — ABNORMAL LOW (ref 137–147)
Total Bilirubin: 0.3 mg/dL (ref 0.3–1.2)
Total Protein: 6.9 g/dL (ref 6.0–8.3)

## 2014-02-01 LAB — CBC WITH DIFFERENTIAL/PLATELET
BASOS ABS: 0 10*3/uL (ref 0.0–0.1)
Basophils Relative: 0 % (ref 0–1)
EOS ABS: 0.1 10*3/uL (ref 0.0–0.7)
EOS PCT: 0 % (ref 0–5)
HCT: 36 % (ref 36.0–46.0)
Hemoglobin: 12 g/dL (ref 12.0–15.0)
Lymphocytes Relative: 10 % — ABNORMAL LOW (ref 12–46)
Lymphs Abs: 1.4 10*3/uL (ref 0.7–4.0)
MCH: 32.2 pg (ref 26.0–34.0)
MCHC: 33.3 g/dL (ref 30.0–36.0)
MCV: 96.5 fL (ref 78.0–100.0)
Monocytes Absolute: 1.4 10*3/uL — ABNORMAL HIGH (ref 0.1–1.0)
Monocytes Relative: 10 % (ref 3–12)
Neutro Abs: 11.1 10*3/uL — ABNORMAL HIGH (ref 1.7–7.7)
Neutrophils Relative %: 80 % — ABNORMAL HIGH (ref 43–77)
Platelets: 137 10*3/uL — ABNORMAL LOW (ref 150–400)
RBC: 3.73 MIL/uL — ABNORMAL LOW (ref 3.87–5.11)
RDW: 15.2 % (ref 11.5–15.5)
WBC: 14 10*3/uL — ABNORMAL HIGH (ref 4.0–10.5)

## 2014-02-01 LAB — TROPONIN I

## 2014-02-01 LAB — LIPASE, BLOOD: LIPASE: 42 U/L (ref 11–59)

## 2014-02-01 LAB — PRO B NATRIURETIC PEPTIDE: Pro B Natriuretic peptide (BNP): 314.7 pg/mL — ABNORMAL HIGH (ref 0–125)

## 2014-02-01 MED ORDER — SODIUM CHLORIDE 0.9 % IV SOLN
INTRAVENOUS | Status: DC
  Administered 2014-02-01 – 2014-02-02 (×2): via INTRAVENOUS

## 2014-02-01 MED ORDER — FENTANYL CITRATE 0.05 MG/ML IJ SOLN
12.5000 ug | Freq: Once | INTRAMUSCULAR | Status: AC
  Administered 2014-02-01: 12.5 ug via INTRAVENOUS
  Filled 2014-02-01: qty 2

## 2014-02-01 NOTE — ED Notes (Signed)
Per EMS pt. C/o bilateral flank pain. Pt. Only has left kidney. Pt. Reports pain started this morning. Pt. Reports that she does not have a bladder, she has a ileo conduit.

## 2014-02-01 NOTE — ED Notes (Signed)
Boyd KerbsPenny, RN at bedside attempting IV access.

## 2014-02-01 NOTE — ED Provider Notes (Addendum)
CSN: [id number]     Arrival date & time 02/01/14  1959 History  This chart was scribed for Vanetta Mulders, MD by Elon Spanner, ED Scribe. This patient was seen in room APA10/APA10 and the patient's care was started at 9:46 PM.    Chief Complaint  Patient presents with  . Flank Pain    The history is provided by the patient. No language interpreter was used.   HPI Comments: [name] is a 73 y.o. female with a history of bladder removal, renal insufficiency, hydronephrosis, and chronic kidney disease brought in by ambulance, who presents to the Emergency Department complaining of constant, unchanged moderate back pain.  Patient also reports a current cough, and drooling.  Patient denies headache, fever, vomiting, CP, SOB, dental pain.  No antibiotic allergies.     Drooling.  Abnormal affect.  Flank pain.   1 kidney Hydronephrosis, chronic kidey disease.   PCP: Hasanaj Past Medical History  Diagnosis Date  . Gastroesophageal reflux   . Chronic UTI   . AV fistula     clotted in her left forearm  . Cellulitis   . Pneumothorax     secondary to central line placement  . Myocardial infarction 2011; 2012  . Numbness and tingling of both legs   . Anginal pain   . Heart murmur   . Dysrhythmia     "heart beats real fast"  . Chronic anemia   . Pneumonia     "one time"  . Chronic chest pain 01/14/12    1-2 X/day/H&P  . Arthritis     "at different places"  . Anxiety   . Depression   . Headache(784.0)     "alot; not daily"  . Frequent falls   . Dementia   . Collagen vascular disease   . S/P ileal conduit     post cystectomy/ileal conduit  . Anemia     Received IV Feraheme (x) 2 at [facility]  . Iron deficiency     Received IV Feraheme (x) 2 at [facility].  . Dysarthria 03/31/13    Speech is almost a little bit dysarthritic  . Unsteady     Stronger since she came out of nursing home & is walking with a cane  . Edema 03/31/13    Gained 9 pounds overnight & telephone  nurse instructed her to take 20mg  of Lasix bid on 03/30/13. Still has swelling in legs.  . Metabolic acidosis     Chronic  . Hypertension     Good control  . CHF (congestive heart failure)     Systolic heart failure EF 25-30% by ECHO 2012  . Altered mental state     Recurrent episodes, with confusion variously related to narcotic use, infection, etc. and has required hospitilization, nursing home stays for rehab related to weakness, encephalopathy, etc.  . History of sepsis 2012    With E.Coli bacteremia  . E coli bacteremia 2012    History of sepsis & E.Coli bacteremia  . Secondary hyperparathyroidism     Dr. Caryn Section had a sestamibi scan of her neck in May 2014 which was negative for parathyroid activity in her neck.  . Chronic kidney disease (CKD), stage III (moderate)     [name] 09/15/2013  . Hydronephrosis     She has chronic mild left   . Single kidney     post right nephrectomy  . Renal insufficiency    Past Surgical History  Procedure Laterality Date  . Hip arthroplasty  left; S/P fracture  . Rod left leg  2012    left hip "to almost my knee; for my hip"  . Creation / revision of ileostomy / jejunostomy  1973  . Nephrectomy  1973    right  . Fracture surgery    . Partial colectomy  1970  . Tonsillectomy and adenoidectomy      "when I was a kid"  . Cholecystectomy  2000  . Appendectomy  2000  . Abdominal hysterectomy    . Dilation and curettage of uterus    . Cesarean section  1969    only 4th of 4 children was c-section  . Av fistula placement  1970's    LFA  . Bladder removal  1974    with ileal conduit and urostomy   Family History  Problem Relation Age of Onset  . Heart attack Mother   . Healthy Daughter   . Healthy Daughter   . Healthy Son    History  Substance Use Topics  . Smoking status: Former Smoker -- 2 years    Types: Cigarettes    Quit date: 06/25/2004  . Smokeless tobacco: Never Used     Comment: 09/15/2013  "just smoked one cigarette, once  in awhile for ~ 2 years"  . Alcohol Use: No   OB History   Grav Para Term Preterm Abortions TAB SAB Ect Mult Living                 Review of Systems  Constitutional: Negative for fever and chills.  HENT: Positive for drooling. Negative for dental problem, rhinorrhea and sore throat.   Eyes: Negative for visual disturbance.  Respiratory: Positive for cough. Negative for shortness of breath.   Cardiovascular: Negative for chest pain and leg swelling.  Gastrointestinal: Negative for vomiting and abdominal pain.  Genitourinary: Negative for dysuria.  Musculoskeletal: Positive for back pain. Negative for neck pain.  Skin: Negative for rash.  Neurological: Negative for headaches.  Hematological: Does not bruise/bleed easily.  Psychiatric/Behavioral: Negative for confusion.      Allergies  Demerol and Ketorolac tromethamine  Home Medications   Prior to Admission medications   Medication Sig Start Date End Date Taking? Authorizing Provider  ALPRAZolam Prudy Feeler) 0.5 MG tablet Take 0.5 mg by mouth 3 (three) times daily.   Yes Historical Provider, MD  aspirin EC 81 MG tablet Take 81 mg by mouth daily.   Yes Historical Provider, MD  calcitRIOL (ROCALTROL) 0.5 MCG capsule Take 1 capsule (0.5 mcg total) by mouth daily. 09/18/13  Yes Shanker Levora Dredge, MD  calcium citrate-vitamin D 200-200 MG-UNIT TABS Take 1 tablet by mouth daily.     Yes Historical Provider, MD  carvedilol (COREG) 25 MG tablet Take 25 mg by mouth 2 (two) times daily.   Yes Historical Provider, MD  citalopram (CELEXA) 20 MG tablet Take 20 mg by mouth daily.   Yes Historical Provider, MD  donepezil (ARICEPT) 10 MG tablet Take 10 mg by mouth every evening.   Yes Historical Provider, MD  Fluticasone Furoate-Vilanterol (BREO ELLIPTA) 100-25 MCG/INH AEPB Inhale 1 puff into the lungs daily.   Yes Historical Provider, MD  furosemide (LASIX) 40 MG tablet Take 40 mg by mouth every Saturday. Takes only for swelling   Yes Historical  Provider, MD  Ipratropium-Albuterol (COMBIVENT RESPIMAT) 20-100 MCG/ACT AERS respimat Inhale 1 puff into the lungs 4 (four) times daily.   Yes Historical Provider, MD  mirtazapine (REMERON) 15 MG tablet Take 1 tablet (15 mg  total) by mouth at bedtime. 09/18/13  Yes Shanker Levora DredgeM Ghimire, MD  Multiple Vitamin (MULTIVITAMIN) tablet Take 1 tablet by mouth daily.     Yes Historical Provider, MD  nitroGLYCERIN (NITROSTAT) 0.4 MG SL tablet Place 1 tablet (0.4 mg total) under the tongue every 5 (five) minutes as needed. For chest pain 06/16/12  Yes Vesta MixerPhilip J Nahser, MD  Ranitidine HCl (ACID REDUCER PO) Take 1 tablet by mouth daily as needed (for acid reflux/gerd).    Yes Historical Provider, MD  sodium bicarbonate 650 MG tablet Take 650 mg by mouth 3 (three) times daily.    Yes Historical Provider, MD   BP 113/52  Pulse 57  Temp(Src) 98.9 F (37.2 C) (Oral)  Resp 15  Ht 5' 5.5" (1.664 m)  Wt 110 lb (49.896 kg)  BMI 18.02 kg/m2  SpO2 95% Physical Exam  Nursing note and vitals reviewed. Constitutional: She is oriented to person, place, and time. She appears well-developed and well-nourished. No distress.  HENT:  Head: Normocephalic and atraumatic.  Eyes: Conjunctivae and EOM are normal.  Neck: Neck supple. No tracheal deviation present.  Cardiovascular: Normal rate and regular rhythm.   Pulmonary/Chest: Effort normal and breath sounds normal. No respiratory distress. She has no wheezes. She has no rales.  Abdominal: Bowel sounds are normal. There is no tenderness.  Genitourinary:  Urine clear.   Musculoskeletal: Normal range of motion. She exhibits no edema.  Neurological: She is alert and oriented to person, place, and time. No cranial nerve deficit. She exhibits normal muscle tone. Coordination normal.  Skin: Skin is warm and dry.  Psychiatric: She has a normal mood and affect. Her behavior is normal.    ED Course  Procedures (including critical care time)  DIAGNOSTIC STUDIES: Oxygen  Saturation is 92% on RA, adequate by my interpretation.    COORDINATION OF CARE:  12:40 AM-Discussed treatment planwith pt at bedside and pt agreed to plan.   Labs Review Labs Reviewed  CBC WITH DIFFERENTIAL - Abnormal; Notable for the following:    WBC 14.0 (*)    RBC 3.73 (*)    Platelets 137 (*)    Neutrophils Relative % 80 (*)    Neutro Abs 11.1 (*)    Lymphocytes Relative 10 (*)    Monocytes Absolute 1.4 (*)    All other components within normal limits  COMPREHENSIVE METABOLIC PANEL - Abnormal; Notable for the following:    Sodium 134 (*)    Chloride 95 (*)    Glucose, Bld 121 (*)    BUN 51 (*)    Creatinine, Ser 2.19 (*)    Albumin 3.1 (*)    GFR calc non Af Amer 21 (*)    GFR calc Af Amer 24 (*)    All other components within normal limits  URINALYSIS, ROUTINE W REFLEX MICROSCOPIC - Abnormal; Notable for the following:    Specific Gravity, Urine <1.005 (*)    Hgb urine dipstick TRACE (*)    Nitrite POSITIVE (*)    Leukocytes, UA MODERATE (*)    All other components within normal limits  PRO B NATRIURETIC PEPTIDE - Abnormal; Notable for the following:    Pro B Natriuretic peptide (BNP) 314.7 (*)    All other components within normal limits  URINE MICROSCOPIC-ADD ON - Abnormal; Notable for the following:    Squamous Epithelial / LPF MANY (*)    Bacteria, UA MANY (*)    All other components within normal limits  URINE CULTURE  TROPONIN I  LIPASE, BLOOD   Results for orders placed during the hospital encounter of 02/01/14  CBC WITH DIFFERENTIAL      Result Value Ref Range   WBC 14.0 (*) 4.0 - 10.5 K/uL   RBC 3.73 (*) 3.87 - 5.11 MIL/uL   Hemoglobin 12.0  12.0 - 15.0 g/dL   HCT 78.2  95.6 - 21.3 %   MCV 96.5  78.0 - 100.0 fL   MCH 32.2  26.0 - 34.0 pg   MCHC 33.3  30.0 - 36.0 g/dL   RDW 08.6  57.8 - 46.9 %   Platelets 137 (*) 150 - 400 K/uL   Neutrophils Relative % 80 (*) 43 - 77 %   Neutro Abs 11.1 (*) 1.7 - 7.7 K/uL   Lymphocytes Relative 10 (*) 12 - 46 %    Lymphs Abs 1.4  0.7 - 4.0 K/uL   Monocytes Relative 10  3 - 12 %   Monocytes Absolute 1.4 (*) 0.1 - 1.0 K/uL   Eosinophils Relative 0  0 - 5 %   Eosinophils Absolute 0.1  0.0 - 0.7 K/uL   Basophils Relative 0  0 - 1 %   Basophils Absolute 0.0  0.0 - 0.1 K/uL  COMPREHENSIVE METABOLIC PANEL      Result Value Ref Range   Sodium 134 (*) 137 - 147 mEq/L   Potassium 3.7  3.7 - 5.3 mEq/L   Chloride 95 (*) 96 - 112 mEq/L   CO2 24  19 - 32 mEq/L   Glucose, Bld 121 (*) 70 - 99 mg/dL   BUN 51 (*) 6 - 23 mg/dL   Creatinine, Ser 6.29 (*) 0.50 - 1.10 mg/dL   Calcium 9.1  8.4 - 52.8 mg/dL   Total Protein 6.9  6.0 - 8.3 g/dL   Albumin 3.1 (*) 3.5 - 5.2 g/dL   AST 15  0 - 37 U/L   ALT 14  0 - 35 U/L   Alkaline Phosphatase 111  39 - 117 U/L   Total Bilirubin 0.3  0.3 - 1.2 mg/dL   GFR calc non Af Amer 21 (*) >90 mL/min   GFR calc Af Amer 24 (*) >90 mL/min   Anion gap 15  5 - 15  TROPONIN I      Result Value Ref Range   Troponin I <0.30  <0.30 ng/mL  LIPASE, BLOOD      Result Value Ref Range   Lipase 42  11 - 59 U/L  URINALYSIS, ROUTINE W REFLEX MICROSCOPIC      Result Value Ref Range   Color, Urine YELLOW  YELLOW   APPearance CLEAR  CLEAR   Specific Gravity, Urine <1.005 (*) 1.005 - 1.030   pH 6.5  5.0 - 8.0   Glucose, UA NEGATIVE  NEGATIVE mg/dL   Hgb urine dipstick TRACE (*) NEGATIVE   Bilirubin Urine NEGATIVE  NEGATIVE   Ketones, ur NEGATIVE  NEGATIVE mg/dL   Protein, ur NEGATIVE  NEGATIVE mg/dL   Urobilinogen, UA 0.2  0.0 - 1.0 mg/dL   Nitrite POSITIVE (*) NEGATIVE   Leukocytes, UA MODERATE (*) NEGATIVE  PRO B NATRIURETIC PEPTIDE      Result Value Ref Range   Pro B Natriuretic peptide (BNP) 314.7 (*) 0 - 125 pg/mL  URINE MICROSCOPIC-ADD ON      Result Value Ref Range   Squamous Epithelial / LPF MANY (*) RARE   WBC, UA 7-10  <3 WBC/hpf   RBC / HPF 3-6  <3 RBC/hpf  Bacteria, UA MANY (*) RARE     Imaging Review Ct Abdomen Pelvis Wo Contrast  02/01/2014   CLINICAL DATA:   Flank pain.  Stage III renal disease.  EXAM: CT ABDOMEN AND PELVIS WITHOUT CONTRAST  TECHNIQUE: Multidetector CT imaging of the abdomen and pelvis was performed following the standard protocol without IV contrast.  COMPARISON:  Renal ultrasound performed 05/19/2010, and CT of the left hip performed 01/14/2012; CT of the abdomen and pelvis from 11/16/2009  FINDINGS: Minimal bibasilar atelectasis is noted.  The liver and spleen are unremarkable in appearance. The patient appears to be status post cholecystectomy. The pancreas and adrenal glands are unremarkable.  The patient is status post left-sided nephrectomy. There is mild left renal atrophy and scarring. Mild to moderate left-sided hydronephrosis is seen, grossly unchanged from the prior studies, without evidence of a distal obstructing stone. This may reflect the patient's urostomy.  No renal or ureteral stones are identified. Mild nonspecific left-sided perinephric stranding is noted.  No free fluid is identified. The remaining small bowel is grossly unremarkable. The stomach is within normal limits. No acute vascular abnormalities are seen. Scattered calcification is seen along the abdominal aorta and its branches.  The patient's urostomy is difficult to fully assess, as it appears largely decompressed. No focal abnormalities are seen.  The patient is status post appendectomy. The colon is partially filled with stool and is unremarkable in appearance.  The bladder is decompressed and not well assessed. The patient is status post hysterectomy. No suspicious adnexal masses are seen. No inguinal lymphadenopathy is seen.  No acute osseous abnormalities are identified. A left hip arthroplasty is partially imaged and appears grossly unremarkable.  IMPRESSION: 1. No acute abnormality seen to explain the patient's symptoms. 2. Persistent mild to moderate left-sided hydronephrosis, grossly unchanged from 2011, without evidence of a distal obstructing stone. Underlying  urostomy is grossly unremarkable, but not well assessed. 3. Mild left renal atrophy and scarring noted. 4. Scattered calcification along the abdominal aorta and its branches.   Electronically Signed   By: Roanna Raider M.D.   On: 02/01/2014 23:24   Dg Chest 2 View  02/01/2014   CLINICAL DATA:  Flank pain.  Drooling.  Abnormal affect.  EXAM: CHEST  2 VIEW  COMPARISON:  Chest x-ray 09/15/2013.  FINDINGS: Lung volumes are low. No consolidative airspace disease. No pleural effusions. No pneumothorax. No pulmonary nodule or mass noted. Pulmonary vasculature and the cardiomediastinal silhouette are within normal limits.  IMPRESSION: 1. Low lung volumes without radiographic evidence of acute cardiopulmonary disease.   Electronically Signed   By: Trudie Reed M.D.   On: 02/01/2014 23:37   Ct Head Wo Contrast  02/01/2014   CLINICAL DATA:  Drooling.  Abnormal affect.  EXAM: CT HEAD WITHOUT CONTRAST  TECHNIQUE: Contiguous axial images were obtained from the base of the skull through the vertex without intravenous contrast.  COMPARISON:  None.  FINDINGS: Cavum septum pellucidum et vergae (normal anatomical variant) incidentally noted. Mild cerebral and cerebellar atrophy. Patchy and confluent areas of decreased attenuation are noted throughout the deep and periventricular white matter of the cerebral hemispheres bilaterally, compatible with chronic microvascular ischemic disease. No acute intracranial abnormalities. Specifically, no evidence of acute intracranial hemorrhage, no definite findings of acute/subacute cerebral ischemia, no mass, mass effect, hydrocephalus or abnormal intra or extra-axial fluid collections. Visualized paranasal sinuses and mastoids are well pneumatized. No acute displaced skull fractures are identified.  IMPRESSION: 1. No acute intracranial abnormalities. 2. Mild cerebral and cerebellar  atrophy with chronic microvascular ischemic changes in the cerebral white matter, similar to the prior  examination.   Electronically Signed   By: Trudie Reed M.D.   On: 02/01/2014 23:20     EKG Interpretation None      MDM   Final diagnoses:  Renal insufficiency  Dehydration  Acute on chronic renal insufficiency  UTI (lower urinary tract infection)    Patient had to have her right kidney and bladder removed due to complications following childbirth. Patient has ileal conduit. Patient's daughter states that when she gets confused like this that is usually due to urinary tract infection. She usually gets back pain. Patient alert no neuro focal deficits. Patient was found at home with heating pad on her back. Workup here is suspicious for probable urinary tract infection. CT of head without any acute abnormalities. Chest x-ray without evidence of pneumonia. CT scan of abdomen and pelvis without contrast without any significant changes compared to the scan performed in November of 2011. Urine has been cultured. Realize it is an ileal conduit day usually is contaminated but does have positive nitrite. We'll treat with Rocephin. Patient also showing signs of renal insufficiency or acute on chronic renal failure probably due to dehydration.  I personally performed the services described in this documentation, which was scribed in my presence. The recorded information has been reviewed and is accurate.     Vanetta Mulders, MD 02/02/14 [id number]  Vanetta Mulders, MD 02/02/14 [phone]

## 2014-02-02 ENCOUNTER — Inpatient Hospital Stay (HOSPITAL_COMMUNITY): Payer: Medicare Other

## 2014-02-02 ENCOUNTER — Encounter (HOSPITAL_COMMUNITY): Payer: Self-pay

## 2014-02-02 ENCOUNTER — Ambulatory Visit: Payer: Self-pay | Admitting: Cardiovascular Disease

## 2014-02-02 DIAGNOSIS — N183 Chronic kidney disease, stage 3 unspecified: Secondary | ICD-10-CM | POA: Diagnosis present

## 2014-02-02 DIAGNOSIS — N390 Urinary tract infection, site not specified: Secondary | ICD-10-CM | POA: Diagnosis present

## 2014-02-02 DIAGNOSIS — Z905 Acquired absence of kidney: Secondary | ICD-10-CM | POA: Diagnosis not present

## 2014-02-02 DIAGNOSIS — K219 Gastro-esophageal reflux disease without esophagitis: Secondary | ICD-10-CM | POA: Diagnosis present

## 2014-02-02 DIAGNOSIS — G9349 Other encephalopathy: Secondary | ICD-10-CM | POA: Diagnosis present

## 2014-02-02 DIAGNOSIS — N2581 Secondary hyperparathyroidism of renal origin: Secondary | ICD-10-CM | POA: Diagnosis present

## 2014-02-02 DIAGNOSIS — Z681 Body mass index (BMI) 19 or less, adult: Secondary | ICD-10-CM | POA: Diagnosis not present

## 2014-02-02 DIAGNOSIS — Z87891 Personal history of nicotine dependence: Secondary | ICD-10-CM | POA: Diagnosis not present

## 2014-02-02 DIAGNOSIS — F0390 Unspecified dementia without behavioral disturbance: Secondary | ICD-10-CM | POA: Diagnosis present

## 2014-02-02 DIAGNOSIS — I4891 Unspecified atrial fibrillation: Secondary | ICD-10-CM | POA: Diagnosis present

## 2014-02-02 DIAGNOSIS — I5023 Acute on chronic systolic (congestive) heart failure: Secondary | ICD-10-CM | POA: Diagnosis present

## 2014-02-02 DIAGNOSIS — Z96649 Presence of unspecified artificial hip joint: Secondary | ICD-10-CM | POA: Diagnosis not present

## 2014-02-02 DIAGNOSIS — E43 Unspecified severe protein-calorie malnutrition: Secondary | ICD-10-CM | POA: Diagnosis present

## 2014-02-02 DIAGNOSIS — M129 Arthropathy, unspecified: Secondary | ICD-10-CM | POA: Diagnosis present

## 2014-02-02 DIAGNOSIS — R5381 Other malaise: Secondary | ICD-10-CM | POA: Diagnosis present

## 2014-02-02 DIAGNOSIS — Z9049 Acquired absence of other specified parts of digestive tract: Secondary | ICD-10-CM | POA: Diagnosis not present

## 2014-02-02 DIAGNOSIS — I509 Heart failure, unspecified: Secondary | ICD-10-CM | POA: Diagnosis present

## 2014-02-02 DIAGNOSIS — N289 Disorder of kidney and ureter, unspecified: Secondary | ICD-10-CM

## 2014-02-02 DIAGNOSIS — I129 Hypertensive chronic kidney disease with stage 1 through stage 4 chronic kidney disease, or unspecified chronic kidney disease: Secondary | ICD-10-CM | POA: Diagnosis present

## 2014-02-02 DIAGNOSIS — Z9181 History of falling: Secondary | ICD-10-CM | POA: Diagnosis not present

## 2014-02-02 DIAGNOSIS — I252 Old myocardial infarction: Secondary | ICD-10-CM | POA: Diagnosis not present

## 2014-02-02 DIAGNOSIS — Z9089 Acquired absence of other organs: Secondary | ICD-10-CM | POA: Diagnosis not present

## 2014-02-02 DIAGNOSIS — E860 Dehydration: Secondary | ICD-10-CM | POA: Diagnosis present

## 2014-02-02 DIAGNOSIS — N179 Acute kidney failure, unspecified: Secondary | ICD-10-CM | POA: Diagnosis present

## 2014-02-02 DIAGNOSIS — G20 Parkinson's disease: Secondary | ICD-10-CM | POA: Diagnosis present

## 2014-02-02 DIAGNOSIS — Z8249 Family history of ischemic heart disease and other diseases of the circulatory system: Secondary | ICD-10-CM | POA: Diagnosis not present

## 2014-02-02 DIAGNOSIS — F329 Major depressive disorder, single episode, unspecified: Secondary | ICD-10-CM | POA: Diagnosis present

## 2014-02-02 DIAGNOSIS — Z808 Family history of malignant neoplasm of other organs or systems: Secondary | ICD-10-CM | POA: Diagnosis not present

## 2014-02-02 LAB — URINE MICROSCOPIC-ADD ON

## 2014-02-02 LAB — URINALYSIS, ROUTINE W REFLEX MICROSCOPIC
Bilirubin Urine: NEGATIVE
Glucose, UA: NEGATIVE mg/dL
KETONES UR: NEGATIVE mg/dL
NITRITE: POSITIVE — AB
Protein, ur: NEGATIVE mg/dL
Specific Gravity, Urine: <1.005 — ABNORMAL LOW (ref 1.005–1.030)
UROBILINOGEN UA: 0.2 mg/dL (ref 0.0–1.0)
pH: 6.5 (ref 5.0–8.0)

## 2014-02-02 LAB — BASIC METABOLIC PANEL
Anion gap: 13 (ref 5–15)
BUN: 49 mg/dL — ABNORMAL HIGH (ref 6–23)
CHLORIDE: 100 meq/L (ref 96–112)
CO2: 26 meq/L (ref 19–32)
Calcium: 8.4 mg/dL (ref 8.4–10.5)
Creatinine, Ser: 2.09 mg/dL — ABNORMAL HIGH (ref 0.50–1.10)
GFR calc Af Amer: 26 mL/min — ABNORMAL LOW (ref >90)
GFR calc non Af Amer: 22 mL/min — ABNORMAL LOW (ref >90)
GLUCOSE: 100 mg/dL — AB (ref 70–99)
POTASSIUM: 3.5 meq/L — AB (ref 3.7–5.3)
SODIUM: 139 meq/L (ref 137–147)

## 2014-02-02 LAB — LIPID PANEL
CHOL/HDL RATIO: 2.9 ratio
CHOLESTEROL: 157 mg/dL (ref 0–200)
HDL: 55 mg/dL (ref >39)
LDL Cholesterol: 90 mg/dL (ref 0–99)
TRIGLYCERIDES: 58 mg/dL (ref <150)
VLDL: 12 mg/dL (ref 0–40)

## 2014-02-02 LAB — HEMOGLOBIN A1C
Hgb A1c MFr Bld: 5.6 % (ref <5.7)
Mean Plasma Glucose: 114 mg/dL (ref <117)

## 2014-02-02 MED ORDER — CARVEDILOL 12.5 MG PO TABS
25.0000 mg | ORAL_TABLET | Freq: Two times a day (BID) | ORAL | Status: DC
  Administered 2014-02-02 – 2014-02-05 (×8): 25 mg via ORAL
  Filled 2014-02-02 (×2): qty 2
  Filled 2014-02-02: qty 1
  Filled 2014-02-02 (×6): qty 2
  Filled 2014-02-02 (×2): qty 1

## 2014-02-02 MED ORDER — ASPIRIN EC 81 MG PO TBEC
81.0000 mg | DELAYED_RELEASE_TABLET | Freq: Every day | ORAL | Status: DC
  Administered 2014-02-03 – 2014-02-08 (×6): 81 mg via ORAL
  Filled 2014-02-02 (×8): qty 1

## 2014-02-02 MED ORDER — CALCIUM CITRATE-VITAMIN D 200-200 MG-UNIT PO TABS: 1.0000 | ORAL_TABLET | Freq: Every day | ORAL | Status: DC

## 2014-02-02 MED ORDER — IPRATROPIUM-ALBUTEROL 20-100 MCG/ACT IN AERS: 1.0000 | INHALATION_SPRAY | Freq: Four times a day (QID) | RESPIRATORY_TRACT | Status: DC

## 2014-02-02 MED ORDER — CITALOPRAM HYDROBROMIDE 20 MG PO TABS
20.0000 mg | ORAL_TABLET | Freq: Every day | ORAL | Status: DC
  Administered 2014-02-02 – 2014-02-08 (×7): 20 mg via ORAL
  Filled 2014-02-02 (×10): qty 1

## 2014-02-02 MED ORDER — MIRTAZAPINE 30 MG PO TABS
15.0000 mg | ORAL_TABLET | Freq: Every day | ORAL | Status: DC
  Administered 2014-02-02 – 2014-02-07 (×6): 15 mg via ORAL
  Filled 2014-02-02 (×8): qty 1

## 2014-02-02 MED ORDER — FLUTICASONE FUROATE-VILANTEROL 100-25 MCG/INH IN AEPB: 1.0000 | INHALATION_SPRAY | Freq: Every day | RESPIRATORY_TRACT | Status: DC

## 2014-02-02 MED ORDER — HEPARIN SODIUM (PORCINE) 5000 UNIT/ML IJ SOLN
5000.0000 [IU] | Freq: Three times a day (TID) | INTRAMUSCULAR | Status: DC
  Administered 2014-02-02 – 2014-02-05 (×10): 5000 [IU] via SUBCUTANEOUS
  Filled 2014-02-02 (×10): qty 1

## 2014-02-02 MED ORDER — CETYLPYRIDINIUM CHLORIDE 0.05 % MT LIQD
7.0000 mL | Freq: Two times a day (BID) | OROMUCOSAL | Status: DC
  Administered 2014-02-02 – 2014-02-08 (×12): 7 mL via OROMUCOSAL

## 2014-02-02 MED ORDER — DONEPEZIL HCL 5 MG PO TABS
10.0000 mg | ORAL_TABLET | Freq: Every evening | ORAL | Status: DC
  Administered 2014-02-02 – 2014-02-07 (×6): 10 mg via ORAL
  Filled 2014-02-02 (×5): qty 2
  Filled 2014-02-02: qty 1
  Filled 2014-02-02: qty 2

## 2014-02-02 MED ORDER — SODIUM BICARBONATE 650 MG PO TABS
650.0000 mg | ORAL_TABLET | Freq: Three times a day (TID) | ORAL | Status: DC
  Administered 2014-02-02 – 2014-02-08 (×19): 650 mg via ORAL
  Filled 2014-02-02 (×23): qty 1

## 2014-02-02 MED ORDER — SODIUM CHLORIDE 0.9 % IV SOLN
INTRAVENOUS | Status: AC
  Administered 2014-02-02 (×2): via INTRAVENOUS

## 2014-02-02 MED ORDER — CALCITRIOL 0.25 MCG PO CAPS
0.5000 ug | ORAL_CAPSULE | Freq: Every day | ORAL | Status: DC
  Administered 2014-02-02 – 2014-02-08 (×7): 0.5 ug via ORAL
  Filled 2014-02-02 (×5): qty 2
  Filled 2014-02-02: qty 1
  Filled 2014-02-02 (×2): qty 2
  Filled 2014-02-02: qty 1

## 2014-02-02 MED ORDER — MOMETASONE FURO-FORMOTEROL FUM 100-5 MCG/ACT IN AERO
2.0000 | INHALATION_SPRAY | Freq: Two times a day (BID) | RESPIRATORY_TRACT | Status: DC
  Administered 2014-02-02 – 2014-02-08 (×12): 2 via RESPIRATORY_TRACT
  Filled 2014-02-02: qty 8.8

## 2014-02-02 MED ORDER — ACETAMINOPHEN 325 MG PO TABS
650.0000 mg | ORAL_TABLET | Freq: Four times a day (QID) | ORAL | Status: DC | PRN
  Administered 2014-02-02 – 2014-02-07 (×8): 650 mg via ORAL
  Filled 2014-02-02 (×8): qty 2

## 2014-02-02 MED ORDER — ALPRAZOLAM 0.5 MG PO TABS
0.5000 mg | ORAL_TABLET | Freq: Three times a day (TID) | ORAL | Status: DC
  Administered 2014-02-02 – 2014-02-08 (×19): 0.5 mg via ORAL
  Filled 2014-02-02 (×19): qty 1

## 2014-02-02 MED ORDER — IPRATROPIUM-ALBUTEROL 0.5-2.5 (3) MG/3ML IN SOLN
3.0000 mL | Freq: Four times a day (QID) | RESPIRATORY_TRACT | Status: DC
  Administered 2014-02-02 – 2014-02-08 (×23): 3 mL via RESPIRATORY_TRACT
  Filled 2014-02-02 (×25): qty 3

## 2014-02-02 MED ORDER — DEXTROSE 5 % IV SOLN
1.0000 g | Freq: Once | INTRAVENOUS | Status: AC
  Administered 2014-02-02: 1 g via INTRAVENOUS
  Filled 2014-02-02: qty 10

## 2014-02-02 MED ORDER — SODIUM CHLORIDE 0.9 % IV SOLN: INTRAVENOUS | Status: AC

## 2014-02-02 MED ORDER — SENNOSIDES-DOCUSATE SODIUM 8.6-50 MG PO TABS
1.0000 | ORAL_TABLET | Freq: Every evening | ORAL | Status: DC | PRN
  Administered 2014-02-05 – 2014-02-07 (×2): 1 via ORAL
  Filled 2014-02-02 (×3): qty 1

## 2014-02-02 MED ORDER — CALCIUM CARBONATE-VITAMIN D 500-200 MG-UNIT PO TABS
1.0000 | ORAL_TABLET | Freq: Every day | ORAL | Status: DC
  Administered 2014-02-02 – 2014-02-08 (×7): 1 via ORAL
  Filled 2014-02-02 (×9): qty 1

## 2014-02-02 MED ORDER — DEXTROSE 5 % IV SOLN
1.0000 g | INTRAVENOUS | Status: DC
  Administered 2014-02-02 – 2014-02-04 (×3): 1 g via INTRAVENOUS
  Filled 2014-02-02 (×6): qty 10

## 2014-02-02 NOTE — Care Management Note (Addendum)
    Page 1 of 2   02/08/2014     11:52:52 AM CARE MANAGEMENT NOTE 02/08/2014  Patient:  [name],[name]   Account Number:  [id number]  Date Initiated:  02/02/2014  Documentation initiated by:  Sharrie RothmanBLACKWELL,TAMMY C  Subjective/Objective Assessment:   Pt admitted from home with UTI. Pt lives alone and will return home at discharge. Pt has 2 daughters who are very active in the care of the pt. Pt is fairly independent with ADL's. Pt has a cane and walker for home use. Has used [facility] in the     Action/Plan:   past. Will continue to follow for discharge planning needs. Pt may need HH at discharge.   Anticipated DC Date:  02/08/2014   Anticipated DC Plan:  HOME W HOME HEALTH SERVICES      DC Planning Services  CM consult      [facility]  [facility] HEALTH   Choice offered to / List presented to:  C-1 Patient        HH arranged  HH-1 RN  HH-2 PT  HH-3 OT      [facility]  [facility]   Status of service:  Completed, signed off Medicare Important Message given?  YES (If response is "NO", the following Medicare IM given date fields will be blank) Date Medicare IM given:  02/05/2014 Medicare IM given by:  Arlyss QueenBLACKWELL,TAMMY C Date Additional Medicare IM given:  02/08/2014 Additional Medicare IM given by:  JESSICA CHILDRESS  [facility]  Per UR Regulation:    If discussed at Long Length of Stay Meetings, dates discussed:    Comments:  02/08/2014 1100 Kathyrn SheriffJessica Childress, RN, MSN, PCCN Patient [facility]. Alroy BailiffLinda Lothian, of [facility], has been notified of [facility] RN, PT/OT referral, Bonita QuinLinda will obtain pt information from chart. Patient, pt's family, and pt's RN aware of HH arrangements. [facility] come make first visit. Patient does not meet requirements for [facility] needs. Patient has no further CM needs at this time.  02/05/14 1125 Arlyss Queenammy Blackwell, RN BSN CM Pt now with A fib. Pt  discharge held for cardiology consult and echo. [facility]. Weekend staff can arrange if pt d/c over the weekend.  02/02/14 1130 Arlyss Queenammy Blackwell, RN BSN CM

## 2014-02-02 NOTE — Progress Notes (Signed)
Patient admitted after midnight, please see H&P.  Patient is alert and oriented  Acute encephalopathy with reported lethargy at home, likely multifactorial  - urinalysis hard to interpret given ileal conduit, no history of MDRs but given mental status changes will treat with empiric ceftriaxone  - given possible facial droop and weakness obtain brain MRI    Acute on chronic renal failure - patient with LE swelling few days ago and used PRN Lasix, now mildly dehydrated, gentle IVF and repeat BMP in am  - baseline Cr ~1.6   Depression - wonder how much recent death in the family is affecting and contributing to #1 .  - continue home medications    Chronic systolic heart failure  - EF in the past ~25%, improved to 55-60% on most recent echo  - no evidence of fluid overload    HTN - continue home medications with holding parameters  Marlin CanaryJessica Rosaleah Person DO

## 2014-02-02 NOTE — Evaluation (Signed)
Clinical/Bedside Swallow Evaluation  Patient Details  Name: [name] MRN: [id number] Date of Birth: 10/16/1940  Today's Date: 02/02/2014 Time: 1191-4782 SLP Time Calculation (min): 33 min  Past Medical History:  Past Medical History  Diagnosis Date  . Gastroesophageal reflux   . Chronic UTI   . AV fistula     clotted in her left forearm  . Cellulitis   . Pneumothorax     secondary to central line placement  . Myocardial infarction 2011; 2012  . Numbness and tingling of both legs   . Anginal pain   . Heart murmur   . Dysrhythmia     "heart beats real fast"  . Chronic anemia   . Pneumonia     "one time"  . Chronic chest pain 01/14/12    1-2 X/day/H&P  . Arthritis     "at different places"  . Anxiety   . Depression   . Headache(784.0)     "alot; not daily"  . Frequent falls   . Dementia   . Collagen vascular disease   . S/P ileal conduit     post cystectomy/ileal conduit  . Anemia     Received IV Feraheme (x) 2 at [facility]  . Iron deficiency     Received IV Feraheme (x) 2 at [facility]  . Dysarthria 03/31/13    Speech is almost a little bit dysarthritic  . Unsteady     Stronger since she came out of nursing home & is walking with a cane  . Edema 03/31/13    Gained 9 pounds overnight & telephone nurse instructed her to take 20mg  of Lasix bid on 03/30/13. Still has swelling in legs.  . Metabolic acidosis     Chronic  . Hypertension     Good control  . CHF (congestive heart failure)     Systolic heart failure EF 25-30% by ECHO 2012  . Altered mental state     Recurrent episodes, with confusion variously related to narcotic use, infection, etc. and has required hospitilization, nursing home stays for rehab related to weakness, encephalopathy, etc.  . History of sepsis 2012    With E.Coli bacteremia  . E coli bacteremia 2012    History of sepsis & E.Coli bacteremia  . Secondary hyperparathyroidism     Dr. Caryn Section had a sestamibi scan of her neck in May 2014  which was negative for parathyroid activity in her neck.  . Chronic kidney disease (CKD), stage III (moderate)     [name] 09/15/2013  . Hydronephrosis     She has chronic mild left   . Single kidney     post right nephrectomy  . Renal insufficiency    Past Surgical History:  Past Surgical History  Procedure Laterality Date  . Hip arthroplasty      left; S/P fracture  . Rod left leg  2012    left hip "to almost my knee; for my hip"  . Creation / revision of ileostomy / jejunostomy  1973  . Nephrectomy  1973    right  . Fracture surgery    . Partial colectomy  1970  . Tonsillectomy and adenoidectomy      "when I was a kid"  . Cholecystectomy  2000  . Appendectomy  2000  . Abdominal hysterectomy    . Dilation and curettage of uterus    . Cesarean section  1969    only 4th of 4 children was c-section  . Av fistula placement  1970's  LFA  . Bladder removal  1974    with ileal conduit and urostomy   HPI:  [name] is a 73 y.o. 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 Patient has a history of significant anxiety and depression, and last time she was hospitalized in March of 2015 with worsening of her psychiatric symptoms shortly after her husband passed away. Her daughters tell me that patient's sister died today from brain cancer. Over the last couple weeks, patient has been very withdrawn and with flat affect. Her daughter also tells me that today she seemed to be weaker and has noticed patient to be lethargic and with saliva drooping out of her mouth. She was also concerned that she has been weaker and she thinks patient's right eye is "different" as well as  the corner of her mouth on right is "different" and was also concerned for a stroke. Patient is very flat in the ED, the only thing that she endorses is feeling "sad" and having worse back pain. Family associates her back pain with a UTI which she has had in the past, however never being this lethargic. In the ED, labs show acute on chronic renal failure and mild leukocytosis of 14.   Assessment / Plan / Recommendation Clinical Impression  Ms. Weinand's daughters provided recent background information, stating that their mother "was not herself" and choked while eating lunch today (jello). Oral motor exam unremarkable except for possible right eye droop. Pt tolerated thin, puree, and regular textures without incident. Daughters report that she consumed pizza prior to SLP arrival and no problem. Pt's swallow may be intermittently effected by lethargy, thus family was encouraged to have pt self feed when possible (they were feeding her today) and ensure that pt is alert and upright. Risk for aspiration appears low, however SLP will follow during acute stay for diet tolerance and assistance as needed. Continue diet as ordered (heart healthy regular textures and thin liquids).     Aspiration Risk  Mild    Diet Recommendation Regular;Thin liquid   Liquid Administration via: Cup;Straw Medication Administration: Whole meds with liquid Supervision: Patient able to self feed;Intermittent supervision to cue for compensatory strategies Compensations: Slow rate Postural Changes and/or Swallow Maneuvers: Seated upright 90 degrees;Upright 30-60 min after meal    Other  Recommendations Oral Care Recommendations: Oral care BID Other Recommendations: Clarify dietary restrictions   Follow Up Recommendations  None    Frequency and Duration min 2x/week  1 week   Pertinent Vitals/Pain VSS     Swallow Study Prior Functional Status   Lived at home, daughters assist    General Date of Onset: 02/01/14 HPI:  [name] is a 73 y.o. 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 Patient has a history of significant anxiety and depression, and last time she was hospitalized in March of 2015 with worsening of her psychiatric symptoms shortly after her husband passed away. Her daughters tell  me that patient's sister died today from brain cancer. Over the last couple weeks, patient has been very withdrawn and with flat affect. Her daughter also tells me that today she seemed to be weaker and has noticed patient to be lethargic and with saliva drooping out of her mouth. She was also concerned that she has been weaker and she thinks patient's right eye is "different" as well as the corner of her mouth on right is "different" and was also concerned for a stroke. Patient is very flat in the ED, the only thing that she endorses is feeling "sad" and having worse back pain. Family associates her back pain with a UTI which she has had in the past, however never being this lethargic. In the ED, labs show acute on chronic renal failure and mild leukocytosis of 14. Type of Study: Bedside swallow evaluation Diet Prior to this Study: Regular;Thin liquids Temperature Spikes Noted: No Respiratory Status: Room air History of Recent Intubation: No Behavior/Cognition: Alert;Cooperative Oral Cavity - Dentition:  (missing lower denture/partial but adequate) Self-Feeding Abilities: Able to feed self Patient Positioning: Upright in bed Baseline Vocal Quality: Clear;Low vocal intensity Volitional Cough: Strong Volitional Swallow: Able to elicit    Oral/Motor/Sensory Function Overall Oral Motor/Sensory Function:  Impaired Labial ROM: Within Functional Limits Labial Symmetry: Within Functional Limits Labial Strength: Within Functional Limits Labial Sensation: Within Functional Limits Lingual ROM: Within Functional Limits (tremor) Lingual Symmetry: Within Functional Limits Lingual Strength: Within Functional Limits Lingual Sensation: Within Functional Limits Facial ROM: Reduced right (mild right eye droop) Facial Symmetry: Right drooping eyelid Facial Strength: Within Functional Limits Facial Sensation: Within Functional Limits Velum: Within Functional Limits Mandible: Within Functional Limits   Ice Chips Ice chips: Within functional limits Presentation: Spoon   Thin Liquid Thin Liquid: Within functional limits Presentation: Cup;Self Fed;Straw    Nectar Thick Nectar Thick Liquid: Not tested   Honey Thick Honey Thick Liquid: Not tested   Puree Puree: Within functional limits Presentation: Spoon   Solid       Solid: Within functional limits Presentation: Self Fed      Thank you,  Havery MorosDabney Clavin Ruhlman, CCC-SLP [phone]  Clemie General 02/02/2014,5:18 PM

## 2014-02-02 NOTE — Progress Notes (Signed)
Patient with fever 100.47F Tylenol given with no effect. Dr. Benjamine MolaVann notified

## 2014-02-02 NOTE — ED Notes (Signed)
Pt. Family reports that pt. Has not been herself "for past few days". Pt. Family reports that pt. Has been drooling some. Pt. Family reports that she has been grieving the loss of her husband several months ago. Pt. Alert and oriented x4. Pt. Moving all extremities equally.

## 2014-02-02 NOTE — H&P (Signed)
History and Physical    [name]:[id number] DOB: 05-22-1941 DOA: 02/01/2014  Referring physician: Dr. Deretha Emory PCP: Toma Deiters, MD  Specialists: none  Chief Complaint: brought by family for lethargy  HPI: [name] is a 73 y.o. 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 Patient has a history of significant anxiety and depression, and last time she was hospitalized in March of 2015 with worsening of her psychiatric symptoms shortly after her husband passed away. Her daughters tell me that patient's sister died today from brain cancer. Over the last couple weeks, patient has been very withdrawn and with flat affect. Her daughter also tells me that today she seemed to be weaker and has noticed patient to be lethargic and with saliva drooping out of her mouth. She was also concerned that she has been weaker and she thinks patient's right eye is "different" as well as the corner of her mouth on right is "different" and was also concerned for a stroke. Patient is very flat in the ED, the only thing that she endorses is feeling "sad" and having worse back pain. Family associates her back pain with a UTI which she has had in the past, however never being this lethargic. In the ED, labs show acute on chronic renal failure and mild leukocytosis of 14.   Review of Systems: As per HPI otherwise negative  Past Medical History  Diagnosis Date  . Gastroesophageal reflux   . Chronic UTI   . AV fistula     clotted in her left forearm  . Cellulitis   . Pneumothorax     secondary to central line placement  . Myocardial infarction 2011; 2012  . Numbness and tingling of  both legs   . Anginal pain   . Heart murmur   . Dysrhythmia     "heart beats real fast"  . Chronic anemia   . Pneumonia     "one time"  . Chronic chest pain 01/14/12    1-2 X/day/H&P  . Arthritis     "at different places"  . Anxiety   . Depression   . Headache(784.0)     "alot; not daily"  . Frequent falls   . Dementia   . Collagen vascular disease   . S/P ileal conduit     post cystectomy/ileal conduit  . Anemia     Received IV Feraheme (x) 2 at [facility]  . Iron deficiency     Received IV Feraheme (x) 2 at [facility]  . Dysarthria 03/31/13    Speech is almost a little bit dysarthritic  . Unsteady     Stronger since she came out of nursing home & is walking with a cane  . Edema 03/31/13    Gained 9 pounds overnight & telephone nurse instructed her to take 20mg  of Lasix bid on 03/30/13. Still has swelling in legs.  . Metabolic acidosis     Chronic  . Hypertension     Good control  . CHF (congestive heart failure)     Systolic heart failure EF 25-30% by ECHO 2012  . Altered mental state     Recurrent episodes, with confusion variously related to narcotic use, infection, etc. and  has required hospitilization, nursing home stays for rehab related to weakness, encephalopathy, etc.  . History of sepsis 2012    With E.Coli bacteremia  . E coli bacteremia 2012    History of sepsis & E.Coli bacteremia  . Secondary hyperparathyroidism     Dr. Caryn SectionFox had a sestamibi scan of her neck in May 2014 which was negative for parathyroid activity in her neck.  . Chronic kidney disease (CKD), stage III (moderate)     [name]/notes 09/15/2013  . Hydronephrosis     She has chronic mild left   . Single kidney     post right nephrectomy  . Renal insufficiency    Past Surgical History  Procedure Laterality Date  . Hip arthroplasty      left; S/P fracture  . Rod left leg  2012    left hip "to almost my knee; for my hip"  . Creation / revision of ileostomy / jejunostomy  1973  . Nephrectomy  1973     right  . Fracture surgery    . Partial colectomy  1970  . Tonsillectomy and adenoidectomy      "when I was a kid"  . Cholecystectomy  2000  . Appendectomy  2000  . Abdominal hysterectomy    . Dilation and curettage of uterus    . Cesarean section  1969    only 4th of 4 children was c-section  . Av fistula placement  1970's    LFA  . Bladder removal  1974    with ileal conduit and urostomy   Social History:  reports that she quit smoking about 9 years ago. Her smoking use included Cigarettes. She smoked 0.00 packs per day for 2 years. She has never used smokeless tobacco. She reports that she does not drink alcohol or use illicit drugs.  Allergies  Allergen Reactions  . Demerol [Meperidine] Other (See Comments)    Reaction is unknown  . Ketorolac Tromethamine Rash    Family History  Problem Relation Age of Onset  . Heart attack Mother   . Healthy Daughter   . Healthy Daughter   . Healthy Son    Prior to Admission medications   Medication Sig Start Date End Date Taking? Authorizing Provider  ALPRAZolam Prudy Feeler(XANAX) 0.5 MG tablet Take 0.5 mg by mouth 3 (three) times daily.   Yes Historical Provider, MD  aspirin EC 81 MG tablet Take 81 mg by mouth daily.   Yes Historical Provider, MD  calcitRIOL (ROCALTROL) 0.5 MCG capsule Take 1 capsule (0.5 mcg total) by mouth daily. 09/18/13  Yes Shanker Levora DredgeM Ghimire, MD  calcium citrate-vitamin D 200-200 MG-UNIT TABS Take 1 tablet by mouth daily.     Yes Historical Provider, MD  carvedilol (COREG) 25 MG tablet Take 25 mg by mouth 2 (two) times daily.   Yes Historical Provider, MD  citalopram (CELEXA) 20 MG tablet Take 20 mg by mouth daily.   Yes Historical Provider, MD  donepezil (ARICEPT) 10 MG tablet Take 10 mg by mouth every evening.   Yes Historical Provider, MD  Fluticasone Furoate-Vilanterol (BREO ELLIPTA) 100-25 MCG/INH AEPB Inhale 1 puff into the lungs daily.   Yes Historical Provider, MD  furosemide (LASIX) 40 MG tablet Take 40 mg by  mouth every Saturday. Takes only for swelling   Yes Historical Provider, MD  Ipratropium-Albuterol (COMBIVENT RESPIMAT) 20-100 MCG/ACT AERS respimat Inhale 1 puff into the lungs 4 (four) times daily.   Yes Historical Provider, MD  mirtazapine (REMERON) 15 MG tablet  Take 1 tablet (15 mg total) by mouth at bedtime. 09/18/13  Yes Shanker Levora Dredge, MD  Multiple Vitamin (MULTIVITAMIN) tablet Take 1 tablet by mouth daily.     Yes Historical Provider, MD  nitroGLYCERIN (NITROSTAT) 0.4 MG SL tablet Place 1 tablet (0.4 mg total) under the tongue every 5 (five) minutes as needed. For chest pain 06/16/12  Yes Vesta Mixer, MD  Ranitidine HCl (ACID REDUCER PO) Take 1 tablet by mouth daily as needed (for acid reflux/gerd).    Yes Historical Provider, MD  sodium bicarbonate 650 MG tablet Take 650 mg by mouth 3 (three) times daily.    Yes Historical Provider, MD   Physical Exam: Filed Vitals:   02/01/14 2014 02/01/14 2120 02/02/14 0053 02/02/14 0120  BP: 131/55 113/52 108/50   Pulse: 60 57 58 60  Temp: 98.9 F (37.2 C)     TempSrc: Oral     Resp: 16 15 16 21   Height: 5' 5.5" (1.664 m)     Weight: 49.896 kg (110 lb)     SpO2: 92% 95% 96% 94%     General:  Not in distress  Eyes: no scleral icterus  ENT: moist oropharynx  Neck: supple, no JVD  Cardiovascular: regular rate without MRG; 2+ peripheral pulses  Respiratory: clear, no wheezing  Abdomen: soft, non tender to palpation, positive bowel sounds, no guarding, no rebound  Skin: no rashes  Musculoskeletal: no peripheral edema  Psychiatric: flat affect  Neurologic: CN 2-12 grossly intact, possible mild right facial droop, strength 4/5 throughout without focal deficits  Labs on Admission:  Basic Metabolic Panel:  Recent Labs Lab 02/01/14 2215  NA 134*  K 3.7  CL 95*  CO2 24  GLUCOSE 121*  BUN 51*  CREATININE 2.19*  CALCIUM 9.1   Liver Function Tests:  Recent Labs Lab 02/01/14 2215  AST 15  ALT 14  ALKPHOS 111    BILITOT 0.3  PROT 6.9  ALBUMIN 3.1*    Recent Labs Lab 02/01/14 2215  LIPASE 42   CBC:  Recent Labs Lab 02/01/14 2215  WBC 14.0*  NEUTROABS 11.1*  HGB 12.0  HCT 36.0  MCV 96.5  PLT 137*   Cardiac Enzymes:  Recent Labs Lab 02/01/14 2215  TROPONINI <0.30   BNP (last 3 results)  Recent Labs  02/01/14 2215  PROBNP 314.7*   Radiological Exams on Admission: Ct Abdomen Pelvis Wo Contrast  02/01/2014   CLINICAL DATA:  Flank pain.  Stage III renal disease.  EXAM: CT ABDOMEN AND PELVIS WITHOUT CONTRAST  TECHNIQUE: Multidetector CT imaging of the abdomen and pelvis was performed following the standard protocol without IV contrast.  COMPARISON:  Renal ultrasound performed 05/19/2010, and CT of the left hip performed 01/14/2012; CT of the abdomen and pelvis from 11/16/2009  FINDINGS: Minimal bibasilar atelectasis is noted.  The liver and spleen are unremarkable in appearance. The patient appears to be status post cholecystectomy. The pancreas and adrenal glands are unremarkable.  The patient is status post left-sided nephrectomy. There is mild left renal atrophy and scarring. Mild to moderate left-sided hydronephrosis is seen, grossly unchanged from the prior studies, without evidence of a distal obstructing stone. This may reflect the patient's urostomy.  No renal or ureteral stones are identified. Mild nonspecific left-sided perinephric stranding is noted.  No free fluid is identified. The remaining small bowel is grossly unremarkable. The stomach is within normal limits. No acute vascular abnormalities are seen. Scattered calcification is seen along the abdominal aorta and  its branches.  The patient's urostomy is difficult to fully assess, as it appears largely decompressed. No focal abnormalities are seen.  The patient is status post appendectomy. The colon is partially filled with stool and is unremarkable in appearance.  The bladder is decompressed and not well assessed. The patient  is status post hysterectomy. No suspicious adnexal masses are seen. No inguinal lymphadenopathy is seen.  No acute osseous abnormalities are identified. A left hip arthroplasty is partially imaged and appears grossly unremarkable.  IMPRESSION: 1. No acute abnormality seen to explain the patient's symptoms. 2. Persistent mild to moderate left-sided hydronephrosis, grossly unchanged from 2011, without evidence of a distal obstructing stone. Underlying urostomy is grossly unremarkable, but not well assessed. 3. Mild left renal atrophy and scarring noted. 4. Scattered calcification along the abdominal aorta and its branches.   Electronically Signed   By: Roanna Raider M.D.   On: 02/01/2014 23:24   Dg Chest 2 View  02/01/2014   CLINICAL DATA:  Flank pain.  Drooling.  Abnormal affect.  EXAM: CHEST  2 VIEW  COMPARISON:  Chest x-ray 09/15/2013.  FINDINGS: Lung volumes are low. No consolidative airspace disease. No pleural effusions. No pneumothorax. No pulmonary nodule or mass noted. Pulmonary vasculature and the cardiomediastinal silhouette are within normal limits.  IMPRESSION: 1. Low lung volumes without radiographic evidence of acute cardiopulmonary disease.   Electronically Signed   By: Trudie Reed M.D.   On: 02/01/2014 23:37   Ct Head Wo Contrast  02/01/2014   CLINICAL DATA:  Drooling.  Abnormal affect.  EXAM: CT HEAD WITHOUT CONTRAST  TECHNIQUE: Contiguous axial images were obtained from the base of the skull through the vertex without intravenous contrast.  COMPARISON:  None.  FINDINGS: Cavum septum pellucidum et vergae (normal anatomical variant) incidentally noted. Mild cerebral and cerebellar atrophy. Patchy and confluent areas of decreased attenuation are noted throughout the deep and periventricular white matter of the cerebral hemispheres bilaterally, compatible with chronic microvascular ischemic disease. No acute intracranial abnormalities. Specifically, no evidence of acute intracranial  hemorrhage, no definite findings of acute/subacute cerebral ischemia, no mass, mass effect, hydrocephalus or abnormal intra or extra-axial fluid collections. Visualized paranasal sinuses and mastoids are well pneumatized. No acute displaced skull fractures are identified.  IMPRESSION: 1. No acute intracranial abnormalities. 2. Mild cerebral and cerebellar atrophy with chronic microvascular ischemic changes in the cerebral white matter, similar to the prior examination.   Electronically Signed   By: Trudie Reed M.D.   On: 02/01/2014 23:20   EKG: Independently reviewed.  Assessment/Plan Active Problems:   Weakness   Depression   Hypertension   Chronic kidney disease   CHF (congestive heart failure)   Dementia   Renal failure (ARF), acute on chronic   UTI (lower urinary tract infection)   Abdominal pain, chronic, left lower quadrant   Acute encephalopathy   Dehydration   CKD (chronic kidney disease) stage 3, GFR 30-59 ml/min  #1 Acute encephalopathy with reported lethargy at home, likely multifactorial - urinalysis hard to interpret given ileal conduit, no history of MDRs but given mental status changes will treat with empiric ceftriaxone - given possible facial droop and weakness obtain brain MRI  #2 Acute on chronic renal failure - patient with LE swelling few days ago and used PRN Lasix, now mildly dehydrated, gentle IVF and repeat BMP in am - baseline Cr ~1.6  #3 Depression - wonder how much recent death in the family is affecting and contributing to #1 - consider psychiatry  consult if MRI negative and not responding well to antibiotics.  - continue home medications  #4 Chronic systolic heart failure - EF in the past ~25%, improved to 55-60% on most recent echo - no evidence of fluid overload  #5 HTN - continue home medications   Diet: NPO pending swallow eval Fluids: NS at 75 cc/h for 10 hours DVT Prophylaxis: heparin  Code Status: Full  Family Communication: d/w  daughters  Disposition Plan: inpatient  Time spent: 71  This note has been created with Education officer, environmental. Any transcriptional errors are unintentional.   Jeno Calleros M. Elvera Lennox, MD [facility] Pager [phone]  If 7PM-7AM, please contact night-coverage [email or web address] Password [email or web address] 02/02/2014, 2:00 AM

## 2014-02-02 NOTE — Progress Notes (Signed)
UR chart review completed.  

## 2014-02-02 NOTE — Progress Notes (Signed)
ANTIBIOTIC CONSULT NOTE - INITIAL  Pharmacy Consult for Rocephin Indication: UTI  Allergies  Allergen Reactions  . Demerol [Meperidine] Other (See Comments)    Reaction is unknown  . Ketorolac Tromethamine Rash   Patient Measurements: Height: 5' 5.5" (166.4 cm) Weight: 110 lb (49.896 kg) IBW/kg (Calculated) : 58.15  Vital Signs: Temp: 98.7 F (37.1 C) (08/11 0456) Temp src: Oral (08/11 0456) BP: 97/41 mmHg (08/11 0456) Pulse Rate: 56 (08/11 0456) Intake/Output from previous day: 08/10 0701 - 08/11 0700 In: -  Out: 250 [Urine:250] Intake/Output from this shift:    Labs:  Recent Labs  02/01/14 2215 02/02/14 0619  WBC 14.0*  --   HGB 12.0  --   PLT 137*  --   CREATININE 2.19* 2.09*   Estimated Creatinine Clearance: 18.9 ml/min (by C-G formula based on Cr of 2.09). No results found for this basename: VANCOTROUGH, VANCOPEAK, VANCORANDOM, GENTTROUGH, GENTPEAK, GENTRANDOM, TOBRATROUGH, TOBRAPEAK, TOBRARND, AMIKACINPEAK, AMIKACINTROU, AMIKACIN,  in the last 72 hours   Microbiology: No results found for this or any previous visit (from the past 720 hour(s)).  Medical History: Past Medical History  Diagnosis Date  . Gastroesophageal reflux   . Chronic UTI   . AV fistula     clotted in her left forearm  . Cellulitis   . Pneumothorax     secondary to central line placement  . Myocardial infarction 2011; 2012  . Numbness and tingling of both legs   . Anginal pain   . Heart murmur   . Dysrhythmia     "heart beats real fast"  . Chronic anemia   . Pneumonia     "one time"  . Chronic chest pain 01/14/12    1-2 X/day/H&P  . Arthritis     "at different places"  . Anxiety   . Depression   . Headache(784.0)     "alot; not daily"  . Frequent falls   . Dementia   . Collagen vascular disease   . S/P ileal conduit     post cystectomy/ileal conduit  . Anemia     [facility]  . Iron deficiency     [facility]  . Dysarthria 03/31/13    Speech is almost a little bit dysarthritic  . Unsteady     Stronger since she came out of nursing home & is walking with a cane  . Edema 03/31/13    Gained 9 pounds overnight & telephone nurse instructed her to take 20mg  of Lasix bid on 03/30/13. Still has swelling in legs.  . Metabolic acidosis     Chronic  . Hypertension     Good control  . CHF (congestive heart failure)     Systolic heart failure EF 25-30% by ECHO 2012  . Altered mental state     Recurrent episodes, with confusion variously related to narcotic use, infection, etc. and has required hospitilization, nursing home stays for rehab related to weakness, encephalopathy, etc.  . History of sepsis 2012    With E.Coli bacteremia  . E coli bacteremia 2012    History of sepsis & E.Coli bacteremia  . Secondary hyperparathyroidism     Dr. Caryn SectionFox had a sestamibi scan of her neck in May 2014 which was negative for parathyroid activity in her neck.  . Chronic kidney disease (CKD), stage III (moderate)     [name]/notes 09/15/2013  . Hydronephrosis     She has chronic mild left   .  Single kidney     post right nephrectomy  . Renal insufficiency    Rocephin 8/11 >>  Assessment: 72yo female admitted for lethargy and recurrent UTIs.  Asked to initiate Rocephin.  Estimated Creatinine Clearance: 18.9 ml/min (by C-G formula based on Cr of 2.09).  Goal of Therapy:  Eradicate infection.  Plan:   Rocephin 1gm IV q24hrs (no renal adjustment needed)  Monitor labs and f/u cultures  Valrie Hart A 02/02/2014,7:31 AM

## 2014-02-03 DIAGNOSIS — F329 Major depressive disorder, single episode, unspecified: Secondary | ICD-10-CM

## 2014-02-03 DIAGNOSIS — F3289 Other specified depressive episodes: Secondary | ICD-10-CM

## 2014-02-03 DIAGNOSIS — N189 Chronic kidney disease, unspecified: Secondary | ICD-10-CM

## 2014-02-03 DIAGNOSIS — E860 Dehydration: Secondary | ICD-10-CM

## 2014-02-03 DIAGNOSIS — G9340 Encephalopathy, unspecified: Secondary | ICD-10-CM

## 2014-02-03 LAB — URINE CULTURE

## 2014-02-03 LAB — CBC
HEMATOCRIT: 34.8 % — AB (ref 36.0–46.0)
HEMOGLOBIN: 11.5 g/dL — AB (ref 12.0–15.0)
MCH: 32 pg (ref 26.0–34.0)
MCHC: 33 g/dL (ref 30.0–36.0)
MCV: 96.9 fL (ref 78.0–100.0)
Platelets: 158 10*3/uL (ref 150–400)
RBC: 3.59 MIL/uL — ABNORMAL LOW (ref 3.87–5.11)
RDW: 15.4 % (ref 11.5–15.5)
WBC: 11.4 10*3/uL — AB (ref 4.0–10.5)

## 2014-02-03 LAB — BASIC METABOLIC PANEL
Anion gap: 11 (ref 5–15)
BUN: 45 mg/dL — AB (ref 6–23)
CALCIUM: 8.4 mg/dL (ref 8.4–10.5)
CHLORIDE: 105 meq/L (ref 96–112)
CO2: 26 mEq/L (ref 19–32)
CREATININE: 1.87 mg/dL — AB (ref 0.50–1.10)
GFR calc Af Amer: 30 mL/min — ABNORMAL LOW (ref >90)
GFR calc non Af Amer: 26 mL/min — ABNORMAL LOW (ref >90)
GLUCOSE: 99 mg/dL (ref 70–99)
Potassium: 3.7 mEq/L (ref 3.7–5.3)
Sodium: 142 mEq/L (ref 137–147)

## 2014-02-03 MED ORDER — BOOST / RESOURCE BREEZE PO LIQD
1.0000 | Freq: Three times a day (TID) | ORAL | Status: DC
  Administered 2014-02-03 – 2014-02-08 (×15): 1 via ORAL

## 2014-02-03 MED ORDER — SODIUM CHLORIDE 0.9 % IV SOLN
INTRAVENOUS | Status: DC
  Administered 2014-02-04: 15:00:00 via INTRAVENOUS

## 2014-02-03 NOTE — Progress Notes (Signed)
This NP spoke with nursing staff member Darvin Neighbourshristina Knight, RN regarding tele-psych. Per RN, pt and family all refused this evaluation as they do not wish to speak to Psychiatry secondary to spiritual belief systems. RN indicated that she has tried multiple times to reach TTS staff to convey this information but that she was unable to reach a person on the phone.    Beau FannyJohn C Rozell Theiler, FNP-BC     45:[id number]     02/03/2014

## 2014-02-03 NOTE — Progress Notes (Signed)
Speech Language Pathology Treatment:    Patient Details Name: [name] MRN: [id number] DOB: 07/09/1940 Today's Date: 02/03/2014 Time:  [id number]    Assessment / Plan / Recommendation Clinical Impression  Pt participated in a dysphagia skilled observation with solids/thin via straw and cup with one cough noted with one straw sip (however pt had just had breathing tx immediately before dysphagia tx), but otherwise, appeared without s/s of aspiration; daughter stated she had solids (pizza) last night for dinner without difficulty; nursing reports no concerns with swallowing; decreased intake noted d/t life events prior to hospitalization (i.e.: deaths in family); pt without c/o dysphagia as well; ST may want to f/u x1 to make certain pt is without any dysphagia prior to d/c from hospital   HPI See Evaluation; extensive medical hx   Pertinent Vitals Pain Assessment: No/denies pain  SLP Plan    Monitor safe intake of fluids/solids x1 prn   Recommendations  ST f/u x1 prn for dysphagia tx                        Raha Tennison,PAT, M.S., CCC-SLP 02/03/2014, 1:53 PM

## 2014-02-03 NOTE — Evaluation (Signed)
Occupational Therapy Evaluation Patient Details Name: [name] Galeas MRN: [id number] DOB: 09/28/1940 Today's Date: 02/03/2014    History of Present Illness [name] Pires is a 73 y.o. 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 Patient has a history of significant anxiety and depression, and last time she was hospitalized in March of 2015 with worsening of her psychiatric symptoms shortly after her husband passed away. Her daughters tell me that patient's sister died today from brain cancer. Over the last couple weeks, patient has been very withdrawn and with flat affect. Her daughter also tells me that today she seemed to be weaker and has noticed patient to be lethargic and with saliva drooping out of her mouth. She was also concerned that she has been weaker and she thinks patient's right eye is "different" as well as the corner of her mouth on right is "different" and was also concerned for a stroke. Patient is very flat in the ED, the only thing that she endorses is feeling "sad" and having worse back pain. Family associates her back pain with a UTI which she has had in the past, however never being this lethargic. In the ED, labs show acute on chronic renal failure and mild leukocytosis of 14.    Clinical Impression   Pt is presenting to acute OT with above situation.  She has generalized weakness in BUE, but was able to demonstrate good skills with ADL tasks with extra time required.  Pt min guard assist during functional transfers.  Daughter reports that 24-hour supervision can be provided if needed.  Recommend HHOT services at this time for continued UE strengthening and  involvement in ADL tasks.    Follow Up Recommendations  Home health OT    Equipment Recommendations  None recommended by OT    Recommendations for Other Services       Precautions / Restrictions Precautions Precautions: Fall Restrictions Weight Bearing Restrictions: No      Mobility Bed Mobility Overal bed mobility: Modified Independent                Transfers Overall transfer level: Needs assistance Equipment used: None Transfers: Sit to/from Stand Sit to Stand: Min guard              Balance Overall balance assessment: Needs assistance Sitting-balance support: No upper extremity supported;Feet supported Sitting balance-Leahy Scale: Good     Standing balance support: No upper extremity supported Standing balance-Leahy Scale: Good                              ADL Overall ADL's : Needs assistance/impaired Eating/Feeding: Supervision/ safety   Grooming: Supervision/safety               Lower Body Dressing: Min guard   Toilet Transfer: Min guard           Functional mobility during ADLs: Min guard       Vision                     Perception     Praxis      Pertinent Vitals/Pain Pain Assessment: No/denies pain  Hand Dominance Right   Extremity/Trunk Assessment Upper Extremity Assessment Upper Extremity Assessment: Generalized weakness   Lower Extremity Assessment Lower Extremity Assessment: Defer to PT evaluation       Communication Communication Communication: No difficulties   Cognition Arousal/Alertness: Awake/alert Behavior During Therapy: WFL for tasks assessed/performed Overall Cognitive Status: Within Functional Limits for tasks assessed                     General Comments       Exercises       Shoulder Instructions      Home Living Family/patient expects to be discharged to:: Private residence Living Arrangements: Alone (Daughter (retired) lived in [place]) Available  Help at Discharge: Family;Available 24 hours/day Type of Home: House Home Access: Stairs to enter [employer] of Steps: 2 Entrance Stairs-Rails: Can reach both Home Layout: Two level;Able to live on main level with bedroom/bathroom     Bathroom Shower/Tub: Tub/shower unit   Bathroom Toilet: Standard (Uses BSC over toilet)     Home Equipment: Cane - single point;Walker - 4 wheels;Shower seat;Bedside commode;Grab bars - tub/shower   Additional Comments: Husband passed away in 2022/09/02 and sister passed away this week.      Prior Functioning/Environment Level of Independence: Needs assistance  Gait / Transfers Assistance Needed: Pt reports not using any AD with ambulation ADL's / Homemaking Assistance Needed: Pt is modified indepenent with BADLs. Requires assist from daughter for medication managemetn and shopping/appointments        OT Diagnosis: Generalized weakness   OT Problem List: Decreased strength;Decreased activity tolerance   OT Treatment/Interventions: Self-care/ADL training;Therapeutic exercise;Energy conservation;Therapeutic activities;Patient/family education;Balance training    OT Goals(Current goals can be found in the care plan section) Acute Rehab OT Goals Patient Stated Goal: No OT goals stated OT Goal Formulation: With patient/family Time For Goal Achievement: 02/17/14 Potential to Achieve Goals: Good ADL Goals Pt Will Transfer to Toilet: with supervision Pt Will Perform Toileting - Clothing Manipulation and hygiene: with supervision Pt Will Perform Tub/Shower Transfer: with supervision Pt/caregiver will Perform Home Exercise Program: Increased strength;Both right and left upper extremity  OT Frequency: Min 2X/week   Barriers to D/C:            Co-evaluation              End of Session Equipment Utilized During Treatment: Gait belt  Activity Tolerance: Patient tolerated treatment well Patient left: in bed;with family/visitor  present;with call bell/phone within reach;with bed alarm set   Time: 1610-9604 OT Time Calculation (min): 29 min Charges:  OT General Charges $OT Visit: 1 Procedure OT Evaluation $Initial OT Evaluation Tier I: 1 Procedure G-Codes:     Marry Guan, MS, OTR/L [phone]  02/03/2014, 10:29 AM

## 2014-02-03 NOTE — Consult Note (Signed)
Pt refused tele-psych per RN (see my previous note). [facility] Due to pt's refusal to participate in psychiatric evaluation, [facility], for discharge planning, pt follow up with PCP to administer a PHQ-9 to accurately assess depression and consider the implementation of an antidepressant to treat patient's current documented depression symptoms. Please convey this during discharge and assist pt in coordinating appropriate referrals as necessary.   Beau FannyWithrow, Rashanna Christiana C, WashingtonFNP-BC     07:11PM      02/03/2014

## 2014-02-03 NOTE — Evaluation (Signed)
Physical Therapy Evaluation Patient Details Name: [name] MRN: [id number] DOB: [birth year] Today's Date: 02/03/2014   History of Present Illness  [name] is a 73 y.o. 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 Patient has a history of significant anxiety and depression, and last time she was hospitalized in March of 2015 with worsening of her psychiatric symptoms shortly after her husband passed away. Her daughters tell me that patient's sister died today from brain cancer. Over the last couple weeks, patient has been very withdrawn and with flat affect. Her daughter also tells me that today she seemed to be weaker and has noticed patient to be lethargic and with saliva drooping out of her mouth. She was also concerned that she has been weaker and she thinks patient's right eye is "different" as well as the corner of her mouth on right is "different" and was also concerned for a stroke. Patient is very flat in the ED, the only thing that she endorses is feeling "sad" and having worse back pain. Family associates her back pain with a UTI which she has had in the past, however never being this lethargic. In the ED, labs show acute on chronic renal failure and mild leukocytosis of 14.   Clinical Impression  Pt is a 73 year old female who presents to physical therapy with with dx of UTI.  During evaluation, pt had more of a flat affect and spoke very little.  Pt was able to complete bed mobility skills mod (I), transfers/gait with min guard/supervision.  No AD required for gait.  Noted good step length with gait, though limited foot clearance and no arm swing (pt reports no arm  swing to IV line).  Pt has decreased strength in (B) LE, and is quick to fatigue. Recommend continued PT while in the hospital to address strengthening, activity tolerance, and balance for improved functional mobility skills with transition to HHPT.  No DME recommendations at this time.     Follow Up Recommendations Home health PT    Equipment Recommendations  None recommended by PT       Precautions / Restrictions Precautions Precautions: Fall Restrictions Weight Bearing Restrictions: No      Mobility  Bed Mobility Overal bed mobility: Modified Independent                Transfers Overall transfer level: Needs assistance Equipment used: None Transfers: Sit to/from Stand Sit to Stand: Supervision;Min guard            Ambulation/Gait Ambulation/Gait assistance: Supervision;Min guard Ambulation Distance (Feet): 100 Feet Assistive device: None Gait Pattern/deviations: Decreased dorsiflexion - left;Decreased dorsiflexion - right;Step-through pattern   Gait velocity interpretation: Below normal speed for age/gender       Balance Overall balance assessment: No apparent balance deficits (not formally assessed) Sitting-balance support: No upper extremity supported;Feet supported Sitting balance-Leahy Scale: Good     Standing balance support: No upper extremity supported Standing balance-Leahy Scale: Good                               Pertinent Vitals/Pain Pain Assessment: No/denies pain    Home Living Family/patient expects to  be discharged to:: Private residence Living Arrangements: Alone Available Help at Discharge: Family;Available 24 hours/day Type of Home: House Home Access: Stairs to enter Entrance Stairs-Rails: Can reach both Entrance Stairs-Number of Steps: 2 Home Layout: Two level;Able to live on main level with bedroom/bathroom Home Equipment: Gilmer MorCane - single point;Walker - 4 wheels;Shower seat;Bedside commode;Grab bars -  tub/shower Additional Comments: Husband passed away in march and sister passed away this week.    Prior Function Level of Independence: Needs assistance   Gait / Transfers Assistance Needed: Pt reports not using any AD with ambulation  ADL's / Homemaking Assistance Needed: Pt is modified indepenent with BADLs. Requires assist from daughter for medication managemetn and shopping/appointments        Hand Dominance   Dominant Hand: Right    Extremity/Trunk Assessment   Upper Extremity Assessment: Defer to OT evaluation           Lower Extremity Assessment: Generalized weakness;RLE deficits/detail;LLE deficits/detail RLE Deficits / Details: MMT hip/knee 4-/5, ankle 4/5 LLE Deficits / Details: MMT hip/knee 4-/5, ankle 4/5     Communication   Communication: No difficulties  Cognition Arousal/Alertness: Awake/alert Behavior During Therapy: WFL for tasks assessed/performed Overall Cognitive Status: Within Functional Limits for tasks assessed                               Assessment/Plan    PT Assessment Patient needs continued PT services  PT Diagnosis Difficulty walking;Generalized weakness   PT Problem List Decreased strength;Decreased activity tolerance;Decreased mobility  PT Treatment Interventions Balance training;Gait training;Neuromuscular re-education;Stair training;Functional mobility training;Therapeutic activities;Therapeutic exercise;Patient/family education   PT Goals (Current goals can be found in the Care Plan section) Acute Rehab PT Goals Patient Stated Goal: go home PT Goal Formulation: With patient/family Time For Goal Achievement: 02/17/14 Potential to Achieve Goals: Good    Frequency Min 3X/week    End of Session Equipment Utilized During Treatment: Gait belt Activity Tolerance: Patient tolerated treatment well Patient left: in bed;with call bell/phone within reach;with bed alarm set;with family/visitor present           Time:  1030-1054 PT Time Calculation (min): 24 min   Charges:   PT Evaluation $Initial PT Evaluation Tier I: 1 Procedure          Landri Dorsainvil 02/03/2014, 10:58 AM

## 2014-02-03 NOTE — Progress Notes (Signed)
INITIAL NUTRITION ASSESSMENT  DOCUMENTATION CODES Per approved criteria  -Severe malnutrition in the context of acute illness or injury   Pt meets criteria for severe MALNUTRITION in the context of acute illness as evidenced by moderate fat and muscle depletion.  INTERVENTION: Resource Breeze po TID, each supplement provides 250 kcal and 9 grams of protein  NUTRITION DIAGNOSIS: Inadequate oral intake related to decreased appetite as evidenced by diet hx, moderate fat and muscle depletion.   Goal: Pt will meet >90% of estimated nutritional needs  Monitor:  PO/supplement intake, labs, weight changes, I/O's  Reason for Assessment: Low BMI  73 y.o. female  Admitting Dx: <principal problem not specified>  [name] is a 73 y.o. 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 Patient has a history of significant anxiety and depression, and last time she was hospitalized in March of 2015 with worsening of her psychiatric symptoms shortly after her husband passed away. Her daughters tell me that patient's sister died today from brain cancer. Over the last couple weeks, patient has been very withdrawn and with flat affect. Her daughter also tells me that today she seemed to be weaker and has noticed patient to be lethargic and with saliva drooping out of her mouth. She was also concerned that she has been weaker and she thinks patient's right eye is "different" as well as the corner of her mouth on right is "different" and was also concerned for a stroke. Patient is very flat in the ED, the only thing that she endorses is feeling "sad" and having worse back pain. Family associates  her back pain with a UTI which she has had in the past, however never being this lethargic. In the ED, labs show acute on chronic renal failure and mild leukocytosis of 14.   ASSESSMENT: Pt reports variable appetite PTA ("off and on"). She reports she did well with dinner last night and ate toast coffee, and chocolate milk for breakfast. SLP evaluated for swallow safety. Pt with good tolerance of PO diet. PO: 50-100%. She reports she drinks Boost at home when she is feeling weak, but prefers the [employer] Breeze supplement.   Diet recall PTA is as follows: Breakfast: instant bacon, 2 pieces of bread, coffee Lunc: sandwich with chips or fruit or frozen fruit popsicle Dinner: chicken alfredo or something her daughter prepares Beverages: coffee and Dr. Reino KentPepper  Documented wt hx reveals progressive wt loss over the past 9 months, including a 12.6% wt loss x 6 months. Noted pt lost her husband approximately one year ago. However, this is inconsistent with pt's reported hx, as she reveals UBW of 110# over the past year. Pt was very tearful at time of visit and shared with this RD that her sister recently passed away and her visitation is this evening.   Educated pt on importance of good PO intake promote healing process. Also encouraged use of supplements during hospitalization and at discharge to help rehabilitate nutritional status. Pt agreeable to have Resource Breeze ordered during hospitalization.  Labs reviewed. BUN/Creat: 45/1.87.   Nutrition Focused Physical Exam:  Subcutaneous Fat:  Orbital Region: moderate depletion Upper Arm Region: moderate depletion Thoracic and Lumbar Region: moderate depletion  Muscle:  Temple Region:  moderate depletion Clavicle Bone Region: moderate depletion Clavicle and Acromion Bone Region: moderate depletion Scapular Bone Region: moderate depletion Dorsal Hand: moderate depletion Patellar Region: moderate depletion Anterior Thigh Region: moderate  depletion Posterior Calf Region: moderate depletion  Edema: none present  Height: Ht Readings from Last 1 Encounters:  02/01/14 5' 5.5" (1.664 m)    Weight: Wt Readings from Last 1 Encounters:  02/01/14 110 lb (49.896 kg)    Ideal Body Weight: 128#  % Ideal Body Weight: 86%  Wt Readings from Last 10 Encounters:  02/01/14 110 lb (49.896 kg)  10/19/13 114 lb 8 oz (51.937 kg)  09/15/13 112 lb 10.5 oz (51.1 kg)  08/04/13 126 lb 8 oz (57.38 kg)  08/03/13 124 lb (56.246 kg)  08/01/13 120 lb (54.432 kg)  07/27/13 120 lb 11.2 oz (54.749 kg)  06/16/13 130 lb (58.968 kg)  06/12/13 134 lb (60.782 kg)  01/19/13 128 lb 1.4 oz (58.1 kg)    Usual Body Weight: 128#  % Usual Body Weight: 86%  BMI:  Body mass index is 18.02 kg/(m^2). Underweight  Estimated Nutritional Needs: Kcal: 1500-1700 Protein: 62-72 grams Fluid: 1.5-1.7 L  Skin: RLQ ileostomy, skin graft to bilateral thighs  Diet Order: Cardiac  EDUCATION NEEDS: -Education needs addressed   Intake/Output Summary (Last 24 hours) at 02/03/14 0852 Last data filed at 02/03/14 0500  Gross per 24 hour  Intake   1580 ml  Output   2650 ml  Net  -1070 ml    Last BM: 02/01/14  Labs:   Recent Labs Lab 02/01/14 2215 02/02/14 0619 02/03/14 0551  NA 134* 139 142  K 3.7 3.5* 3.7  CL 95* 100 105  CO2 24 26 26   BUN 51* 49* 45*  CREATININE 2.19* 2.09* 1.87*  CALCIUM 9.1 8.4 8.4  GLUCOSE 121* 100* 99    CBG (last 3)  No results found for this basename: GLUCAP,  in the last 72 hours  Scheduled Meds: . ALPRAZolam  0.5 mg Oral TID  . antiseptic oral rinse  7 mL Mouth Rinse BID  . aspirin EC  81 mg Oral Daily  . calcitRIOL  0.5 mcg Oral Daily  . calcium-vitamin D  1 tablet Oral Q breakfast  . carvedilol  25 mg Oral BID WC  . cefTRIAXone (ROCEPHIN)  IV  1 g Intravenous Q24H  . citalopram  20 mg Oral Daily  . donepezil  10 mg Oral QPM  . heparin  5,000 Units Subcutaneous 3 times per day  . ipratropium-albuterol   3 mL Nebulization QID  . mirtazapine  15 mg Oral QHS  . mometasone-formoterol  2 puff Inhalation BID  . sodium bicarbonate  650 mg Oral TID    Continuous Infusions:   Past Medical History  Diagnosis Date  . Gastroesophageal reflux   . Chronic UTI   . AV fistula     clotted in her left forearm  . Cellulitis   . Pneumothorax     secondary to central line placement  . Myocardial infarction 2011; 2012  . Numbness and tingling of both legs   . Anginal pain   . Heart murmur   . Dysrhythmia     "heart beats real fast"  . Chronic anemia   . Pneumonia     "one time"  . Chronic chest pain 01/14/12    1-2 X/day/H&P  . Arthritis     "at different places"  . Anxiety   . Depression   . Headache(784.0)     "  alot; not daily"  . Frequent falls   . Dementia   . Collagen vascular disease   . S/P ileal conduit     post cystectomy/ileal conduit  . Anemia     Received IV Feraheme (x) 2 at [facility]  . Iron deficiency     Received IV Feraheme (x) 2 at [facility].  . Dysarthria 03/31/13    Speech is almost a little bit dysarthritic  . Unsteady     Stronger since she came out of nursing home & is walking with a cane  . Edema 03/31/13    Gained 9 pounds overnight & telephone nurse instructed her to take 20mg  of Lasix bid on 03/30/13. Still has swelling in legs.  . Metabolic acidosis     Chronic  . Hypertension     Good control  . CHF (congestive heart failure)     Systolic heart failure EF 25-30% by ECHO 2012  . Altered mental state     Recurrent episodes, with confusion variously related to narcotic use, infection, etc. and has required hospitilization, nursing home stays for rehab related to weakness, encephalopathy, etc.  . History of sepsis 2012    With E.Coli bacteremia  . E coli bacteremia 2012    History of sepsis & E.Coli bacteremia  . Secondary hyperparathyroidism     Dr. Caryn Section had a sestamibi scan of her neck in May 2014 which was negative for parathyroid activity in her  neck.  . Chronic kidney disease (CKD), stage III (moderate)     [name] 09/15/2013  . Hydronephrosis     She has chronic mild left   . Single kidney     post right nephrectomy  . Renal insufficiency     Past Surgical History  Procedure Laterality Date  . Hip arthroplasty      left; S/P fracture  . Rod left leg  2012    left hip "to almost my knee; for my hip"  . Creation / revision of ileostomy / jejunostomy  1973  . Nephrectomy  1973    right  . Fracture surgery    . Partial colectomy  1970  . Tonsillectomy and adenoidectomy      "when I was a kid"  . Cholecystectomy  2000  . Appendectomy  2000  . Abdominal hysterectomy    . Dilation and curettage of uterus    . Cesarean section  1969    only 4th of 4 children was c-section  . Av fistula placement  1970's    LFA  . Bladder removal  1974    with ileal conduit and urostomy    Kennady Zimmerle A. Mayford Knife, RD, LDN Pager: [phone]

## 2014-02-03 NOTE — Progress Notes (Signed)
[facility] PROGRESS NOTE  [name] VHQ:[id number] DOB: 05/25/1941 DOA: 02/01/2014 PCP: Toma Deiters, MD  Assessment/Plan: 1. Acute encephalopathy -Likely multifactorial with possible urinary tract infection, acute on chronic renal failure contributors, although I think that major depression may be a significant component of this. She is awake, alert and oriented x3, however care is a flat affect. She has several losses, with her husband dying of cancer several months ago and her sister passing away 2 days ago. Family members reporting that she had a significant decline after the recent death of her sister. Will consult Behavioral Health.   2. Acute on chronic kidney injury. -Given patient's likely underlying depression it is possible that she has had minimal by mouth intake and kidney failure related to prerenal azotemia -Continue gentle IV fluid hydration with normal saline running at 75 mL/hour  3. Possible urinary tract infection -Lab showing an elevated white count of 14,000, patient have a low-grade fever of 100.4. -Has history of ileal conduit, make a urine analysis difficult to interpret -Given elevated white blood count an overall functional decline will treat empirically with IV antibiotic therapy  4. History of chronic systolic congestive heart failure -She currently appears compensated. Her last transthoracic echocardiogram was performed on 10/31/2011 which showed an ejection fraction 55-60% -She is receiving IV fluids will monitor closely for overload  Code Status: Full Code Family Communication: Spoke with her daughter present at bedside Disposition Plan: Anticipate discharge to home   Consultants:   Antibiotics:  Rocephin 1 g IV q 24 hours  HPI/Subjective: Patient remains minimally verbal on my evaluation,although was able to identify this place as anything hospital in the [facility] with the year being 2015. She ambulated down the  hallway.  Objective: Filed Vitals:   02/03/14 0509  BP: 109/60  Pulse: 56  Temp: 98.1 F (36.7 C)  Resp: 20    Intake/Output Summary (Last 24 hours) at 02/03/14 1214 Last data filed at 02/03/14 1052  Gross per 24 hour  Intake   1340 ml  Output   2950 ml  Net  -1610 ml   Filed Weights   02/01/14 2014  Weight: 49.896 kg (110 lb)    Exam:   General:  Flat affect, she is awake and alert oriented x3  Cardiovascular: Regular rate and rhythm normal S1-S2  Respiratory: Clear to auscultation bilaterally, normal respiratory effort  Abdomen: Soft nontender nondistended  Musculoskeletal: No edema  Data Reviewed: Basic Metabolic Panel:  Recent Labs Lab 02/01/14 2215 02/02/14 0619 02/03/14 0551  NA 134* 139 142  K 3.7 3.5* 3.7  CL 95* 100 105  CO2 24 26 26   GLUCOSE 121* 100* 99  BUN 51* 49* 45*  CREATININE 2.19* 2.09* 1.87*  CALCIUM 9.1 8.4 8.4   Liver Function Tests:  Recent Labs Lab 02/01/14 2215  AST 15  ALT 14  ALKPHOS 111  BILITOT 0.3  PROT 6.9  ALBUMIN 3.1*    Recent Labs Lab 02/01/14 2215  LIPASE 42   No results found for this basename: AMMONIA,  in the last 168 hours CBC:  Recent Labs Lab 02/01/14 2215 02/03/14 0551  WBC 14.0* 11.4*  NEUTROABS 11.1*  --   HGB 12.0 11.5*  HCT 36.0 34.8*  MCV 96.5 96.9  PLT 137* 158   Cardiac Enzymes:  Recent Labs Lab 02/01/14 2215  TROPONINI <0.30   BNP (last 3 results)  Recent Labs  02/01/14 2215  PROBNP 314.7*   CBG: No results found for this  basename: GLUCAP,  in the last 168 hours  No results found for this or any previous visit (from the past 240 hour(s)).   Studies: Ct Abdomen Pelvis Wo Contrast  02/01/2014   CLINICAL DATA:  Flank pain.  Stage III renal disease.  EXAM: CT ABDOMEN AND PELVIS WITHOUT CONTRAST  TECHNIQUE: Multidetector CT imaging of the abdomen and pelvis was performed following the standard protocol without IV contrast.  COMPARISON:  Renal ultrasound performed  05/19/2010, and CT of the left hip performed 01/14/2012; CT of the abdomen and pelvis from 11/16/2009  FINDINGS: Minimal bibasilar atelectasis is noted.  The liver and spleen are unremarkable in appearance. The patient appears to be status post cholecystectomy. The pancreas and adrenal glands are unremarkable.  The patient is status post left-sided nephrectomy. There is mild left renal atrophy and scarring. Mild to moderate left-sided hydronephrosis is seen, grossly unchanged from the prior studies, without evidence of a distal obstructing stone. This may reflect the patient's urostomy.  No renal or ureteral stones are identified. Mild nonspecific left-sided perinephric stranding is noted.  No free fluid is identified. The remaining small bowel is grossly unremarkable. The stomach is within normal limits. No acute vascular abnormalities are seen. Scattered calcification is seen along the abdominal aorta and its branches.  The patient's urostomy is difficult to fully assess, as it appears largely decompressed. No focal abnormalities are seen.  The patient is status post appendectomy. The colon is partially filled with stool and is unremarkable in appearance.  The bladder is decompressed and not well assessed. The patient is status post hysterectomy. No suspicious adnexal masses are seen. No inguinal lymphadenopathy is seen.  No acute osseous abnormalities are identified. A left hip arthroplasty is partially imaged and appears grossly unremarkable.  IMPRESSION: 1. No acute abnormality seen to explain the patient's symptoms. 2. Persistent mild to moderate left-sided hydronephrosis, grossly unchanged from 2011, without evidence of a distal obstructing stone. Underlying urostomy is grossly unremarkable, but not well assessed. 3. Mild left renal atrophy and scarring noted. 4. Scattered calcification along the abdominal aorta and its branches.   Electronically Signed   By: Roanna Raider M.D.   On: 02/01/2014 23:24   Dg  Chest 2 View  02/01/2014   CLINICAL DATA:  Flank pain.  Drooling.  Abnormal affect.  EXAM: CHEST  2 VIEW  COMPARISON:  Chest x-ray 09/15/2013.  FINDINGS: Lung volumes are low. No consolidative airspace disease. No pleural effusions. No pneumothorax. No pulmonary nodule or mass noted. Pulmonary vasculature and the cardiomediastinal silhouette are within normal limits.  IMPRESSION: 1. Low lung volumes without radiographic evidence of acute cardiopulmonary disease.   Electronically Signed   By: Trudie Reed M.D.   On: 02/01/2014 23:37   Ct Head Wo Contrast  02/01/2014   CLINICAL DATA:  Drooling.  Abnormal affect.  EXAM: CT HEAD WITHOUT CONTRAST  TECHNIQUE: Contiguous axial images were obtained from the base of the skull through the vertex without intravenous contrast.  COMPARISON:  None.  FINDINGS: Cavum septum pellucidum et vergae (normal anatomical variant) incidentally noted. Mild cerebral and cerebellar atrophy. Patchy and confluent areas of decreased attenuation are noted throughout the deep and periventricular white matter of the cerebral hemispheres bilaterally, compatible with chronic microvascular ischemic disease. No acute intracranial abnormalities. Specifically, no evidence of acute intracranial hemorrhage, no definite findings of acute/subacute cerebral ischemia, no mass, mass effect, hydrocephalus or abnormal intra or extra-axial fluid collections. Visualized paranasal sinuses and mastoids are well pneumatized. No acute displaced skull  fractures are identified.  IMPRESSION: 1. No acute intracranial abnormalities. 2. Mild cerebral and cerebellar atrophy with chronic microvascular ischemic changes in the cerebral white matter, similar to the prior examination.   Electronically Signed   By: Trudie Reedaniel  Entrikin M.D.   On: 02/01/2014 23:20   Mr Brain Wo Contrast  02/02/2014   CLINICAL DATA:  Increasing confusion.  Stroke.  EXAM: MRI HEAD WITHOUT CONTRAST  TECHNIQUE: Multiplanar, multiecho pulse  sequences of the brain and surrounding structures were obtained without intravenous contrast.  COMPARISON:  Head CT 02/01/2014  FINDINGS: Images are mildly degraded by motion.  There is no evidence of acute infarct, intracranial hemorrhage, mass, midline shift, or extra-axial fluid collection. Incidental note is made of a cavum septum pellucidum et vergae. Patchy and confluent T2 hyperintensities in the subcortical and deep cerebral white matter and brainstem are nonspecific but compatible with moderate chronic small vessel ischemic disease. There is mild generalized cerebral atrophy.  Orbits are unremarkable. Paranasal sinuses and mastoid air cells are clear. Major intracranial vascular flow voids are preserved.  IMPRESSION: 1. No acute intracranial abnormality. 2. Moderate chronic small vessel ischemic disease.   Electronically Signed   By: Sebastian AcheAllen  Grady   On: 02/02/2014 19:36    Scheduled Meds: . ALPRAZolam  0.5 mg Oral TID  . antiseptic oral rinse  7 mL Mouth Rinse BID  . aspirin EC  81 mg Oral Daily  . calcitRIOL  0.5 mcg Oral Daily  . calcium-vitamin D  1 tablet Oral Q breakfast  . carvedilol  25 mg Oral BID WC  . cefTRIAXone (ROCEPHIN)  IV  1 g Intravenous Q24H  . citalopram  20 mg Oral Daily  . donepezil  10 mg Oral QPM  . feeding supplement (RESOURCE BREEZE)  1 Container Oral TID BM  . heparin  5,000 Units Subcutaneous 3 times per day  . ipratropium-albuterol  3 mL Nebulization QID  . mirtazapine  15 mg Oral QHS  . mometasone-formoterol  2 puff Inhalation BID  . sodium bicarbonate  650 mg Oral TID   Continuous Infusions:   Active Problems:   Weakness   Depression   Hypertension   Chronic kidney disease   CHF (congestive heart failure)   Dementia   Renal failure (ARF), acute on chronic   UTI (lower urinary tract infection)   Abdominal pain, chronic, left lower quadrant   Acute encephalopathy   Dehydration   CKD (chronic kidney disease) stage 3, GFR 30-59 ml/min    Time  spent: 35 min    Jeralyn BennettZAMORA, Merrit Friesen  [facility] Pager [phone]. If 7PM-7AM, please contact night-coverage at [email or web address], password [email or web address] 02/03/2014, 12:14 PM  LOS: 2 days

## 2014-02-03 NOTE — Progress Notes (Signed)
Patient refuses telepysch consult. Dr. Vanessa BarbaraZamora notified

## 2014-02-04 ENCOUNTER — Inpatient Hospital Stay (HOSPITAL_COMMUNITY): Payer: Medicare Other

## 2014-02-04 DIAGNOSIS — R259 Unspecified abnormal involuntary movements: Secondary | ICD-10-CM

## 2014-02-04 LAB — BASIC METABOLIC PANEL
Anion gap: 11 (ref 5–15)
BUN: 41 mg/dL — ABNORMAL HIGH (ref 6–23)
CALCIUM: 8.7 mg/dL (ref 8.4–10.5)
CHLORIDE: 107 meq/L (ref 96–112)
CO2: 25 mEq/L (ref 19–32)
Creatinine, Ser: 1.68 mg/dL — ABNORMAL HIGH (ref 0.50–1.10)
GFR calc Af Amer: 34 mL/min — ABNORMAL LOW (ref >90)
GFR calc non Af Amer: 29 mL/min — ABNORMAL LOW (ref >90)
Glucose, Bld: 90 mg/dL (ref 70–99)
Potassium: 4.1 mEq/L (ref 3.7–5.3)
SODIUM: 143 meq/L (ref 137–147)

## 2014-02-04 LAB — CBC
HCT: 34.8 % — ABNORMAL LOW (ref 36.0–46.0)
Hemoglobin: 11.4 g/dL — ABNORMAL LOW (ref 12.0–15.0)
MCH: 31.9 pg (ref 26.0–34.0)
MCHC: 32.8 g/dL (ref 30.0–36.0)
MCV: 97.5 fL (ref 78.0–100.0)
Platelets: 181 10*3/uL (ref 150–400)
RBC: 3.57 MIL/uL — ABNORMAL LOW (ref 3.87–5.11)
RDW: 15.2 % (ref 11.5–15.5)
WBC: 7.3 10*3/uL (ref 4.0–10.5)

## 2014-02-04 MED ORDER — CARBIDOPA-LEVODOPA 25-100 MG PO TABS
1.0000 | ORAL_TABLET | Freq: Three times a day (TID) | ORAL | Status: DC
Start: 2014-02-11 — End: 2014-02-08

## 2014-02-04 MED ORDER — CARBIDOPA-LEVODOPA 25-100 MG PO TABS
1.0000 | ORAL_TABLET | Freq: Two times a day (BID) | ORAL | Status: DC
  Administered 2014-02-04 – 2014-02-08 (×8): 1 via ORAL
  Filled 2014-02-04 (×8): qty 1

## 2014-02-04 MED ORDER — LORAZEPAM 0.5 MG PO TABS: 0.5000 mg | ORAL_TABLET | Freq: Once | ORAL | Status: DC

## 2014-02-04 NOTE — Consult Note (Signed)
[place] A. Merlene Laughter, MD     [email or web address]          [name] is an 73 y.o. female.   ASSESSMENT/PLAN: 1. Subacute onset of parkinsonism. The most likely This is idiopathic Parkinson disease although there are some atypical features. The patient has had episodes of confusion and encephalopathy although they're in the setting of toxic metabolic derangements including UTI and the opioids. There is no clear evidence of dementia at this point in time whether there may be some mild cognitive impairment. There is no evidence of medication-induced parkinsonism at this time. The patient will be started on Sinemet and titrated to at least 25/100 3 times a day. She already has been worked up for cognitive impairment including dementia labs done March of this year. These were unremarkable. An EEG will be obtained. We suggest physical and occupational therapy.   The patient is a 73 year old white female who has had a progressive downturn in function over the last several weeks. Symptoms are gotten particularly worse over the last 3 weeks. The history is obtained from the patient and also the daughter who is in from out of town. The daughter reports however that she has been in contact with the other daughter who lives in [place]. They report that the patient has been withdrawn, she had had difficulties walking, symmetric tremors, tremors of the jaw, dysarthria and global weakness. She also been having spells of staring and unresponsiveness. No diplopia or dysphagia as reported. No focal weakness just global weakness.The symptoms have gotten progressively worse. She has been admitted to the hospital and the workup was significant for questionable UTI. The culture however shows multiple organisms suspicious for contaminants. She has had a increase in her creatinine from baseline of 1.42 to 2.3. Imaging and other workup has been unremarkable. She does have some chronic issues with chest  pain apparently due to stable angina. There is also some shortness of breath. Review of systems otherwise negative.  GENERAL:  This is a pleasant female who has mild global generalized psychomotor slowing.  HEENT: Supple. Atraumatic normocephalic. Near continuous tremor of the jaw high-frequency low amplitude. There is evidence of facial bradykinesia with a mask face.  ABDOMEN: soft  EXTREMITIES: No edema   BACK: Normal.  SKIN: Normal by inspection.    MENTAL STATUS: Alert and oriented. Speech, language and cognition are generally intact. Judgment and insight normal.   CRANIAL NERVES: Pupils are equal, round and reactive to light and accommodation; extra ocular movements are full, there is no significant nystagmus; visual fields are full; upper and lower facial muscles are normal in strength and symmetric, there is no flattening of the nasolabial folds; tongue is midline; uvula is midline; shoulder elevation is normal.  MOTOR: Normal tone, bulk and strength; no pronator drift.  COORDINATION: There is global bradykinesia. This is appreciated both axillae and appendicular. She has a rather severe bradykinesia with significantly impaired finger tapping and fine finger movements. There is no cogwheeling even with augmentation. There is frequent symmetric high frequency low amplitude pin rolling Rest tremor involving the hands bilaterally. Some tremors noted in the postural position but the most prominent tremors in the rest position. No dysmetria is observed.  REFLEXES: Deep tendon reflexes are symmetrical and normal. Babinski reflexes are flexor bilaterally.   SENSATION: Normal to light touch.      Past Medical History  Diagnosis Date  . Gastroesophageal reflux   . Chronic UTI   . AV fistula  clotted in her left forearm  . Cellulitis   . Pneumothorax     secondary to central line placement  . Myocardial infarction 2011; 2012  . Numbness and tingling of both legs   . Anginal  pain   . Heart murmur   . Dysrhythmia     "heart beats real fast"  . Chronic anemia   . Pneumonia     "one time"  . Chronic chest pain 01/14/12    1-2 X/day/H&P  . Arthritis     "at different places"  . Anxiety   . Depression   . Headache(784.0)     "alot; not daily"  . Frequent falls   . Dementia   . Collagen vascular disease   . S/P ileal conduit     post cystectomy/ileal conduit  . Anemia     Received IV Feraheme (x) 2 at [facility]  . Iron deficiency     Received IV Feraheme (x) 2 at [facility].  . Dysarthria 03/31/13    Speech is almost a little bit dysarthritic  . Unsteady     Stronger since she came out of nursing home & is walking with a cane  . Edema 03/31/13    Gained 9 pounds overnight & telephone nurse instructed her to take 66m of Lasix bid on 03/30/13. Still has swelling in legs.  . Metabolic acidosis     Chronic  . Hypertension     Good control  . CHF (congestive heart failure)     Systolic heart failure EF 25-30% by ECHO 2012  . Altered mental state     Recurrent episodes, with confusion variously related to narcotic use, infection, etc. and has required hospitilization, nursing home stays for rehab related to weakness, encephalopathy, etc.  . History of sepsis 2012    With E.Coli bacteremia  . E coli bacteremia 2012    History of sepsis & E.Coli bacteremia  . Secondary hyperparathyroidism     Dr. FHassell Donehad a sestamibi scan of her neck in May 2014 which was negative for parathyroid activity in her neck.  . Chronic kidney disease (CKD), stage III (moderate)     /Archie Endo3/24/2015  . Hydronephrosis     She has chronic mild left   . Single kidney     post right nephrectomy  . Renal insufficiency     Past Surgical History  Procedure Laterality Date  . Hip arthroplasty      left; S/P fracture  . Rod left leg  2012    left hip "to almost my knee; for my hip"  . Creation / revision of ileostomy / jejunostomy  1973  . Nephrectomy  1973    right  .  Fracture surgery    . Partial colectomy  1970  . Tonsillectomy and adenoidectomy      "when I was a kid"  . Cholecystectomy  2000  . Appendectomy  2000  . Abdominal hysterectomy    . Dilation and curettage of uterus    . Cesarean section  1969    only 4th of 4 children was c-section  . Av fistula placement  1970's    LFA  . Bladder removal  1974    with ileal conduit and urostomy    Family History  Problem Relation Age of Onset  . Heart attack Mother   . Healthy Daughter   . Healthy Daughter   . Healthy Son     Social History:  reports that she quit  smoking about 9 years ago. Her smoking use included Cigarettes. She smoked 0.00 packs per day for 2 years. She has never used smokeless tobacco. She reports that she does not drink alcohol or use illicit drugs.  Allergies:  Allergies  Allergen Reactions  . Demerol [Meperidine] Other (See Comments)    Reaction is unknown  . Ketorolac Tromethamine Rash    Medications: Prior to Admission medications   Medication Sig Start Date End Date Taking? Authorizing Provider  ALPRAZolam Duanne Moron) 0.5 MG tablet Take 0.5 mg by mouth 3 (three) times daily.   Yes Historical Provider, MD  aspirin EC 81 MG tablet Take 81 mg by mouth daily.   Yes Historical Provider, MD  calcitRIOL (ROCALTROL) 0.5 MCG capsule Take 1 capsule (0.5 mcg total) by mouth daily. 09/18/13  Yes Shanker Kristeen Mans, MD  calcium citrate-vitamin D 200-200 MG-UNIT TABS Take 1 tablet by mouth daily.     Yes Historical Provider, MD  carvedilol (COREG) 25 MG tablet Take 25 mg by mouth 2 (two) times daily.   Yes Historical Provider, MD  citalopram (CELEXA) 20 MG tablet Take 20 mg by mouth daily.   Yes Historical Provider, MD  donepezil (ARICEPT) 10 MG tablet Take 10 mg by mouth every evening.   Yes Historical Provider, MD  Fluticasone Furoate-Vilanterol (BREO ELLIPTA) 100-25 MCG/INH AEPB Inhale 1 puff into the lungs daily.   Yes Historical Provider, MD  furosemide (LASIX) 40 MG tablet  Take 40 mg by mouth every Saturday. Takes only for swelling   Yes Historical Provider, MD  Ipratropium-Albuterol (COMBIVENT RESPIMAT) 20-100 MCG/ACT AERS respimat Inhale 1 puff into the lungs 4 (four) times daily.   Yes Historical Provider, MD  mirtazapine (REMERON) 15 MG tablet Take 1 tablet (15 mg total) by mouth at bedtime. 09/18/13  Yes Shanker Kristeen Mans, MD  Multiple Vitamin (MULTIVITAMIN) tablet Take 1 tablet by mouth daily.     Yes Historical Provider, MD  nitroGLYCERIN (NITROSTAT) 0.4 MG SL tablet Place 1 tablet (0.4 mg total) under the tongue every 5 (five) minutes as needed. For chest pain 06/16/12  Yes Thayer Headings, MD  Ranitidine HCl (ACID REDUCER PO) Take 1 tablet by mouth daily as needed (for acid reflux/gerd).    Yes Historical Provider, MD  sodium bicarbonate 650 MG tablet Take 650 mg by mouth 3 (three) times daily.    Yes Historical Provider, MD    Scheduled Meds: . ALPRAZolam  0.5 mg Oral TID  . antiseptic oral rinse  7 mL Mouth Rinse BID  . aspirin EC  81 mg Oral Daily  . calcitRIOL  0.5 mcg Oral Daily  . calcium-vitamin D  1 tablet Oral Q breakfast  . carvedilol  25 mg Oral BID WC  . cefTRIAXone (ROCEPHIN)  IV  1 g Intravenous Q24H  . citalopram  20 mg Oral Daily  . donepezil  10 mg Oral QPM  . feeding supplement (RESOURCE BREEZE)  1 Container Oral TID BM  . heparin  5,000 Units Subcutaneous 3 times per day  . ipratropium-albuterol  3 mL Nebulization QID  . mirtazapine  15 mg Oral QHS  . mometasone-formoterol  2 puff Inhalation BID  . sodium bicarbonate  650 mg Oral TID   Continuous Infusions: . sodium chloride 75 mL/hr at 02/04/14 1520   PRN Meds:.acetaminophen, senna-docusate   Blood pressure 125/53, pulse 65, temperature 98.8 F (37.1 C), temperature source Oral, resp. rate 18, height 5' 5.5" (1.664 m), weight 54.658 kg (120 lb 8 oz), SpO2  97.00%.   Results for orders placed during the hospital encounter of 02/01/14 (from the past 48 hour(s))  CBC      Status: Abnormal   Collection Time    02/03/14  5:51 AM      Result Value Ref Range   WBC 11.4 (*) 4.0 - 10.5 K/uL   RBC 3.59 (*) 3.87 - 5.11 MIL/uL   Hemoglobin 11.5 (*) 12.0 - 15.0 g/dL   HCT 34.8 (*) 36.0 - 46.0 %   MCV 96.9  78.0 - 100.0 fL   MCH 32.0  26.0 - 34.0 pg   MCHC 33.0  30.0 - 36.0 g/dL   RDW 15.4  11.5 - 15.5 %   Platelets 158  150 - 400 K/uL  BASIC METABOLIC PANEL     Status: Abnormal   Collection Time    02/03/14  5:51 AM      Result Value Ref Range   Sodium 142  137 - 147 mEq/L   Potassium 3.7  3.7 - 5.3 mEq/L   Chloride 105  96 - 112 mEq/L   CO2 26  19 - 32 mEq/L   Glucose, Bld 99  70 - 99 mg/dL   BUN 45 (*) 6 - 23 mg/dL   Creatinine, Ser 1.87 (*) 0.50 - 1.10 mg/dL   Calcium 8.4  8.4 - 10.5 mg/dL   GFR calc non Af Amer 26 (*) >90 mL/min   GFR calc Af Amer 30 (*) >90 mL/min   Comment: (NOTE)     The eGFR has been calculated using the CKD EPI equation.     This calculation has not been validated in all clinical situations.     eGFR's persistently <90 mL/min signify possible Chronic Kidney     Disease.   Anion gap 11  5 - 15  CBC     Status: Abnormal   Collection Time    02/04/14  6:38 AM      Result Value Ref Range   WBC 7.3  4.0 - 10.5 K/uL   RBC 3.57 (*) 3.87 - 5.11 MIL/uL   Hemoglobin 11.4 (*) 12.0 - 15.0 g/dL   HCT 34.8 (*) 36.0 - 46.0 %   MCV 97.5  78.0 - 100.0 fL   MCH 31.9  26.0 - 34.0 pg   MCHC 32.8  30.0 - 36.0 g/dL   RDW 15.2  11.5 - 15.5 %   Platelets 181  150 - 400 K/uL  BASIC METABOLIC PANEL     Status: Abnormal   Collection Time    02/04/14  6:38 AM      Result Value Ref Range   Sodium 143  137 - 147 mEq/L   Potassium 4.1  3.7 - 5.3 mEq/L   Chloride 107  96 - 112 mEq/L   CO2 25  19 - 32 mEq/L   Glucose, Bld 90  70 - 99 mg/dL   BUN 41 (*) 6 - 23 mg/dL   Creatinine, Ser 1.68 (*) 0.50 - 1.10 mg/dL   Calcium 8.7  8.4 - 10.5 mg/dL   GFR calc non Af Amer 29 (*) >90 mL/min   GFR calc Af Amer 34 (*) >90 mL/min   Comment: (NOTE)      The eGFR has been calculated using the CKD EPI equation.     This calculation has not been validated in all clinical situations.     eGFR's persistently <90 mL/min signify possible Chronic Kidney     Disease.   Anion gap 11  5 - 15    Dg Shoulder Right  02/04/2014   CLINICAL DATA:  Right shoulder pain no injury.  EXAM: RIGHT SHOULDER - 2+ VIEW  COMPARISON:  Right shoulder radiographs 02/28/2006.  FINDINGS: Normal anatomic alignment. No evidence for acute fracture or dislocation. Regional soft tissues are unremarkable. Visualized right hemithorax is unremarkable.  IMPRESSION: No acute osseous abnormality involving the right shoulder.  No significant osseous degenerative change.   Electronically Signed   By: Lovey Newcomer M.D.   On: 02/04/2014 18:24   Mr Brain Wo Contrast  02/02/2014   CLINICAL DATA:  Increasing confusion.  Stroke.  EXAM: MRI HEAD WITHOUT CONTRAST  TECHNIQUE: Multiplanar, multiecho pulse sequences of the brain and surrounding structures were obtained without intravenous contrast.  COMPARISON:  Head CT 02/01/2014  FINDINGS: Images are mildly degraded by motion.  There is no evidence of acute infarct, intracranial hemorrhage, mass, midline shift, or extra-axial fluid collection. Incidental note is made of a cavum septum pellucidum et vergae. Patchy and confluent T2 hyperintensities in the subcortical and deep cerebral white matter and brainstem are nonspecific but compatible with moderate chronic small vessel ischemic disease. There is mild generalized cerebral atrophy.  Orbits are unremarkable. Paranasal sinuses and mastoid air cells are clear. Major intracranial vascular flow voids are preserved.  IMPRESSION: 1. No acute intracranial abnormality. 2. Moderate chronic small vessel ischemic disease.   Electronically Signed   By: Logan Bores   On: 02/02/2014 19:36        Zimri Brennen A. Merlene Laughter, M.D.  Diplomate, Tax adviser of Psychiatry and Neurology ( Neurology). 02/04/2014, 7:12 PM

## 2014-02-04 NOTE — Progress Notes (Signed)
[facility] PROGRESS NOTE  [name] ZOX:[id number] DOB: 03/24/1941 DOA: 02/01/2014 PCP: Toma DeitersHASANAJ,XAJE A, MD  Assessment/Plan: 1. Acute encephalopathy -Likely multifactorial with possible urinary tract infection, acute on chronic renal failure contributors, although I think that major depression may be a significant component of this. She is awake, alert and oriented x3, has a flat affect. She has several losses, with her husband dying of cancer several months ago and her sister passing away 2 days ago. Family members reporting that she had a significant decline after the recent death of her sister. Behavioral health consulted however, she did declined.   2.  Tremors -Patient having resting tremors, with possible associated rigidity to upper extremities and cogwheeling. She is not on antipsychotic medications which would have the potential for extrapyramidal symptoms.  -I am suspicious that tremors may be related to Parkinson's, will consult neurology.   3. Acute on chronic kidney injury. -Given patient's likely underlying depression it is possible that she has had minimal by mouth intake and kidney failure related to prerenal azotemia -Continue gentle IV fluid hydration with normal saline running at 75 mL/hour  4. Possible urinary tract infection -Lab showing an elevated white count of 14,000, patient have a low-grade fever of 100.4. -Has history of ileal conduit, make a urine analysis difficult to interpret -Given elevated white blood count an overall functional decline will treat empirically with IV antibiotic therapy  5. History of chronic systolic congestive heart failure -She currently appears compensated. Her last transthoracic echocardiogram was performed on 10/31/2011 which showed an ejection fraction 55-60% -She is receiving IV fluids will monitor closely for overload   Code Status: Full Code Family Communication: Spoke with her daughter present at  bedside Disposition Plan: Anticipate discharge to home   Consultants: Neurology   Antibiotics:  Rocephin 1 g IV q 24 hours  HPI/Subjective: Patient remains minimally verbal on my evaluation, no change from yesterday's evaluation  Objective: Filed Vitals:   02/04/14 1324  BP: 125/53  Pulse: 65  Temp: 98.8 F (37.1 C)  Resp: 18    Intake/Output Summary (Last 24 hours) at 02/04/14 1408 Last data filed at 02/04/14 1300  Gross per 24 hour  Intake 1096.25 ml  Output   2775 ml  Net -1678.75 ml   Filed Weights   02/01/14 2014 02/04/14 0408  Weight: 49.896 kg (110 lb) 54.658 kg (120 lb 8 oz)    Exam:   General:  Flat affect, she is awake and alert oriented x3  Cardiovascular: Regular rate and rhythm normal S1-S2  Respiratory: Clear to auscultation bilaterally, normal respiratory effort  Abdomen: Soft nontender nondistended  Musculoskeletal: No edema  Neurological: There is some rigidity in bilateral upper extremities, +cogwheeling, no shuffling gait.   Data Reviewed: Basic Metabolic Panel:  Recent Labs Lab 02/01/14 2215 02/02/14 0619 02/03/14 0551 02/04/14 0638  NA 134* 139 142 143  K 3.7 3.5* 3.7 4.1  CL 95* 100 105 107  CO2 24 26 26 25   GLUCOSE 121* 100* 99 90  BUN 51* 49* 45* 41*  CREATININE 2.19* 2.09* 1.87* 1.68*  CALCIUM 9.1 8.4 8.4 8.7   Liver Function Tests:  Recent Labs Lab 02/01/14 2215  AST 15  ALT 14  ALKPHOS 111  BILITOT 0.3  PROT 6.9  ALBUMIN 3.1*    Recent Labs Lab 02/01/14 2215  LIPASE 42   No results found for this basename: AMMONIA,  in the last 168 hours CBC:  Recent Labs Lab 02/01/14 2215 02/03/14 0551 02/04/14 [id number]  WBC 14.0* 11.4* 7.3  NEUTROABS 11.1*  --   --   HGB 12.0 11.5* 11.4*  HCT 36.0 34.8* 34.8*  MCV 96.5 96.9 97.5  PLT 137* 158 181   Cardiac Enzymes:  Recent Labs Lab 02/01/14 2215  TROPONINI <0.30   BNP (last 3 results)  Recent Labs  02/01/14 2215  PROBNP 314.7*   CBG: No  results found for this basename: GLUCAP,  in the last 168 hours  Recent Results (from the past 240 hour(s))  URINE CULTURE     Status: None   Collection Time    02/01/14 11:35 PM      Result Value Ref Range Status   Specimen Description URINE, CLEAN CATCH   Final   Special Requests NONE   Final   Culture  Setup Time     Final   Value: 02/02/2014 13:10     Performed at [facility] Count     Final   Value: >=100,000 COLONIES/ML     Performed at [facility]   Culture     Final   Value: Multiple bacterial morphotypes present, none predominant. Suggest appropriate recollection if clinically indicated.     Performed at [facility]   Report Status 02/03/2014 FINAL   Final     Studies: Mr Brain Wo Contrast  02/02/2014   CLINICAL DATA:  Increasing confusion.  Stroke.  EXAM: MRI HEAD WITHOUT CONTRAST  TECHNIQUE: Multiplanar, multiecho pulse sequences of the brain and surrounding structures were obtained without intravenous contrast.  COMPARISON:  Head CT 02/01/2014  FINDINGS: Images are mildly degraded by motion.  There is no evidence of acute infarct, intracranial hemorrhage, mass, midline shift, or extra-axial fluid collection. Incidental note is made of a cavum septum pellucidum et vergae. Patchy and confluent T2 hyperintensities in the subcortical and deep cerebral white matter and brainstem are nonspecific but compatible with moderate chronic small vessel ischemic disease. There is mild generalized cerebral atrophy.  Orbits are unremarkable. Paranasal sinuses and mastoid air cells are clear. Major intracranial vascular flow voids are preserved.  IMPRESSION: 1. No acute intracranial abnormality. 2. Moderate chronic small vessel ischemic disease.   Electronically Signed   By: Sebastian Ache   On: 02/02/2014 19:36    Scheduled Meds: . ALPRAZolam  0.5 mg Oral TID  . antiseptic oral rinse  7 mL Mouth Rinse BID  . aspirin EC  81 mg Oral Daily  . calcitRIOL  0.5  mcg Oral Daily  . calcium-vitamin D  1 tablet Oral Q breakfast  . carvedilol  25 mg Oral BID WC  . cefTRIAXone (ROCEPHIN)  IV  1 g Intravenous Q24H  . citalopram  20 mg Oral Daily  . donepezil  10 mg Oral QPM  . feeding supplement (RESOURCE BREEZE)  1 Container Oral TID BM  . heparin  5,000 Units Subcutaneous 3 times per day  . ipratropium-albuterol  3 mL Nebulization QID  . mirtazapine  15 mg Oral QHS  . mometasone-formoterol  2 puff Inhalation BID  . sodium bicarbonate  650 mg Oral TID   Continuous Infusions: . sodium chloride 75 mL/hr at 02/03/14 1230    Active Problems:   Weakness   Depression   Hypertension   Chronic kidney disease   CHF (congestive heart failure)   Dementia   Renal failure (ARF), acute on chronic   UTI (lower urinary tract infection)   Abdominal pain, chronic, left lower quadrant   Acute encephalopathy   Dehydration  CKD (chronic kidney disease) stage 3, GFR 30-59 ml/min    Time spent: 25 min    Jeralyn Bennett  [facility] Pager [phone]. If 7PM-7AM, please contact night-coverage at [email or web address], password [facility] 02/04/2014, 2:08 PM  LOS: 3 days

## 2014-02-04 NOTE — Progress Notes (Signed)
ANTIBIOTIC CONSULT NOTE  Pharmacy Consult for Rocephin Indication: UTI  Allergies  Allergen Reactions  . Demerol [Meperidine] Other (See Comments)    Reaction is unknown  . Ketorolac Tromethamine Rash   Patient Measurements: Height: 5' 5.5" (166.4 cm) Weight: 120 lb 8 oz (54.658 kg) IBW/kg (Calculated) : 58.15  Vital Signs: Temp: 98.1 F (36.7 C) (08/13 0408) Temp src: Oral (08/13 0408) BP: 133/54 mmHg (08/13 0838) Pulse Rate: 61 (08/13 0838) Intake/Output from previous day: 08/12 0701 - 08/13 0700 In: 616.3 [P.O.:240; I.V.:376.3] Out: 2725 [Urine:500; Stool:2225] Intake/Output from this shift:    Labs:  Recent Labs  02/01/14 2215 02/02/14 0619 02/03/14 0551 02/04/14 0638  WBC 14.0*  --  11.4* 7.3  HGB 12.0  --  11.5* 11.4*  PLT 137*  --  158 181  CREATININE 2.19* 2.09* 1.87* 1.68*   Estimated Creatinine Clearance: 25.8 ml/min (by C-G formula based on Cr of 1.68). No results found for this basename: VANCOTROUGH, Leodis Binet, VANCORANDOM, GENTTROUGH, GENTPEAK, GENTRANDOM, TOBRATROUGH, TOBRAPEAK, TOBRARND, AMIKACINPEAK, AMIKACINTROU, AMIKACIN,  in the last 72 hours   Microbiology: Recent Results (from the past 720 hour(s))  URINE CULTURE     Status: None   Collection Time    02/01/14 11:35 PM      Result Value Ref Range Status   Specimen Description URINE, CLEAN CATCH   Final   Special Requests NONE   Final   Culture  Setup Time     Final   Value: 02/02/2014 13:10     Performed at [facility] Count     Final   Value: >=100,000 COLONIES/ML     Performed at [facility]   Culture     Final   Value: Multiple bacterial morphotypes present, none predominant. Suggest appropriate recollection if clinically indicated.     Performed at [facility]   Report Status 02/03/2014 FINAL   Final    Medical History: Past Medical History  Diagnosis Date  . Gastroesophageal reflux   . Chronic UTI   . AV fistula     clotted in her left  forearm  . Cellulitis   . Pneumothorax     secondary to central line placement  . Myocardial infarction 2011; 2012  . Numbness and tingling of both legs   . Anginal pain   . Heart murmur   . Dysrhythmia     "heart beats real fast"  . Chronic anemia   . Pneumonia     "one time"  . Chronic chest pain 01/14/12    1-2 X/day/H&P  . Arthritis     "at different places"  . Anxiety   . Depression   . Headache(784.0)     "alot; not daily"  . Frequent falls   . Dementia   . Collagen vascular disease   . S/P ileal conduit     post cystectomy/ileal conduit  . Anemia     Received IV Feraheme (x) 2 at [facility]  . Iron deficiency     Received IV Feraheme (x) 2 at [facility].  . Dysarthria 03/31/13    Speech is almost a little bit dysarthritic  . Unsteady     Stronger since she came out of nursing home & is walking with a cane  . Edema 03/31/13    Gained 9 pounds overnight & telephone nurse instructed her to take 20mg  of Lasix bid on 03/30/13. Still has swelling in legs.  . Metabolic acidosis  Chronic  . Hypertension     Good control  . CHF (congestive heart failure)     Systolic heart failure EF 25-30% by ECHO 2012  . Altered mental state     Recurrent episodes, with confusion variously related to narcotic use, infection, etc. and has required hospitilization, nursing home stays for rehab related to weakness, encephalopathy, etc.  . History of sepsis 2012    With E.Coli bacteremia  . E coli bacteremia 2012    History of sepsis & E.Coli bacteremia  . Secondary hyperparathyroidism     Dr. Caryn SectionFox had a sestamibi scan of her neck in May 2014 which was negative for parathyroid activity in her neck.  . Chronic kidney disease (CKD), stage III (moderate)     [name]/notes 09/15/2013  . Hydronephrosis     She has chronic mild left   . Single kidney     post right nephrectomy  . Renal insufficiency    Rocephin 8/11 >>  Assessment: 73yo female admitted with lethargy and hx recurrent UTI.    Urine cx negative.  Patient is currently afebrile with  Normal WBC.   Renal function improving with hydration.   Goal of Therapy:  Eradicate infection.  Plan:   Continue Rocephin 1gm IV q24hrs (no renal adjustment needed)  Duration of therapy per MD- consider change to oral cephalosporin to complete course  Pharmacy to sign off.  Please re-consult if needed.   Elson ClanLilliston, Mikaylah Libbey Michelle 02/04/2014,9:07 AM

## 2014-02-04 NOTE — Progress Notes (Signed)
Speech Language Pathology Treatment: Dysphagia  Patient Details Name: [name] MRN: [id number] DOB: [birth year] Today's Date: 02/04/2014 Time: 1855-0158 SLP Time Calculation (min): 15 min  Assessment / Plan / Recommendation Clinical Impression  Pt seen for diet tolerance. She reports no issues/difficulty with swallowing AM meal and willingly drank some water in session to demonstrated good tolerance. Pt more alert than when seen on Tuesday. Her daughters were not present this AM as Pt shared that they were attending her sister's funeral at [place] at 11 AM. Pt became tearful, but allowed SLP to comfort. Pt expressed that "her world ended" when her husband passed away rather abruptly in March of this year and that her sister's death was difficult. Pt did seem to also find comfort in sharing fond memories of her life with her husband. Perhaps pt would be more willing to talk to a counselor without having it be a true psych evaluation. Pt apparently refused evaluation. No further SLP intervention indicated at this time. SLP will sign off.    HPI HPI: [name] is a 73 y.o. 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 Patient has a history of significant anxiety and depression, and last time she was hospitalized in March of 2015 with worsening of her psychiatric symptoms shortly after her husband passed away. Her daughters tell me that patient's sister died today from brain cancer. Over the last couple weeks, patient has been very withdrawn and with flat affect. Her daughter also tells me that today she seemed to be weaker and has noticed patient to be lethargic and with saliva  drooping out of her mouth. She was also concerned that she has been weaker and she thinks patient's right eye is "different" as well as the corner of her mouth on right is "different" and was also concerned for a stroke. Patient is very flat in the ED, the only thing that she endorses is feeling "sad" and having worse back pain. Family associates her back pain with a UTI which she has had in the past, however never being this lethargic. In the ED, labs show acute on chronic renal failure and mild leukocytosis of 14.   Pertinent Vitals Pain Assessment: No/denies pain  SLP Plan  All goals met;Discharge SLP treatment due to (comment) (Goals met)    Recommendations Diet recommendations: Regular;Thin liquid Liquids provided via: Cup;Straw Medication Administration: Whole meds with liquid Supervision: Patient able to self feed;Intermittent supervision to cue for compensatory strategies Compensations: Slow rate Postural Changes and/or Swallow Maneuvers: Seated upright 90 degrees;Upright 30-60 min after meal              Oral Care Recommendations: Oral care BID Follow up Recommendations: None Plan: All goals met;Discharge SLP treatment due to (comment) (Goals met)       Thank you,  Genene Churn, [place]  [place] 02/04/2014, 12:30 PM

## 2014-02-04 NOTE — Progress Notes (Signed)
Physical Therapy Treatment Patient Details Name: [name] MRN: [id number] DOB: 26-Jul-1940 Today's Date: 02/04/2014    History of Present Illness BEAUTIFUL PENSYL is a 73 y.o. 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 Patient has a history of significant anxiety and depression, and last time she was hospitalized in March of 2015 with worsening of her psychiatric symptoms shortly after her husband passed away. Her daughters tell me that patient's sister died today from brain cancer. Over the last couple weeks, patient has been very withdrawn and with flat affect. Her daughter also tells me that today she seemed to be weaker and has noticed patient to be lethargic and with saliva drooping out of her mouth. She was also concerned that she has been weaker and she thinks patient's right eye is "different" as well as the corner of her mouth on right is "different" and was also concerned for a stroke. Patient is very flat in the ED, the only thing that she endorses is feeling "sad" and having worse back pain. Family associates her back pain with a UTI which she has had in the past, however never being this lethargic. In the ED, labs show acute on chronic renal failure and mild leukocytosis of 14.     PT Comments    Per MD, pt complaining of increased Rt shoulder pain today and awaiting Neuro consult due to flat affect/essential tremors.  Pt reports pain 7/10 in Rt shoulder, with achy pain, hot pack applied by NSG and kept on during PT treatment.  With palpation, pt reports increased complaints of pain in supraspinatus M/T, and biceps T insertion, probably related to impingement syndrome  or a tendonitis due to fall when pt fractured her Rt wrist.  Informed pt/family to tell HHPT about shoulder pain and they would be able to give pt stretching/strengthening exercises to address the shoulder and correct posture/alignement to decrease inflammation/stress to tissues.  Pt demonstrated great tolerance with PT treatment today, without LOB or complaints of pain with standing therapeutic exercises.  Pt did hold onto to RW during standing exercises for balance.  Pt was able to amb the hallways and ascend/descend stairs without AD.  Noted step through gait patterning to ascend 5 steps and step to gait patterning to descend stairs with 1 handrail assist.  Goal met for stairs climbing.  Recommend continued PT while in the hospital to address posture/shoulder alignment and possible outcome of neuro consult.    Follow Up Recommendations  Home health PT     Equipment Recommendations  None recommended by PT           Mobility  Bed Mobility Overal bed mobility: Modified Independent                Transfers Overall transfer level: Needs assistance Equipment used: None Transfers: Sit to/from Stand Sit to Stand: Supervision            Ambulation/Gait Ambulation/Gait assistance: Supervision Ambulation Distance (Feet): 250 Feet Assistive device: None Gait Pattern/deviations: Step-through pattern;Decreased dorsiflexion - right;Decreased dorsiflexion - left   Gait velocity interpretation: Below normal speed for age/gender General Gait Details: Pt able to relax arms next to body today after VC, and demonstrate mild arm swing  greater on Lt > Rt   Stairs Stairs: Yes Stairs assistance: Supervision Stair Management: One rail Left Number of Stairs: 5 General stair comments: Step through for ascend stairs, step to for descend stairs         Cognition Arousal/Alertness: Awake/alert Behavior During Therapy: WFL for tasks assessed/performed Overall Cognitive Status: Within  Functional Limits for tasks assessed                      Exercises General Exercises - Lower Extremity Hip Flexion/Marching: 10 reps;Strengthening;Both;Standing Toe Raises: 10 reps;Strengthening;Both;Standing Heel Raises: 10 reps;Strengthening;Both;Standing Mini-Sqauts: 10 reps;Strengthening;Both;Standing Other Exercises Other Exercises: Forward - Backward Walking 10' x3 Other Exercises: Side Stepping 10' x3    General Comments General comments (skin integrity, edema, etc.): Tenderness with palpation on supraspinatus M/T, and biceps T      Pertinent Vitals/Pain Pain Assessment: 0-10 Pain Score: 7  Pain Location: Rt shoulder Pain Descriptors / Indicators: Aching Pain Intervention(s): Heat applied;Repositioned (heat applied prior to PT entering room; heat on for tx )           PT Goals (current goals can now be found in the care plan section) Progress towards PT goals: Progressing toward goals;Goals met and updated - see care plan    Frequency  Min 3X/week    PT Plan Current plan remains appropriate       End of Session Equipment Utilized During Treatment: Gait belt Activity Tolerance: Patient tolerated treatment well Patient left: in bed;with call bell/phone within reach;with family/visitor present     Time: 9068-9340 PT Time Calculation (min): 36 min  Charges:  $Gait Training: 8-22 mins $Therapeutic Exercise: 8-22 mins                     Zyon Grout 02/04/2014, 4:41 PM

## 2014-02-05 ENCOUNTER — Inpatient Hospital Stay (HOSPITAL_COMMUNITY): Payer: Medicare Other

## 2014-02-05 ENCOUNTER — Inpatient Hospital Stay (HOSPITAL_COMMUNITY)
Admit: 2014-02-05 | Discharge: 2014-02-05 | Disposition: A | Discharge status: Home / Self Care | Payer: Medicare Other | Attending: Neurology | Admitting: Neurology

## 2014-02-05 DIAGNOSIS — N390 Urinary tract infection, site not specified: Secondary | ICD-10-CM

## 2014-02-05 DIAGNOSIS — N183 Chronic kidney disease, stage 3 unspecified: Secondary | ICD-10-CM

## 2014-02-05 DIAGNOSIS — G20 Parkinson's disease: Secondary | ICD-10-CM

## 2014-02-05 DIAGNOSIS — I4891 Unspecified atrial fibrillation: Secondary | ICD-10-CM

## 2014-02-05 LAB — CBC
HCT: 34.7 % — ABNORMAL LOW (ref 36.0–46.0)
Hemoglobin: 11.3 g/dL — ABNORMAL LOW (ref 12.0–15.0)
MCH: 31.7 pg (ref 26.0–34.0)
MCHC: 32.6 g/dL (ref 30.0–36.0)
MCV: 97.5 fL (ref 78.0–100.0)
PLATELETS: 213 10*3/uL (ref 150–400)
RBC: 3.56 MIL/uL — AB (ref 3.87–5.11)
RDW: 15 % (ref 11.5–15.5)
WBC: 7.5 10*3/uL (ref 4.0–10.5)

## 2014-02-05 LAB — BASIC METABOLIC PANEL
ANION GAP: 12 (ref 5–15)
BUN: 36 mg/dL — ABNORMAL HIGH (ref 6–23)
CALCIUM: 9.2 mg/dL (ref 8.4–10.5)
CO2: 25 meq/L (ref 19–32)
Chloride: 103 mEq/L (ref 96–112)
Creatinine, Ser: 1.62 mg/dL — ABNORMAL HIGH (ref 0.50–1.10)
GFR calc non Af Amer: 30 mL/min — ABNORMAL LOW (ref >90)
GFR, EST AFRICAN AMERICAN: 35 mL/min — AB (ref >90)
Glucose, Bld: 103 mg/dL — ABNORMAL HIGH (ref 70–99)
Potassium: 3.7 mEq/L (ref 3.7–5.3)
SODIUM: 140 meq/L (ref 137–147)

## 2014-02-05 LAB — HEPARIN LEVEL (UNFRACTIONATED): HEPARIN UNFRACTIONATED: 0.43 [IU]/mL (ref 0.30–0.70)

## 2014-02-05 LAB — TROPONIN I: Troponin I: <0.3 ng/mL (ref <0.30)

## 2014-02-05 LAB — TSH: TSH: 2.83 u[IU]/mL (ref 0.350–4.500)

## 2014-02-05 LAB — PRO B NATRIURETIC PEPTIDE: Pro B Natriuretic peptide (BNP): 2256 pg/mL — ABNORMAL HIGH (ref 0–125)

## 2014-02-05 LAB — GLUCOSE, CAPILLARY: Glucose-Capillary: 92 mg/dL (ref 70–99)

## 2014-02-05 MED ORDER — HEPARIN (PORCINE) IN NACL 100-0.45 UNIT/ML-% IJ SOLN
900.0000 [IU]/h | INTRAMUSCULAR | Status: DC
  Administered 2014-02-05 – 2014-02-06 (×2): 800 [IU]/h via INTRAVENOUS
  Filled 2014-02-05 (×3): qty 250

## 2014-02-05 MED ORDER — HEPARIN BOLUS VIA INFUSION
2000.0000 [IU] | Freq: Once | INTRAVENOUS | Status: AC
  Administered 2014-02-05: 2000 [IU] via INTRAVENOUS
  Filled 2014-02-05: qty 2000

## 2014-02-05 MED ORDER — FUROSEMIDE 10 MG/ML IJ SOLN
20.0000 mg | Freq: Once | INTRAMUSCULAR | Status: AC
  Administered 2014-02-05: 20 mg via INTRAVENOUS
  Filled 2014-02-05: qty 2

## 2014-02-05 MED ORDER — TRAMADOL HCL 50 MG PO TABS
50.0000 mg | ORAL_TABLET | Freq: Four times a day (QID) | ORAL | Status: DC | PRN
  Administered 2014-02-05 – 2014-02-08 (×7): 50 mg via ORAL
  Filled 2014-02-05 (×7): qty 1

## 2014-02-05 MED ORDER — DILTIAZEM HCL 30 MG PO TABS
30.0000 mg | ORAL_TABLET | Freq: Three times a day (TID) | ORAL | Status: DC
  Administered 2014-02-05 (×2): 30 mg via ORAL
  Filled 2014-02-05 (×4): qty 1

## 2014-02-05 NOTE — Progress Notes (Signed)
Patient ID: [name], female   DOB: 17-Feb-1941, 73 y.o.   MRN: [id number]  [place] A. Merlene Laughter, MD     [email or web address]          [name] is an 73 y.o. female.   Assessment/Plan: 1. Subacute onset of parkinsonism. The most likely This is idiopathic Parkinson disease although there are some atypical features. The patient has had episodes of confusion and encephalopathy although they're in the setting of toxic metabolic derangements including UTI and the opioids. There is no clear evidence of dementia at this point in time whether there may be some mild cognitive impairment. There is no evidence of medication-induced parkinsonism at this time. The patient will be started on Sinemet and titrated to at least 25/100 3 times a day. She already has been worked up for cognitive impairment including dementia labs done March of this year. These were unremarkable. An EEG will be obtained. We suggest physical and occupational therapy.   Overall, she is about the same with the moderately improvements tremors. She has tolerated the Sinemet so far.  GENERAL: This is a pleasant female who has mild global generalized psychomotor slowing.  HEENT: Supple. Atraumatic normocephalic. Near continuous tremor of the jaw high-frequency low amplitude. There is evidence of facial bradykinesia with a mask face.  ABDOMEN: soft  EXTREMITIES: No edema  BACK: Normal.  SKIN: Normal by inspection.  MENTAL STATUS: Alert and oriented. Speech, language and cognition are generally intact. Judgment and insight fair.  CRANIAL NERVES: Pupils are equal, round and reactive to light and accommodation; extra ocular movements are full, there is no significant nystagmus; visual fields are full; upper and lower facial muscles are normal in strength and symmetric, there is no flattening of the nasolabial folds; tongue is midline; uvula is midline; shoulder elevation is normal.  MOTOR: Normal tone, bulk  and strength; no pronator drift.  COORDINATION: There is global bradykinesia. This is appreciated both axillae and appendicular. She has a rather severe bradykinesia with significantly impaired finger tapping and fine finger movements. There is no cogwheeling even with augmentation. There is symmetric high frequency low amplitude pin rolling Rest tremor involving the hands bilaterally. This has improved significantly however. Some tremors noted in the postural position but the most prominent tremors in the rest position. No dysmetria is observed.       Objective: Vital signs in last 24 hours: Temp:  [98 F (36.7 C)-98.5 F (36.9 C)] 98.4 F (36.9 C) (08/14 1319) Pulse Rate:  [57-74] 74 (08/14 1319) Resp:  [16-18] 18 (08/14 1319) BP: (101-137)/(48-73) 111/70 mmHg (08/14 1319) SpO2:  [97 %-99 %] 97 % (08/14 1519)  Intake/Output from previous day: 08/13 0701 - 08/14 0700 In: 3366.3 [P.O.:480; I.V.:2736.3; IV Piggyback:150] Out: 2950 [Urine:1200; Stool:1750] Intake/Output this shift: Total I/O In: 480 [P.O.:480] Out: 2000 [Urine:750; Stool:1250] Nutritional status: Cardiac   Lab Results: Results for orders placed during the hospital encounter of 02/01/14 (from the past 48 hour(s))  CBC     Status: Abnormal   Collection Time    02/04/14  6:38 AM      Result Value Ref Range   WBC 7.3  4.0 - 10.5 K/uL   RBC 3.57 (*) 3.87 - 5.11 MIL/uL   Hemoglobin 11.4 (*) 12.0 - 15.0 g/dL   HCT 34.8 (*) 36.0 - 46.0 %   MCV 97.5  78.0 - 100.0 fL   MCH 31.9  26.0 - 34.0 pg   MCHC 32.8  30.0 - 36.0  g/dL   RDW 15.2  11.5 - 15.5 %   Platelets 181  150 - 400 K/uL  BASIC METABOLIC PANEL     Status: Abnormal   Collection Time    02/04/14  6:38 AM      Result Value Ref Range   Sodium 143  137 - 147 mEq/L   Potassium 4.1  3.7 - 5.3 mEq/L   Chloride 107  96 - 112 mEq/L   CO2 25  19 - 32 mEq/L   Glucose, Bld 90  70 - 99 mg/dL   BUN 41 (*) 6 - 23 mg/dL   Creatinine, Ser 1.68 (*) 0.50 - 1.10 mg/dL     Calcium 8.7  8.4 - 10.5 mg/dL   GFR calc non Af Amer 29 (*) >90 mL/min   GFR calc Af Amer 34 (*) >90 mL/min   Comment: (NOTE)     The eGFR has been calculated using the CKD EPI equation.     This calculation has not been validated in all clinical situations.     eGFR's persistently <90 mL/min signify possible Chronic Kidney     Disease.   Anion gap 11  5 - 15  GLUCOSE, CAPILLARY     Status: None   Collection Time    02/05/14  7:32 AM      Result Value Ref Range   Glucose-Capillary 92  70 - 99 mg/dL  PRO B NATRIURETIC PEPTIDE     Status: Abnormal   Collection Time    02/05/14 11:25 AM      Result Value Ref Range   Pro B Natriuretic peptide (BNP) 2256.0 (*) 0 - 125 pg/mL  TROPONIN I     Status: None   Collection Time    02/05/14 11:25 AM      Result Value Ref Range   Troponin I <0.30  <0.30 ng/mL   Comment:            Due to the release kinetics of cTnI,     a negative result within the first hours     of the onset of symptoms does not rule out     myocardial infarction with certainty.     If myocardial infarction is still suspected,     repeat the test at appropriate intervals.  CBC     Status: Abnormal   Collection Time    02/05/14 12:00 PM      Result Value Ref Range   WBC 7.5  4.0 - 10.5 K/uL   RBC 3.56 (*) 3.87 - 5.11 MIL/uL   Hemoglobin 11.3 (*) 12.0 - 15.0 g/dL   HCT 34.7 (*) 36.0 - 46.0 %   MCV 97.5  78.0 - 100.0 fL   MCH 31.7  26.0 - 34.0 pg   MCHC 32.6  30.0 - 36.0 g/dL   RDW 15.0  11.5 - 15.5 %   Platelets 213  150 - 400 K/uL  BASIC METABOLIC PANEL     Status: Abnormal   Collection Time    02/05/14 12:00 PM      Result Value Ref Range   Sodium 140  137 - 147 mEq/L   Potassium 3.7  3.7 - 5.3 mEq/L   Chloride 103  96 - 112 mEq/L   CO2 25  19 - 32 mEq/L   Glucose, Bld 103 (*) 70 - 99 mg/dL   BUN 36 (*) 6 - 23 mg/dL   Creatinine, Ser 1.62 (*) 0.50 - 1.10 mg/dL   Calcium 9.2  8.4 - 10.5 mg/dL   GFR calc non Af Amer 30 (*) >90 mL/min   GFR calc Af Amer  35 (*) >90 mL/min   Comment: (NOTE)     The eGFR has been calculated using the CKD EPI equation.     This calculation has not been validated in all clinical situations.     eGFR's persistently <90 mL/min signify possible Chronic Kidney     Disease.   Anion gap 12  5 - 15    Lipid Panel No results found for this basename: CHOL, TRIG, HDL, CHOLHDL, VLDL, LDLCALC,  in the last 72 hours  Studies/Results: Dg Chest 2 View  02/05/2014   CLINICAL DATA:  Chest pain.  Short of breath.  EXAM: CHEST  2 VIEW  COMPARISON:  02/01/2014.  09/15/2013.  FINDINGS: Artifact overlies the chest. Heart size is normal. There is calcification of the thoracic aorta there are new small effusions with mild patchy density at the lung bases. This could be atelectasis or mild basilar pneumonia. Upper lungs remain clear.  IMPRESSION: New bilateral small effusions and patchy basilar lung density. This could be atelectasis or mild basilar pneumonia.   Electronically Signed   By: Nelson Chimes M.D.   On: 02/05/2014 13:50   Dg Shoulder Right  02/04/2014   CLINICAL DATA:  Right shoulder pain no injury.  EXAM: RIGHT SHOULDER - 2+ VIEW  COMPARISON:  Right shoulder radiographs 02/28/2006.  FINDINGS: Normal anatomic alignment. No evidence for acute fracture or dislocation. Regional soft tissues are unremarkable. Visualized right hemithorax is unremarkable.  IMPRESSION: No acute osseous abnormality involving the right shoulder.  No significant osseous degenerative change.   Electronically Signed   By: Lovey Newcomer M.D.   On: 02/04/2014 18:24    Medications:  Scheduled Meds: . ALPRAZolam  0.5 mg Oral TID  . antiseptic oral rinse  7 mL Mouth Rinse BID  . aspirin EC  81 mg Oral Daily  . calcitRIOL  0.5 mcg Oral Daily  . calcium-vitamin D  1 tablet Oral Q breakfast  . carbidopa-levodopa  1 tablet Oral BID  . [START ON 02/11/2014] carbidopa-levodopa  1 tablet Oral TID  . carvedilol  25 mg Oral BID WC  . cefTRIAXone (ROCEPHIN)  IV  1 g  Intravenous Q24H  . citalopram  20 mg Oral Daily  . diltiazem  30 mg Oral 3 times per day  . donepezil  10 mg Oral QPM  . feeding supplement (RESOURCE BREEZE)  1 Container Oral TID BM  . ipratropium-albuterol  3 mL Nebulization QID  . mirtazapine  15 mg Oral QHS  . mometasone-formoterol  2 puff Inhalation BID  . sodium bicarbonate  650 mg Oral TID   Continuous Infusions: . heparin 800 Units/hr (02/05/14 1433)   PRN Meds:.acetaminophen, senna-docusate, traMADol     LOS: 4 days   Shatha Hooser A. Merlene Laughter, M.D.  Diplomate, Tax adviser of Psychiatry and Neurology ( Neurology).

## 2014-02-05 NOTE — Clinical Documentation Improvement (Signed)
RD note mentions severe malnutrition; moderate fat and muscle depletion; BMI 18.02.    Please clarify if diagnosis is present and document in your progress note and discharge summary.     Thank You, Cheree DittoVangela R Tyquan Carmickle ,RN Clinical Documentation Specialist:    [phone] [facility]- [facility] Information Management

## 2014-02-05 NOTE — Progress Notes (Signed)
[facility] PROGRESS NOTE  [name] VWU:[id number] DOB: 1941-05-11 DOA: 02/01/2014 PCP: Toma Deiters, MD  Assessment/Plan:  1.  Atrial Fibrillation with rapid ventricular response -Patient going into Afib with RVR this morning at approximately 9 am. Telemetry reporting that her ventricular rates ranged from 30 to 140's. EKG showed Afib with HR's in the 130's.  -Last 2D Echo done on 10/30/2013 showed EF of 55-60% -Will repeat echo, check TSH, BNP, troponins, CBC and BMP -Cardiology consultation placed -HR's currently in the 90's to low 100's remaining in Afib, last blood pressure 101/73 -Await further recommendations from Cardiology  1. Acute encephalopathy -Likely multifactorial with possible urinary tract infection, acute on chronic renal failure contributors, although I think that major depression may be a significant component of this. She is awake, alert and oriented x3, has a flat affect. She has several losses, with her husband dying of cancer several months ago and her sister passing away 2 days ago. Family members reporting that she had a significant decline after the recent death of her sister. Behavioral health consulted however, she did declined.   2.  Parkinsons -Patient having resting tremors, with possible associated rigidity to upper extremities and cogwheeling. She is not on antipsychotic medications which would have the potential for extrapyramidal symptoms.  -Neurology consulted, she was started on carbidopa/levadopa  3. Acute on chronic kidney injury. -Given patient's likely underlying depression it is possible that she has had minimal by mouth intake and kidney failure related to prerenal azotemia -Resolving  4. Possible urinary tract infection -Lab showing an elevated white count of 14,000, patient have a low-grade fever of 100.4. -Has history of ileal conduit, make a urine analysis difficult to interpret -Given elevated white blood count an overall  functional decline was started on IV antibiotic therapy with rocephin  5. History of chronic systolic congestive heart failure -She currently appears compensated. Her last transthoracic echocardiogram was performed on 10/31/2011 which showed an ejection fraction 55-60%   Code Status: Full Code Family Communication: Spoke with her daughter present at bedside Disposition Plan: Anticipate discharge to home when medically stable   Consultants: Neurology Cardiology   Antibiotics:  Rocephin 1 g IV q 24 hours  HPI/Subjective: Patient complaining of CP and some shortness of breath, EKG revealed Afib with RVR  Objective: Filed Vitals:   02/05/14 0950  BP: 101/73  Pulse:   Temp: 98 F (36.7 C)  Resp: 18    Intake/Output Summary (Last 24 hours) at 02/05/14 1126 Last data filed at 02/05/14 0930  Gross per 24 hour  Intake 3366.25 ml  Output   3100 ml  Net 266.25 ml   Filed Weights   02/01/14 2014 02/04/14 0408  Weight: 49.896 kg (110 lb) 54.658 kg (120 lb 8 oz)    Exam:   General:  Flat affect, she is awake and alert oriented x3  Cardiovascular: Irregular rate and rhythm, normal S1S2, tachycardic  Respiratory: Clear to auscultation bilaterally, normal respiratory effort  Abdomen: Soft nontender nondistended  Musculoskeletal: No edema  Neurological: There is some rigidity in bilateral upper extremities, +cogwheeling, no shuffling gait.   Data Reviewed: Basic Metabolic Panel:  Recent Labs Lab 02/01/14 2215 02/02/14 0619 02/03/14 0551 02/04/14 0638  NA 134* 139 142 143  K 3.7 3.5* 3.7 4.1  CL 95* 100 105 107  CO2 24 26 26 25   GLUCOSE 121* 100* 99 90  BUN 51* 49* 45* 41*  CREATININE 2.19* 2.09* 1.87* 1.68*  CALCIUM 9.1 8.4 8.4 8.7  Liver Function Tests:  Recent Labs Lab 02/01/14 2215  AST 15  ALT 14  ALKPHOS 111  BILITOT 0.3  PROT 6.9  ALBUMIN 3.1*    Recent Labs Lab 02/01/14 2215  LIPASE 42   No results found for this basename: AMMONIA,   in the last 168 hours CBC:  Recent Labs Lab 02/01/14 2215 02/03/14 0551 02/04/14 0638  WBC 14.0* 11.4* 7.3  NEUTROABS 11.1*  --   --   HGB 12.0 11.5* 11.4*  HCT 36.0 34.8* 34.8*  MCV 96.5 96.9 97.5  PLT 137* 158 181   Cardiac Enzymes:  Recent Labs Lab 02/01/14 2215  TROPONINI <0.30   BNP (last 3 results)  Recent Labs  02/01/14 2215  PROBNP 314.7*   CBG:  Recent Labs Lab 02/05/14 0732  GLUCAP 92    Recent Results (from the past 240 hour(s))  URINE CULTURE     Status: None   Collection Time    02/01/14 11:35 PM      Result Value Ref Range Status   Specimen Description URINE, CLEAN CATCH   Final   Special Requests NONE   Final   Culture  Setup Time     Final   Value: 02/02/2014 13:10     Performed at [facility] Count     Final   Value: >=100,000 COLONIES/ML     Performed at [facility]   Culture     Final   Value: Multiple bacterial morphotypes present, none predominant. Suggest appropriate recollection if clinically indicated.     Performed at [facility]   Report Status 02/03/2014 FINAL   Final     Studies: Dg Shoulder Right  02/04/2014   CLINICAL DATA:  Right shoulder pain no injury.  EXAM: RIGHT SHOULDER - 2+ VIEW  COMPARISON:  Right shoulder radiographs 02/28/2006.  FINDINGS: Normal anatomic alignment. No evidence for acute fracture or dislocation. Regional soft tissues are unremarkable. Visualized right hemithorax is unremarkable.  IMPRESSION: No acute osseous abnormality involving the right shoulder.  No significant osseous degenerative change.   Electronically Signed   By: Annia Belt M.D.   On: 02/04/2014 18:24    Scheduled Meds: . ALPRAZolam  0.5 mg Oral TID  . antiseptic oral rinse  7 mL Mouth Rinse BID  . aspirin EC  81 mg Oral Daily  . calcitRIOL  0.5 mcg Oral Daily  . calcium-vitamin D  1 tablet Oral Q breakfast  . carbidopa-levodopa  1 tablet Oral BID  . [START ON 02/11/2014] carbidopa-levodopa  1  tablet Oral TID  . carvedilol  25 mg Oral BID WC  . cefTRIAXone (ROCEPHIN)  IV  1 g Intravenous Q24H  . citalopram  20 mg Oral Daily  . donepezil  10 mg Oral QPM  . feeding supplement (RESOURCE BREEZE)  1 Container Oral TID BM  . heparin  5,000 Units Subcutaneous 3 times per day  . ipratropium-albuterol  3 mL Nebulization QID  . mirtazapine  15 mg Oral QHS  . mometasone-formoterol  2 puff Inhalation BID  . sodium bicarbonate  650 mg Oral TID   Continuous Infusions:    Active Problems:   Weakness   Depression   Hypertension   Chronic kidney disease   CHF (congestive heart failure)   Dementia   Renal failure (ARF), acute on chronic   UTI (lower urinary tract infection)   Abdominal pain, chronic, left lower quadrant   Acute encephalopathy   Dehydration   CKD (  chronic kidney disease) stage 3, GFR 30-59 ml/min    Time spent: 35 min    Jeralyn BennettZAMORA, Laniesha Das  [facility] Pager [phone]. If 7PM-7AM, please contact night-coverage at [email or web address], password [email or web address] 02/05/2014, 11:26 AM  LOS: 4 days

## 2014-02-05 NOTE — Progress Notes (Signed)
Tele began sending alerts this morning when patient got up with PT.  Patients HR would bounce from 30 - 120's on tele.  Patient stated she "could feel her heart racing a little bit".  Patient at rest at this time in bed, HR still bouncing around to from 80 - 120's.  MD made aware.  Stat EKG ordered.  Patient has no other s/s at this time and BP WNL.

## 2014-02-05 NOTE — Consult Note (Signed)
Primary cardiologist: Dr Delane Ginger Consulting cardiologist: Dr Dina Rich  Clinical Summary Ms. Achey is a 73 y.o.female history of chronic systolic heart failure. Echo Jan 2012 LVEF 25-30% with moderate MR and severe TR, PASP 58. LVEF later normalized, echo 10/2011 LVEF 55-60%, with mild MR, mild-mod TR, and PASP . She also has a history of recurrent UTIs, CKD, HTN, depression, and dementia according notes.   Admitted 02/04/14 with confusion and lethargy. She is being treated for AKI, UTI, and possible new diagnosis of Parkinsons disease. Cardiology is consulted for new onset afib. EKG today with afib rates 130s, stable blood pressure. She has been on coreg 25mg  bid at home. Denies any significant issues with falls. Reports a few months ago occasional episodes of lightheadedness/dizziness. Does note some palpitations at times associated with SOB.     Allergies  Allergen Reactions  . Demerol [Meperidine] Other (See Comments)    Reaction is unknown  . Ketorolac Tromethamine Rash    Medications Scheduled Medications: . ALPRAZolam  0.5 mg Oral TID  . antiseptic oral rinse  7 mL Mouth Rinse BID  . aspirin EC  81 mg Oral Daily  . calcitRIOL  0.5 mcg Oral Daily  . calcium-vitamin D  1 tablet Oral Q breakfast  . carbidopa-levodopa  1 tablet Oral BID  . [START ON 02/11/2014] carbidopa-levodopa  1 tablet Oral TID  . carvedilol  25 mg Oral BID WC  . cefTRIAXone (ROCEPHIN)  IV  1 g Intravenous Q24H  . citalopram  20 mg Oral Daily  . donepezil  10 mg Oral QPM  . feeding supplement (RESOURCE BREEZE)  1 Container Oral TID BM  . heparin  5,000 Units Subcutaneous 3 times per day  . ipratropium-albuterol  3 mL Nebulization QID  . mirtazapine  15 mg Oral QHS  . mometasone-formoterol  2 puff Inhalation BID  . sodium bicarbonate  650 mg Oral TID     Infusions:     PRN Medications:  acetaminophen, senna-docusate, traMADol   Past Medical History  Diagnosis Date  .  Gastroesophageal reflux   . Chronic UTI   . AV fistula     clotted in her left forearm  . Cellulitis   . Pneumothorax     secondary to central line placement  . Myocardial infarction 2011; 2012  . Numbness and tingling of both legs   . Anginal pain   . Heart murmur   . Dysrhythmia     "heart beats real fast"  . Chronic anemia   . Pneumonia     "one time"  . Chronic chest pain 01/14/12    1-2 X/day/H&P  . Arthritis     "at different places"  . Anxiety   . Depression   . Headache(784.0)     "alot; not daily"  . Frequent falls   . Dementia   . Collagen vascular disease   . S/P ileal conduit     post cystectomy/ileal conduit  . Anemia     Received IV Feraheme (x) 2 at [facility]  . Iron deficiency     Received [facility] 2 [facility].  . Dysarthria 03/31/13    Speech is almost a little bit dysarthritic  . Unsteady     Stronger since she came out of nursing home & is walking with a cane  . Edema 03/31/13    Gained 9 pounds overnight & telephone nurse instructed her to take 20mg  of Lasix bid on 03/30/13. Still has  swelling in legs.  . Metabolic acidosis     Chronic  . Hypertension     Good control  . CHF (congestive heart failure)     Systolic heart failure EF 25-30% by ECHO 2012  . Altered mental state     Recurrent episodes, with confusion variously related to narcotic use, infection, etc. and has required hospitilization, nursing home stays for rehab related to weakness, encephalopathy, etc.  . History of sepsis 2012    With E.Coli bacteremia  . E coli bacteremia 2012    History of sepsis & E.Coli bacteremia  . Secondary hyperparathyroidism     Dr. Caryn Section had a sestamibi scan of her neck in May 2014 which was negative for parathyroid activity in her neck.  . Chronic kidney disease (CKD), stage III (moderate)     [name] 09/15/2013  . Hydronephrosis     She has chronic mild left   . Single kidney     post right nephrectomy  . Renal insufficiency     Past  Surgical History  Procedure Laterality Date  . Hip arthroplasty      left; S/P fracture  . Rod left leg  2012    left hip "to almost my knee; for my hip"  . Creation / revision of ileostomy / jejunostomy  1973  . Nephrectomy  1973    right  . Fracture surgery    . Partial colectomy  1970  . Tonsillectomy and adenoidectomy      "when I was a kid"  . Cholecystectomy  2000  . Appendectomy  2000  . Abdominal hysterectomy    . Dilation and curettage of uterus    . Cesarean section  1969    only 4th of 4 children was c-section  . Av fistula placement  1970's    LFA  . Bladder removal  1974    with ileal conduit and urostomy    Family History  Problem Relation Age of Onset  . Heart attack Mother   . Healthy Daughter   . Healthy Daughter   . Healthy Son     Social History Ms. Sproule reports that she quit smoking about 9 years ago. Her smoking use included Cigarettes. She smoked 0.00 packs per day for 2 years. She has never used smokeless tobacco. Ms. Davidson reports that she does not drink alcohol.  Review of Systems CONSTITUTIONAL: No weight loss, fever, chills, weakness or fatigue.  HEENT: Eyes: No visual loss, blurred vision, double vision or yellow sclerae. No hearing loss, sneezing, congestion, runny nose or sore throat.  SKIN: No rash or itching.  CARDIOVASCULAR: per HPI RESPIRATORY: No shortness of breath, cough or sputum.  GASTROINTESTINAL: No anorexia, nausea, vomiting or diarrhea. No abdominal pain or blood.  GENITOURINARY: no polyuria, no dysuria NEUROLOGICAL:  Tremor, mild dementia  MUSCULOSKELETAL: No muscle, back pain, joint pain or stiffness.  HEMATOLOGIC: No anemia, bleeding or bruising.  LYMPHATICS: No enlarged nodes. No history of splenectomy.  PSYCHIATRIC: No history of depression or anxiety.      Physical Examination Blood pressure 101/73, pulse 57, temperature 98 F (36.7 C), temperature source Oral, resp. rate 18, height 5' 5.5" (1.664 m),  weight 120 lb 8 oz (54.658 kg), SpO2 99.00%.  Intake/Output Summary (Last 24 hours) at 02/05/14 1155 Last data filed at 02/05/14 0930  Gross per 24 hour  Intake 3366.25 ml  Output   2700 ml  Net 666.25 ml    HEENT: Sclera clear  Cardiovascular: irreg, 3/6 systolic  murmur at apex, no JVD  Respiratory: clear anteriorally  GI: abdomen soft, NT, ND  MSK: no LE edema  Neuro: no focal deficits  Psych: appropriate affect   Lab Results  Basic Metabolic Panel:  Recent Labs Lab 02/01/14 2215 02/02/14 0619 02/03/14 0551 02/04/14 0638  NA 134* 139 142 143  K 3.7 3.5* 3.7 4.1  CL 95* 100 105 107  CO2 24 26 26 25   GLUCOSE 121* 100* 99 90  BUN 51* 49* 45* 41*  CREATININE 2.19* 2.09* 1.87* 1.68*  CALCIUM 9.1 8.4 8.4 8.7    Liver Function Tests:  Recent Labs Lab 02/01/14 2215  AST 15  ALT 14  ALKPHOS 111  BILITOT 0.3  PROT 6.9  ALBUMIN 3.1*    CBC:  Recent Labs Lab 02/01/14 2215 02/03/14 0551 02/04/14 0638  WBC 14.0* 11.4* 7.3  NEUTROABS 11.1*  --   --   HGB 12.0 11.5* 11.4*  HCT 36.0 34.8* 34.8*  MCV 96.5 96.9 97.5  PLT 137* 158 181    Cardiac Enzymes:  Recent Labs Lab 02/01/14 2215  TROPONINI <0.30    BNP: No components found with this basename: POCBNP,    ECG   Imaging Jan 2012 Echo Study Conclusions  - Left ventricle: Systolic function was severely reduced. The   estimated ejection fraction was in the range of 25% to 30%.   Akinesis of the mid-distal anteroseptal myocardium. - Mitral valve: Moderate regurgitation. - Tricuspid valve: Severe regurgitation. - Pulmonary arteries: Systolic pressure was moderately increased. PA   peak pressure: 58mm Hg (S).   Impression/Recommendations  1. Paroxysmal Afib - unclear how long she has been having episodes, and if could be related to current UTI - tele shows bursts of afib to 140s, followed by sinus pause 1 to 1.5 seconds, often with junctional or sinus bradycardia initially following  (tachy/brady syndrome). She reports some SOB and palpitations at times, no recent lightheadedness or dizziness.  - CHADS2Vasc score is 4 (CHF, HTN, age, gender), recommendation is anticoagulation. Discussed in detail with family and they are in favor. Will start heparin gtt now in case she needs to be considered for pacemaker in the near future, long term would treat with eliquis 2.5mg  bid (dosing based on renal function and body weight) - start low dose oral dilt to see if can better control afib episodes, without significant increase in lower rates.  - f/u TSH and echo - pending response to dilt, she may need consideration for a pacemaker. I would monitor rates overnight.If not controlled would consider transfer to [facility] over the weekend for EP evaluation and potential need for pacemaker. - some increased SOB, will give dose of IV lasix 20mg  x1, may have developed some fluid retention due to fast heart rates. F/u echo.      Dina RichJonathan Neva Ramaswamy, M.D., F.A.C.C.

## 2014-02-05 NOTE — Plan of Care (Signed)
Problem: Phase III Progression Outcomes Goal: Afebrile, Vital Signs remain stable Outcome: Progressing Patient converted into a.fib with RVR this AM - cardio consulted.  Working towards controlling HR with new orders.

## 2014-02-05 NOTE — Progress Notes (Signed)
Occupational Therapy Treatment Patient Details Name: [name] MRN: [id number] DOB: 05/19/1941 Today's Date: 02/05/2014    History of present illness [name] is a 73 y.o. 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 Patient has a history of significant anxiety and depression, and last time she was hospitalized in March of 2015 with worsening of her psychiatric symptoms shortly after her husband passed away. Her daughters tell me that patient's sister died today from brain cancer. Over the last couple weeks, patient has been very withdrawn and with flat affect. Her daughter also tells me that today she seemed to be weaker and has noticed patient to be lethargic and with saliva drooping out of her mouth. She was also concerned that she has been weaker and she thinks patient's right eye is "different" as well as the corner of her mouth on right is "different" and was also concerned for a stroke. Patient is very flat in the ED, the only thing that she endorses is feeling "sad" and having worse back pain. Family associates her back pain with a UTI which she has had in the past, however never being this lethargic. In the ED, labs show acute on chronic renal failure and mild leukocytosis of 14.    OT comments  Pt is demonstrating good skills in acute OT - she is overall at min/guard supervision level with ADL needs.  Pt asked many questions suggestion of early Parkinson's diagnosis - encouraged pt to ask any questions she has when they arise.  D/c plan remains appropriate.  Follow Up Recommendations       Equipment Recommendations       Recommendations for Other Services      Precautions / Restrictions Precautions Precautions: Fall Restrictions Weight Bearing Restrictions: No       Mobility Bed Mobility Overal bed mobility: Modified Independent                Transfers Overall transfer level: Needs assistance Equipment used: None Transfers: Sit to/from Stand Sit to Stand: Min guard              Balance                                   ADL Overall ADL's : Needs assistance/impaired Eating/Feeding: Supervision/ safety   Grooming: Supervision/safety;Standing;Wash/dry hands;Wash/dry face;Oral care;Applying deodorant;Brushing hair;Min guard Grooming Details (indicate cue type and reason): pta ble to stand at sink fo 20 minutes with no LOB. min guard for standing.  Upper Body Bathing: Supervision/ safety           Lower Body Dressing: Supervision/safety Lower Body Dressing Details (indicate cue type and reason): donning/doffing socks Toilet Transfer: Min guard           Functional mobility during ADLs: Min guard        Vision                     Perception     Praxis      Cognition   Behavior During Therapy: WFL for tasks assessed/performed Overall Cognitive Status: Within Functional Limits for tasks assessed  Extremity/Trunk Assessment               Exercises     Shoulder Instructions       General Comments      Pertinent Vitals/ Pain       Pain Assessment: 0-10 Pain Score: 7  Pain Location: Back Pain Intervention(s): Monitored during session;Repositioned;Relaxation  Home Living                                          Prior Functioning/Environment              Frequency       Progress Toward Goals  OT Goals(current goals can now be found in the care plan section)  Progress towards OT goals: Progressing toward goals  Acute Rehab OT Goals Patient Stated Goal: go home OT Goal Formulation: With patient/family Time  For Goal Achievement: 02/17/14 Potential to Achieve Goals: Good  Plan      Co-evaluation                 End of Session Equipment Utilized During Treatment: Gait belt   Activity Tolerance Patient tolerated treatment well   Patient Left in bed;with call bell/phone within reach;with bed alarm set;with nursing/sitter in room   Nurse Communication          Time: 1610-9604 OT Time Calculation (min): 36 min  Charges: OT General Charges $OT Visit: 1 Procedure OT Treatments $Self Care/Home Management : 23-37 mins  Marry Guan, MS, OTR/L [phone]  02/05/2014, 9:59 AM

## 2014-02-05 NOTE — Progress Notes (Signed)
ANTICOAGULATION CONSULT NOTE - Initial Consult  Pharmacy Consult for Heparin Indication: atrial fibrillation  Allergies  Allergen Reactions  . Demerol [Meperidine] Other (See Comments)    Reaction is unknown  . Ketorolac Tromethamine Rash   Patient Measurements: Height: 5' 5.5" (166.4 cm) Weight: 120 lb 8 oz (54.658 kg) IBW/kg (Calculated) : 58.15  Vital Signs: Temp: 98.4 F (36.9 C) (08/14 1319) Temp src: Oral (08/14 1319) BP: 111/70 mmHg (08/14 1319) Pulse Rate: 74 (08/14 1319)  Labs:  Recent Labs  02/03/14 0551 02/04/14 0638  HGB 11.5* 11.4*  HCT 34.8* 34.8*  PLT 158 181  CREATININE 1.87* 1.68*   Estimated Creatinine Clearance: 25.8 ml/min (by C-G formula based on Cr of 1.68).  Medical History: Past Medical History  Diagnosis Date  . Gastroesophageal reflux   . Chronic UTI   . AV fistula     clotted in her left forearm  . Cellulitis   . Pneumothorax     secondary to central line placement  . Myocardial infarction 2011; 2012  . Numbness and tingling of both legs   . Anginal pain   . Heart murmur   . Dysrhythmia     "heart beats real fast"  . Chronic anemia   . Pneumonia     "one time"  . Chronic chest pain 01/14/12    1-2 X/day/H&P  . Arthritis     "at different places"  . Anxiety   . Depression   . Headache(784.0)     "alot; not daily"  . Frequent falls   . Dementia   . Collagen vascular disease   . S/P ileal conduit     post cystectomy/ileal conduit  . Anemia     Received [facility]heme (x) 2 at [facility]  . Iron deficiency     Received [facility] 2 at [facility]  . Dysarthria 03/31/13    Speech is almost [name] little bit dysarthritic  . Unsteady     Stronger since she came out of nursing home & is walking with [name] cane  . Edema 03/31/13    Gained 9 pounds overnight & telephone nurse instructed her to take 20mg  of Lasix bid on 03/30/13. Still has swelling in legs.  . Metabolic acidosis     Chronic  . Hypertension     Good control  . CHF  (congestive heart failure)     Systolic heart failure EF 25-30% by ECHO 2012  . Altered mental state     Recurrent episodes, with confusion variously related to narcotic use, infection, etc. and has required hospitilization, nursing home stays for rehab related to weakness, encephalopathy, etc.  . History of sepsis 2012    With E.Coli bacteremia  . E coli bacteremia 2012    History of sepsis & E.Coli bacteremia  . Secondary hyperparathyroidism     Dr. Caryn Section had [name] sestamibi scan of her neck in May 2014 which was negative for parathyroid activity in her neck.  . Chronic kidney disease (CKD), stage III (moderate)     [name] 09/15/2013  . Hydronephrosis     She has chronic mild left   . Single kidney     post right nephrectomy  . Renal insufficiency    Medications:  Infusions:  . heparin      Assessment: 72yo female with PAF.  Asked to initiate IV Heparin.  Pt is elderly and small.    Goal of Therapy:  Heparin level 0.3-0.7 units/ml Monitor platelets by anticoagulation protocol: Yes  Plan:  Heparin bolus 2000 units IV now x 1 Heparin infusion at 800 units/hr Check Heparin level in 6-8 hours then daily Monitor CBC daily while on heparin  [name], [name] 02/05/2014,1:26 PM

## 2014-02-05 NOTE — Progress Notes (Signed)
EEG Completed; Results Pending  

## 2014-02-06 DIAGNOSIS — I369 Nonrheumatic tricuspid valve disorder, unspecified: Secondary | ICD-10-CM

## 2014-02-06 LAB — BASIC METABOLIC PANEL
ANION GAP: 12 (ref 5–15)
BUN: 39 mg/dL — ABNORMAL HIGH (ref 6–23)
CALCIUM: 8.8 mg/dL (ref 8.4–10.5)
CO2: 27 mEq/L (ref 19–32)
Chloride: 101 mEq/L (ref 96–112)
Creatinine, Ser: 1.84 mg/dL — ABNORMAL HIGH (ref 0.50–1.10)
GFR, EST AFRICAN AMERICAN: 30 mL/min — AB (ref >90)
GFR, EST NON AFRICAN AMERICAN: 26 mL/min — AB (ref >90)
GLUCOSE: 86 mg/dL (ref 70–99)
POTASSIUM: 3.9 meq/L (ref 3.7–5.3)
SODIUM: 140 meq/L (ref 137–147)

## 2014-02-06 LAB — TROPONIN I: Troponin I: <0.3 ng/mL (ref <0.30)

## 2014-02-06 LAB — CBC
HCT: 33.6 % — ABNORMAL LOW (ref 36.0–46.0)
HEMOGLOBIN: 10.9 g/dL — AB (ref 12.0–15.0)
MCH: 32 pg (ref 26.0–34.0)
MCHC: 32.4 g/dL (ref 30.0–36.0)
MCV: 98.5 fL (ref 78.0–100.0)
PLATELETS: 217 10*3/uL (ref 150–400)
RBC: 3.41 MIL/uL — ABNORMAL LOW (ref 3.87–5.11)
RDW: 14.7 % (ref 11.5–15.5)
WBC: 7.4 10*3/uL (ref 4.0–10.5)

## 2014-02-06 LAB — MAGNESIUM: MAGNESIUM: 1.8 mg/dL (ref 1.5–2.5)

## 2014-02-06 LAB — HEPARIN LEVEL (UNFRACTIONATED): HEPARIN UNFRACTIONATED: 0.43 [IU]/mL (ref 0.30–0.70)

## 2014-02-06 MED ORDER — CEFTRIAXONE SODIUM 1 G IJ SOLR
1.0000 g | INTRAMUSCULAR | Status: DC
  Filled 2014-02-06 (×3): qty 10

## 2014-02-06 MED ORDER — CARVEDILOL 3.125 MG PO TABS
6.2500 mg | ORAL_TABLET | Freq: Two times a day (BID) | ORAL | Status: DC
  Administered 2014-02-06 – 2014-02-08 (×3): 6.25 mg via ORAL
  Filled 2014-02-06 (×3): qty 2

## 2014-02-06 MED ORDER — CEFUROXIME AXETIL 250 MG PO TABS
250.0000 mg | ORAL_TABLET | Freq: Two times a day (BID) | ORAL | Status: DC
  Administered 2014-02-06 – 2014-02-08 (×5): 250 mg via ORAL
  Filled 2014-02-06 (×5): qty 1

## 2014-02-06 NOTE — Progress Notes (Signed)
  Echocardiogram 2D Echocardiogram has been performed.  Stacey DrainWhite, Deoni Cosey J 02/06/2014, 10:59 AM

## 2014-02-06 NOTE — Progress Notes (Addendum)
[facility] PROGRESS NOTE  [name] WUJ:[id number]:[id number] DOB: 07/31/1940 DOA: 02/01/2014 PCP: Toma DeitersHASANAJ,XAJE A, MD  Assessment/Plan:  1.  Atrial Fibrillation with rapid ventricular response -Patient going into Afib with RVR yesterday, cardiology consulted, started on cardizem 30 mg PO q 6 hours. She converted to NSR at 2 pm yesterday, remaining in sinus rhythm over night. Ventricular rates in the 50's for which Cardizem was held.  -Afib may have been precipitated by underlying infectious process -Last 2D Echo done on 10/30/2013 showed EF of 55-60% -Pending echo, TSH within normal limits -Cardiology following   1. Acute encephalopathy -Likely multifactorial with possible urinary tract infection, acute on chronic renal failure contributors, although I think that major depression may be a significant component of this. She is awake, alert and oriented x3, has a flat affect. She has several losses, with her husband dying of cancer several months ago and her sister passing away 2 days ago. Family members reporting that she had a significant decline after the recent death of her sister. Behavioral health consulted however, she did declined.   2.  Parkinsons -Patient having resting tremors, with possible associated rigidity to upper extremities and cogwheeling. She is not on antipsychotic medications which would have the potential for extrapyramidal symptoms.  -Neurology consulted, she was started on carbidopa/levadopa -Continues to have some rigidity on exam  3. Acute on chronic kidney injury. -Given patient's likely underlying depression it is possible that she has had minimal by mouth intake and kidney failure related to prerenal azotemia -She received a dose of IV lasix yesterday, am labs showing an upward trend in creatinine to 1.84 from 1.62  4. Possible urinary tract infection -Lab showing an elevated white count of 14,000, patient have a low-grade fever of 100.4. -Has history of  ileal conduit, make a urine analysis difficult to interpret -Will transition to oral Ceftin 250 mg PO BID, stop rocephin  5. Acute on chronic systolic congestive heart failure -Pending 2D Echo, Afib with RVR likely precipitating heart failure. Labs showed a BNP of 2,256 -She was given dose of lasix yesterday, having a urine output of 2.1 liters.    Code Status: Full Code Family Communication: Spoke with her daughter present at bedside Disposition Plan: Anticipate discharge to home when medically stable   Consultants: Neurology Cardiology   Antibiotics:  Rocephin 1 g IV q 24 hours  HPI/Subjective: She reports feeling better this morning, is awake and alert. Remained in sinus rhythm overnight   Objective: Filed Vitals:   02/06/14 0859  BP: 112/53  Pulse: 60  Temp: 97.8 F (36.6 C)  Resp: 18    Intake/Output Summary (Last 24 hours) at 02/06/14 0934 Last data filed at 02/06/14 0455  Gross per 24 hour  Intake    480 ml  Output   2850 ml  Net  -2370 ml   Filed Weights   02/01/14 2014 02/04/14 0408  Weight: 49.896 kg (110 lb) 54.658 kg (120 lb 8 oz)    Exam:   General:  Flat affect, she is awake and alert oriented x3  Cardiovascular: Irregular rate and rhythm, normal S1S2, tachycardic  Respiratory: Clear to auscultation bilaterally, normal respiratory effort  Abdomen: Soft nontender nondistended  Musculoskeletal: No edema  Neurological: There is some rigidity in bilateral upper extremities, +cogwheeling, no shuffling gait.   Data Reviewed: Basic Metabolic Panel:  Recent Labs Lab 02/02/14 0619 02/03/14 0551 02/04/14 0638 02/05/14 1200 02/06/14 0512  NA 139 142 143 140 140  K 3.5* 3.7 4.1  3.7 3.9  CL 100 105 107 103 101  CO2 26 26 25 25 27   GLUCOSE 100* 99 90 103* 86  BUN 49* 45* 41* 36* 39*  CREATININE 2.09* 1.87* 1.68* 1.62* 1.84*  CALCIUM 8.4 8.4 8.7 9.2 8.8  MG  --   --   --   --  1.8   Liver Function Tests:  Recent Labs Lab  02/01/14 2215  AST 15  ALT 14  ALKPHOS 111  BILITOT 0.3  PROT 6.9  ALBUMIN 3.1*    Recent Labs Lab 02/01/14 2215  LIPASE 42   No results found for this basename: AMMONIA,  in the last 168 hours CBC:  Recent Labs Lab 02/01/14 2215 02/03/14 0551 02/04/14 0638 02/05/14 1200 02/06/14 0512  WBC 14.0* 11.4* 7.3 7.5 7.4  NEUTROABS 11.1*  --   --   --   --   HGB 12.0 11.5* 11.4* 11.3* 10.9*  HCT 36.0 34.8* 34.8* 34.7* 33.6*  MCV 96.5 96.9 97.5 97.5 98.5  PLT 137* 158 181 213 217   Cardiac Enzymes:  Recent Labs Lab 02/01/14 2215 02/05/14 1125 02/05/14 2054 02/06/14 0512  TROPONINI <0.30 <0.30 <0.30 <0.30   BNP (last 3 results)  Recent Labs  02/01/14 2215 02/05/14 1125  PROBNP 314.7* 2256.0*   CBG:  Recent Labs Lab 02/05/14 0732  GLUCAP 92    Recent Results (from the past 240 hour(s))  URINE CULTURE     Status: None   Collection Time    02/01/14 11:35 PM      Result Value Ref Range Status   Specimen Description URINE, CLEAN CATCH   Final   Special Requests NONE   Final   Culture  Setup Time     Final   Value: 02/02/2014 13:10     Performed at [facility] Count     Final   Value: >=100,000 COLONIES/ML     Performed at [facility]   Culture     Final   Value: Multiple bacterial morphotypes present, none predominant. Suggest appropriate recollection if clinically indicated.     Performed at [facility]   Report Status 02/03/2014 FINAL   Final     Studies: Dg Chest 2 View  02/05/2014   CLINICAL DATA:  Chest pain.  Short of breath.  EXAM: CHEST  2 VIEW  COMPARISON:  02/01/2014.  09/15/2013.  FINDINGS: Artifact overlies the chest. Heart size is normal. There is calcification of the thoracic aorta there are new small effusions with mild patchy density at the lung bases. This could be atelectasis or mild basilar pneumonia. Upper lungs remain clear.  IMPRESSION: New bilateral small effusions and patchy basilar lung  density. This could be atelectasis or mild basilar pneumonia.   Electronically Signed   By: Paulina Fusi M.D.   On: 02/05/2014 13:50   Dg Shoulder Right  02/04/2014   CLINICAL DATA:  Right shoulder pain no injury.  EXAM: RIGHT SHOULDER - 2+ VIEW  COMPARISON:  Right shoulder radiographs 02/28/2006.  FINDINGS: Normal anatomic alignment. No evidence for acute fracture or dislocation. Regional soft tissues are unremarkable. Visualized right hemithorax is unremarkable.  IMPRESSION: No acute osseous abnormality involving the right shoulder.  No significant osseous degenerative change.   Electronically Signed   By: Annia Belt M.D.   On: 02/04/2014 18:24    Scheduled Meds: . ALPRAZolam  0.5 mg Oral TID  . antiseptic oral rinse  7 mL Mouth Rinse BID  .  aspirin EC  81 mg Oral Daily  . calcitRIOL  0.5 mcg Oral Daily  . calcium-vitamin D  1 tablet Oral Q breakfast  . carbidopa-levodopa  1 tablet Oral BID  . [START ON 02/11/2014] carbidopa-levodopa  1 tablet Oral TID  . carvedilol  6.25 mg Oral BID WC  . cefUROXime  250 mg Oral Q12H  . citalopram  20 mg Oral Daily  . diltiazem  30 mg Oral 3 times per day  . donepezil  10 mg Oral QPM  . feeding supplement (RESOURCE BREEZE)  1 Container Oral TID BM  . ipratropium-albuterol  3 mL Nebulization QID  . mirtazapine  15 mg Oral QHS  . mometasone-formoterol  2 puff Inhalation BID  . sodium bicarbonate  650 mg Oral TID   Continuous Infusions: . heparin 800 Units/hr (02/05/14 1433)    Active Problems:   Weakness   Depression   Hypertension   Chronic kidney disease   CHF (congestive heart failure)   Dementia   Renal failure (ARF), acute on chronic   UTI (lower urinary tract infection)   Abdominal pain, chronic, left lower quadrant   Acute encephalopathy   Dehydration   CKD (chronic kidney disease) stage 3, GFR 30-59 ml/min    Time spent: 25 min    Jeralyn Bennett  [facility] Pager [phone]. If 7PM-7AM, please contact night-coverage  at [email or web address], password [facility] 02/06/2014, 9:34 AM  LOS: 5 days

## 2014-02-06 NOTE — Progress Notes (Signed)
ANTICOAGULATION CONSULT NOTE - follow up  Pharmacy Consult for Heparin Indication: atrial fibrillation  Allergies  Allergen Reactions  . Demerol [Meperidine] Other (See Comments)    Reaction is unknown  . Ketorolac Tromethamine Rash   Patient Measurements: Height: 5' 5.5" (166.4 cm) Weight: 120 lb 8 oz (54.658 kg) IBW/kg (Calculated) : 58.15  Vital Signs: Temp: 98.4 F (36.9 C) (08/15 0451) Temp src: Oral (08/15 0451) BP: 110/49 mmHg (08/15 0451) Pulse Rate: 54 (08/15 0451)  Labs:  Recent Labs  02/04/14 0638 02/05/14 1125 02/05/14 1200 02/05/14 2054 02/06/14 0512  HGB 11.4*  --  11.3*  --  10.9*  HCT 34.8*  --  34.7*  --  33.6*  PLT 181  --  213  --  217  HEPARINUNFRC  --   --   --  0.43 0.43  CREATININE 1.68*  --  1.62*  --  1.84*  TROPONINI  --  <0.30  --  <0.30 <0.30   Estimated Creatinine Clearance: 23.5 ml/min (by C-G formula based on Cr of 1.84).  Medical History: Past Medical History  Diagnosis Date  . Gastroesophageal reflux   . Chronic UTI   . AV fistula     clotted in her left forearm  . Cellulitis   . Pneumothorax     secondary to central line placement  . Myocardial infarction 2011; 2012  . Numbness and tingling of both legs   . Anginal pain   . Heart murmur   . Dysrhythmia     "heart beats real fast"  . Chronic anemia   . Pneumonia     "one time"  . Chronic chest pain 01/14/12    1-2 X/day/H&P  . Arthritis     "at different places"  . Anxiety   . Depression   . Headache(784.0)     "alot; not daily"  . Frequent falls   . Dementia   . Collagen vascular disease   . S/P ileal conduit     post cystectomy/ileal conduit  . Anemia     Received [facility] Feraheme (x) 2 at [facility]  . Iron deficiency     Received IV Feraheme (x) 2 at [facility].  . Dysarthria 03/31/13    Speech is almost a little bit dysarthritic  . Unsteady     Stronger since she came out of nursing home & is walking with a cane  . Edema 03/31/13    Gained 9 pounds  overnight & telephone nurse instructed her to take 20mg  of Lasix bid on 03/30/13. Still has swelling in legs.  . Metabolic acidosis     Chronic  . Hypertension     Good control  . CHF (congestive heart failure)     Systolic heart failure EF 25-30% by ECHO 2012  . Altered mental state     Recurrent episodes, with confusion variously related to narcotic use, infection, etc. and has required hospitilization, nursing home stays for rehab related to weakness, encephalopathy, etc.  . History of sepsis 2012    With E.Coli bacteremia  . E coli bacteremia 2012    History of sepsis & E.Coli bacteremia  . Secondary hyperparathyroidism     Dr. Caryn Section had a sestamibi scan of her neck in May 2014 which was negative for parathyroid activity in her neck.  . Chronic kidney disease (CKD), stage III (moderate)     [name] 09/15/2013  . Hydronephrosis     She has chronic mild left   . Single kidney  post right nephrectomy  . Renal insufficiency    Medications:  Infusions:  . heparin 800 Units/hr (02/05/14 1433)   Assessment: 72yo female with PAF.  Asked to initiate IV Heparin.  Pt is elderly and small.  Heparin level is therapeutic x 2.    Goal of Therapy:  Heparin level 0.3-0.7 units/ml Monitor platelets by anticoagulation protocol: Yes   Plan:  Continue Heparin infusion at 800 units/hr Check Heparin level daily Monitor CBC daily while on heparin  Margo AyeHall, Jaxan Michel A 02/06/2014,7:55 AM

## 2014-02-07 DIAGNOSIS — G92 Toxic encephalopathy: Secondary | ICD-10-CM

## 2014-02-07 DIAGNOSIS — G929 Unspecified toxic encephalopathy: Secondary | ICD-10-CM

## 2014-02-07 LAB — BASIC METABOLIC PANEL
ANION GAP: 9 (ref 5–15)
BUN: 44 mg/dL — ABNORMAL HIGH (ref 6–23)
CO2: 26 mEq/L (ref 19–32)
Calcium: 9.3 mg/dL (ref 8.4–10.5)
Chloride: 100 mEq/L (ref 96–112)
Creatinine, Ser: 1.78 mg/dL — ABNORMAL HIGH (ref 0.50–1.10)
GFR, EST AFRICAN AMERICAN: 31 mL/min — AB (ref >90)
GFR, EST NON AFRICAN AMERICAN: 27 mL/min — AB (ref >90)
Glucose, Bld: 92 mg/dL (ref 70–99)
POTASSIUM: 4.5 meq/L (ref 3.7–5.3)
SODIUM: 135 meq/L — AB (ref 137–147)

## 2014-02-07 LAB — CBC
HCT: 34.2 % — ABNORMAL LOW (ref 36.0–46.0)
Hemoglobin: 10.8 g/dL — ABNORMAL LOW (ref 12.0–15.0)
MCH: 31.6 pg (ref 26.0–34.0)
MCHC: 31.6 g/dL (ref 30.0–36.0)
MCV: 100 fL (ref 78.0–100.0)
PLATELETS: 205 10*3/uL (ref 150–400)
RBC: 3.42 MIL/uL — AB (ref 3.87–5.11)
RDW: 14.9 % (ref 11.5–15.5)
WBC: 7.7 10*3/uL (ref 4.0–10.5)

## 2014-02-07 LAB — HEPARIN LEVEL (UNFRACTIONATED): Heparin Unfractionated: 0.22 IU/mL — ABNORMAL LOW (ref 0.30–0.70)

## 2014-02-07 MED ORDER — BISACODYL 10 MG RE SUPP
10.0000 mg | Freq: Once | RECTAL | Status: AC
  Administered 2014-02-07: 10 mg via RECTAL
  Filled 2014-02-07: qty 1

## 2014-02-07 MED ORDER — ONDANSETRON HCL 4 MG/2ML IJ SOLN
4.0000 mg | Freq: Four times a day (QID) | INTRAMUSCULAR | Status: DC | PRN
  Administered 2014-02-07: 4 mg via INTRAVENOUS
  Filled 2014-02-07: qty 2

## 2014-02-07 MED ORDER — DILTIAZEM HCL ER COATED BEADS 120 MG PO CP24
120.0000 mg | ORAL_CAPSULE | Freq: Every day | ORAL | Status: DC
  Administered 2014-02-07 – 2014-02-08 (×2): 120 mg via ORAL
  Filled 2014-02-07 (×2): qty 1

## 2014-02-07 MED ORDER — LACTULOSE 10 GM/15ML PO SOLN
20.0000 g | Freq: Once | ORAL | Status: AC
  Administered 2014-02-07: 20 g via ORAL
  Filled 2014-02-07: qty 30

## 2014-02-07 MED ORDER — APIXABAN 5 MG PO TABS
2.5000 mg | ORAL_TABLET | Freq: Two times a day (BID) | ORAL | Status: DC
  Administered 2014-02-07 – 2014-02-08 (×3): 2.5 mg via ORAL
  Filled 2014-02-07 (×3): qty 1

## 2014-02-07 NOTE — Progress Notes (Addendum)
Patient having some tremors and pain in R arm  - MD aware and PRN pains are being given as ordered.  Patient also sweating, linen slightly damp with patient feeling clammy.  Patient able to walk in halls with assistance and appears to tolerate well.  HR increase to 69 from 58 with ambulation.  Patient and family concerned over patients symptoms of shaking and sweating.  Patients daughter also reports seeing the patients foot "turn white."  Upon assessment, neuro and neurovascular systems are WNL other than shaking (pt has parkinsons).  Patient does feel clammy to touch.  Called MD and discussed with him.  No reason to explain sweating, all lab work and VS stable and WNL.  Thoughts that may be stress/anxiety related d/t patients recent loss of husband and sister.  Xanax given as ordered.  Spoke with patient shortly about husband, patient smiling and not tearful with this conversation.  Will continue to monitor patient.  Right now patient sitting up in bed drink Dr Reino KentPepper, waiting on supper to arrive.  NAD noted.

## 2014-02-07 NOTE — Progress Notes (Addendum)
[facility] PROGRESS NOTE  [name] WUJ:[id number]:[id number] DOB: 11/05/1940 DOA: 02/01/2014 PCP: Toma DeitersHASANAJ,XAJE A, MD  Assessment/Plan:  1.  Atrial Fibrillation with rapid ventricular response -Patient going into Afib with RVR on 02/05/2014, cardiology consulted, started on cardizem 30 mg PO q 6 hours.  -Afib may have been precipitated by underlying infectious process -Transthoracic echocardiogram performed on 02/06/2014 showing ejection fraction of 65-70% without wall motion abnormalities -TSH within normal limits -Patient remaining in sinus rhythm now for greater than 24 hours -Will discontinue IV heparin, start Eliquis 2.5 mg PO BID -Change to extend release Cardizem at 120 mg by mouth daily  1. Acute encephalopathy -Likely multifactorial with possible urinary tract infection, acute on chronic renal failure contributors, although I think that major depression may be a significant component of this. She is awake, alert and oriented x3, has a flat affect. She has several losses, with her husband dying of cancer several months ago and her sister passing away 2 days ago. Family members reporting that she had a significant decline after the recent death of her sister. Behavioral health consulted however, she did declined.   2.  Parkinsons -Patient having resting tremors, with possible associated rigidity to upper extremities and cogwheeling. She is not on antipsychotic medications which would have the potential for extrapyramidal symptoms.  -Neurology consulted, she was started on carbidopa/levadopa -Continues to have some rigidity on exam  3. Acute on chronic kidney injury. -Given patient's likely underlying depression it is possible that she has had minimal by mouth intake and kidney failure related to prerenal azotemia -Kidney function remains stable on a.m. lab work -Has history of stage III chronic kidney disease with baseline creatinine near 1.5-1.7  4. Possible urinary tract  infection -Lab showing an elevated white count of 14,000, patient have a low-grade fever of 100.4. -Has history of ileal conduit, make a urine analysis difficult to interpret -Transitioned to oral Ceftin 250 mg PO BID,  as Rocephin was discontinued on 02/06/2014   5. Acute on chronic systolic congestive heart failure -Pending 2D Echo, Afib with RVR likely precipitating heart failure. Labs showed a BNP of 2,256 -She was given dose of lasix on 02/05/2014, having a good urine output   6. Protein Calorie Malnutrition -Patient having a weight of 54 Kg, albumin 3.1 -Will check a prealbumin level -Continue protein boost TID   Code Status: Full Code Family Communication: Spoke with her daughter present at bedside Disposition Plan: Anticipate discharge to home when medically stable   Consultants: Neurology Cardiology   Antibiotics:  Rocephin 1 g IV q 24 hours  HPI/Subjective: She reports feeling better today, denies chest pain, shortness of breath, palpitations  Objective: Filed Vitals:   02/07/14 0921  BP:   Pulse: 62  Temp:   Resp:     Intake/Output Summary (Last 24 hours) at 02/07/14 1038 Last data filed at 02/07/14 [id number]  Gross per 24 hour  Intake    576 ml  Output   3350 ml  Net  -2774 ml   Filed Weights   02/01/14 2014 02/04/14 0408  Weight: 49.896 kg (110 lb) 54.658 kg (120 lb 8 oz)    Exam:   General:  Flat affect, she is awake and alert oriented x3  Cardiovascular: Regular rate and rhythm, normal S1S2  Respiratory: Clear to auscultation bilaterally, normal respiratory effort  Abdomen: Soft nontender nondistended  Musculoskeletal: No edema  Neurological: There is some rigidity in bilateral upper extremities, +cogwheeling, no shuffling gait.   Data Reviewed: Basic  Metabolic Panel:  Recent Labs Lab 02/03/14 0551 02/04/14 0638 02/05/14 1200 02/06/14 0512 02/07/14 0522  NA 142 143 140 140 135*  K 3.7 4.1 3.7 3.9 4.5  CL 105 107 103 101 100   CO2 26 25 25 27 26   GLUCOSE 99 90 103* 86 92  BUN 45* 41* 36* 39* 44*  CREATININE 1.87* 1.68* 1.62* 1.84* 1.78*  CALCIUM 8.4 8.7 9.2 8.8 9.3  MG  --   --   --  1.8  --    Liver Function Tests:  Recent Labs Lab 02/01/14 2215  AST 15  ALT 14  ALKPHOS 111  BILITOT 0.3  PROT 6.9  ALBUMIN 3.1*    Recent Labs Lab 02/01/14 2215  LIPASE 42   No results found for this basename: AMMONIA,  in the last 168 hours CBC:  Recent Labs Lab 02/01/14 2215 02/03/14 0551 02/04/14 0638 02/05/14 1200 02/06/14 0512 02/07/14 0522  WBC 14.0* 11.4* 7.3 7.5 7.4 7.7  NEUTROABS 11.1*  --   --   --   --   --   HGB 12.0 11.5* 11.4* 11.3* 10.9* 10.8*  HCT 36.0 34.8* 34.8* 34.7* 33.6* 34.2*  MCV 96.5 96.9 97.5 97.5 98.5 100.0  PLT 137* 158 181 213 217 205   Cardiac Enzymes:  Recent Labs Lab 02/01/14 2215 02/05/14 1125 02/05/14 2054 02/06/14 0512  TROPONINI <0.30 <0.30 <0.30 <0.30   BNP (last 3 results)  Recent Labs  02/01/14 2215 02/05/14 1125  PROBNP 314.7* 2256.0*   CBG:  Recent Labs Lab 02/05/14 0732  GLUCAP 92    Recent Results (from the past 240 hour(s))  URINE CULTURE     Status: None   Collection Time    02/01/14 11:35 PM      Result Value Ref Range Status   Specimen Description URINE, CLEAN CATCH   Final   Special Requests NONE   Final   Culture  Setup Time     Final   Value: 02/02/2014 13:10     Performed at [facility] Count     Final   Value: >=100,000 COLONIES/ML     Performed at [facility]   Culture     Final   Value: Multiple bacterial morphotypes present, none predominant. Suggest appropriate recollection if clinically indicated.     Performed at [facility]   Report Status 02/03/2014 FINAL   Final     Studies: Dg Chest 2 View  02/05/2014   CLINICAL DATA:  Chest pain.  Short of breath.  EXAM: CHEST  2 VIEW  COMPARISON:  02/01/2014.  09/15/2013.  FINDINGS: Artifact overlies the chest. Heart size is normal.  There is calcification of the thoracic aorta there are new small effusions with mild patchy density at the lung bases. This could be atelectasis or mild basilar pneumonia. Upper lungs remain clear.  IMPRESSION: New bilateral small effusions and patchy basilar lung density. This could be atelectasis or mild basilar pneumonia.   Electronically Signed   By: Paulina Fusi M.D.   On: 02/05/2014 13:50    Scheduled Meds: . ALPRAZolam  0.5 mg Oral TID  . antiseptic oral rinse  7 mL Mouth Rinse BID  . aspirin EC  81 mg Oral Daily  . calcitRIOL  0.5 mcg Oral Daily  . calcium-vitamin D  1 tablet Oral Q breakfast  . carbidopa-levodopa  1 tablet Oral BID  . [START ON 02/11/2014] carbidopa-levodopa  1 tablet Oral TID  . carvedilol  6.25 mg Oral BID WC  . cefUROXime  250 mg Oral Q12H  . citalopram  20 mg Oral Daily  . diltiazem  30 mg Oral 3 times per day  . donepezil  10 mg Oral QPM  . feeding supplement (RESOURCE BREEZE)  1 Container Oral TID BM  . ipratropium-albuterol  3 mL Nebulization QID  . mirtazapine  15 mg Oral QHS  . mometasone-formoterol  2 puff Inhalation BID  . sodium bicarbonate  650 mg Oral TID   Continuous Infusions: . heparin 800 Units/hr (02/06/14 1316)    Active Problems:   Weakness   Depression   Hypertension   Chronic kidney disease   CHF (congestive heart failure)   Dementia   Renal failure (ARF), acute on chronic   UTI (lower urinary tract infection)   Abdominal pain, chronic, left lower quadrant   Acute encephalopathy   Dehydration   CKD (chronic kidney disease) stage 3, GFR 30-59 ml/min    Time spent: 25 min    Jeralyn Bennett  [facility] Pager [phone]. If 7PM-7AM, please contact night-coverage at [email or web address], password [facility] 02/07/2014, 10:38 AM  LOS: 6 days

## 2014-02-07 NOTE — Progress Notes (Signed)
Pt vomited x2 . MD paged and orders given for Zofran.

## 2014-02-07 NOTE — Progress Notes (Signed)
ANTICOAGULATION CONSULT NOTE - follow up  Pharmacy Consult for Heparin Indication: atrial fibrillation  Allergies  Allergen Reactions  . Demerol [Meperidine] Other (See Comments)    Reaction is unknown  . Ketorolac Tromethamine Rash   Patient Measurements: Height: 5' 5.5" (166.4 cm) Weight: 120 lb 8 oz (54.658 kg) IBW/kg (Calculated) : 58.15  Vital Signs: Temp: 97.8 F (36.6 C) (08/16 0541) Temp src: Oral (08/16 0541) BP: 110/44 mmHg (08/16 0541) Pulse Rate: 57 (08/16 0541)  Labs:  Recent Labs  02/05/14 1125  02/05/14 1200 02/05/14 2054 02/06/14 0512 02/07/14 0522  HGB  --   < > 11.3*  --  10.9* 10.8*  HCT  --   --  34.7*  --  33.6* 34.2*  PLT  --   --  213  --  217 205  HEPARINUNFRC  --   --   --  0.43 0.43 0.22*  CREATININE  --   --  1.62*  --  1.84* 1.78*  TROPONINI <0.30  --   --  <0.30 <0.30  --   < > = values in this interval not displayed. Estimated Creatinine Clearance: 24.3 ml/min (by C-G formula based on Cr of 1.78).  Medical History: Past Medical History  Diagnosis Date  . Gastroesophageal reflux   . Chronic UTI   . AV fistula     clotted in her left forearm  . Cellulitis   . Pneumothorax     secondary to central line placement  . Myocardial infarction 2011; 2012  . Numbness and tingling of both legs   . Anginal pain   . Heart murmur   . Dysrhythmia     "heart beats real fast"  . Chronic anemia   . Pneumonia     "one time"  . Chronic chest pain 01/14/12    1-2 X/day/H&P  . Arthritis     "at different places"  . Anxiety   . Depression   . Headache(784.0)     "alot; not daily"  . Frequent falls   . Dementia   . Collagen vascular disease   . S/P ileal conduit     post cystectomy/ileal conduit  . Anemia     [facility]  . Iron deficiency     [facility]  . Dysarthria 03/31/13    Speech is almost a little bit dysarthritic  . Unsteady     Stronger since she came out of nursing  home & is walking with a cane  . Edema 03/31/13    Gained 9 pounds overnight & telephone nurse instructed her to take 20mg  of Lasix bid on 03/30/13. Still has swelling in legs.  . Metabolic acidosis     Chronic  . Hypertension     Good control  . CHF (congestive heart failure)     Systolic heart failure EF 25-30% by ECHO 2012  . Altered mental state     Recurrent episodes, with confusion variously related to narcotic use, infection, etc. and has required hospitilization, nursing home stays for rehab related to weakness, encephalopathy, etc.  . History of sepsis 2012    With E.Coli bacteremia  . E coli bacteremia 2012    History of sepsis & E.Coli bacteremia  . Secondary hyperparathyroidism     Dr. Caryn SectionFox had a sestamibi scan of her neck in May 2014 which was negative for parathyroid activity in her neck.  . Chronic kidney disease (CKD), stage III (  moderate)     [name] 09/15/2013  . Hydronephrosis     She has chronic mild left   . Single kidney     post right nephrectomy  . Renal insufficiency    Medications:  Infusions:  . heparin 800 Units/hr (02/06/14 1316)   Assessment: 72yo female with PAF.  Asked to initiate IV Heparin.  Pt is elderly and small.  Heparin level has trended down.    Goal of Therapy:  Heparin level 0.3-0.7 units/ml Monitor platelets by anticoagulation protocol: Yes   Plan:  Increase Heparin infusion to 900 units/hr Check Heparin level daily Monitor CBC daily while on heparin  Margo Aye, Shanaya Schneck A 02/07/2014,8:24 AM

## 2014-02-08 ENCOUNTER — Inpatient Hospital Stay (HOSPITAL_COMMUNITY): Payer: Medicare Other

## 2014-02-08 DIAGNOSIS — G20 Parkinson's disease: Secondary | ICD-10-CM | POA: Diagnosis present

## 2014-02-08 DIAGNOSIS — E43 Unspecified severe protein-calorie malnutrition: Secondary | ICD-10-CM

## 2014-02-08 DIAGNOSIS — F0390 Unspecified dementia without behavioral disturbance: Secondary | ICD-10-CM

## 2014-02-08 LAB — URINALYSIS, ROUTINE W REFLEX MICROSCOPIC
Bilirubin Urine: NEGATIVE
GLUCOSE, UA: NEGATIVE mg/dL
Ketones, ur: NEGATIVE mg/dL
LEUKOCYTES UA: NEGATIVE
Nitrite: NEGATIVE
PH: 6 (ref 5.0–8.0)
Protein, ur: NEGATIVE mg/dL
Urobilinogen, UA: 0.2 mg/dL (ref 0.0–1.0)

## 2014-02-08 LAB — CBC
HCT: 34 % — ABNORMAL LOW (ref 36.0–46.0)
HEMOGLOBIN: 11.3 g/dL — AB (ref 12.0–15.0)
MCH: 32.1 pg (ref 26.0–34.0)
MCHC: 33.2 g/dL (ref 30.0–36.0)
MCV: 96.6 fL (ref 78.0–100.0)
PLATELETS: 282 10*3/uL (ref 150–400)
RBC: 3.52 MIL/uL — AB (ref 3.87–5.11)
RDW: 15.1 % (ref 11.5–15.5)
WBC: 15 10*3/uL — AB (ref 4.0–10.5)

## 2014-02-08 LAB — URINE MICROSCOPIC-ADD ON

## 2014-02-08 MED ORDER — CARVEDILOL 6.25 MG PO TABS: 6.2500 mg | ORAL_TABLET | Freq: Two times a day (BID) | ORAL | Status: AC

## 2014-02-08 MED ORDER — SENNOSIDES-DOCUSATE SODIUM 8.6-50 MG PO TABS: 1.0000 | ORAL_TABLET | Freq: Every evening | ORAL | Status: DC | PRN

## 2014-02-08 MED ORDER — DILTIAZEM HCL ER COATED BEADS 120 MG PO CP24: 120.0000 mg | ORAL_CAPSULE | Freq: Every day | ORAL | Status: AC

## 2014-02-08 MED ORDER — APIXABAN 2.5 MG PO TABS: 2.5000 mg | ORAL_TABLET | Freq: Two times a day (BID) | ORAL | Status: DC

## 2014-02-08 MED ORDER — BOOST / RESOURCE BREEZE PO LIQD: 1.0000 | Freq: Three times a day (TID) | ORAL | Status: AC

## 2014-02-08 MED ORDER — TRAMADOL HCL 50 MG PO TABS: 50.0000 mg | ORAL_TABLET | Freq: Two times a day (BID) | ORAL | Status: AC | PRN

## 2014-02-08 MED ORDER — CARBIDOPA-LEVODOPA 25-100 MG PO TABS: 1.0000 | ORAL_TABLET | Freq: Three times a day (TID) | ORAL | Status: AC

## 2014-02-08 NOTE — Procedures (Signed)
[facility] Shone Leventhal A. Gerilyn Pilgrimoonquah, MD     [email or web address]           HISTORY: This is an Elderly female who presents with altered mental status. Study done to evaluate for seizures.  MEDICATIONS: Scheduled Meds: Continuous Infusions: PRN Meds:.    Prior to Admission medications   Medication Sig Start Date End Date Taking? Authorizing Provider  ALPRAZolam Prudy Feeler(XANAX) 0.5 MG tablet Take 0.5 mg by mouth 3 (three) times daily.   Yes Historical Provider, MD  calcitRIOL (ROCALTROL) 0.5 MCG capsule Take 1 capsule (0.5 mcg total) by mouth daily. 09/18/13  Yes Shanker Levora DredgeM Ghimire, MD  calcium citrate-vitamin D 200-200 MG-UNIT TABS Take 1 tablet by mouth daily.     Yes Historical Provider, MD  citalopram (CELEXA) 20 MG tablet Take 20 mg by mouth daily.   Yes Historical Provider, MD  donepezil (ARICEPT) 10 MG tablet Take 10 mg by mouth every evening.   Yes Historical Provider, MD  Fluticasone Furoate-Vilanterol (BREO ELLIPTA) 100-25 MCG/INH AEPB Inhale 1 puff into the lungs daily.   Yes Historical Provider, MD  furosemide (LASIX) 40 MG tablet Take 40 mg by mouth every Saturday. Takes only for swelling   Yes Historical Provider, MD  Ipratropium-Albuterol (COMBIVENT RESPIMAT) 20-100 MCG/ACT AERS respimat Inhale 1 puff into the lungs 4 (four) times daily.   Yes Historical Provider, MD  mirtazapine (REMERON) 15 MG tablet Take 1 tablet (15 mg total) by mouth at bedtime. 09/18/13  Yes Shanker Levora DredgeM Ghimire, MD  Multiple Vitamin (MULTIVITAMIN) tablet Take 1 tablet by mouth daily.     Yes Historical Provider, MD  nitroGLYCERIN (NITROSTAT) 0.4 MG SL tablet Place 1 tablet (0.4 mg total) under the tongue every 5 (five) minutes as needed. For chest pain 06/16/12  Yes Vesta MixerPhilip J Nahser, MD  Ranitidine HCl (ACID REDUCER PO) Take 1 tablet by mouth daily as needed (for acid reflux/gerd).    Yes Historical Provider, MD  sodium bicarbonate 650 MG tablet Take 650 mg by mouth 3 (three) times daily.    Yes Historical  Provider, MD  apixaban (ELIQUIS) 2.5 MG TABS tablet Take 1 tablet (2.5 mg total) by mouth 2 (two) times daily. 02/08/14   Jeralyn BennettEzequiel Zamora, MD  carbidopa-levodopa (SINEMET IR) 25-100 MG per tablet Take 1 tablet by mouth 3 (three) times daily. 02/11/14   Jeralyn BennettEzequiel Zamora, MD  carvedilol (COREG) 6.25 MG tablet Take 1 tablet (6.25 mg total) by mouth 2 (two) times daily with a meal. 02/08/14   Jeralyn BennettEzequiel Zamora, MD  diltiazem (CARDIZEM CD) 120 MG 24 hr capsule Take 1 capsule (120 mg total) by mouth daily. 02/08/14   Jeralyn BennettEzequiel Zamora, MD  feeding supplement, RESOURCE BREEZE, (RESOURCE BREEZE) LIQD Take 1 Container by mouth 3 (three) times daily between meals. 02/08/14   Jeralyn BennettEzequiel Zamora, MD  senna-docusate (SENOKOT-S) 8.6-50 MG per tablet Take 1 tablet by mouth at bedtime as needed for moderate constipation. 02/08/14   Jeralyn BennettEzequiel Zamora, MD  traMADol (ULTRAM) 50 MG tablet Take 1 tablet (50 mg total) by mouth every 12 (twelve) hours as needed for severe pain. 02/08/14   Jeralyn BennettEzequiel Zamora, MD      ANALYSIS: A 16 channel recording using standard 10 20 measurements is conducted for Approximately 20 minutes. She does be posterior dominant rhythm that gets as 7-1/2 Hz bilaterally. Beta activity is observed in the frontal areas. Awake and drowsy activities are recorded. Photic simulation and hyperventilation were not carried out. There are no focal or lateralized slowing. There is no epileptiform activity  observed.    IMPRESSION: 1. Mild generalized slowing indicating a mild generalized encephalopathy. However, there are no epileptiform activities observed.      Reeshemah Nazaryan A. Gerilyn Pilgrim, M.D.  Diplomate, Biomedical engineer of Psychiatry and Neurology ( Neurology).

## 2014-02-08 NOTE — Progress Notes (Deleted)
UR review complete.  

## 2014-02-08 NOTE — Progress Notes (Signed)
Pt. And daughters received discharge instructions and scripts and had no further questions or concerns.  Patient's IV was removed and was clean, dry, and intact upon removal.  Patient was escorted to vehicle via wheelchair by nurse tech with daughters.

## 2014-02-08 NOTE — Progress Notes (Signed)
     Subjective:  Feels well. Hoping to go home today.  Objective:  Vital Signs in the last 24 hours: Temp:  [97.6 F (36.4 C)-98.4 F (36.9 C)] 98 F (36.7 C) (08/17 0508) Pulse Rate:  [51-67] 60 (08/17 0508) Resp:  [18] 18 (08/17 0508) BP: (111-117)/(43-62) 117/54 mmHg (08/17 0508) SpO2:  [94 %-96 %] 96 % (08/17 0709)  Intake/Output from previous day: 08/16 0701 - 08/17 0700 In: 480 [P.O.:480] Out: 2325 [Urine:925; Stool:1400]   Physical Exam: NECK: Without JVD, HJR, or bruit LUNGS: Clear anterior, posterior, lateral HEART: Regular rate and rhythm, no murmur, gallop, rub, bruit, thrill, or heave EXTREMITIES: Without cyanosis, clubbing, or edema   Lab Results:  Recent Labs  02/07/14 0522 02/08/14 0529  WBC 7.7 15.0*  HGB 10.8* 11.3*  PLT 205 282    Recent Labs  02/06/14 0512 02/07/14 0522  NA 140 135*  K 3.9 4.5  CL 101 100  CO2 27 26  GLUCOSE 86 92  BUN 39* 44*  CREATININE 1.84* 1.78*    Recent Labs  02/05/14 2054 02/06/14 0512  TROPONINI <0.30 <0.30    Cardiac Studies: Study Conclusions  - Left ventricle: E/e&'>14.7 suggestive of elevated LV filling   pressures. The cavity size was normal. Wall thickness was normal.   Systolic function was vigorous. The estimated ejection fraction   was in the range of 65% to 70%. Wall motion was normal; there   were no regional wall motion abnormalities. - Right atrium: The atrium was mildly dilated. - Pulmonary arteries: PA peak pressure: 38 mm Hg (S).  Impressions:  - The right ventricular systolic pressure was increased consistent   with mild pulmonary hypertension.   Assessment/Plan:   Paroxysmal atrial fibrillation with rapid ventricular rate now in normal sinus rhythm on diltiazem CD 120 mg once daily and Eliquis 2.5 mg twice a day. This is a new problem for this patient. May have been precipitated by UTI. No further brady/tachy problems. [name] ([place] missed appt last  week).  CHF most likely secondary to atrial fibrillation with RVR. 2-D echo shows ejection fraction to be 65-70%. It had been 25% in the past.  Acute encephalopathy: Likely multifactorial with UTI, acute on chronic renal failure, and major depression  Acute on chronic renal failure: improving  Parkinson's: Neurology consult   Depression: Sister died of brain cancer on date of admission  Dementia  Hypertension   LOS: 7 days   [name] 02/08/2014, 8:22 AM   Attending note:  Agree with assessment by  [name]. Recent consultation by [name] noted. Patient with diagnosis of paroxysmal atrial fibrillation with RVR, CHADSVASC score of 4. She has been started on Cardizem CD 120 mg daily and Eliquis 2.5 mg BID. She has also been on Coreg at baseline with prior history of cardiomyopathy (normalized LVEF), not certain that this will need to be continued particularly if bradycardia becomes a problem. Aspirin should be stopped. No further cardiac workup is anticipated at this time, overall stable for discharge. We will schedule a followup visit with [name], her primary cardiologist.  [name], M.D., F.A.C.C.

## 2014-02-08 NOTE — Progress Notes (Signed)
NUTRITION FOLLOW UP  Intervention:   Continue with current plan of care  Nutrition Dx:   Inadequate oral intake related to decreased appetite as evidenced by diet hx, moderate fat and muscle depletion; progressing  Goal:   Pt will meet >90% of estimated nutritional needs; goal met  Monitor:   PO/supplement intake, labs, weight changes, I/O's  Assessment:   Pt ambulating with nursing at time of visit. Possible d/c home today per MD.  SLP has signed off due to no difficulty swallowing and good tolerance of diet. Pt is currently on a Heart Healthy diet and is eating very well; PO: 75-100%. She also has Resource Breeze TID ordered and is taking very well.  Noted a 10# wt gain x 5 days, likely due to fluid overload. Pt is currently on Lasix.  Pt with depression and flat affect due to multiple losses. Refused psych consult.  Labs reviewed. BUN/Creat: 45/1.87, stable, Na: 135. Mg and K WNL.   Height: Ht Readings from Last 1 Encounters:  02/01/14 5' 5.5" (1.664 m)    Weight Status:   Wt Readings from Last 1 Encounters:  02/04/14 120 lb 8 oz (54.658 kg)    Re-estimated needs:  Kcal: 1400-1600 Protein: 66-76 grams Fluid: 1.4-1.6 L  Skin: RLQ ileostomy  Diet Order: Cardiac   Intake/Output Summary (Last 24 hours) at 02/08/14 0842 Last data filed at 02/08/14 0509  Gross per 24 hour  Intake    480 ml  Output   2325 ml  Net  -1845 ml    Last BM: 02/08/14   Labs:   Recent Labs Lab 02/05/14 1200 02/06/14 0512 02/07/14 0522  NA 140 140 135*  K 3.7 3.9 4.5  CL 103 101 100  CO2 25 27 26   BUN 36* 39* 44*  CREATININE 1.62* 1.84* 1.78*  CALCIUM 9.2 8.8 9.3  MG  --  1.8  --   GLUCOSE 103* 86 92    CBG (last 3)  No results found for this basename: GLUCAP,  in the last 72 hours  Scheduled Meds: . ALPRAZolam  0.5 mg Oral TID  . antiseptic oral rinse  7 mL Mouth Rinse BID  . apixaban  2.5 mg Oral BID  . aspirin EC  81 mg Oral Daily  . calcitRIOL  0.5 mcg Oral Daily   . calcium-vitamin D  1 tablet Oral Q breakfast  . carbidopa-levodopa  1 tablet Oral BID  . [START ON 02/11/2014] carbidopa-levodopa  1 tablet Oral TID  . carvedilol  6.25 mg Oral BID WC  . cefUROXime  250 mg Oral Q12H  . citalopram  20 mg Oral Daily  . diltiazem  120 mg Oral Daily  . donepezil  10 mg Oral QPM  . feeding supplement (RESOURCE BREEZE)  1 Container Oral TID BM  . ipratropium-albuterol  3 mL Nebulization QID  . mirtazapine  15 mg Oral QHS  . mometasone-formoterol  2 puff Inhalation BID  . sodium bicarbonate  650 mg Oral TID    Continuous Infusions:   Cathline Dowen A. Jimmye Norman, RD, LDN Pager: [phone]

## 2014-02-08 NOTE — Progress Notes (Signed)
Ambulated patient in hallway on room air. Slow steady gait. O2 sat ambulating on room air 95%.

## 2014-02-08 NOTE — Discharge Summary (Signed)
Physician Discharge Summary  Melody CeriseBetty F Tomb ZOX:[id number] DOB: 05/30/1941 DOA: 02/01/2014  PCP: Toma DeitersHASANAJ,XAJE A, MD  Admit date: 02/01/2014 Discharge date: 02/08/2014  Time spent: 35 minutes  Recommendations for Outpatient Follow-up:  1. Please follow up on motor symptoms, diagnosed with Parkinson's, started on Levadopa during this hospitalization.  2. Follow up on Heart rates and blood pressures, has PAF, going into Afib with RVR during this hospitalization, Cardizem added.   Discharge Diagnoses:  Principal Problem:   Acute encephalopathy Active Problems:   Parkinson disease   Renal failure (ARF), acute on chronic   UTI (lower urinary tract infection)   Dehydration   Protein-calorie malnutrition, severe   Weakness   Depression   Hypertension   Chronic kidney disease   Dementia   Abdominal pain, chronic, left lower quadrant   CKD (chronic kidney disease) stage 3, GFR 30-59 ml/min   Discharge Condition: Stable  Diet recommendation: Heart Healthy  Filed Weights   02/01/14 2014 02/04/14 0408  Weight: 49.896 kg (110 lb) 54.658 kg (120 lb 8 oz)    History of present illness:  [name] is a 73 y.o. 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 Patient has a history of significant anxiety and depression, and last time she was hospitalized in March of 2015 with worsening of her psychiatric symptoms shortly after her husband passed away. Her daughters tell me that patient's sister died today from brain cancer. Over the last couple weeks, patient has been very withdrawn and with flat affect. Her daughter also tells me that today she seemed to be weaker and has  noticed patient to be lethargic and with saliva drooping out of her mouth. She was also concerned that she has been weaker and she thinks patient's right eye is "different" as well as the corner of her mouth on right is "different" and was also concerned for a stroke. Patient is very flat in the ED, the only thing that she endorses is feeling "sad" and having worse back pain. Family associates her back pain with a UTI which she has had in the past, however never being this lethargic. In the ED, labs show acute on chronic renal failure and mild leukocytosis of 14.    Hospital Course:  1. Acute encephalopathy -Likely multifactorial with possible urinary tract infection, acute on chronic renal failure, Parkinson's Disease, Dementia and depression all likely contributors. Family members reporting a functional decline over the past several months, characterized by a flat affect, generalized weakness, worsening depressive symptoms.   -She was given IV fluids and IV antibiotic therapy during this hospitalization.  -Started on Sinemet for Parkinson's by Neurology -Overall showing some improvement, however will need close outpatient monitoring -Home Health PT/OT and RN set up prior to discharge.    2.  Atrial Fibrillation with rapid ventricular response  -Patient going into Afib with RVR on 02/05/2014, cardiology consulted, started on cardizem 30 mg PO q 6 hours.  -Afib may have been precipitated by underlying infectious process  -Transthoracic echocardiogram performed on 02/06/2014 showing ejection fraction of 65-70% without wall motion abnormalities  -TSH within normal limits  -Patient remaining in sinus rhythm now for greater than 24 hours  -CHADS score of 4; will discharge on  Eliquis 2.5 mg PO BID  -Will follow up with her primary Cardiologist Dr Elease Hashimoto next week.   3.  Parkinsons  -Patient having resting tremors, with associated rigidity to upper extremities and cogwheeling, bradykinesia and  postural instability. She was not on antipsychotic medications which would have the potential for extrapyramidal symptoms.  -Neurology consulted, she was started on carbidopa/levadopa  -Continues to have some rigidity on exam -Will discharge on Sinemet TID, will follow up with Neurology   4.  Acute on chronic kidney injury.  -Given patient's likely underlying depression it is possible that she has had minimal by mouth intake and kidney failure related to prerenal azotemia  -Kidney function remains stable on a.m. lab work  -Has history of stage III chronic kidney disease with baseline creatinine near 1.5-1.7   5. Urinary tract infection  -Lab showing an elevated white count of 14,000, patient have a low-grade fever of 100.4.  -Has history of ileal conduit, make a urine analysis difficult to interpret  -Transitioned to oral Ceftin 250 mg PO BID, as Rocephin was discontinued on 02/06/2014 -Repeat u/a clear   6. Acute on chronic systolic congestive heart failure  -Pending 2D Echo, Afib with RVR likely precipitating heart failure. Labs showed a BNP of 2,256  -She was given dose of lasix on 02/05/2014, having a good urine output  -Now compensated  7. Protein Calorie Malnutrition  -Patient having a weight of 54 Kg, albumin 3.1  -Continue protein boost TID  Consultations:  Cardiology  Neurology  Discharge Exam: Filed Vitals:   02/08/14 0921  BP: 117/43  Pulse: 66  Temp:   Resp:   General: Flat affect, she is awake and alert oriented x3  Cardiovascular: Regular rate and rhythm, normal S1S2  Respiratory: Clear to auscultation bilaterally, normal respiratory effort  Abdomen: Soft nontender nondistended  Musculoskeletal: No edema Neurological: There is some rigidity in bilateral upper extremities, +cogwheeling, no shuffling gait.      Discharge Instructions You were cared for by a hospitalist during your hospital stay. If you have any questions about your discharge medications or  the care you received while you were in the hospital after you are discharged, you can call the unit and asked to speak with the hospitalist on call if the hospitalist that took care of you is not available. Once you are discharged, your primary care physician will handle any further medical issues. Please note that NO REFILLS for any discharge medications will be authorized once you are discharged, as it is imperative that you return to your primary care physician (or establish a relationship with a primary care physician if you do not have one) for your aftercare needs so that they can reassess your need for medications and monitor your lab values.      Discharge Instructions   Call MD for:  difficulty breathing, headache or visual disturbances    Complete by:  As directed      Call MD for:  extreme fatigue    Complete by:  As directed      Call MD for:  persistant dizziness or light-headedness    Complete by:  As directed      Call MD for:  persistant nausea and vomiting    Complete by:  As directed      Call MD for:  severe uncontrolled pain    Complete by:  As directed      Call MD for:  temperature >100.4    Complete by:  As directed  Diet - low sodium heart healthy    Complete by:  As directed      Increase activity slowly    Complete by:  As directed             Medication List    STOP taking these medications       aspirin EC 81 MG tablet      TAKE these medications       ACID REDUCER PO  Take 1 tablet by mouth daily as needed (for acid reflux/gerd).     ALPRAZolam 0.5 MG tablet  Commonly known as:  XANAX  Take 0.5 mg by mouth 3 (three) times daily.     apixaban 2.5 MG Tabs tablet  Commonly known as:  ELIQUIS  Take 1 tablet (2.5 mg total) by mouth 2 (two) times daily.     BREO ELLIPTA 100-25 MCG/INH Aepb  Generic drug:  Fluticasone Furoate-Vilanterol  Inhale 1 puff into the lungs daily.     calcitRIOL 0.5 MCG capsule  Commonly known as:  ROCALTROL  Take  1 capsule (0.5 mcg total) by mouth daily.     calcium citrate-vitamin D 200-200 MG-UNIT Tabs  Take 1 tablet by mouth daily.     carbidopa-levodopa 25-100 MG per tablet  Commonly known as:  SINEMET IR  Take 1 tablet by mouth 3 (three) times daily.  Start taking on:  02/11/2014     carvedilol 6.25 MG tablet  Commonly known as:  COREG  Take 1 tablet (6.25 mg total) by mouth 2 (two) times daily with a meal.     citalopram 20 MG tablet  Commonly known as:  CELEXA  Take 20 mg by mouth daily.     COMBIVENT RESPIMAT 20-100 MCG/ACT Aers respimat  Generic drug:  Ipratropium-Albuterol  Inhale 1 puff into the lungs 4 (four) times daily.     diltiazem 120 MG 24 hr capsule  Commonly known as:  CARDIZEM CD  Take 1 capsule (120 mg total) by mouth daily.     donepezil 10 MG tablet  Commonly known as:  ARICEPT  Take 10 mg by mouth every evening.     feeding supplement (RESOURCE BREEZE) Liqd  Take 1 Container by mouth 3 (three) times daily between meals.     furosemide 40 MG tablet  Commonly known as:  LASIX  Take 40 mg by mouth every Saturday. Takes only for swelling     mirtazapine 15 MG tablet  Commonly known as:  REMERON  Take 1 tablet (15 mg total) by mouth at bedtime.     multivitamin tablet  Take 1 tablet by mouth daily.     nitroGLYCERIN 0.4 MG SL tablet  Commonly known as:  NITROSTAT  Place 1 tablet (0.4 mg total) under the tongue every 5 (five) minutes as needed. For chest pain     senna-docusate 8.6-50 MG per tablet  Commonly known as:  Senokot-S  Take 1 tablet by mouth at bedtime as needed for moderate constipation.     sodium bicarbonate 650 MG tablet  Take 650 mg by mouth 3 (three) times daily.     traMADol 50 MG tablet  Commonly known as:  ULTRAM  Take 1 tablet (50 mg total) by mouth every 12 (twelve) hours as needed for severe pain.       Allergies  Allergen Reactions  . Demerol [Meperidine] Other (See Comments)    Reaction is unknown  . Ketorolac  Tromethamine Rash   Follow-up Information   Follow  up with HASANAJ,XAJE A, MD In 1 day.   Specialty:  Internal Medicine   Contact information:   [address] [place] [address] [phone]       Follow up with [facility] A, MD.   Specialty:  Internal Medicine   Contact information:   [address] [place] [address] [phone]       Follow up with [facility], KOFI, MD In 2 weeks.   Specialty:  Neurology   Contact information:   [address] [place] [address] [phone]       Follow up with Elyn Aquas., MD In 1 week.   Specialty:  Cardiology   Contact information:   [address] [place] [address] [phone]        The results of significant diagnostics from this hospitalization (including imaging, microbiology, ancillary and laboratory) are listed below for reference.    Significant Diagnostic Studies: Ct Abdomen Pelvis Wo Contrast  02/01/2014   CLINICAL DATA:  Flank pain.  Stage III renal disease.  EXAM: CT ABDOMEN AND PELVIS WITHOUT CONTRAST  TECHNIQUE: Multidetector CT imaging of the abdomen and pelvis was performed following the standard protocol without IV contrast.  COMPARISON:  Renal ultrasound performed 05/19/2010, and CT of the left hip performed 01/14/2012; CT of the abdomen and pelvis from 11/16/2009  FINDINGS: Minimal bibasilar atelectasis is noted.  The liver and spleen are unremarkable in appearance. The patient appears to be status post cholecystectomy. The pancreas and adrenal glands are unremarkable.  The patient is status post left-sided nephrectomy. There is mild left renal atrophy and scarring. Mild to moderate left-sided hydronephrosis is seen, grossly unchanged from the prior studies, without evidence of a distal obstructing stone. This may reflect the patient's urostomy.  No renal or ureteral stones are identified. Mild nonspecific left-sided perinephric stranding is noted.  No free fluid is  identified. The remaining small bowel is grossly unremarkable. The stomach is within normal limits. No acute vascular abnormalities are seen. Scattered calcification is seen along the abdominal aorta and its branches.  The patient's urostomy is difficult to fully assess, as it appears largely decompressed. No focal abnormalities are seen.  The patient is status post appendectomy. The colon is partially filled with stool and is unremarkable in appearance.  The bladder is decompressed and not well assessed. The patient is status post hysterectomy. No suspicious adnexal masses are seen. No inguinal lymphadenopathy is seen.  No acute osseous abnormalities are identified. A left hip arthroplasty is partially imaged and appears grossly unremarkable.  IMPRESSION: 1. No acute abnormality seen to explain the patient's symptoms. 2. Persistent mild to moderate left-sided hydronephrosis, grossly unchanged from 2011, without evidence of a distal obstructing stone. Underlying urostomy is grossly unremarkable, but not well assessed. 3. Mild left renal atrophy and scarring noted. 4. Scattered calcification along the abdominal aorta and its branches.   Electronically Signed   By: Roanna Raider M.D.   On: 02/01/2014 23:24   Dg Chest 2 View  02/08/2014   CLINICAL DATA:  Elevated white count.  EXAM: CHEST  2 VIEW  COMPARISON:  February 05, 2014.  FINDINGS: The heart size and mediastinal contours are within normal limits. Both lungs are clear. No pneumothorax is noted. Minimal bilateral pleural effusions are noted. The visualized skeletal structures are unremarkable.  IMPRESSION: Minimal bilateral pleural effusions. No other significant abnormality seen in the chest.   Electronically Signed   By: Marry Guan.D.  On: 02/08/2014 10:08   Dg Chest 2 View  02/05/2014   CLINICAL DATA:  Chest pain.  Short of breath.  EXAM: CHEST  2 VIEW  COMPARISON:  02/01/2014.  09/15/2013.  FINDINGS: Artifact overlies the chest. Heart size is  normal. There is calcification of the thoracic aorta there are new small effusions with mild patchy density at the lung bases. This could be atelectasis or mild basilar pneumonia. Upper lungs remain clear.  IMPRESSION: New bilateral small effusions and patchy basilar lung density. This could be atelectasis or mild basilar pneumonia.   Electronically Signed   By: Paulina Fusi M.D.   On: 02/05/2014 13:50   Dg Chest 2 View  02/01/2014   CLINICAL DATA:  Flank pain.  Drooling.  Abnormal affect.  EXAM: CHEST  2 VIEW  COMPARISON:  Chest x-ray 09/15/2013.  FINDINGS: Lung volumes are low. No consolidative airspace disease. No pleural effusions. No pneumothorax. No pulmonary nodule or mass noted. Pulmonary vasculature and the cardiomediastinal silhouette are within normal limits.  IMPRESSION: 1. Low lung volumes without radiographic evidence of acute cardiopulmonary disease.   Electronically Signed   By: Trudie Reed M.D.   On: 02/01/2014 23:37   Dg Shoulder Right  02/04/2014   CLINICAL DATA:  Right shoulder pain no injury.  EXAM: RIGHT SHOULDER - 2+ VIEW  COMPARISON:  Right shoulder radiographs 02/28/2006.  FINDINGS: Normal anatomic alignment. No evidence for acute fracture or dislocation. Regional soft tissues are unremarkable. Visualized right hemithorax is unremarkable.  IMPRESSION: No acute osseous abnormality involving the right shoulder.  No significant osseous degenerative change.   Electronically Signed   By: Annia Belt M.D.   On: 02/04/2014 18:24   Ct Head Wo Contrast  02/01/2014   CLINICAL DATA:  Drooling.  Abnormal affect.  EXAM: CT HEAD WITHOUT CONTRAST  TECHNIQUE: Contiguous axial images were obtained from the base of the skull through the vertex without intravenous contrast.  COMPARISON:  None.  FINDINGS: Cavum septum pellucidum et vergae (normal anatomical variant) incidentally noted. Mild cerebral and cerebellar atrophy. Patchy and confluent areas of decreased attenuation are noted throughout  the deep and periventricular white matter of the cerebral hemispheres bilaterally, compatible with chronic microvascular ischemic disease. No acute intracranial abnormalities. Specifically, no evidence of acute intracranial hemorrhage, no definite findings of acute/subacute cerebral ischemia, no mass, mass effect, hydrocephalus or abnormal intra or extra-axial fluid collections. Visualized paranasal sinuses and mastoids are well pneumatized. No acute displaced skull fractures are identified.  IMPRESSION: 1. No acute intracranial abnormalities. 2. Mild cerebral and cerebellar atrophy with chronic microvascular ischemic changes in the cerebral white matter, similar to the prior examination.   Electronically Signed   By: Trudie Reed M.D.   On: 02/01/2014 23:20   Mr Brain Wo Contrast  02/02/2014   CLINICAL DATA:  Increasing confusion.  Stroke.  EXAM: MRI HEAD WITHOUT CONTRAST  TECHNIQUE: Multiplanar, multiecho pulse sequences of the brain and surrounding structures were obtained without intravenous contrast.  COMPARISON:  Head CT 02/01/2014  FINDINGS: Images are mildly degraded by motion.  There is no evidence of acute infarct, intracranial hemorrhage, mass, midline shift, or extra-axial fluid collection. Incidental note is made of a cavum septum pellucidum et vergae. Patchy and confluent T2 hyperintensities in the subcortical and deep cerebral white matter and brainstem are nonspecific but compatible with moderate chronic small vessel ischemic disease. There is mild generalized cerebral atrophy.  Orbits are unremarkable. Paranasal sinuses and mastoid air cells are clear. Major intracranial vascular flow voids  are preserved.  IMPRESSION: 1. No acute intracranial abnormality. 2. Moderate chronic small vessel ischemic disease.   Electronically Signed   By: Sebastian Ache   On: 02/02/2014 19:36    Microbiology: Recent Results (from the past 240 hour(s))  URINE CULTURE     Status: None   Collection Time     02/01/14 11:35 PM      Result Value Ref Range Status   Specimen Description URINE, CLEAN CATCH   Final   Special Requests NONE   Final   Culture  Setup Time     Final   Value: 02/02/2014 13:10     Performed at [facility] Count     Final   Value: >=100,000 COLONIES/ML     Performed at [facility]   Culture     Final   Value: Multiple bacterial morphotypes present, none predominant. Suggest appropriate recollection if clinically indicated.     Performed at [facility]   Report Status 02/03/2014 FINAL   Final     Labs: Basic Metabolic Panel:  Recent Labs Lab 02/03/14 0551 02/04/14 0638 02/05/14 1200 02/06/14 0512 02/07/14 0522  NA 142 143 140 140 135*  K 3.7 4.1 3.7 3.9 4.5  CL 105 107 103 101 100  CO2 26 25 25 27 26   GLUCOSE 99 90 103* 86 92  BUN 45* 41* 36* 39* 44*  CREATININE 1.87* 1.68* 1.62* 1.84* 1.78*  CALCIUM 8.4 8.7 9.2 8.8 9.3  MG  --   --   --  1.8  --    Liver Function Tests:  Recent Labs Lab 02/01/14 2215  AST 15  ALT 14  ALKPHOS 111  BILITOT 0.3  PROT 6.9  ALBUMIN 3.1*    Recent Labs Lab 02/01/14 2215  LIPASE 42   No results found for this basename: AMMONIA,  in the last 168 hours CBC:  Recent Labs Lab 02/01/14 2215  02/04/14 0638 02/05/14 1200 02/06/14 0512 02/07/14 0522 02/08/14 0529  WBC 14.0*  < > 7.3 7.5 7.4 7.7 15.0*  NEUTROABS 11.1*  --   --   --   --   --   --   HGB 12.0  < > 11.4* 11.3* 10.9* 10.8* 11.3*  HCT 36.0  < > 34.8* 34.7* 33.6* 34.2* 34.0*  MCV 96.5  < > 97.5 97.5 98.5 100.0 96.6  PLT 137*  < > 181 213 217 205 282  < > = values in this interval not displayed. Cardiac Enzymes:  Recent Labs Lab 02/01/14 2215 02/05/14 1125 02/05/14 2054 02/06/14 0512  TROPONINI <0.30 <0.30 <0.30 <0.30   BNP: BNP (last 3 results)  Recent Labs  02/01/14 2215 02/05/14 1125  PROBNP 314.7* 2256.0*   CBG:  Recent Labs Lab 02/05/14 0732  GLUCAP 92       Signed:  Kendelle Schweers,  Mylez Venable  [facility] 02/08/2014, 11:49 AM

## 2014-02-09 NOTE — Consult Note (Signed)
Case discussed, treatment plan formulated by me. Patient needs to followup with primary care physician as patient refuses to talk to a psychiatrist or nurse practitioner

## 2014-03-03 ENCOUNTER — Ambulatory Visit (INDEPENDENT_AMBULATORY_CARE_PROVIDER_SITE_OTHER): Payer: Medicare Other | Admitting: Cardiovascular Disease

## 2014-03-03 ENCOUNTER — Encounter: Payer: Self-pay | Admitting: Cardiovascular Disease

## 2014-03-03 VITALS — BP 124/66 | HR 58 | Ht 65.5 in | Wt 121.8 lb

## 2014-03-03 DIAGNOSIS — I5022 Chronic systolic (congestive) heart failure: Secondary | ICD-10-CM

## 2014-03-03 DIAGNOSIS — I509 Heart failure, unspecified: Secondary | ICD-10-CM

## 2014-03-03 MED ORDER — AMLODIPINE BESYLATE 5 MG PO TABS: 5.0000 mg | ORAL_TABLET | Freq: Every day | ORAL | Status: AC

## 2014-03-03 NOTE — Progress Notes (Signed)
[name] Date of Birth  [birth year] [facility] [address]    [address] [place], [place]  [address] [phone]  Fax  [phone]  History of Present Illness:  73 year old female with a history of left ventricular systolic heart failure after hip fracture.  She complains of some intermittent episodes of chest pain. She states that it feels like soreness  in the chest. It lasts for several minutes. It causes her some diaphoresis.  She has a history of congestive heart failure. Her ejection fraction in December 2001 was normal. Her most recent ejection fraction is now 25%.  Has a history of chronic renal insufficiency. When I saw her in November, 2011, she had acute renal insufficiency with a creatinine of 5.5. She also had mildly positive cardiac enzymes thought to be to her underlying medical illnesses.  She has a history of chronic renal insufficiency. She's followed by the nephrologists Marina Gravel, MD). She has had several episodes of acute renal failure and several attempts have been made to place an AV fistula in her. Had not been able to place an AV fistula because of her poor veins.   She continues to have some intermittent episodes of chest pain. These occur particularly when she bends over to make the bed.  She does not get any regular exercise. She does bring in the groceries on occasion she does not typically have any chest pain with activity.  Her bedroom is on the first floor of her house so she does not climb stairs.    Is fairly unsteady on her feet. She falls fairly frequently against the walls. She has a large bruise on her left arm.  She complains of being profoundly weak. She doesn't have much chest pain and it does not seem to limit her daily activities. She appears to be generally stronger today than she was the last time I saw her.  December 22, 2012: [name] is doing about the same.  She feels weak on occasion.   She has occasional CP.  Typically in the  left side of her chest ( she had to ask her husband about where her pains were) , no dyspnea.  She seems to have some memory issues.  Needs assurance from her husband for most of her answers.    Dec. 23, 2014: She is doing well from a cardiac standpoint.  She broke her right wrist.   Was changing her bed and mattress knocked her down.    Feb. 9, 2015:  That he was recently hospitalized for confusion. She has a history of dementia, chronic systolic heart failure, hypertension, chronic kidney disease. She presented today for routine followup and complained of having some chest pain while in the lobby. She was given one sublingual nitroglycerin.  She is pain free at this point.   It turns out that this was due to anxiety.  No active cardiac issues  She had a left femoral IV line placed.  This site has been oozing.  She continues to have some drainage from the site.   She was seen in the ER for this.  The draigage continues to improve.    Sept. 9, 2015  She was seen with her daughter [name] today She was hospitalized in August for mental status changes.  Drooling,  CT was negative for stroke.  She has chronic UTIs which may have contributed to this.   Was found to have a-fib with RVR.  Was also found to have  Parkinsons.    She was seen by cardiology for the A-fib and was started on Diltiazem and Eliquis along with the carvedilol.    When the daughter went to refill the Eliquis, the insurance would not approve and when she went to her medical doctors office ( Dr. Olena Leatherwood) he thought that she was at too great a risk for anticoagulation.    Current Outpatient Prescriptions on File Prior to Visit  Medication Sig Dispense Refill  . ALPRAZolam (XANAX) 0.5 MG tablet Take 0.5 mg by mouth 3 (three) times daily.      . calcitRIOL (ROCALTROL) 0.5 MCG capsule Take 1 capsule (0.5 mcg total) by mouth daily.      . calcium citrate-vitamin D 200-200 MG-UNIT TABS Take 1 tablet by mouth daily.        .  carbidopa-levodopa (SINEMET IR) 25-100 MG per tablet Take 1 tablet by mouth 3 (three) times daily.  90 tablet  1  . carvedilol (COREG) 6.25 MG tablet Take 1 tablet (6.25 mg total) by mouth 2 (two) times daily with a meal.  60 tablet  1  . citalopram (CELEXA) 20 MG tablet Take 20 mg by mouth daily.      Marland Kitchen diltiazem (CARDIZEM CD) 120 MG 24 hr capsule Take 1 capsule (120 mg total) by mouth daily.  30 capsule  1  . donepezil (ARICEPT) 10 MG tablet Take 10 mg by mouth every evening.      . feeding supplement, RESOURCE BREEZE, (RESOURCE BREEZE) LIQD Take 1 Container by mouth 3 (three) times daily between meals.  20 Container  0  . Fluticasone Furoate-Vilanterol (BREO ELLIPTA) 100-25 MCG/INH AEPB Inhale 1 puff into the lungs daily.      . furosemide (LASIX) 40 MG tablet Take 40 mg by mouth 2 (two) times daily. TAKES 1 TAB IN THE AM  AND 1 TAB BEFORE 5PM       . Ipratropium-Albuterol (COMBIVENT RESPIMAT) 20-100 MCG/ACT AERS respimat Inhale 1 puff into the lungs 4 (four) times daily.      . mirtazapine (REMERON) 15 MG tablet Take 1 tablet (15 mg total) by mouth at bedtime.      . Multiple Vitamin (MULTIVITAMIN) tablet Take 1 tablet by mouth daily.        . nitroGLYCERIN (NITROSTAT) 0.4 MG SL tablet Place 1 tablet (0.4 mg total) under the tongue every 5 (five) minutes as needed. For chest pain  25 tablet  5  . sodium bicarbonate 650 MG tablet Take 650 mg by mouth 3 (three) times daily.       . traMADol (ULTRAM) 50 MG tablet Take 1 tablet (50 mg total) by mouth every 12 (twelve) hours as needed for severe pain.  30 tablet  1   No current facility-administered medications on file prior to visit.  she is not taking the Atenolol  Allergies  Allergen Reactions  . Demerol [Meperidine] Other (See Comments)    Reaction is unknown  . Ketorolac Tromethamine Rash    Past Medical History  Diagnosis Date  . Gastroesophageal reflux   . Chronic UTI   . AV fistula     clotted in her left forearm  .  Cellulitis   . Pneumothorax     secondary to central line placement  . Myocardial infarction 2011; 2012  . Numbness and tingling of both legs   . Anginal pain   . Heart murmur   . Dysrhythmia     "heart beats real fast"  . Chronic anemia   .  Pneumonia     "one time"  . Chronic chest pain 01/14/12    1-2 X/day/H&P  . Arthritis     "at different places"  . Anxiety   . Depression   . Headache(784.0)     "alot; not daily"  . Frequent falls   . Dementia   . Collagen vascular disease   . S/P ileal conduit     post cystectomy/ileal conduit  . Anemia     Received IV Feraheme (x) 2 at [facility]  . Iron deficiency     Received IV Feraheme (x) 2 at [facility].  . Dysarthria 03/31/13    Speech is almost a little bit dysarthritic  . Unsteady     Stronger since she came out of nursing home & is walking with a cane  . Edema 03/31/13    Gained 9 pounds overnight & telephone nurse instructed her to take  of Lasix bid on 03/30/13. Still has swelling in legs.  . Metabolic acidosis     Chronic  . Hypertension     Good control  . CHF (congestive heart failure)     Systolic heart failure EF 25-30% by ECHO 2012  . Altered mental state     Recurrent episodes, with confusion variously related to narcotic use, infection, etc. and has required hospitilization, nursing home stays for rehab related to weakness, encephalopathy, etc.  . History of sepsis 2012    With E.Coli bacteremia  . E coli bacteremia 2012    History of sepsis & E.Coli bacteremia  . Secondary hyperparathyroidism     Dr. Caryn Section had a sestamibi scan of her neck in May 2014 which was negative for parathyroid activity in her neck.  . Chronic kidney disease (CKD), stage III (moderate)     [name] 09/15/2013  . Hydronephrosis     She has chronic mild left   . Single kidney     post right nephrectomy  . Renal insufficiency     Past Surgical History  Procedure Laterality Date  . Hip arthroplasty      left; S/P fracture  . Rod  left leg  2012    left hip "to almost my knee; for my hip"  . Creation / revision of ileostomy / jejunostomy  1973  . Nephrectomy  1973    right  . Fracture surgery    . Partial colectomy  1970  . Tonsillectomy and adenoidectomy      "when I was a kid"  . Cholecystectomy  2000  . Appendectomy  2000  . Abdominal hysterectomy    . Dilation and curettage of uterus    . Cesarean section  1969    only 4th of 4 children was c-section  . Av fistula placement  1970's    LFA  . Bladder removal  1974    with ileal conduit and urostomy    History  Smoking status  . Former Smoker -- 2 years  . Types: Cigarettes  . Quit date: 06/25/2004  Smokeless tobacco  . Never Used    Comment: 09/15/2013  "just smoked one cigarette, once in awhile for ~ 2 years"    History  Alcohol Use No    Family History  Problem Relation Age of Onset  . Heart attack Mother   . Healthy Daughter   . Healthy Daughter   . Healthy Son     Reviw of Systems:  Reviewed in the HPI.  All other systems are negative.  Physical Exam:  BP 124/66  Pulse 58  Ht 5' 5.5" (1.664 m)  Wt 121 lb 12.8 oz (55.248 kg)  BMI 19.95 kg/m2 The patient is alert and oriented x 3.  The mood and affect are normal.   Skin: warm and dry.  She is very weak.    HEENT:   Normal carotids, no JVD Lungs: clear  Heart: RR, no murmurs   Abdomen: soft, good BS Extremities:  No leg edema Neuro:  Generally improving ECG: Feb. 9, 2015:  NSR at 64.  No ST or T wave changes.   Assessment / Plan:

## 2014-03-03 NOTE — Assessment & Plan Note (Signed)
She is doing well.  She has a hx of chronic systolic CHF but her LV function has improved.   She is not having any limitations due to there CHf at this point.  She is more limited by her Paarkinsons and dementia.   We will continue her current meds. I have talked to her daughter and have said that i would be happy to see her in 6 months for follow up visit.  As her condition declines,  She may want to see her medical doctor for all of her medication refills and see me me on an as needed basis.

## 2014-03-03 NOTE — Patient Instructions (Signed)
Your physician recommends that you continue on your current medications as directed. Please refer to the Current Medication list given to you today.  Your physician wants you to follow-up in: 6 months with Dr. Nahser.  You will receive a reminder letter in the mail two months in advance. If you don't receive a letter, please call our office to schedule the follow-up appointment.  

## 2014-03-04 ENCOUNTER — Encounter: Payer: Self-pay | Admitting: Cardiovascular Disease

## 2014-03-19 ENCOUNTER — Encounter (HOSPITAL_COMMUNITY): Payer: Self-pay | Admitting: Emergency Medicine

## 2014-03-19 ENCOUNTER — Emergency Department (HOSPITAL_COMMUNITY)
Admission: EM | Admit: 2014-03-19 | Discharge: 2014-03-19 | Disposition: A | Discharge status: Home / Self Care | Payer: Medicare Other | Attending: Emergency Medicine | Admitting: Emergency Medicine

## 2014-03-19 DIAGNOSIS — Z872 Personal history of diseases of the skin and subcutaneous tissue: Secondary | ICD-10-CM | POA: Insufficient documentation

## 2014-03-19 DIAGNOSIS — F0390 Unspecified dementia without behavioral disturbance: Secondary | ICD-10-CM | POA: Diagnosis not present

## 2014-03-19 DIAGNOSIS — F3289 Other specified depressive episodes: Secondary | ICD-10-CM | POA: Insufficient documentation

## 2014-03-19 DIAGNOSIS — Z79899 Other long term (current) drug therapy: Secondary | ICD-10-CM | POA: Insufficient documentation

## 2014-03-19 DIAGNOSIS — M129 Arthropathy, unspecified: Secondary | ICD-10-CM | POA: Insufficient documentation

## 2014-03-19 DIAGNOSIS — I129 Hypertensive chronic kidney disease with stage 1 through stage 4 chronic kidney disease, or unspecified chronic kidney disease: Secondary | ICD-10-CM | POA: Insufficient documentation

## 2014-03-19 DIAGNOSIS — Z9181 History of falling: Secondary | ICD-10-CM | POA: Insufficient documentation

## 2014-03-19 DIAGNOSIS — Z8701 Personal history of pneumonia (recurrent): Secondary | ICD-10-CM | POA: Diagnosis not present

## 2014-03-19 DIAGNOSIS — N183 Chronic kidney disease, stage 3 unspecified: Secondary | ICD-10-CM | POA: Diagnosis not present

## 2014-03-19 DIAGNOSIS — N390 Urinary tract infection, site not specified: Secondary | ICD-10-CM | POA: Diagnosis not present

## 2014-03-19 DIAGNOSIS — IMO0002 Reserved for concepts with insufficient information to code with codable children: Secondary | ICD-10-CM | POA: Diagnosis not present

## 2014-03-19 DIAGNOSIS — E860 Dehydration: Secondary | ICD-10-CM | POA: Insufficient documentation

## 2014-03-19 DIAGNOSIS — I5020 Unspecified systolic (congestive) heart failure: Secondary | ICD-10-CM | POA: Insufficient documentation

## 2014-03-19 DIAGNOSIS — I252 Old myocardial infarction: Secondary | ICD-10-CM | POA: Insufficient documentation

## 2014-03-19 DIAGNOSIS — I209 Angina pectoris, unspecified: Secondary | ICD-10-CM | POA: Insufficient documentation

## 2014-03-19 DIAGNOSIS — F411 Generalized anxiety disorder: Secondary | ICD-10-CM | POA: Insufficient documentation

## 2014-03-19 DIAGNOSIS — F329 Major depressive disorder, single episode, unspecified: Secondary | ICD-10-CM | POA: Diagnosis not present

## 2014-03-19 DIAGNOSIS — Z862 Personal history of diseases of the blood and blood-forming organs and certain disorders involving the immune mechanism: Secondary | ICD-10-CM | POA: Diagnosis not present

## 2014-03-19 DIAGNOSIS — R011 Cardiac murmur, unspecified: Secondary | ICD-10-CM | POA: Insufficient documentation

## 2014-03-19 DIAGNOSIS — G8929 Other chronic pain: Secondary | ICD-10-CM | POA: Diagnosis not present

## 2014-03-19 DIAGNOSIS — Z8619 Personal history of other infectious and parasitic diseases: Secondary | ICD-10-CM | POA: Insufficient documentation

## 2014-03-19 DIAGNOSIS — Z792 Long term (current) use of antibiotics: Secondary | ICD-10-CM | POA: Insufficient documentation

## 2014-03-19 DIAGNOSIS — Z905 Acquired absence of kidney: Secondary | ICD-10-CM | POA: Diagnosis not present

## 2014-03-19 DIAGNOSIS — Z7982 Long term (current) use of aspirin: Secondary | ICD-10-CM | POA: Insufficient documentation

## 2014-03-19 DIAGNOSIS — Z87891 Personal history of nicotine dependence: Secondary | ICD-10-CM | POA: Diagnosis not present

## 2014-03-19 DIAGNOSIS — K219 Gastro-esophageal reflux disease without esophagitis: Secondary | ICD-10-CM | POA: Insufficient documentation

## 2014-03-19 DIAGNOSIS — Z8709 Personal history of other diseases of the respiratory system: Secondary | ICD-10-CM | POA: Insufficient documentation

## 2014-03-19 LAB — URINALYSIS, ROUTINE W REFLEX MICROSCOPIC
BILIRUBIN URINE: NEGATIVE
GLUCOSE, UA: NEGATIVE mg/dL
Hgb urine dipstick: NEGATIVE
Ketones, ur: NEGATIVE mg/dL
NITRITE: POSITIVE — AB
PH: 7 (ref 5.0–8.0)
Protein, ur: NEGATIVE mg/dL
Specific Gravity, Urine: <1.005 — ABNORMAL LOW (ref 1.005–1.030)
Urobilinogen, UA: 0.2 mg/dL (ref 0.0–1.0)

## 2014-03-19 LAB — URINE MICROSCOPIC-ADD ON

## 2014-03-19 MED ORDER — CIPROFLOXACIN HCL 250 MG PO TABS
500.0000 mg | ORAL_TABLET | Freq: Once | ORAL | Status: AC
  Administered 2014-03-19: 500 mg via ORAL
  Filled 2014-03-19: qty 2

## 2014-03-19 MED ORDER — CIPROFLOXACIN HCL 500 MG PO TABS: 500.0000 mg | ORAL_TABLET | Freq: Every morning | ORAL | Status: AC

## 2014-03-19 NOTE — ED Provider Notes (Signed)
CSN: [id number]     Arrival date & time 03/19/14  1539 History   First MD Initiated Contact with Patient 03/19/14 1616    This chart was scribed for Donnetta Hutching, MD by Gwenevere Abbot, ED scribe. This patient was seen in room APA02/APA02 and the patient's care was started at 3:47 PM.  Chief Complaint  Patient presents with  . Dehydration    The history is provided by the patient. No language interpreter was used.   HPI Comments:  [name] is a 73 y.o. female who presents to the Emergency Department complaining of a urinary tract infection.  She apparently was seen by her neurologist doctor yesterday for followup for her Parkinson's disease. Labs and urinalysis were obtained at that time. According to daughter, patient was contacted today by the neurologist's office with the following information: Patient had a urinary tract infection and her creatinine was slightly elevated. She is drinking fluids well. Passing urine appropriately. No fever or chills. No chest pain, dyspnea, neurological deficits. Severity is mild.  Past Medical History  Diagnosis Date  . Gastroesophageal reflux   . Chronic UTI   . AV fistula     clotted in her left forearm  . Cellulitis   . Pneumothorax     secondary to central line placement  . Myocardial infarction 2011; 2012  . Numbness and tingling of both legs   . Anginal pain   . Heart murmur   . Dysrhythmia     "heart beats real fast"  . Chronic anemia   . Pneumonia     "one time"  . Chronic chest pain 01/14/12    1-2 X/day/H&P  . Arthritis     "at different places"  . Anxiety   . Depression   . Headache(784.0)     "alot; not daily"  . Frequent falls   . Dementia   . Collagen vascular disease   . S/P ileal conduit     post cystectomy/ileal conduit  . Anemia     Received [facility] 2 at [facility]  . Iron deficiency     Received IV Feraheme (x) 2 at [facility].  . Dysarthria 03/31/13    Speech is almost a little bit dysarthritic  .  Unsteady     Stronger since she came out of nursing home & is walking with a cane  . Edema 03/31/13    Gained 9 pounds overnight & telephone nurse instructed her to take  of Lasix bid on 03/30/13. Still has swelling in legs.  . Metabolic acidosis     Chronic  . Hypertension     Good control  . CHF (congestive heart failure)     Systolic heart failure EF 25-30% by ECHO 2012  . Altered mental state     Recurrent episodes, with confusion variously related to narcotic use, infection, etc. and has required hospitilization, nursing home stays for rehab related to weakness, encephalopathy, etc.  . History of sepsis 2012    With E.Coli bacteremia  . E coli bacteremia 2012    History of sepsis & E.Coli bacteremia  . Secondary hyperparathyroidism     Dr. Caryn Section had a sestamibi scan of her neck in May 2014 which was negative for parathyroid activity in her neck.  . Chronic kidney disease (CKD), stage III (moderate)     [name] 09/15/2013  . Hydronephrosis     She has chronic mild left   . Single kidney     post right  nephrectomy  . Renal insufficiency    Past Surgical History  Procedure Laterality Date  . Hip arthroplasty      left; S/P fracture  . Rod left leg  2012    left hip "to almost my knee; for my hip"  . Creation / revision of ileostomy / jejunostomy  1973  . Nephrectomy  1973    right  . Fracture surgery    . Partial colectomy  1970  . Tonsillectomy and adenoidectomy      "when I was a kid"  . Cholecystectomy  2000  . Appendectomy  2000  . Abdominal hysterectomy    . Dilation and curettage of uterus    . Cesarean section  1969    only 4th of 4 children was c-section  . Av fistula placement  1970's    LFA  . Bladder removal  1974    with ileal conduit and urostomy   Family History  Problem Relation Age of Onset  . Heart attack Mother   . Healthy Daughter   . Healthy Daughter   . Healthy Son    History  Substance Use Topics  . Smoking status: Former Smoker -- 2  years    Types: Cigarettes    Quit date: 06/25/2004  . Smokeless tobacco: Never Used     Comment: 09/15/2013  "just smoked one cigarette, once in awhile for ~ 2 years"  . Alcohol Use: No   OB History   Grav Para Term Preterm Abortions TAB SAB Ect Mult Living                 Review of Systems  All other systems reviewed and are negative.   10 Systems reviewed and are negative for acute change except as noted in the HPI.   Allergies  Demerol and Ketorolac tromethamine  Home Medications   Prior to Admission medications   Medication Sig Start Date End Date Taking? Authorizing Provider  ALPRAZolam Prudy Feeler) 0.5 MG tablet Take 0.5 mg by mouth 3 (three) times daily.    Historical Provider, MD  amLODipine (NORVASC) 5 MG tablet Take 1 tablet (5 mg total) by mouth daily. 03/03/14   Vesta Mixer, MD  aspirin 81 MG tablet Take 81 mg by mouth daily.    Historical Provider, MD  calcitRIOL (ROCALTROL) 0.5 MCG capsule Take 1 capsule (0.5 mcg total) by mouth daily. 09/18/13   Shanker Levora Dredge, MD  calcium citrate-vitamin D 200-200 MG-UNIT TABS Take 1 tablet by mouth daily.      Historical Provider, MD  carbidopa-levodopa (SINEMET IR) 25-100 MG per tablet Take 1 tablet by mouth 3 (three) times daily. 02/11/14   Jeralyn Bennett, MD  carvedilol (COREG) 6.25 MG tablet Take 1 tablet (6.25 mg total) by mouth 2 (two) times daily with a meal. 02/08/14   Jeralyn Bennett, MD  ciprofloxacin (CIPRO) 500 MG tablet Take 1 tablet (500 mg total) by mouth every morning. 03/19/14   Donnetta Hutching, MD  citalopram (CELEXA) 20 MG tablet Take 20 mg by mouth daily.    Historical Provider, MD  diltiazem (CARDIZEM CD) 120 MG 24 hr capsule Take 1 capsule (120 mg total) by mouth daily. 02/08/14   Jeralyn Bennett, MD  donepezil (ARICEPT) 10 MG tablet Take 10 mg by mouth every evening.    Historical Provider, MD  feeding supplement, RESOURCE BREEZE, (RESOURCE BREEZE) LIQD Take 1 Container by mouth 3 (three) times daily between meals.  02/08/14   Jeralyn Bennett, MD  Fluticasone Furoate-Vilanterol (  BREO ELLIPTA) 100-25 MCG/INH AEPB Inhale 1 puff into the lungs daily.    Historical Provider, MD  furosemide (LASIX) 40 MG tablet Take 40 mg by mouth 2 (two) times daily. TAKES 1 TAB IN THE AM  AND 1 TAB BEFORE 5PM     Historical Provider, MD  Ipratropium-Albuterol (COMBIVENT RESPIMAT) 20-100 MCG/ACT AERS respimat Inhale 1 puff into the lungs 4 (four) times daily.    Historical Provider, MD  mirtazapine (REMERON) 15 MG tablet Take 1 tablet (15 mg total) by mouth at bedtime. 09/18/13   Shanker Levora Dredge, MD  Multiple Vitamin (MULTIVITAMIN) tablet Take 1 tablet by mouth daily.      Historical Provider, MD  nitroGLYCERIN (NITROSTAT) 0.4 MG SL tablet Place 1 tablet (0.4 mg total) under the tongue every 5 (five) minutes as needed. For chest pain 06/16/12   Vesta Mixer, MD  Omega-3 Fatty Acids (FISH OIL) 1000 MG CAPS Take by mouth daily.    Historical Provider, MD  pantoprazole (PROTONIX) 40 MG tablet Take 40 mg by mouth as needed.    Historical Provider, MD  sodium bicarbonate 650 MG tablet Take 650 mg by mouth 3 (three) times daily.     Historical Provider, MD  traMADol (ULTRAM) 50 MG tablet Take 1 tablet (50 mg total) by mouth every 12 (twelve) hours as needed for severe pain. 02/08/14   Jeralyn Bennett, MD   BP 116/60  Pulse 50  Temp(Src) 98.3 F (36.8 C) (Oral)  Resp 14  Ht 5' 5.5" (1.664 m)  Wt 113 lb (51.256 kg)  BMI 18.51 kg/m2  SpO2 98% Physical Exam  Nursing note and vitals reviewed. Constitutional: She is oriented to person, place, and time.  Patient is lucid, drinking fluids, not clinically dehydrated  HENT:  Head: Normocephalic and atraumatic.  Eyes: Conjunctivae and EOM are normal. Pupils are equal, round, and reactive to light.  Neck: Normal range of motion. Neck supple.  Cardiovascular: Normal rate, regular rhythm and normal heart sounds.   Pulmonary/Chest: Effort normal and breath sounds normal.   Abdominal: Soft. Bowel sounds are normal.  Musculoskeletal: Normal range of motion.  Neurological: She is alert and oriented to person, place, and time.  Skin: Skin is warm and dry.  Psychiatric: She has a normal mood and affect. Her behavior is normal.    ED Course  Procedures  DIAGNOSTIC STUDIES: Oxygen Saturation is 98% which is normal  COORDINATION OF CARE: 3:47 PM-Discussed treatment plan which includes urinalysis   Labs Review Labs Reviewed  URINALYSIS, ROUTINE W REFLEX MICROSCOPIC - Abnormal; Notable for the following:    Specific Gravity, Urine <1.005 (*)    Nitrite POSITIVE (*)    Leukocytes, UA MODERATE (*)    All other components within normal limits  URINE MICROSCOPIC-ADD ON - Abnormal; Notable for the following:    Squamous Epithelial / LPF FEW (*)    Bacteria, UA FEW (*)    All other components within normal limits  URINE CULTURE    Imaging Review No results found.   EKG Interpretation None      MDM   Final diagnoses:  UTI (lower urinary tract infection)    We'll treat for urinary tract infection. I discussed the appropriate dosing with the pharmacist at [facility]  Recommendation was for Cipro 500 mg daily.  Patient is alert and not clinically dehydrated. She has primary care followup  I personally performed the services described in this documentation, which was scribed in my presence. The recorded information has  been reviewed and is accurate.    Donnetta Hutching, MD 03/20/14 [phone]

## 2014-03-19 NOTE — Discharge Instructions (Signed)
1 antibiotic tablet daily for one week. Recommend repeat urinalysis at that time.  Increase fluids. Followup your primary care Dr.

## 2014-03-19 NOTE — ED Notes (Signed)
Sent by Dr. Gerilyn Pilgrim for dehydration and UTI, family member states pt needs IV fluids and IV antibiotics

## 2014-03-23 LAB — URINE CULTURE: Colony Count: >=100000

## 2014-03-24 ENCOUNTER — Telehealth (HOSPITAL_COMMUNITY): Payer: Self-pay | Admitting: *Deleted

## 2014-03-24 NOTE — ED Notes (Signed)
(+)  urine culture, treated with Cipro, OK per Enzo BiNathan Batchelder

## 2014-04-20 ENCOUNTER — Ambulatory Visit (INDEPENDENT_AMBULATORY_CARE_PROVIDER_SITE_OTHER): Payer: Self-pay | Admitting: Internal Medicine

## 2014-05-25 DEATH — deceased

## 2014-05-28 ENCOUNTER — Telehealth: Payer: Self-pay

## 2014-05-28 NOTE — Telephone Encounter (Signed)
Patient died @ [facility] per Obituary °

## 2015-08-21 IMAGING — CT CT HEAD W/O CM
1 of 2 series · 13 of 30 positions shown, 17 images · non-contrast
Comparison: CT HEAD W/O CM dated 07/26/2013

CLINICAL DATA: Altered mental status.  Confusion.

EXAM:
CT HEAD WITHOUT CONTRAST
TECHNIQUE: Contiguous axial images were obtained from the base of the skull
through the vertex without intravenous contrast.

[Series 2: head 5.0 h30s · axial · 0.46mm/px · z∈[+1201,+1326]mm · 13 of 31 slices shown, 17 images]
[im 3/31  brain]
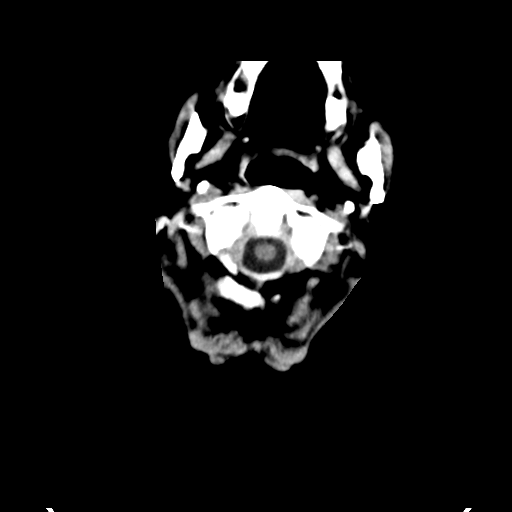
[im 3/31  bone]
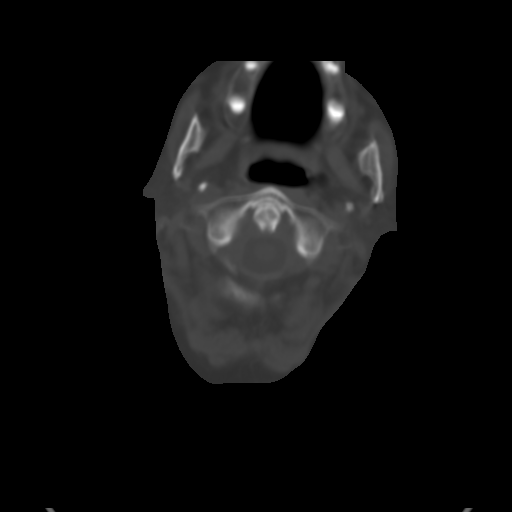
[im 5/31  brain]
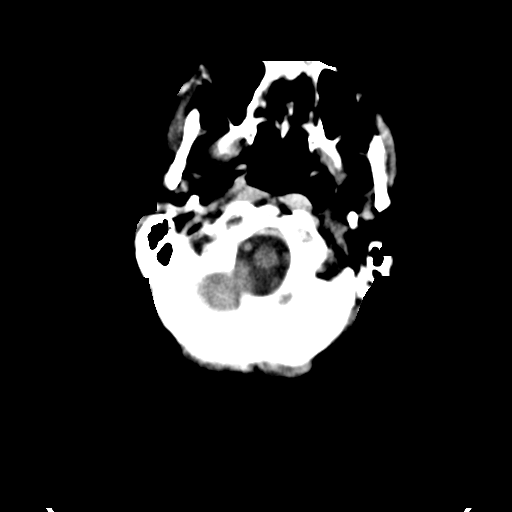
[im 7/31  brain]
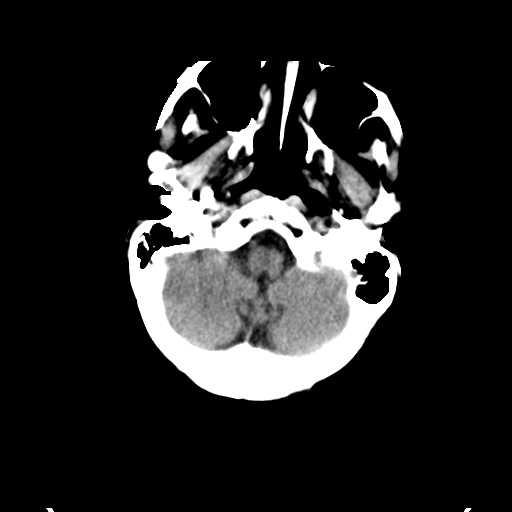
[im 9/31  brain]
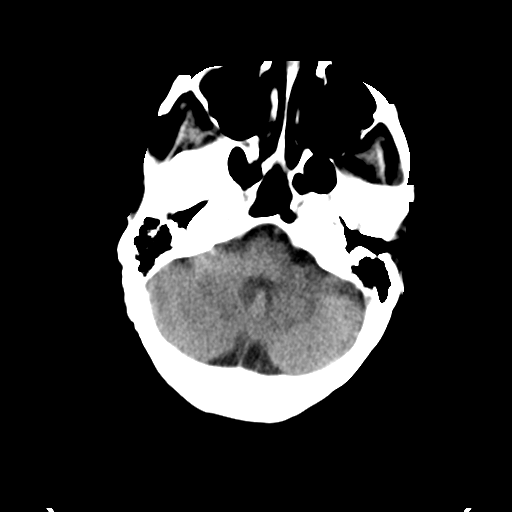
[im 11/31  brain]
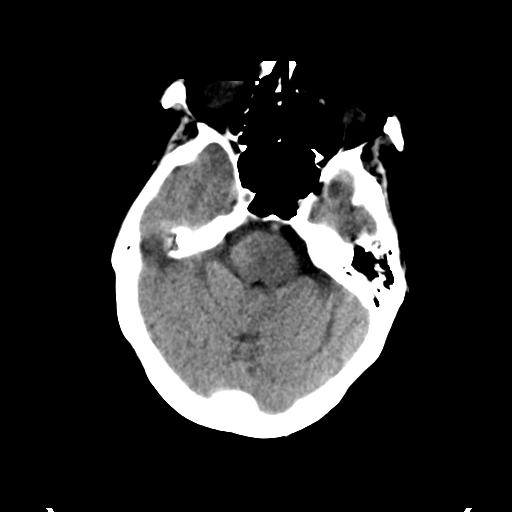
[im 11/31  bone]
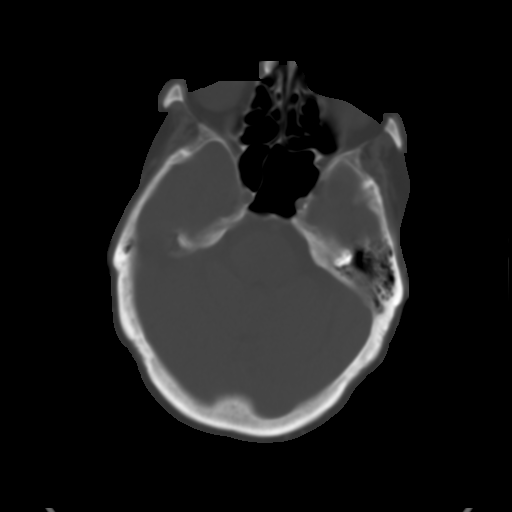
[im 13/31  brain]
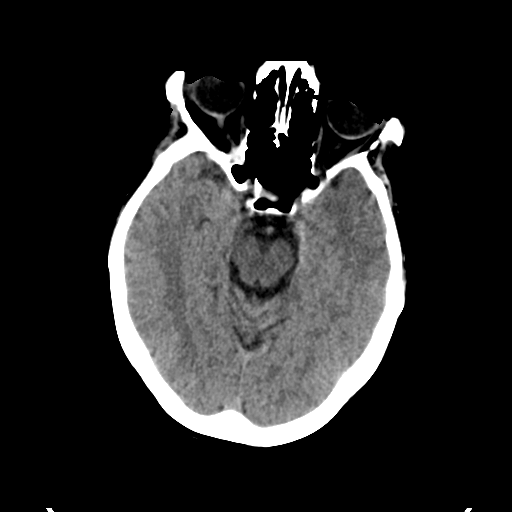
[im 16/31  brain]
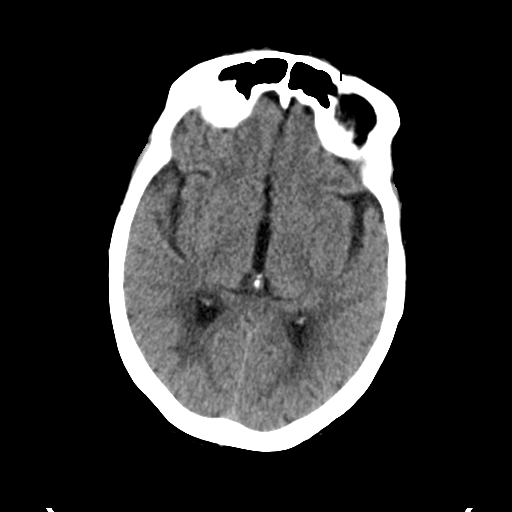
[im 18/31  brain]
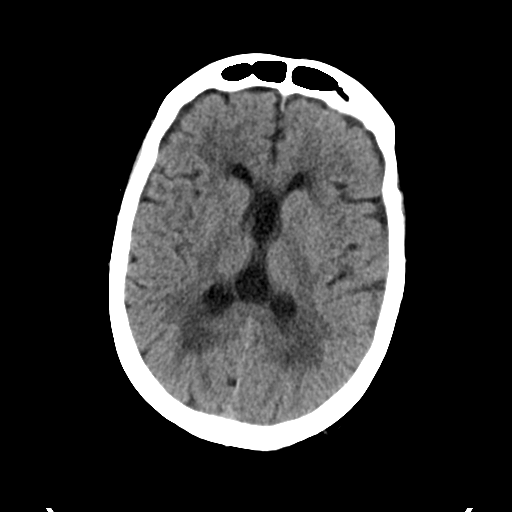
[im 20/31  brain]
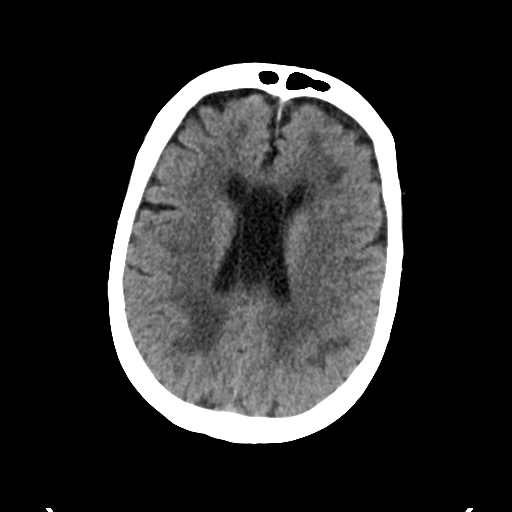
[im 20/31  bone]
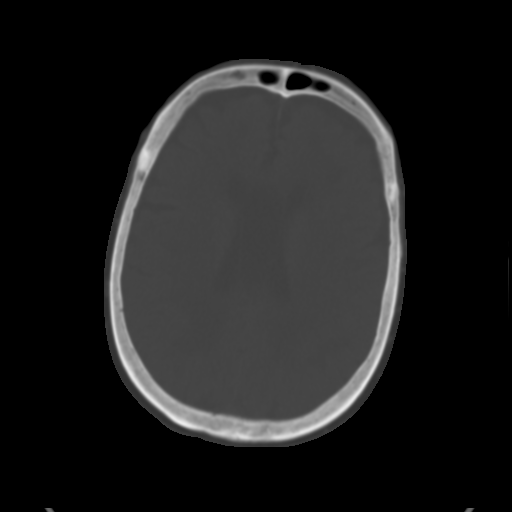
[im 22/31  brain]
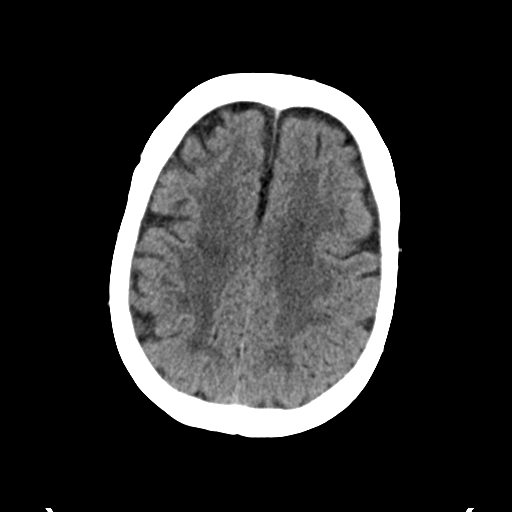
[im 24/31  brain]
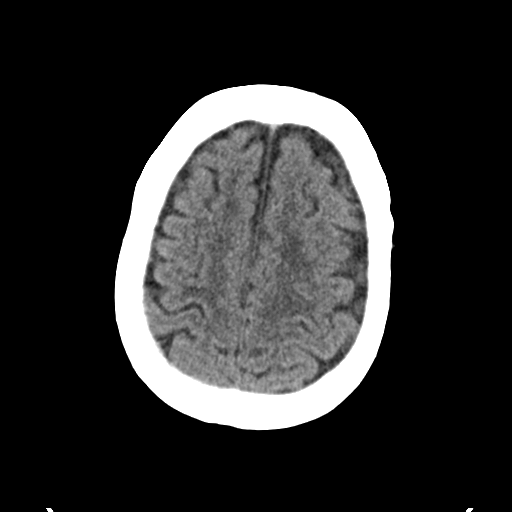
[im 26/31  brain]
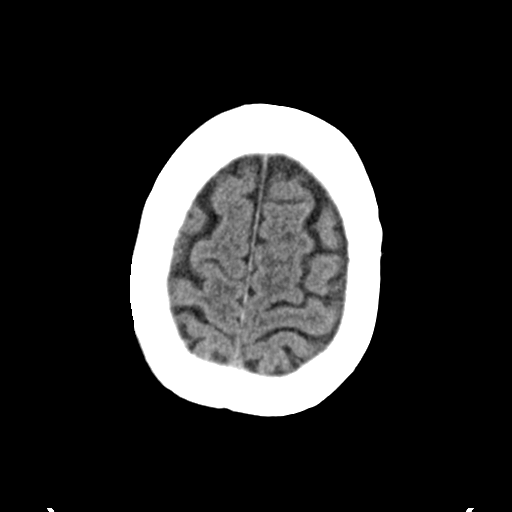
[im 28/31  brain]
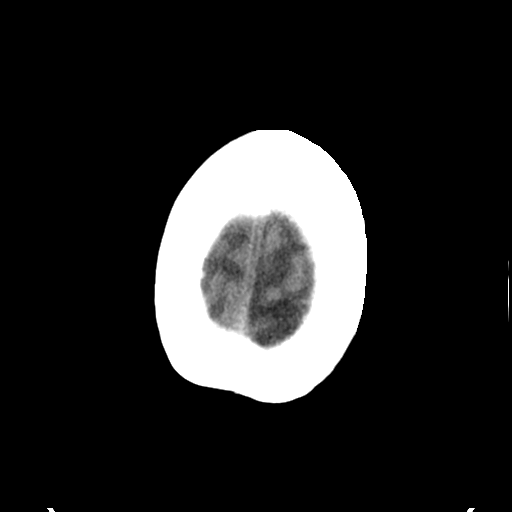
[im 28/31  bone]
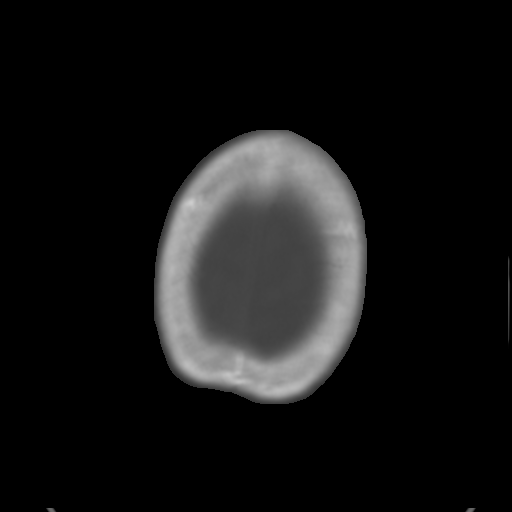

[13 of 30 positions shown; findings below may reference images not displayed]

FINDINGS: Mild atrophy and diffuse white matter disease is similar to the
prior exams. No acute cortical infarct, hemorrhage, or mass lesion
is present. Note is made of a persistent cavum septum pellucidum.
The ventricles are otherwise of normal size. No significant
extra-axial fluid collection is present. The paranasal sinuses and
mastoid air cells are clear.
IMPRESSION: 1. Stable atrophy and white matter disease.
2. No acute intracranial abnormality or significant interval change.
